# Patient Record
Sex: Female | Born: 1980 | Race: Black or African American | Hispanic: No | Marital: Single | Smoking: Never smoker
Health system: Southern US, Community
[De-identification: ages and names within clinical notes are randomized; demographics above are authoritative.]

## PROBLEM LIST (undated history)

## (undated) DIAGNOSIS — C19 Malignant neoplasm of rectosigmoid junction: Secondary | ICD-10-CM

## (undated) DIAGNOSIS — Z808 Family history of malignant neoplasm of other organs or systems: Secondary | ICD-10-CM

## (undated) DIAGNOSIS — F32A Depression, unspecified: Secondary | ICD-10-CM

## (undated) DIAGNOSIS — Z9889 Other specified postprocedural states: Secondary | ICD-10-CM

## (undated) DIAGNOSIS — M719 Bursopathy, unspecified: Secondary | ICD-10-CM

## (undated) DIAGNOSIS — K219 Gastro-esophageal reflux disease without esophagitis: Secondary | ICD-10-CM

## (undated) DIAGNOSIS — Z800 Family history of malignant neoplasm of digestive organs: Secondary | ICD-10-CM

## (undated) DIAGNOSIS — Z803 Family history of malignant neoplasm of breast: Secondary | ICD-10-CM

## (undated) DIAGNOSIS — F419 Anxiety disorder, unspecified: Secondary | ICD-10-CM

## (undated) HISTORY — PX: LEEP: SHX91

## (undated) HISTORY — DX: Family history of malignant neoplasm of other organs or systems: Z80.8

## (undated) HISTORY — DX: Family history of malignant neoplasm of digestive organs: Z80.0

## (undated) HISTORY — DX: Family history of malignant neoplasm of breast: Z80.3

---
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
## 2004-12-06 ENCOUNTER — Emergency Department (HOSPITAL_COMMUNITY): Admission: EM | Admit: 2004-12-06 | Discharge: 2004-12-06 | Payer: Self-pay | Admitting: Emergency Medicine
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
## 2007-03-15 ENCOUNTER — Emergency Department (HOSPITAL_COMMUNITY): Admission: EM | Admit: 2007-03-15 | Discharge: 2007-03-15 | Payer: Self-pay | Admitting: Emergency Medicine

## 2017-10-18 DIAGNOSIS — L74510 Primary focal hyperhidrosis, axilla: Secondary | ICD-10-CM | POA: Diagnosis not present

## 2017-11-22 DIAGNOSIS — Z30432 Encounter for removal of intrauterine contraceptive device: Secondary | ICD-10-CM | POA: Diagnosis not present

## 2017-11-22 DIAGNOSIS — Z01419 Encounter for gynecological examination (general) (routine) without abnormal findings: Secondary | ICD-10-CM | POA: Diagnosis not present

## 2017-11-22 DIAGNOSIS — N898 Other specified noninflammatory disorders of vagina: Secondary | ICD-10-CM | POA: Diagnosis not present

## 2017-11-22 DIAGNOSIS — Z1151 Encounter for screening for human papillomavirus (HPV): Secondary | ICD-10-CM | POA: Diagnosis not present

## 2017-11-22 DIAGNOSIS — Z6829 Body mass index (BMI) 29.0-29.9, adult: Secondary | ICD-10-CM | POA: Diagnosis not present

## 2018-05-23 ENCOUNTER — Encounter (HOSPITAL_COMMUNITY): Payer: Self-pay

## 2018-05-23 ENCOUNTER — Other Ambulatory Visit: Payer: Self-pay

## 2018-05-23 ENCOUNTER — Ambulatory Visit (HOSPITAL_COMMUNITY)
Admission: EM | Admit: 2018-05-23 | Discharge: 2018-05-23 | Disposition: A | Discharge status: Home / Self Care | Payer: 59

## 2018-05-23 DIAGNOSIS — J302 Other seasonal allergic rhinitis: Secondary | ICD-10-CM

## 2018-05-23 DIAGNOSIS — H6122 Impacted cerumen, left ear: Secondary | ICD-10-CM | POA: Diagnosis not present

## 2018-05-23 NOTE — ED Triage Notes (Signed)
Patient presents to Urgent Care with complaints of ear fullness since about a week ago. Patient states she usually gets bad sinus infections, has not taken meds for symptoms.

## 2018-05-23 NOTE — ED Provider Notes (Signed)
[place]    CSN: [id number] Arrival date & time: 05/23/18  1642     History   Chief Complaint Chief Complaint  Patient presents with  . Appointment  . Ear Fullness    HPI Sharon Bennett is a 38 y.o. female.   HPI Patient presents today with a complaint of bilateral ear fullness and ear pressure greater in right ear compared to left. She has a history of seasonal allergies and in the past has taken Zyrtec and Allegra. Currently, she is not currently taking antihistamines for allergy symptoms. Denies headache, facial pressure, nasal drainage, fever, or feeling poorly.  History reviewed. No pertinent past medical history.  There are no active problems to display for this patient.  History reviewed. No pertinent surgical history.  OB History   No obstetric history on file.      Home Medications    Prior to Admission medications   Medication Sig Start Date End Date Taking? Authorizing Provider  norethindrone-ethinyl estradiol (LOESTRIN FE) 1-20 MG-MCG tablet Take 1 tablet by mouth daily.   Yes [provider]    Family History Family History  Problem Relation Age of Onset  . Hypertension Mother   . Heart failure Father   . Hypertension Father     Social History Social History   Tobacco Use  . Smoking status: Never Smoker  . Smokeless tobacco: Never Used  Substance Use Topics  . Alcohol use: Not on file    Comment: occasionally  . Drug use: Not on file     Allergies   Patient has no known allergies.   Review of Systems Review of Systems Pertinent negatives listed in HPI Physical Exam Triage Vital Signs ED Triage Vitals  Enc Vitals Group     BP 05/23/18 1720 134/86     Pulse Rate 05/23/18 1720 83     Resp 05/23/18 1720 17     Temp 05/23/18 1720 98.5 F (36.9 C)     Temp Source 05/23/18 1720 Oral     SpO2 05/23/18 1720 100 %     Weight --      Height --      Head Circumference --      Peak Flow --      Pain Score  05/23/18 1716 0     Pain Loc --      Pain Edu? --      Excl. in [place]? --    No data found.  Updated Vital Signs BP 134/86 (BP Location: Left Arm)   Pulse 83   Temp 98.5 F (36.9 C) (Oral)   Resp 17   SpO2 100%   Visual Acuity Right Eye Distance:   Left Eye Distance:   Bilateral Distance:    Right Eye Near:   Left Eye Near:    Bilateral Near:     Physical Exam Vitals signs reviewed.  Constitutional:      Appearance: Normal appearance.  HENT:     Right Ear: Hearing, ear canal and external ear normal. A middle ear effusion is present.     Left Ear: Hearing and external ear normal. There is impacted cerumen.  Cardiovascular:     Rate and Rhythm: Normal rate and regular rhythm.  Pulmonary:     Effort: Pulmonary effort is normal.     Breath sounds: Normal breath sounds.  Musculoskeletal: Normal range of motion.  Psychiatric:        Mood and Affect: Mood normal.  Behavior: Behavior is cooperative.    UC Treatments / Results  Labs (all labs ordered are listed, but only abnormal results are displayed) Labs Reviewed - No data to display  EKG None  Radiology No results found.  Procedures Procedures (including critical care time)  Medications Ordered in UC Medications - No data to display  Initial Impression / Assessment and Plan / UC Course  I have reviewed the triage vital signs and the nursing notes.  Pertinent labs & imaging results that were available during my care of the patient were reviewed by me and considered in my medical decision making (see chart for details).    Left ear cerumen impaction. Cerumen irrigation performed and patient tolerated without issue and reports resolution of symptoms. No evidence of infection of the left ear. Patient encouraged to resume oral antihistamines for management of allergy related symptoms. Final Clinical Impressions(s) / UC Diagnoses   Final diagnoses:  Impacted cerumen of left ear  Seasonal allergies      Discharge Instructions     Resume daily antihistamine therapy of your choice to reduce symptoms of ear pressure.      ED Prescriptions    None     Controlled Substance Prescriptions [place] Controlled Substance Registry consulted? Not Applicable   Scot Jun, Harold 05/23/18 [phone]

## 2018-05-23 NOTE — Discharge Instructions (Addendum)
Resume daily antihistamine therapy of your choice to reduce symptoms of ear pressure.

## 2018-11-17 DIAGNOSIS — Z01419 Encounter for gynecological examination (general) (routine) without abnormal findings: Secondary | ICD-10-CM | POA: Diagnosis not present

## 2018-11-17 DIAGNOSIS — Z0000 Encounter for general adult medical examination without abnormal findings: Secondary | ICD-10-CM | POA: Diagnosis not present

## 2018-11-17 DIAGNOSIS — Z1329 Encounter for screening for other suspected endocrine disorder: Secondary | ICD-10-CM | POA: Diagnosis not present

## 2018-11-17 DIAGNOSIS — N72 Inflammatory disease of cervix uteri: Secondary | ICD-10-CM | POA: Diagnosis not present

## 2018-11-17 DIAGNOSIS — Z131 Encounter for screening for diabetes mellitus: Secondary | ICD-10-CM | POA: Diagnosis not present

## 2018-11-17 DIAGNOSIS — B977 Papillomavirus as the cause of diseases classified elsewhere: Secondary | ICD-10-CM | POA: Diagnosis not present

## 2018-11-17 DIAGNOSIS — Z13220 Encounter for screening for lipoid disorders: Secondary | ICD-10-CM | POA: Diagnosis not present

## 2018-11-17 DIAGNOSIS — Z130 Encounter for screening for diseases of the blood and blood-forming organs and certain disorders involving the immune mechanism: Secondary | ICD-10-CM | POA: Diagnosis not present

## 2018-11-17 DIAGNOSIS — Z6825 Body mass index (BMI) 25.0-25.9, adult: Secondary | ICD-10-CM | POA: Diagnosis not present

## 2019-01-03 ENCOUNTER — Other Ambulatory Visit: Payer: Self-pay

## 2019-01-03 ENCOUNTER — Ambulatory Visit (HOSPITAL_COMMUNITY)
Admission: EM | Admit: 2019-01-03 | Discharge: 2019-01-03 | Disposition: A | Discharge status: Home / Self Care | Payer: 59

## 2019-01-03 ENCOUNTER — Encounter (HOSPITAL_COMMUNITY): Payer: Self-pay

## 2019-01-03 DIAGNOSIS — T300 Burn of unspecified body region, unspecified degree: Secondary | ICD-10-CM | POA: Diagnosis not present

## 2019-01-03 MED ORDER — SILVER SULFADIAZINE 1 % EX CREA
TOPICAL_CREAM | CUTANEOUS | Status: AC
  Filled 2019-01-03: qty 85

## 2019-01-03 MED ORDER — SILVER SULFADIAZINE 1 % EX CREA
TOPICAL_CREAM | Freq: Once | CUTANEOUS | Status: AC
  Administered 2019-01-03: 16:00:00 via TOPICAL

## 2019-01-03 MED ORDER — SILVER SULFADIAZINE 1 % EX CREA: 1.0000 "application " | TOPICAL_CREAM | Freq: Two times a day (BID) | CUTANEOUS | 0 refills | Status: DC
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
## 2019-01-03 NOTE — Discharge Instructions (Addendum)
Thin amount of silvadene twice a day.  Wash with gentle soap and water daily, skin will eventually slough off which will be good for this as well.  Likely will take a few weeks for this to heal.  Please return for any concerns of infection or any worsening of symptoms.  Follow up with plastics as needed.
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
## 2019-06-02 MED FILL — LARIN 21 1-20 TABLET: 1-20 | 84 days supply | Qty: 84 | Fill #0

## 2019-08-24 MED FILL — LARIN 21 1-20 TABLET: 1-20 | 42 days supply | Qty: 42 | Fill #1

## 2019-09-02 ENCOUNTER — Other Ambulatory Visit: Payer: Self-pay

## 2019-09-02 ENCOUNTER — Encounter (HOSPITAL_COMMUNITY): Payer: Self-pay

## 2019-09-02 ENCOUNTER — Ambulatory Visit (HOSPITAL_COMMUNITY)
Admission: RE | Admit: 2019-09-02 | Discharge: 2019-09-02 | Disposition: A | Discharge status: Home / Self Care | Payer: No Typology Code available for payment source | Source: Ambulatory Visit | Attending: Family Medicine | Admitting: Family Medicine

## 2019-09-02 VITALS — BP 138/78 | HR 81 | Temp 98.9°F | Resp 16

## 2019-09-02 DIAGNOSIS — L509 Urticaria, unspecified: Secondary | ICD-10-CM | POA: Diagnosis not present

## 2019-09-02 MED ORDER — PREDNISONE 10 MG (21) PO TBPK: ORAL_TABLET | Freq: Every day | ORAL | 0 refills | Status: DC

## 2019-09-02 MED FILL — predniSONE 10 MG TABS: 10 | 6 days supply | Qty: 21 | Fill #0

## 2019-09-02 NOTE — ED Provider Notes (Signed)
  [facility]   [id number] 09/02/19 Arrival Time: [place]:  1. Urticaria     Unclear trigger.  Begin: Meds ordered this encounter  Medications  . predniSONE (STERAPRED UNI-PAK 21 TAB) 10 MG (21) TBPK tablet    Sig: Take by mouth daily. Take as directed.    Dispense:  21 tablet    Refill:  0    Benadryl if needed.  Will follow up with PCP or here if worsening or failing to improve as anticipated. Reviewed expectations re: course of current medical issues. Questions answered. Outlined signs and symptoms indicating need for more acute intervention. Patient verbalized understanding. After Visit Summary given.   SUBJECTIVE:  Sharon Bennett is a 39 y.o. female who reports hives; abrupt onset 2 d ago; diffuse; significant itching; afebrile. H/O similar in past. Unknown trigger.  No resp or swallowing difficulties.   OBJECTIVE: Vitals:   09/02/19 1626  BP: (!) 138/78  Pulse: 81  Resp: 16  Temp: 98.9 F (37.2 C)  TempSrc: Oral  SpO2: 100%    General appearance: alert; no distress HEENT: [place]; AT Neck: supple with FROM Lungs: unlabored Extremities: no edema; moves all extremities normally Skin: warm and dry; smooth, slightly elevated and erythematous plaques of variable size over her face, trunk, and extremities Psychological: alert and cooperative; normal mood and affect  No Known Allergies  History reviewed. No pertinent past medical history. Social History   Socioeconomic History  . Marital status: Single    Spouse name: Not on file  . Number of children: Not on file  . Years of education: Not on file  . Highest education level: Not on file  Occupational History  . Not on file  Tobacco Use  . Smoking status: Never Smoker  . Smokeless tobacco: Never Used  Vaping Use  . Vaping Use: Never used  Substance and Sexual Activity  . Alcohol use: Not on file    Comment: occasionally  . Drug use: Not on file  . Sexual activity: Not on  file  Other Topics Concern  . Not on file  Social History Narrative  . Not on file   Social Determinants of Health   Financial Resource Strain:   . Difficulty of Paying Living Expenses:   Food Insecurity:   . Worried About Charity fundraiser in the Last Year:   . Arboriculturist in the Last Year:   Transportation Needs:   . Film/video editor (Medical):   Marland Kitchen Lack of Transportation (Non-Medical):   Physical Activity:   . Days of Exercise per Week:   . Minutes of Exercise per Session:   Stress:   . Feeling of Stress :   Social Connections:   . Frequency of Communication with Friends and Family:   . Frequency of Social Gatherings with Friends and Family:   . Attends Religious Services:   . Active Member of Clubs or Organizations:   . Attends Archivist Meetings:   Marland Kitchen Marital Status:   Intimate Partner Violence:   . Fear of Current or Ex-Partner:   . Emotionally Abused:   Marland Kitchen Physically Abused:   . Sexually Abused:    Family History  Problem Relation Age of Onset  . Hypertension Mother   . Heart failure Father   . Hypertension Father    History reviewed. No pertinent surgical history.   Vanessa Kick, MD 09/02/19 1659

## 2019-09-02 NOTE — ED Triage Notes (Signed)
Pt presents with hives x 2 days. Reports she is not sure what she is having a reaction to. Reports history of same with relief with prednisone. Hives are on her arms, back, stomach, legs, feet, hands and face.

## 2019-10-13 MED FILL — LARIN 21 1-20 TABLET: 1-20 | 63 days supply | Qty: 63 | Fill #0

## 2019-11-23 ENCOUNTER — Other Ambulatory Visit (HOSPITAL_COMMUNITY): Payer: Self-pay | Admitting: Obstetrics and Gynecology

## 2019-12-02 MED FILL — JUNEL 1-20 TABLET: 1-20 | 84 days supply | Qty: 84 | Fill #0

## 2020-02-07 ENCOUNTER — Ambulatory Visit (HOSPITAL_COMMUNITY): Payer: Self-pay

## 2020-08-21 ENCOUNTER — Emergency Department (HOSPITAL_BASED_OUTPATIENT_CLINIC_OR_DEPARTMENT_OTHER): Payer: 59

## 2020-08-21 ENCOUNTER — Encounter (HOSPITAL_BASED_OUTPATIENT_CLINIC_OR_DEPARTMENT_OTHER): Payer: Self-pay | Admitting: Emergency Medicine

## 2020-08-21 ENCOUNTER — Emergency Department (HOSPITAL_BASED_OUTPATIENT_CLINIC_OR_DEPARTMENT_OTHER)
Admission: EM | Admit: 2020-08-21 | Discharge: 2020-08-21 | Disposition: A | Discharge status: Home / Self Care | Payer: 59 | Attending: Emergency Medicine | Admitting: Emergency Medicine

## 2020-08-21 ENCOUNTER — Other Ambulatory Visit: Payer: Self-pay

## 2020-08-21 DIAGNOSIS — N200 Calculus of kidney: Secondary | ICD-10-CM | POA: Diagnosis not present

## 2020-08-21 DIAGNOSIS — R519 Headache, unspecified: Secondary | ICD-10-CM | POA: Diagnosis not present

## 2020-08-21 DIAGNOSIS — K6289 Other specified diseases of anus and rectum: Secondary | ICD-10-CM | POA: Insufficient documentation

## 2020-08-21 DIAGNOSIS — K625 Hemorrhage of anus and rectum: Secondary | ICD-10-CM | POA: Diagnosis not present

## 2020-08-21 DIAGNOSIS — D259 Leiomyoma of uterus, unspecified: Secondary | ICD-10-CM | POA: Diagnosis not present

## 2020-08-21 LAB — URINALYSIS, ROUTINE W REFLEX MICROSCOPIC
Bilirubin Urine: NEGATIVE
Glucose, UA: NEGATIVE mg/dL
Ketones, ur: 15 mg/dL — AB
Nitrite: NEGATIVE
Protein, ur: 30 mg/dL — AB
Specific Gravity, Urine: 1.025 (ref 1.005–1.030)
pH: 6.5 (ref 5.0–8.0)

## 2020-08-21 LAB — COMPREHENSIVE METABOLIC PANEL
ALT: 11 U/L (ref 0–44)
AST: 14 U/L — ABNORMAL LOW (ref 15–41)
Albumin: 4.5 g/dL (ref 3.5–5.0)
Alkaline Phosphatase: 60 U/L (ref 38–126)
Anion gap: 11 (ref 5–15)
BUN: 9 mg/dL (ref 6–20)
CO2: 25 mmol/L (ref 22–32)
Calcium: 8.9 mg/dL (ref 8.9–10.3)
Chloride: 100 mmol/L (ref 98–111)
Creatinine, Ser: 0.8 mg/dL (ref 0.44–1.00)
GFR, Estimated: >60 mL/min (ref >60)
Glucose, Bld: 81 mg/dL (ref 70–99)
Potassium: 3.6 mmol/L (ref 3.5–5.1)
Sodium: 136 mmol/L (ref 135–145)
Total Bilirubin: 0.4 mg/dL (ref 0.3–1.2)
Total Protein: 7.9 g/dL (ref 6.5–8.1)

## 2020-08-21 LAB — CBC WITH DIFFERENTIAL/PLATELET
Abs Immature Granulocytes: 0.01 10*3/uL (ref 0.00–0.07)
Basophils Absolute: 0 10*3/uL (ref 0.0–0.1)
Basophils Relative: 1 %
Eosinophils Absolute: 0.2 10*3/uL (ref 0.0–0.5)
Eosinophils Relative: 3 %
HCT: 41.9 % (ref 36.0–46.0)
Hemoglobin: 13.6 g/dL (ref 12.0–15.0)
Immature Granulocytes: 0 %
Lymphocytes Relative: 41 %
Lymphs Abs: 2.9 10*3/uL (ref 0.7–4.0)
MCH: 27.8 pg (ref 26.0–34.0)
MCHC: 32.5 g/dL (ref 30.0–36.0)
MCV: 85.5 fL (ref 80.0–100.0)
Monocytes Absolute: 0.7 10*3/uL (ref 0.1–1.0)
Monocytes Relative: 10 %
Neutro Abs: 3.3 10*3/uL (ref 1.7–7.7)
Neutrophils Relative %: 45 %
Platelets: 319 10*3/uL (ref 150–400)
RBC: 4.9 MIL/uL (ref 3.87–5.11)
RDW: 13.4 % (ref 11.5–15.5)
WBC: 7.2 10*3/uL (ref 4.0–10.5)
nRBC: 0 % (ref 0.0–0.2)

## 2020-08-21 LAB — PREGNANCY, URINE: Preg Test, Ur: NEGATIVE

## 2020-08-21 LAB — C DIFFICILE QUICK SCREEN W PCR REFLEX
C Diff antigen: NEGATIVE
C Diff interpretation: NOT DETECTED
C Diff toxin: NEGATIVE

## 2020-08-21 LAB — OCCULT BLOOD X 1 CARD TO LAB, STOOL: Fecal Occult Bld: POSITIVE — AB

## 2020-08-21 IMAGING — CT CT ABD-PELV W/ CM
2 of 5 series · 15 of 46 positions shown, 17 images · IV contrast (APPLIED)
Comparison: No priors.

CLINICAL DATA: 40-year-old female with bright red blood in stools.
Suspected diverticulitis.

EXAM:
CT ABDOMEN AND PELVIS WITH CONTRAST
TECHNIQUE: Multidetector CT imaging of the abdomen and pelvis was performed
using the standard protocol following bolus administration of
intravenous contrast.
CONTRAST:  75mL OMNIPAQUE IOHEXOL 300 MG/ML  SOLN

[Series 2: abd pel w · axial · 0.71mm/px · z∈[+845,+1190]mm · 12 of 79 slices shown, 14 images]
[im 5/79  soft-tissue]
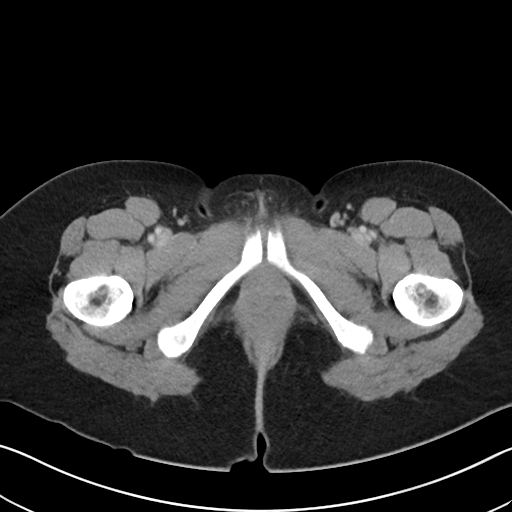
[im 5/79  bone]
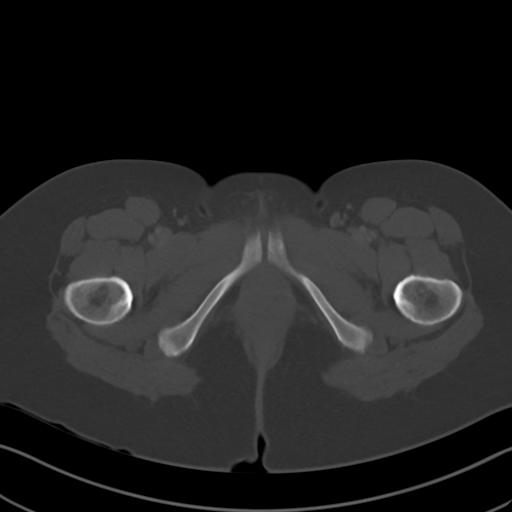
[im 13/79  soft-tissue]
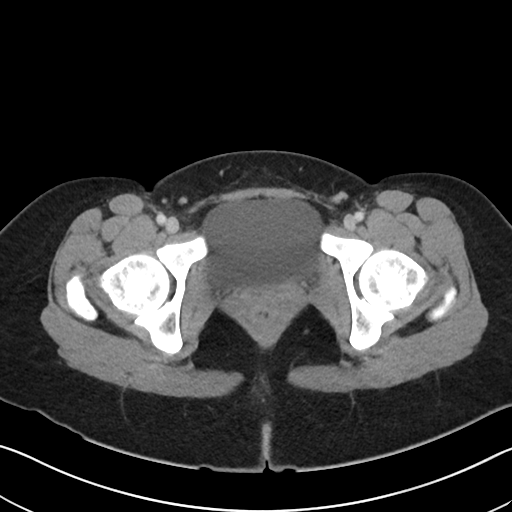
[im 17/79  soft-tissue]
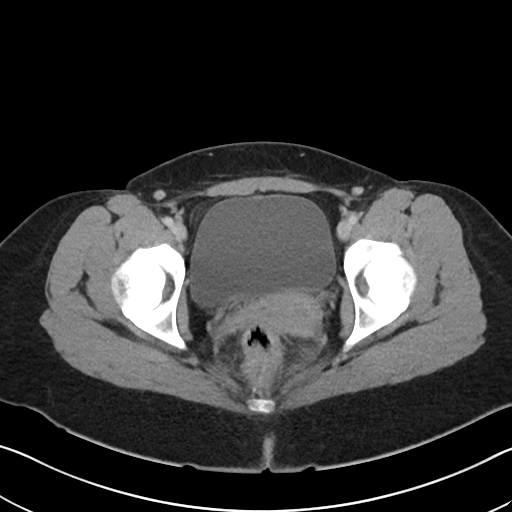
[im 25/79  soft-tissue]
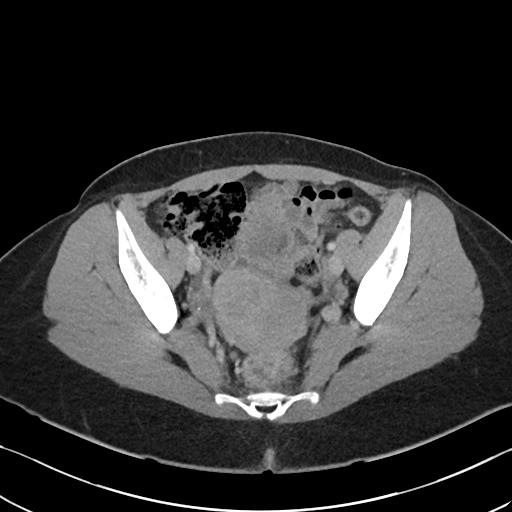
[im 29/79  soft-tissue]
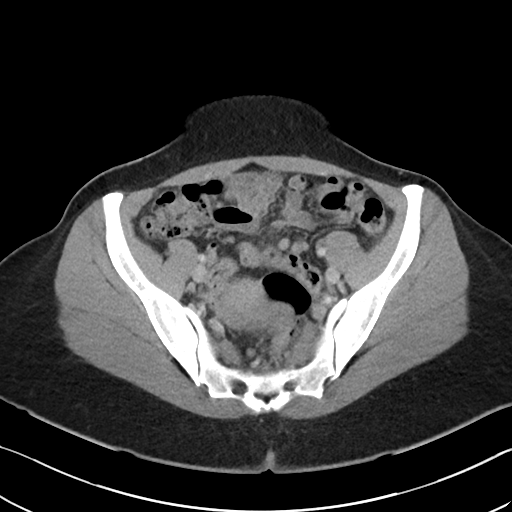
[im 37/79  soft-tissue]
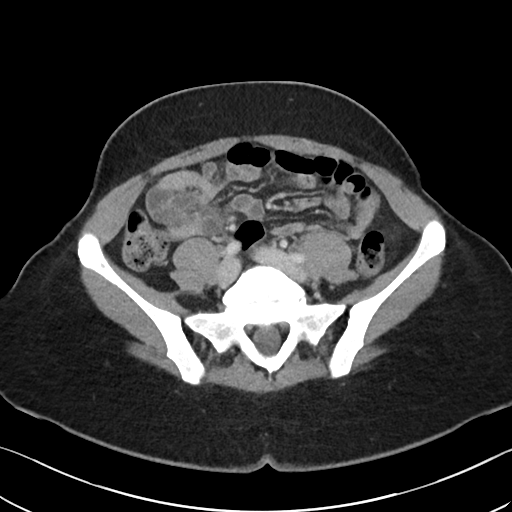
[im 42/79  soft-tissue]
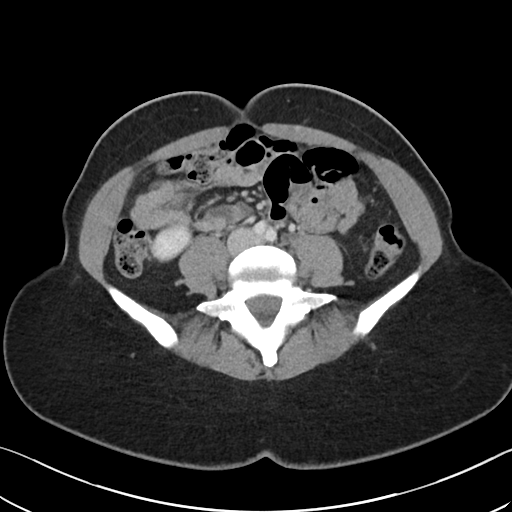
[im 50/79  soft-tissue]
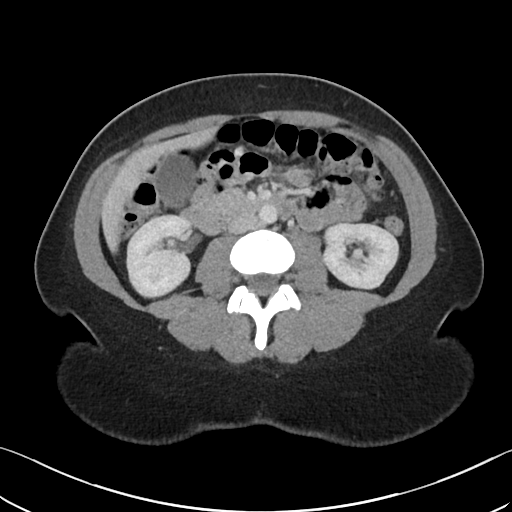
[im 54/79  soft-tissue]
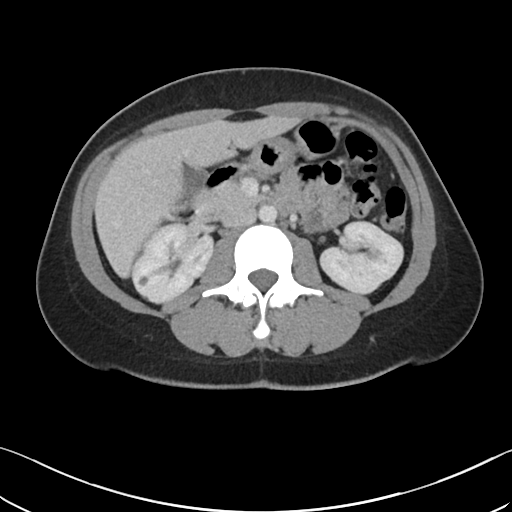
[im 54/79  bone]
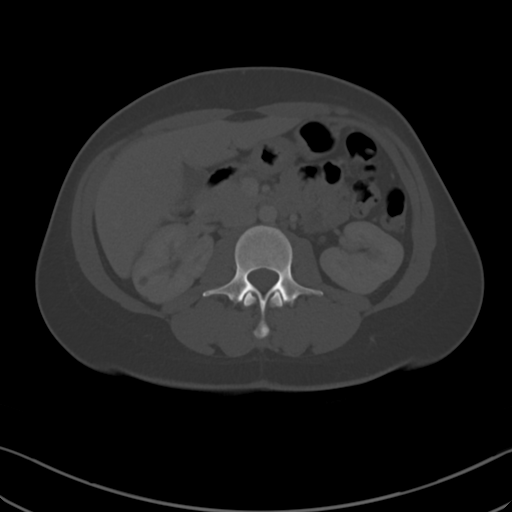
[im 62/79  soft-tissue]
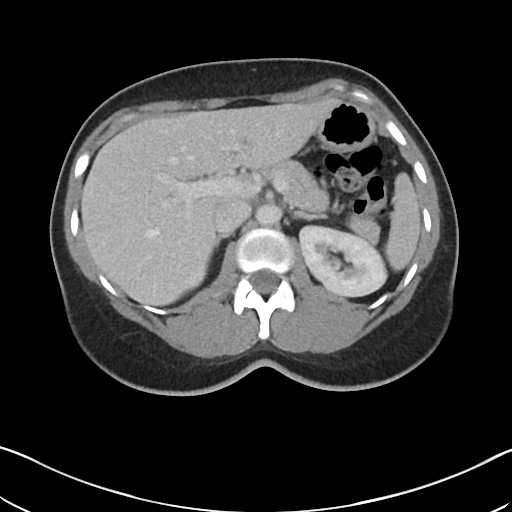
[im 66/79  soft-tissue]
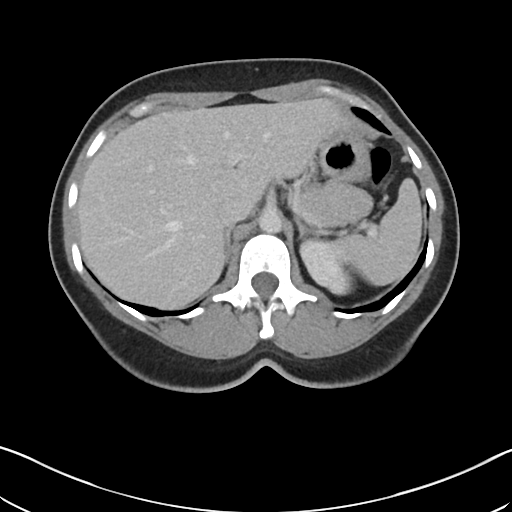
[im 74/79  soft-tissue]
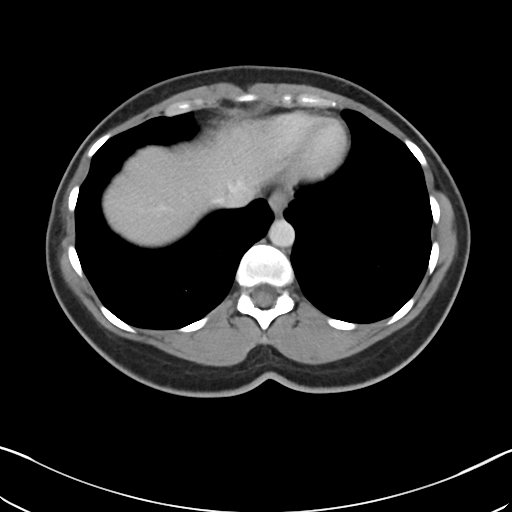

[Series 5: coronal · coronal · 0.77mm/px · 3 of 91 slices shown]
[im 31/91  soft-tissue]
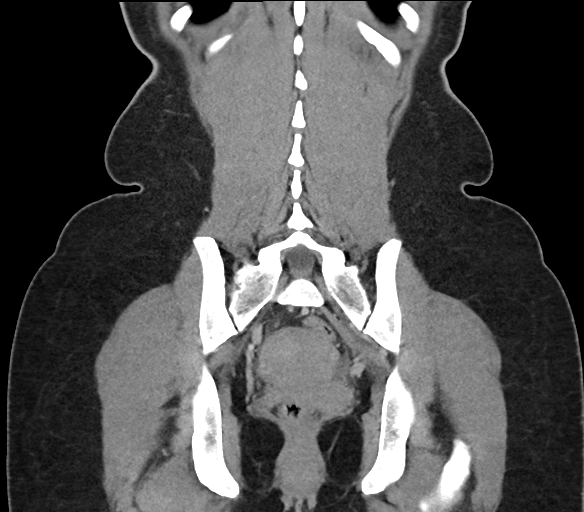
[im 41/91  soft-tissue]
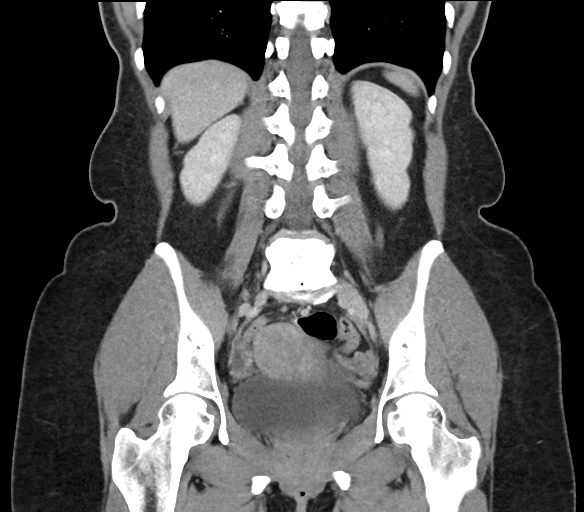
[im 51/91  soft-tissue]
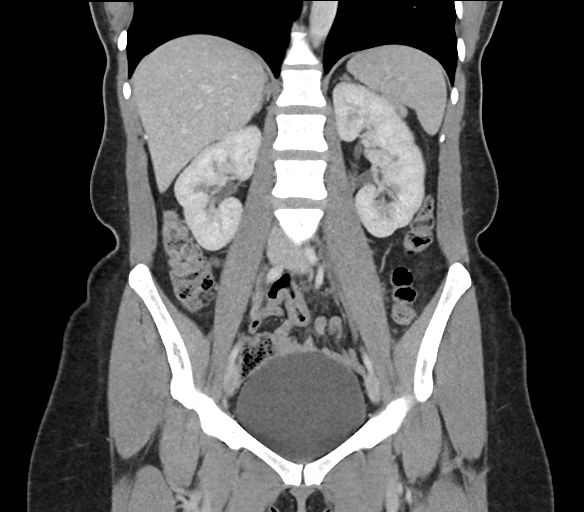

[15 of 46 positions shown; findings below may reference images not displayed]

FINDINGS: Lower chest: Unremarkable.

Hepatobiliary: No suspicious cystic or solid hepatic lesions. No
intra or extrahepatic biliary ductal dilatation. Gallbladder is
normal in appearance.

Pancreas: No pancreatic mass. No pancreatic ductal dilatation. No
pancreatic or peripancreatic fluid collections or inflammatory
changes.

Spleen: Unremarkable.

Adrenals/Urinary Tract: 2 mm nonobstructive calculus in the lower
pole collecting system of the right kidney. Subcentimeter
low-attenuation lesion in the upper pole of the right kidney, too
small to characterize, but statistically likely to represent a tiny
cyst. Left kidney and bilateral adrenal glands are normal in
appearance. No hydroureteronephrosis. Urinary bladder is normal in
appearance.

Stomach/Bowel: The appearance of the stomach is normal. There is no
pathologic dilatation of small bowel or colon. No significant
diverticular disease. Normal appendix. Large rectal mass estimated
to measure approximately 4.4 x 2.6 x 5.5 cm (axial image 60 of
series 2 and coronal image 23 of series 5). Haziness in the
mesorectal fat suggesting local infiltration of disease. Obscuration
of the fat plane between the mass and the adjacent posterior aspect
of the uterus. Numerous prominent mesorectal lymph nodes measuring
up to 5 mm in short axis, suspicious for nodal metastasis.

Vascular/Lymphatic: No significant atherosclerotic disease, aneurysm
or dissection noted in the abdominal or pelvic vasculature. As
mentioned above there are numerous prominent but nonenlarged
mesorectal lymph nodes suspicious for nodal metastasis given the
adjacent large rectal mass. No other lymphadenopathy noted elsewhere
in the abdomen or pelvis.

Reproductive: Uterus is heterogeneous in appearance with multiple
lesions, likely to represent small fibroids. Obscuration of the fat
plane between the posterior aspect of the uterine body and fundus
and the adjacent rectal mass. Ovaries are unremarkable in
appearance.

Other: No significant volume of ascites.  No pneumoperitoneum.

Musculoskeletal: There are no aggressive appearing lytic or blastic
lesions noted in the visualized portions of the skeleton.
IMPRESSION: 1. Large rectal mass estimated to measure approximately 4.4 x 2.6 x
5.5 cm highly suspicious for primary rectal neoplasm. There is
evidence of local invasion into the mesorectal soft tissues, the
lesion is intimately associated with the posterior wall of the
uterus with the intervening fat plane of completely obscured, and
there are several nonenlarged but suspicious mesorectal lymph nodes
concerning for local nodal metastases. Surgical consultation is
recommended.
2. 2 mm nonobstructive calculus in the lower pole collecting system
of the right kidney.
3. Fibroid uterus.

## 2020-08-21 MED ORDER — IOHEXOL 300 MG/ML  SOLN
75.0000 mL | Freq: Once | INTRAMUSCULAR | Status: AC | PRN
  Administered 2020-08-21: 75 mL via INTRAVENOUS

## 2020-08-21 MED ORDER — ACETAMINOPHEN 325 MG PO TABS
650.0000 mg | ORAL_TABLET | Freq: Once | ORAL | Status: AC
  Administered 2020-08-21: 650 mg via ORAL
  Filled 2020-08-21: qty 2

## 2020-08-21 NOTE — ED Provider Notes (Signed)
[facility] Provider Note   CSN: [id number] Arrival date & time: 08/21/20  0845     History Chief Complaint  Patient presents with   GI Problem    Sh[facility] is a 40 y.o. female.  She has no significant past medical history.  She said she struggles with constipation at times and uses MiraLAX intermittently.  She started feeling constipated a few weeks ago and noticed some blood when she passed stool.  Some of the bowl, mostly when she wiped.  She is now passing looser stool.  Feels like she is incompletely emptying.  She thinks maybe the stool was a little dark.  Has felt fatigued.  Little bit of a headache today and a low-grade temperature.  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  No cough shortness of breath urinary symptoms vaginal discharge.  She is on continuous contraception and she has noticed a few episodes of some spotting.  The history is provided by the patient.  GI Problem This is a new problem. The current episode started more than 1 week ago. The problem occurs daily. The problem has not changed since onset.Associated symptoms include abdominal pain (cramps intermittent) and headaches. Pertinent negatives include no chest pain and no shortness of breath. Nothing aggravates the symptoms. Relieved by: laxatives. She has tried rest for the symptoms. The treatment provided no relief.      History reviewed. No pertinent past medical history.  There are no problems to display for this patient.   History reviewed. No pertinent surgical history.   OB History   No obstetric history on file.     Family History  Problem Relation Age of Onset   Hypertension Mother    Heart failure Father    Hypertension Father     Social History   Tobacco Use   Smoking status: Never   Smokeless tobacco: Never  Vaping Use   Vaping Use: Never used    Home  Medications Prior to Admission medications   Medication Sig Start Date End Date Taking? Authorizing Provider  JUNEL 1/20 1-20 MG-MCG tablet Take 1 tablet by mouth daily. 12/19/18  Yes [provider]  norethindrone-ethinyl estradiol (LOESTRIN FE) 1-20 MG-MCG tablet Take 1 tablet by mouth daily.    [provider]  norethindrone-ethinyl estradiol (LOESTRIN) 1-20 MG-MCG tablet TAKE 1 TABLET BY MOUTH DAILY CONTINUOUS ACTIVE PILLS 11/23/19 11/22/20  Charyl Bigger, MD  predniSONE (STERAPRED UNI-PAK 21 TAB) 10 MG (21) TBPK tablet Take by mouth daily. Take as directed. 09/02/19   Vanessa Kick, MD  silver sulfADIAZINE (SILVADENE) 1 % cream Apply 1 application topically 2 (two) times daily. 01/03/19   Zigmund Gottron, NP    Allergies    Patient has no known allergies.  Review of Systems   Review of Systems  Constitutional:  Positive for fatigue and fever.  HENT:  Negative for sore throat.   Eyes:  Negative for visual disturbance.  Respiratory:  Negative for shortness of breath.   Cardiovascular:  Negative for chest pain.  Gastrointestinal:  Positive for abdominal pain (cramps intermittent), blood in stool, constipation, diarrhea and nausea.  Genitourinary:  Negative for dysuria.  Musculoskeletal:  Negative for neck pain.  Skin:  Negative for rash.  Neurological:  Positive for headaches.   Physical Exam Updated Vital Signs BP (!) 159/94 (BP Location: Right Arm)   Pulse (!) 102  Temp 99.2 F (37.3 C) (Oral)   Resp 16   SpO2 100%   Physical Exam Vitals and nursing note reviewed.  Constitutional:      General: She is not in acute distress.    Appearance: Normal appearance. She is well-developed.  HENT:     Head: Normocephalic and atraumatic.  Eyes:     Conjunctiva/sclera: Conjunctivae normal.  Cardiovascular:     Rate and Rhythm: Normal rate and regular rhythm.     Heart sounds: No murmur heard. Pulmonary:     Effort: Pulmonary effort is normal. No  respiratory distress.     Breath sounds: Normal breath sounds.  Abdominal:     Palpations: Abdomen is soft.     Tenderness: There is no abdominal tenderness. There is no guarding or rebound.  Musculoskeletal:        General: No deformity or signs of injury. Normal range of motion.     Cervical back: Neck supple.     Right lower leg: No edema.     Left lower leg: No edema.  Skin:    General: Skin is warm and dry.  Neurological:     General: No focal deficit present.     Mental Status: She is alert.    ED Results / Procedures / Treatments   Labs (all labs ordered are listed, but only abnormal results are displayed) Labs Reviewed  COMPREHENSIVE METABOLIC PANEL - Abnormal; Notable for the following components:      Result Value   AST 14 (*)    All other components within normal limits  OCCULT BLOOD X 1 CARD TO LAB, STOOL - Abnormal; Notable for the following components:   Fecal Occult Bld POSITIVE (*)    All other components within normal limits  URINALYSIS, ROUTINE W REFLEX MICROSCOPIC - Abnormal; Notable for the following components:   Hgb urine dipstick MODERATE (*)    Ketones, ur 15 (*)    Protein, ur 30 (*)    Leukocytes,Ua TRACE (*)    Bacteria, UA RARE (*)    All other components within normal limits  GASTROINTESTINAL PANEL BY PCR, STOOL (REPLACES STOOL CULTURE)  C DIFFICILE QUICK SCREEN W PCR REFLEX    CBC WITH DIFFERENTIAL/PLATELET  PREGNANCY, URINE    EKG None  Radiology CT Abdomen Pelvis W Contrast  Result Date: 08/21/2020 CLINICAL DATA:  40 year old female with bright red blood in stools. Suspected diverticulitis. EXAM: CT ABDOMEN AND PELVIS WITH CONTRAST TECHNIQUE: Multidetector CT imaging of the abdomen and pelvis was performed using the standard protocol following bolus administration of intravenous contrast. CONTRAST:  79mL OMNIPAQUE IOHEXOL 300 MG/ML  SOLN COMPARISON:  No priors. FINDINGS: Lower chest: Unremarkable. Hepatobiliary: No suspicious cystic or  solid hepatic lesions. No intra or extrahepatic biliary ductal dilatation. Gallbladder is normal in appearance. Pancreas: No pancreatic mass. No pancreatic ductal dilatation. No pancreatic or peripancreatic fluid collections or inflammatory changes. Spleen: Unremarkable. Adrenals/Urinary Tract: 2 mm nonobstructive calculus in the lower pole collecting system of the right kidney. Subcentimeter low-attenuation lesion in the upper pole of the right kidney, too small to characterize, but statistically likely to represent a tiny cyst. Left kidney and bilateral adrenal glands are normal in appearance. No hydroureteronephrosis. Urinary bladder is normal in appearance. Stomach/Bowel: The appearance of the stomach is normal. There is no pathologic dilatation of small bowel or colon. No significant diverticular disease. Normal appendix. Large rectal mass estimated to measure approximately 4.4 x 2.6 x 5.5 cm (axial image 60 of series 2 and  coronal image 23 of series 5). Haziness in the mesorectal fat suggesting local infiltration of disease. Obscuration of the fat plane between the mass and the adjacent posterior aspect of the uterus. Numerous prominent mesorectal lymph nodes measuring up to 5 mm in short axis, suspicious for nodal metastasis. Vascular/Lymphatic: No significant atherosclerotic disease, aneurysm or dissection noted in the abdominal or pelvic vasculature. As mentioned above there are numerous prominent but nonenlarged mesorectal lymph nodes suspicious for nodal metastasis given the adjacent large rectal mass. No other lymphadenopathy noted elsewhere in the abdomen or pelvis. Reproductive: Uterus is heterogeneous in appearance with multiple lesions, likely to represent small fibroids. Obscuration of the fat plane between the posterior aspect of the uterine body and fundus and the adjacent rectal mass. Ovaries are unremarkable in appearance. Other: No significant volume of ascites.  No pneumoperitoneum.  Musculoskeletal: There are no aggressive appearing lytic or blastic lesions noted in the visualized portions of the skeleton. IMPRESSION: 1. Large rectal mass estimated to measure approximately 4.4 x 2.6 x 5.5 cm highly suspicious for primary rectal neoplasm. There is evidence of local invasion into the mesorectal soft tissues, the lesion is intimately associated with the posterior wall of the uterus with the intervening fat plane of completely obscured, and there are several nonenlarged but suspicious mesorectal lymph nodes concerning for local nodal metastases. Surgical consultation is recommended. 2. 2 mm nonobstructive calculus in the lower pole collecting system of the right kidney. 3. Fibroid uterus. Electronically Signed   By: Vinnie Langton M.D.   On: 08/21/2020 11:17    Procedures Procedures   Medications Ordered in ED Medications - No data to display  ED Course  I have reviewed the triage vital signs and the nursing notes.  Pertinent labs & imaging results that were available during my care of the patient were reviewed by me and considered in my medical decision making (see chart for details).  Clinical Course as of 08/21/20 1721  Sun Aug 21, 2020  1250 Discussed with Dr. Donne Hazel general surgery.  He said she is going to need an urgent follow-up in the clinic and also need to see GI.  He asked for me to reach out to Dr. Benson Norway.  [MB]  [phone] Discussed with Dr. Benson Norway from GI.  He gave me his office phone number.  Have the patient call at 830 tomorrow he will fit her in tomorrow and anticipates doing a flex sig on Tuesday.  I reviewed this with the patient and she is comfortable with plan. [MB]    Clinical Course User Index [MB] Hayden Rasmussen, MD   MDM Rules/Calculators/A&P                         This patient complains of intermittent constipation and loose stools with some rectal bleeding; this involves an extensive number of treatment Options and is a complaint that carries with  it a high risk of complications and Morbidity. The differential includes hemorrhoids, diverticulitis, diverticulosis, constipation, mass, polyp  I ordered, reviewed and interpreted labs, which included CBC with normal white count normal hemoglobin, chemistries fairly unremarkable, LFTs normal, pregnancy test negative, fecal occult positive, stool sent for C. difficile and GI panel, urinalysis without clear signs of infection and not having any urinary symptoms I ordered medication Tylenol for headache I ordered imaging studies which included CT abdomen and pelvis with contrast and I independently    visualized and interpreted imaging which showed rectal mass  Previous  records obtained and reviewed in epic no recent visits I consulted Dr. Donne Hazel general surgery and Dr. Benson Norway GI and discussed lab and imaging findings  Critical Interventions: None  After the interventions stated above, I reevaluated the patient and found patient to be hemodynamically stable.  She understands the importance of close follow-up with GI tomorrow.  Return instructions discussed   Final Clinical Impression(s) / ED Diagnoses Final diagnoses:  Rectal mass  Rectal bleeding    Rx / DC Orders ED Discharge Orders     None        Hayden Rasmussen, MD 08/21/20 1724

## 2020-08-21 NOTE — Discharge Instructions (Addendum)
You were seen in the emergency department for rectal bleeding and constipation.  Your stool was positive for blood.  You had a CAT scan that is showing a rectal mass that will need urgent work-up.  Please call Dr. Benson Norway tomorrow morning to be seen tomorrow.  Return if any worsening or concerning symptoms

## 2020-08-21 NOTE — ED Triage Notes (Signed)
Bright red in stools occasionally (constipated) and then started with loose stools , and maybe the color was black/tarry.nauseated at times

## 2020-08-21 NOTE — ED Notes (Signed)
Patient transported to CT 

## 2020-08-22 DIAGNOSIS — K5900 Constipation, unspecified: Secondary | ICD-10-CM | POA: Diagnosis not present

## 2020-08-22 DIAGNOSIS — R194 Change in bowel habit: Secondary | ICD-10-CM | POA: Diagnosis not present

## 2020-08-22 DIAGNOSIS — K625 Hemorrhage of anus and rectum: Secondary | ICD-10-CM | POA: Diagnosis not present

## 2020-08-22 DIAGNOSIS — C20 Malignant neoplasm of rectum: Secondary | ICD-10-CM | POA: Diagnosis not present

## 2020-08-22 LAB — GASTROINTESTINAL PANEL BY PCR, STOOL (REPLACES STOOL CULTURE)

## 2020-08-23 DIAGNOSIS — K5900 Constipation, unspecified: Secondary | ICD-10-CM | POA: Diagnosis not present

## 2020-08-23 DIAGNOSIS — K625 Hemorrhage of anus and rectum: Secondary | ICD-10-CM | POA: Diagnosis not present

## 2020-08-23 DIAGNOSIS — C20 Malignant neoplasm of rectum: Secondary | ICD-10-CM | POA: Diagnosis not present

## 2020-08-23 DIAGNOSIS — R933 Abnormal findings on diagnostic imaging of other parts of digestive tract: Secondary | ICD-10-CM | POA: Diagnosis not present

## 2020-08-26 ENCOUNTER — Other Ambulatory Visit (HOSPITAL_COMMUNITY): Payer: Self-pay | Admitting: Gastroenterology

## 2020-08-26 ENCOUNTER — Telehealth: Payer: Self-pay | Admitting: Nurse Practitioner

## 2020-08-26 ENCOUNTER — Other Ambulatory Visit: Payer: Self-pay | Admitting: *Deleted

## 2020-08-26 DIAGNOSIS — C20 Malignant neoplasm of rectum: Secondary | ICD-10-CM

## 2020-08-26 NOTE — Progress Notes (Signed)
New Hematology/Oncology Consult   Requesting MD: Dr Carol Ada  [phone]  Reason for Consult: Rectal cancer  HPI: [facility] is a 40 year old woman who presented to the emergency department on 08/21/2020 with rectal bleeding, change in bowel habits.  Stool positive for occult blood.  CT abdomen/pelvis showed a large rectal mass measuring 4.4 x 2.6 x 5.5 cm, haziness in the mesorectal fat, obscuration of the fat plane between the mass and the adjacent posterior aspect of the uterus, numerous prominent mesorectal lymph nodes measuring up to 5 mm.  She was seen by Dr. Benson Norway on 08/22/2020, underwent a colonoscopy on 08/23/2020.  A fungating infiltrative nonobstructing large mass was found in the rectum.  Mass was noncircumferential, measured 5 cm in length, 3 cm in diameter and was 7 cm from the anal verge.  No bleeding was present.  Pathology showed invasive colonic adenocarcinoma, moderately differentiated.  No evidence of mismatch repair deficiency.  Past medical history:   Current Outpatient Medications:    JUNEL 1/20 1-20 MG-MCG tablet, Take 1 tablet by mouth daily., Disp: , Rfl: :   Allergies: Codeine, nausea/vomiting  FH: Mother is living, hypertension.  Father deceased age 88 with bilateral pneumonia.  Brother and sister both reported to be in good health.  Maternal grandfather with prostate cancer maternal great aunt with breast cancer.  SOCIAL HISTORY: She lives in [place].  She is single.  She has a son age 39 and daughter age 56.  Both in good health.  She works as a Marine scientist at [employer].  No tobacco use.  She rarely drinks alcohol.  Review of Systems: She reports a history of occasional constipation.  3 to 4 weeks ago she noted a small amount of blood with bowel movements.  2 weeks ago she noted a definite change in bowel habits, having 3-4 bowel movements a day.  She subsequently developed diarrhea.  She was concerned she may have C. difficile.  About 1 week before  going to the emergency department she noted stool was in "small pieces".  She has occasional nausea.  Overall good appetite.  No significant weight loss.  She feels "pressure" and some fecal urgency.  She has periodic headaches.  No fevers or sweats.  No urinary symptoms.  Recent increase in vaginal "spotting".  At nighttime she occasionally notes a crampy abdominal pain.  Recent "indigestion".  No dysphagia.  No cough or shortness of breath.  No chest pain.  Physical Exam:  Blood pressure 129/71, pulse 86, temperature 97.9 F (36.6 C), temperature source Temporal, resp. rate 18, weight 163 lb 6.4 oz (74.1 kg), SpO2 96 %.  HEENT: No thrush or ulcers.  Neck without mass. Lungs: Lungs clear bilaterally. Cardiac: Regular rate and rhythm. Abdomen: Abdomen soft and nontender.  No hepatomegaly.  No mass. Vascular: No leg edema. Lymph nodes: No palpable cervical, supraclavicular or inguinal lymph nodes.  Shotty bilateral axillary nodes. Neurologic: Alert and oriented.  Follows commands. Skin: No rash.  LABS: none  RADIOLOGY:  CT Abdomen Pelvis W Contrast  Result Date: 08/21/2020 CLINICAL DATA:  40 year old female with bright red blood in stools. Suspected diverticulitis. EXAM: CT ABDOMEN AND PELVIS WITH CONTRAST TECHNIQUE: Multidetector CT imaging of the abdomen and pelvis was performed using the standard protocol following bolus administration of intravenous contrast. CONTRAST:  49m OMNIPAQUE IOHEXOL 300 MG/ML  SOLN COMPARISON:  No priors. FINDINGS: Lower chest: Unremarkable. Hepatobiliary: No suspicious cystic or solid hepatic lesions. No intra or extrahepatic biliary ductal dilatation. Gallbladder is  normal in appearance. Pancreas: No pancreatic mass. No pancreatic ductal dilatation. No pancreatic or peripancreatic fluid collections or inflammatory changes. Spleen: Unremarkable. Adrenals/Urinary Tract: 2 mm nonobstructive calculus in the lower pole collecting system of the right kidney.  Subcentimeter low-attenuation lesion in the upper pole of the right kidney, too small to characterize, but statistically likely to represent a tiny cyst. Left kidney and bilateral adrenal glands are normal in appearance. No hydroureteronephrosis. Urinary bladder is normal in appearance. Stomach/Bowel: The appearance of the stomach is normal. There is no pathologic dilatation of small bowel or colon. No significant diverticular disease. Normal appendix. Large rectal mass estimated to measure approximately 4.4 x 2.6 x 5.5 cm (axial image 60 of series 2 and coronal image 23 of series 5). Haziness in the mesorectal fat suggesting local infiltration of disease. Obscuration of the fat plane between the mass and the adjacent posterior aspect of the uterus. Numerous prominent mesorectal lymph nodes measuring up to 5 mm in short axis, suspicious for nodal metastasis. Vascular/Lymphatic: No significant atherosclerotic disease, aneurysm or dissection noted in the abdominal or pelvic vasculature. As mentioned above there are numerous prominent but nonenlarged mesorectal lymph nodes suspicious for nodal metastasis given the adjacent large rectal mass. No other lymphadenopathy noted elsewhere in the abdomen or pelvis. Reproductive: Uterus is heterogeneous in appearance with multiple lesions, likely to represent small fibroids. Obscuration of the fat plane between the posterior aspect of the uterine body and fundus and the adjacent rectal mass. Ovaries are unremarkable in appearance. Other: No significant volume of ascites.  No pneumoperitoneum. Musculoskeletal: There are no aggressive appearing lytic or blastic lesions noted in the visualized portions of the skeleton. IMPRESSION: 1. Large rectal mass estimated to measure approximately 4.4 x 2.6 x 5.5 cm highly suspicious for primary rectal neoplasm. There is evidence of local invasion into the mesorectal soft tissues, the lesion is intimately associated with the posterior wall of  the uterus with the intervening fat plane of completely obscured, and there are several nonenlarged but suspicious mesorectal lymph nodes concerning for local nodal metastases. Surgical consultation is recommended. 2. 2 mm nonobstructive calculus in the lower pole collecting system of the right kidney. 3. Fibroid uterus. Electronically Signed   By: Vinnie Langton M.D.   On: 08/21/2020 11:17    Assessment and Plan:   Rectal cancer  CT abdomen/pelvis 08/21/2020-large rectal mass measuring 4.4 x 2.6 x 5.5 cm, haziness in the mesorectal fat, obscuration of the fat plane between the mass and the adjacent posterior aspect of the uterus, numerous prominent mesorectal lymph nodes measuring up to 5 mm Colonoscopy 08/23/2020-fungating infiltrative nonobstructing large mass found in the rectum.  Mass was noncircumferential, measured 5 cm in length, 3 cm in diameter and was 7 cm from the anal verge.  No bleeding was present.   Pathology showed invasive colonic adenocarcinoma, moderately differentiated.  No evidence of mismatch repair deficiency. Rectal bleeding, change in bowel habits secondary to #1  [facility] has been diagnosed with rectal cancer.  She presented with an approximate 1 month history of change in bowel habits and rectal bleeding.  She will complete staging studies this week to include pelvic MRI and chest CT.  We will see her in follow-up in 1 week to review those results and establish a treatment plan.  Patient seen with [name].      [name], NP 08/29/2020, 9:49 AM   This was a shared visit with [name].  Sharon [facility] was interviewed and examined.  We reviewed the staging CT abdomen/pelvis images with Sharon [facility].  She appears to have clinical stage III rectal cancer.  She has been referred for additional staging studies prior to a return office visit.  She will be referred for surgical and radiation oncology consults.  I was present for greater than 50% of today's visit.  I  performed medical decision making.  [name], MD

## 2020-08-26 NOTE — Telephone Encounter (Signed)
Scheduled appt per 7/22 staff msg from [facility]. Pt aware.

## 2020-08-26 NOTE — Progress Notes (Signed)
Per [name]: Ordered CT chest and MRI pelvis to complete her staging for new patient referral on 08/29/20. New patient scheduler notified.

## 2020-08-29 ENCOUNTER — Telehealth: Payer: Self-pay | Admitting: *Deleted

## 2020-08-29 ENCOUNTER — Inpatient Hospital Stay: Payer: 59 | Attending: Nurse Practitioner | Admitting: Nurse Practitioner

## 2020-08-29 ENCOUNTER — Telehealth: Payer: Self-pay | Admitting: Genetic Counselor

## 2020-08-29 ENCOUNTER — Other Ambulatory Visit: Payer: Self-pay

## 2020-08-29 ENCOUNTER — Encounter: Payer: Self-pay | Admitting: Nurse Practitioner

## 2020-08-29 ENCOUNTER — Ambulatory Visit (HOSPITAL_BASED_OUTPATIENT_CLINIC_OR_DEPARTMENT_OTHER): Payer: 59

## 2020-08-29 VITALS — BP 129/71 | HR 86 | Temp 97.9°F | Resp 18 | Wt 163.4 lb

## 2020-08-29 DIAGNOSIS — Z803 Family history of malignant neoplasm of breast: Secondary | ICD-10-CM

## 2020-08-29 DIAGNOSIS — C20 Malignant neoplasm of rectum: Secondary | ICD-10-CM

## 2020-08-29 DIAGNOSIS — R599 Enlarged lymph nodes, unspecified: Secondary | ICD-10-CM

## 2020-08-29 DIAGNOSIS — N926 Irregular menstruation, unspecified: Secondary | ICD-10-CM | POA: Diagnosis not present

## 2020-08-29 DIAGNOSIS — Z8042 Family history of malignant neoplasm of prostate: Secondary | ICD-10-CM | POA: Diagnosis not present

## 2020-08-29 NOTE — Progress Notes (Signed)
Call to Dr Ulyses Amor office for faxing CEA results to [facility] at [phone]

## 2020-08-29 NOTE — Telephone Encounter (Signed)
Scheduled appt per referral. Pt aware.  

## 2020-08-31 ENCOUNTER — Encounter (HOSPITAL_BASED_OUTPATIENT_CLINIC_OR_DEPARTMENT_OTHER): Payer: Self-pay

## 2020-08-31 ENCOUNTER — Other Ambulatory Visit: Payer: Self-pay

## 2020-08-31 ENCOUNTER — Ambulatory Visit (HOSPITAL_BASED_OUTPATIENT_CLINIC_OR_DEPARTMENT_OTHER)
Admission: RE | Admit: 2020-08-31 | Discharge: 2020-08-31 | Disposition: A | Discharge status: Home / Self Care | Payer: 59 | Source: Ambulatory Visit | Attending: Oncology | Admitting: Oncology

## 2020-08-31 DIAGNOSIS — C20 Malignant neoplasm of rectum: Secondary | ICD-10-CM | POA: Diagnosis not present

## 2020-08-31 DIAGNOSIS — C218 Malignant neoplasm of overlapping sites of rectum, anus and anal canal: Secondary | ICD-10-CM | POA: Diagnosis not present

## 2020-08-31 IMAGING — CT CT CHEST W/O CM
2 of 4 series · 15 of 36 positions shown, 18 images · non-contrast
Comparison: CT abdomen pelvis, [DATE]

CLINICAL DATA: Recent diagnosis rectal cancer, chest staging

EXAM:
CT CHEST WITHOUT CONTRAST
TECHNIQUE: Multidetector CT imaging of the chest was performed following the
standard protocol without IV contrast.

[Series 2: routine chest without · axial · non-contrast · 0.64mm/px · z∈[+1425,+1687]mm · 12 of 155 slices shown, 15 images]
[im 12/155  mediastinal]
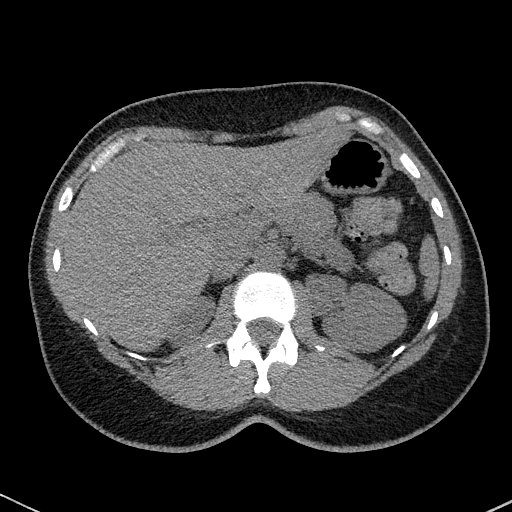
[im 12/155  lung]
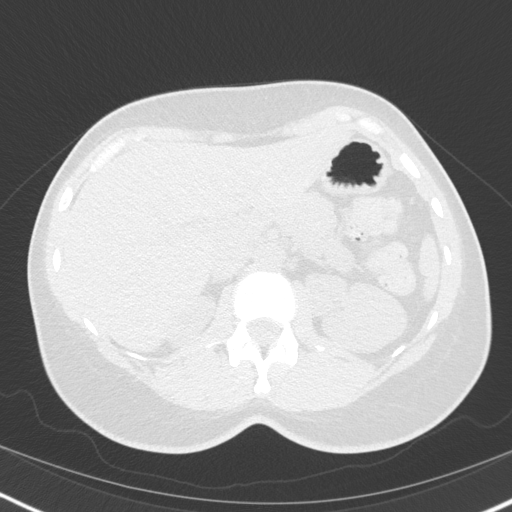
[im 24/155  lung]
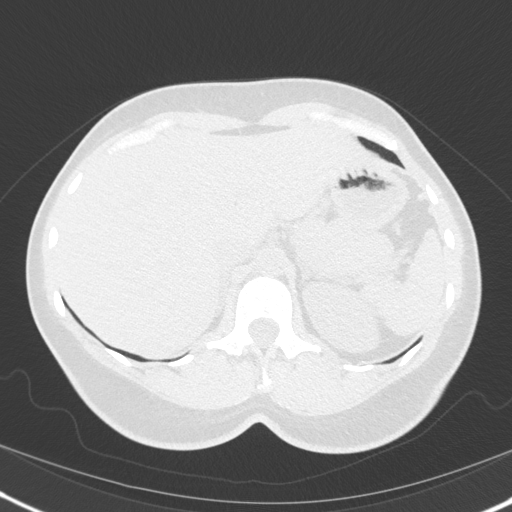
[im 36/155  lung]
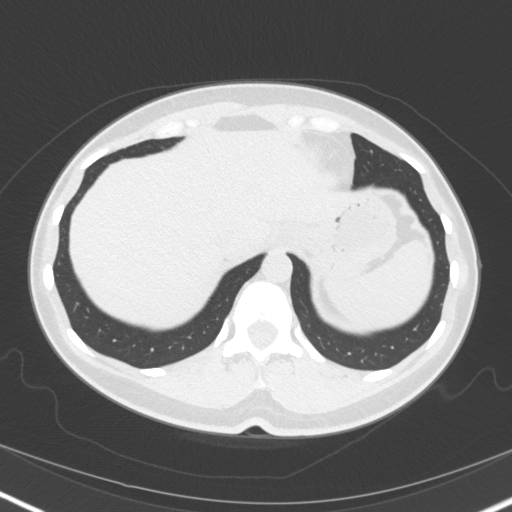
[im 48/155  lung]
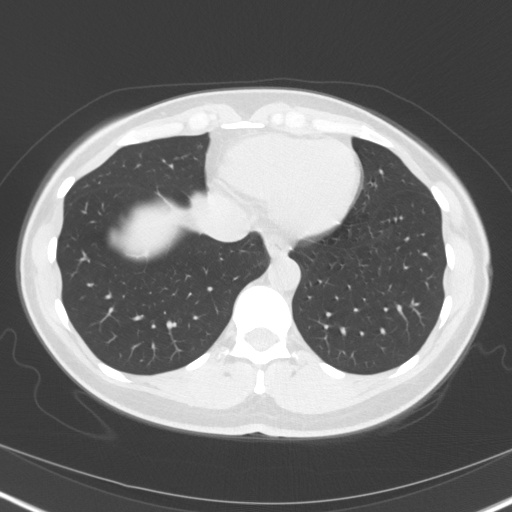
[im 60/155  mediastinal]
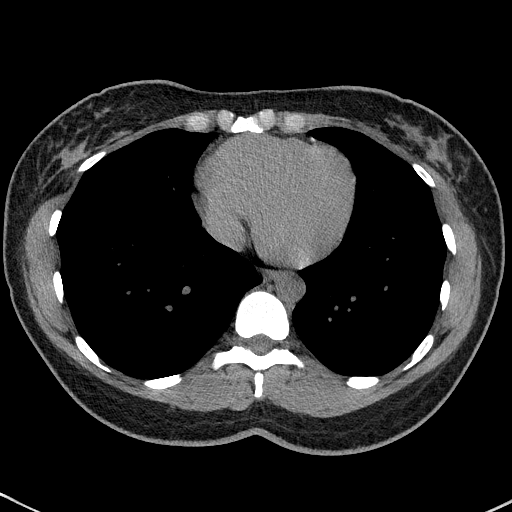
[im 60/155  lung]
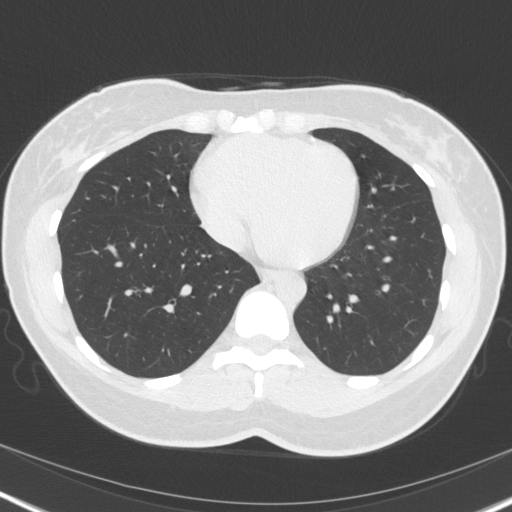
[im 72/155  lung]
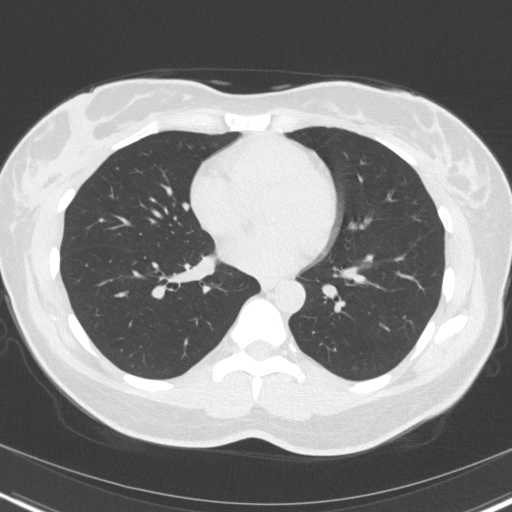
[im 83/155  lung]
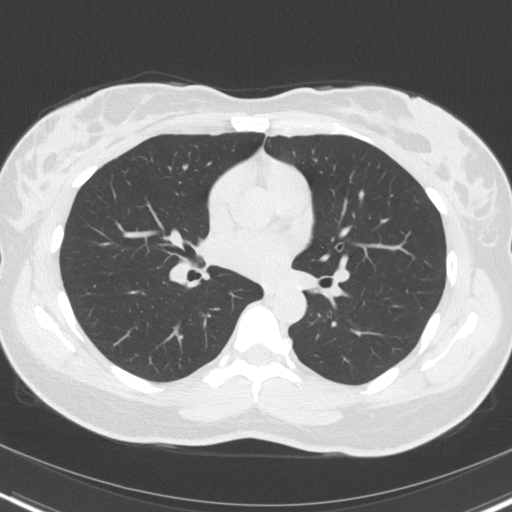
[im 95/155  lung]
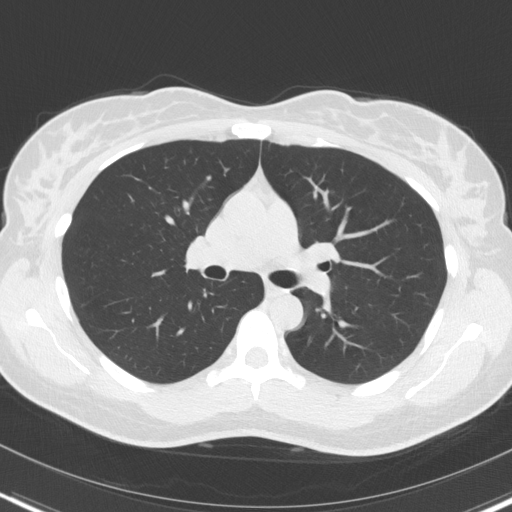
[im 107/155  mediastinal]
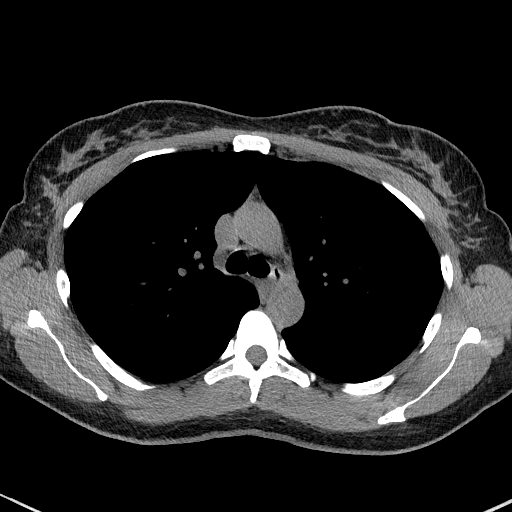
[im 107/155  lung]
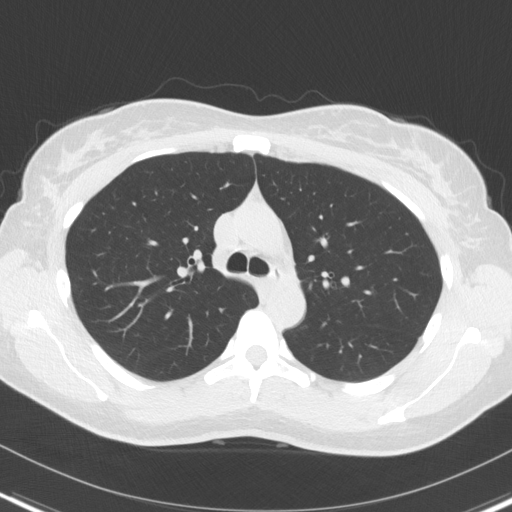
[im 119/155  lung]
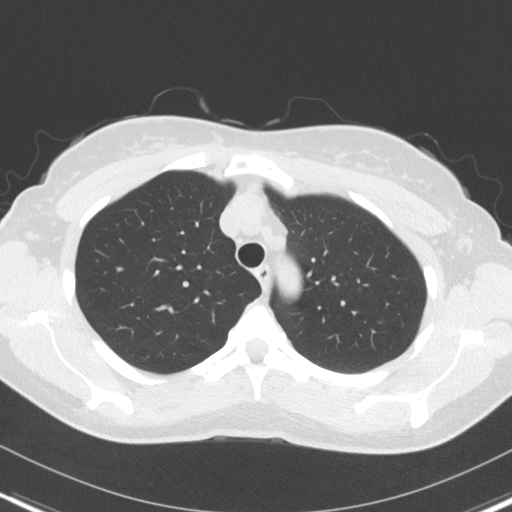
[im 131/155  lung]
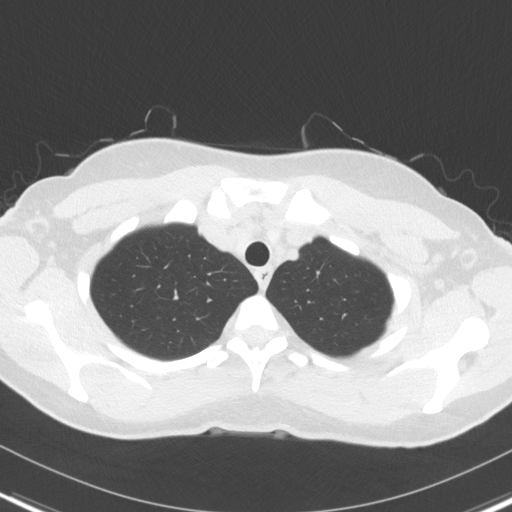
[im 143/155  lung]
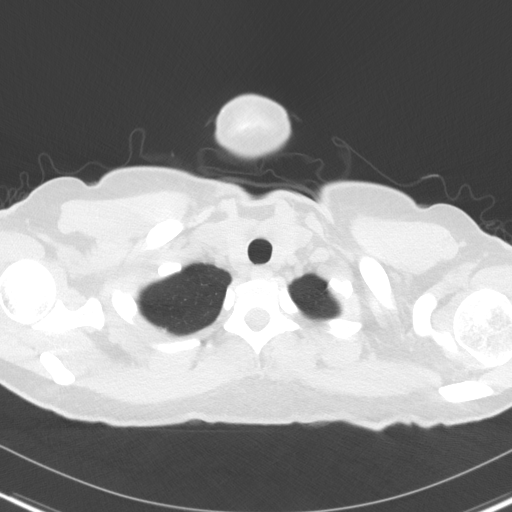

[Series 5: coronal · coronal · 0.60mm/px · 3 of 124 slices shown]
[im 25/124  lung]
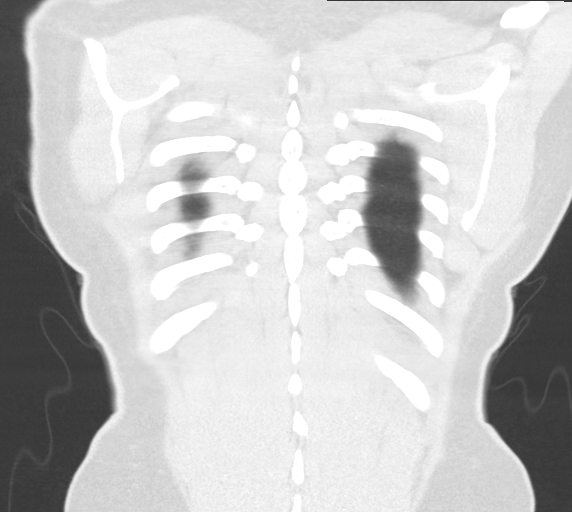
[im 50/124  lung]
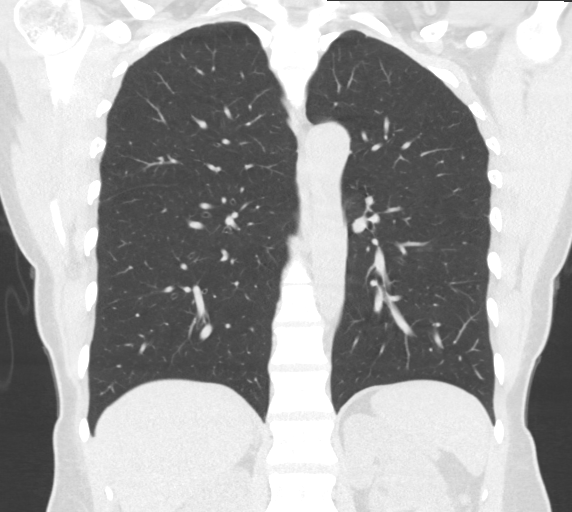
[im 74/124  lung]
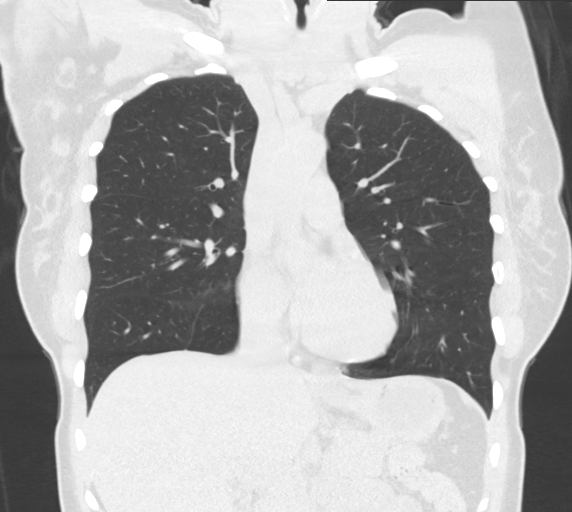

[15 of 36 positions shown; findings below may reference images not displayed]

FINDINGS: Cardiovascular: No significant vascular findings. Normal heart size.
No pericardial effusion.

Mediastinum/Nodes: No enlarged mediastinal, hilar, or axillary lymph
nodes. Thyroid gland, trachea, and esophagus demonstrate no
significant findings.

Lungs/Pleura: Lungs are clear. No pleural effusion or pneumothorax.

Upper Abdomen: No acute abnormality.

Musculoskeletal: No chest wall mass or suspicious bone lesions
identified.
IMPRESSION: No evidence of metastatic disease in the chest.

## 2020-09-02 ENCOUNTER — Other Ambulatory Visit: Payer: Self-pay

## 2020-09-02 ENCOUNTER — Other Ambulatory Visit (HOSPITAL_COMMUNITY): Payer: Self-pay | Admitting: Gastroenterology

## 2020-09-02 ENCOUNTER — Ambulatory Visit (HOSPITAL_COMMUNITY)
Admission: RE | Admit: 2020-09-02 | Discharge: 2020-09-02 | Disposition: A | Discharge status: Home / Self Care | Payer: 59 | Source: Ambulatory Visit | Attending: Gastroenterology | Admitting: Gastroenterology

## 2020-09-02 DIAGNOSIS — C20 Malignant neoplasm of rectum: Secondary | ICD-10-CM | POA: Diagnosis not present

## 2020-09-02 DIAGNOSIS — C218 Malignant neoplasm of overlapping sites of rectum, anus and anal canal: Secondary | ICD-10-CM | POA: Diagnosis not present

## 2020-09-02 IMAGING — MR MR PELVIS W/O CM
7 series · 46 of 48 positions shown · non-contrast
Comparison: None.

CLINICAL DATA: Rectal carcinoma.

EXAM:
MRI PELVIS WITHOUT CONTRAST
TECHNIQUE: Multiplanar multisequence MR imaging of the pelvis was performed. No
intravenous contrast was administered. Ultrasound gel was
administered per rectum to optimize tumor evaluation.

[Series 2: T2 · sagittal · 3.0mm · 0.74mm/px · 6 of 40 slices shown (1 of 5)]
[im 1/40]
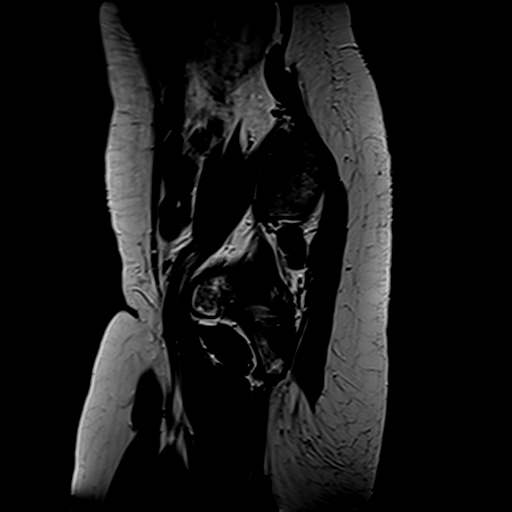
[im 8/40]
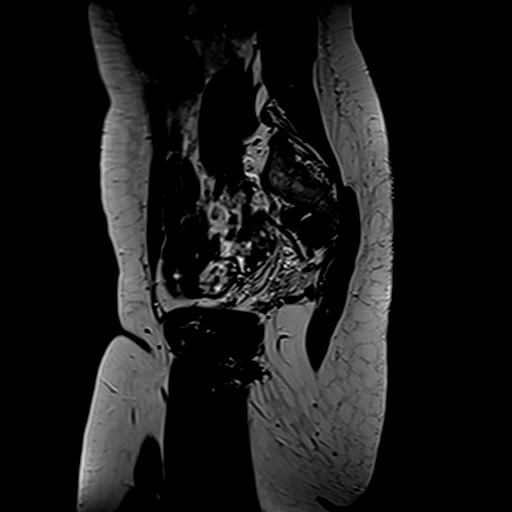
[im 16/40]
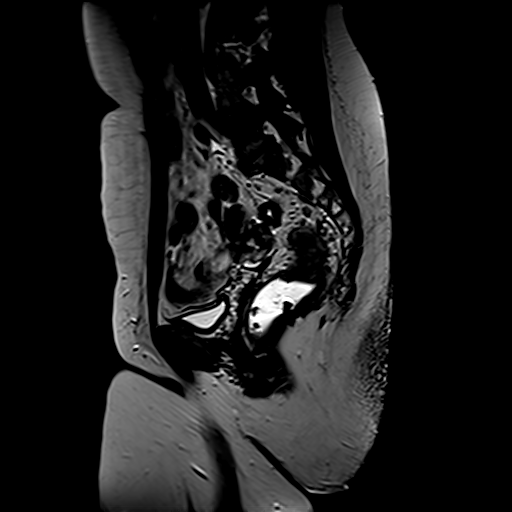
[im 24/40]
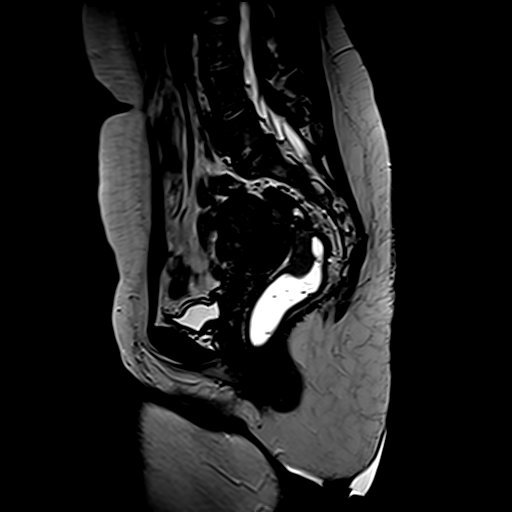
[im 32/40]
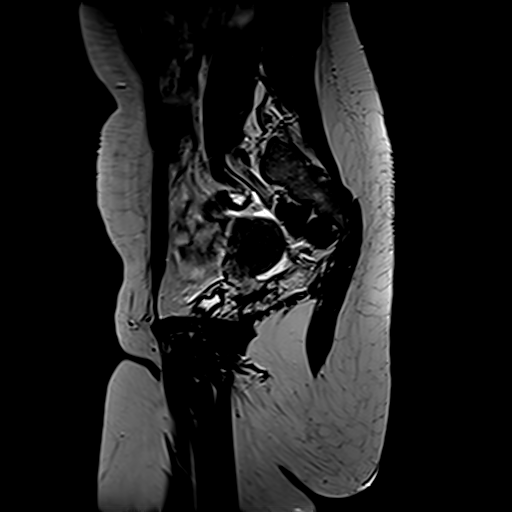
[im 40/40]
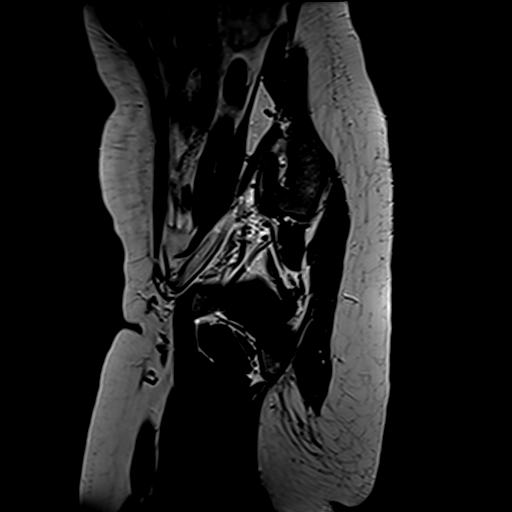

[Series 3: T2 · axial · 5.0mm · 0.99mm/px · z∈[-122,+70]mm · 4 of 29 slices shown (2 of 5)]
[im 1/29]
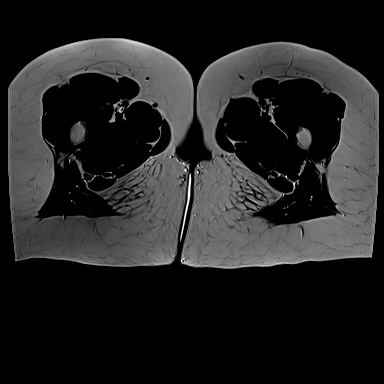
[im 10/29]
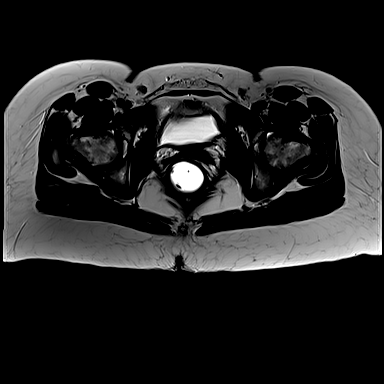
[im 19/29]
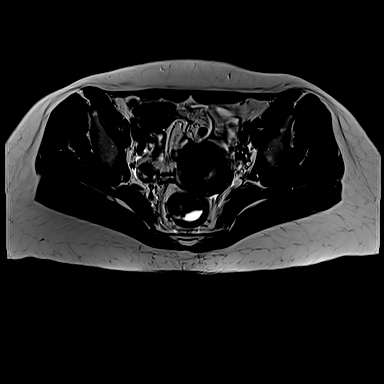
[im 29/29]
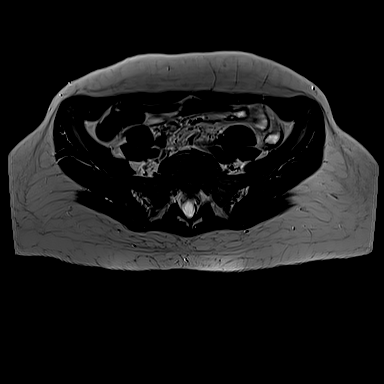

[Series 4: DWI · axial · 5.0mm · 1.48mm/px · z∈[-134,+76]mm · 11 of 72 slices shown (1 of 2)]
[im 1/72]
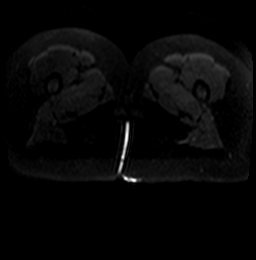
[im 8/72]
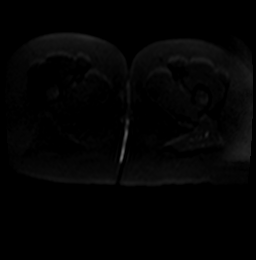
[im 15/72]
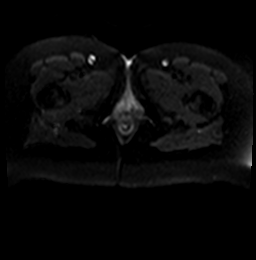
[im 22/72]
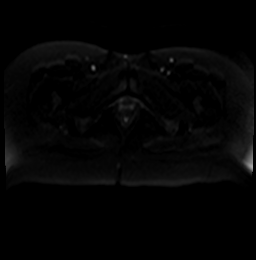
[im 29/72]
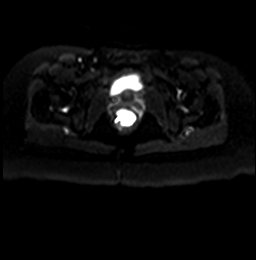
[im 36/72]
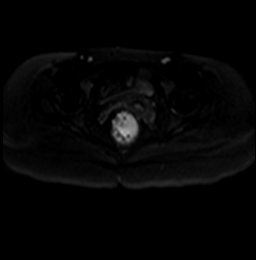
[im 43/72]
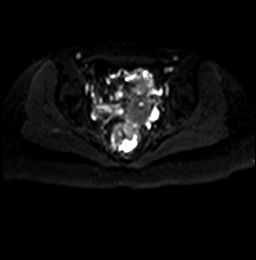
[im 50/72]
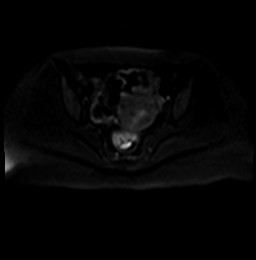
[im 57/72]
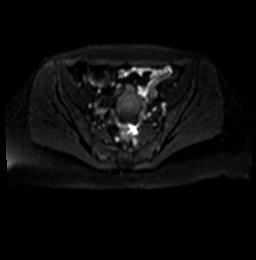
[im 64/72]
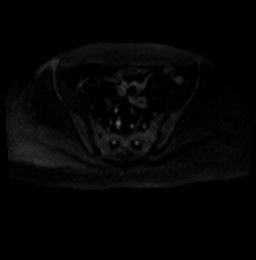
[im 72/72]
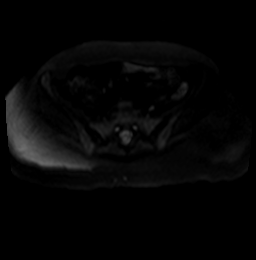

[Series 5: DWI · axial · 5.0mm · 1.48mm/px · z∈[-134,+76]mm · 6 of 36 slices shown (2 of 2)]
[im 1/36]
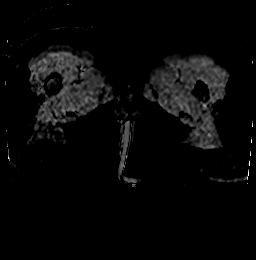
[im 8/36]
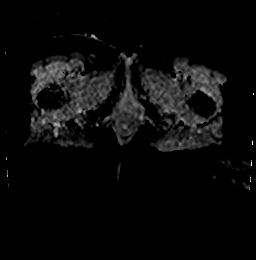
[im 15/36]
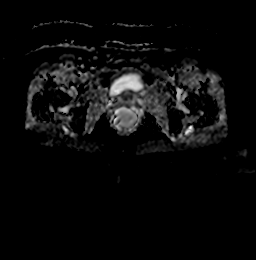
[im 22/36]
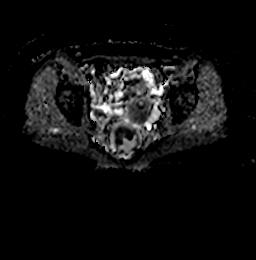
[im 29/36]
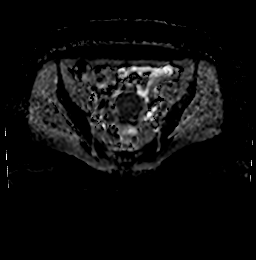
[im 36/36]
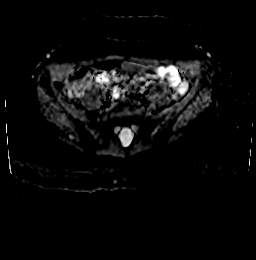

[Series 6: T2 · coronal · 3.0mm · 0.70mm/px · 7 of 47 slices shown (3 of 5)]
[im 1/47]
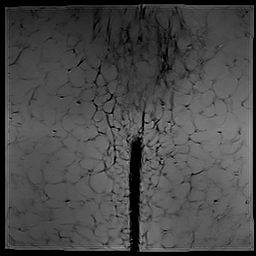
[im 8/47]
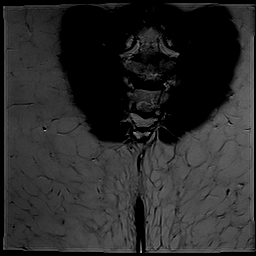
[im 16/47]
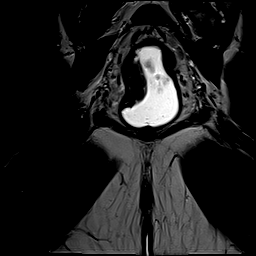
[im 24/47]
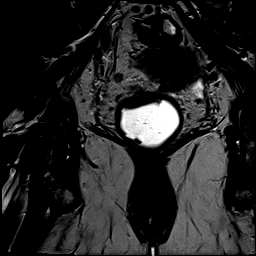
[im 31/47]
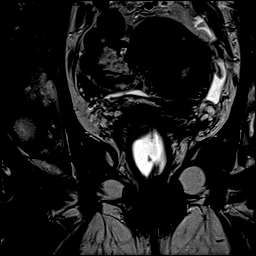
[im 39/47]
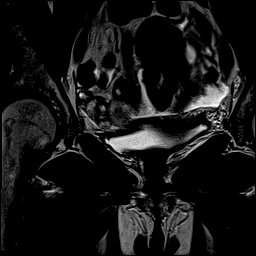
[im 47/47]
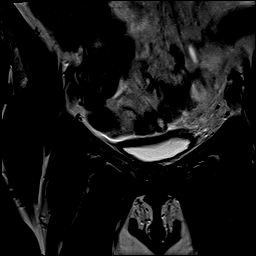

[Series 7: T2 · axial · 3.0mm · 0.70mm/px · z∈[-97,+55]mm · 8 of 52 slices shown (4 of 5)]
[im 1/52]
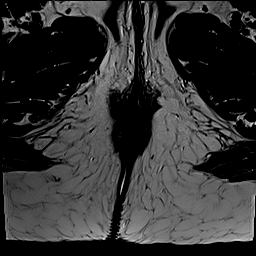
[im 8/52]
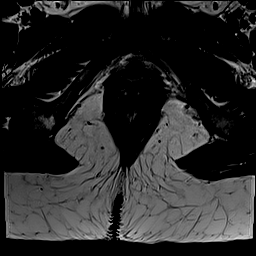
[im 15/52]
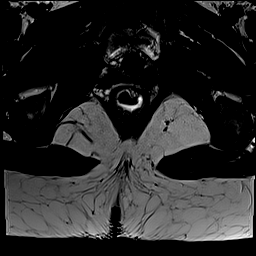
[im 22/52]
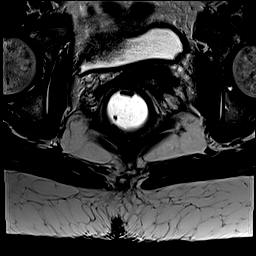
[im 30/52]
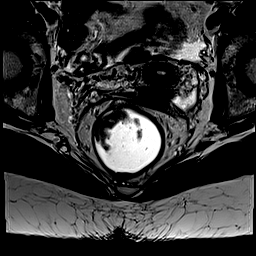
[im 37/52]
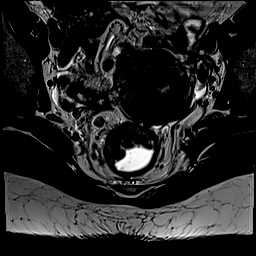
[im 44/52]
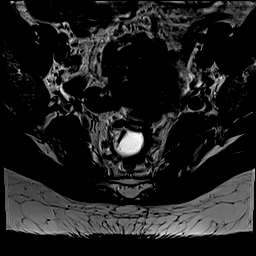
[im 52/52]
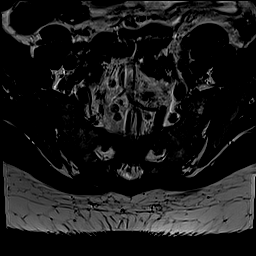

[Series 8: T2 · coronal · 3.0mm · 0.70mm/px · 4 of 38 slices shown (5 of 5)]
[im 1/38]
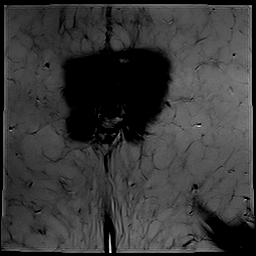
[im 8/38]
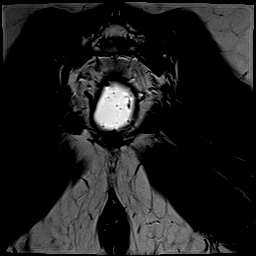
[im 15/38]
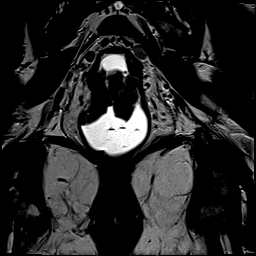
[im 23/38]
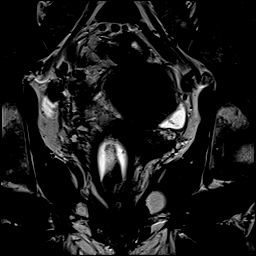

[46 of 48 positions shown; findings below may reference images not displayed]

FINDINGS: TUMOR LOCATION

Tumor distance from Anal Verge/Skin Surface:  10.4 cm

Tumor distance to Internal Anal Sphincter: 5.9 cm

TUMOR DESCRIPTION

Circumferential Extent: 50%, from 8-2 o'clock positions

Tumor Length: 4.1 cm

T - CATEGORY

Extension through Muscularis Propria:  Yes=T3

Shortest Distance from Mass to Mesorectal Fascia: 7 mm

Extramural Vascular Invasion/Tumor Thrombus: No

Invasion of Anterior Peritoneal Reflection: No

Involvement of Adjacent Organs or Pelvic Sidewall: No

Levator Ani Involvement: No

N - CATEGORY

Regional Lymph Nodes >=5mm: Approximately 15 (7 or more =N2b)

Other: Multiple uterine fibroids are seen, largest measuring 4.0 cm.
IMPRESSION: Rectal adenocarcinoma T stage: T3

Rectal adenocarcinoma N stage:  N2b

Distance from tumor to the internal anal sphincter is 5.9 cm.

Multiple small uterine fibroids incidentally noted.

## 2020-09-05 ENCOUNTER — Other Ambulatory Visit: Payer: Self-pay

## 2020-09-05 ENCOUNTER — Inpatient Hospital Stay: Payer: 59 | Attending: Oncology | Admitting: Oncology

## 2020-09-05 VITALS — BP 127/84 | HR 63 | Temp 97.8°F | Resp 18 | Ht 66.0 in | Wt 163.0 lb

## 2020-09-05 DIAGNOSIS — Z5111 Encounter for antineoplastic chemotherapy: Secondary | ICD-10-CM | POA: Insufficient documentation

## 2020-09-05 DIAGNOSIS — C20 Malignant neoplasm of rectum: Secondary | ICD-10-CM | POA: Insufficient documentation

## 2020-09-05 NOTE — Progress Notes (Signed)
[facility] OFFICE PROGRESS NOTE   Diagnosis: Rectal cancer  INTERVAL HISTORY:   Sharon Bennett returns as scheduled.  She continues to have rectal urgency and frequent bowel movements.  She has developed a "boil "in the right axilla and an area where she had an ingrown hair.  She is scheduled to see Dr. Dema Severin later today.  Objective:  Vital signs in last 24 hours:  Blood pressure 127/84, pulse 63, temperature 97.8 F (36.6 C), temperature source Oral, resp. rate 18, height _0  (1.676 m), weight 163 lb (73.9 kg), SpO2 100 %.   Lymphatics: "Shotty "bilateral low posterior cervical and axillary nodes, no inguinal nodes GI: No hepatosplenomegaly, no mass, nontender Vascular: No leg edema  Skin: Area of hidradenitis in the right axilla   Lab Results:  Lab Results  Component Value Date   WBC 7.2 08/21/2020   HGB 13.6 08/21/2020   HCT 41.9 08/21/2020   MCV 85.5 08/21/2020   PLT 319 08/21/2020   NEUTROABS 3.3 08/21/2020    CMP  Lab Results  Component Value Date   NA 136 08/21/2020   K 3.6 08/21/2020   CL 100 08/21/2020   CO2 25 08/21/2020   GLUCOSE 81 08/21/2020   BUN 9 08/21/2020   CREATININE 0.80 08/21/2020   CALCIUM 8.9 08/21/2020   PROT 7.9 08/21/2020   ALBUMIN 4.5 08/21/2020   AST 14 (L) 08/21/2020   ALT 11 08/21/2020   ALKPHOS 60 08/21/2020   BILITOT 0.4 08/21/2020   GFRNONAA >60 08/21/2020    No results found for: CEA1, CEA, CAN199, CA125  No results found for: INR, LABPROT  Imaging:  MR PELVIS WO CONTRAST  Result Date: 09/05/2020 CLINICAL DATA:  Rectal carcinoma. EXAM: MRI PELVIS WITHOUT CONTRAST TECHNIQUE: Multiplanar multisequence MR imaging of the pelvis was performed. No intravenous contrast was administered. Ultrasound gel was administered per rectum to optimize tumor evaluation. COMPARISON:  None. FINDINGS: TUMOR LOCATION Tumor distance from Anal Verge/Skin Surface:  10.4 cm Tumor distance to Internal Anal Sphincter: 5.9 cm TUMOR  DESCRIPTION Circumferential Extent: 50%, from 8-2 o'clock positions Tumor Length: 4.1 cm T - CATEGORY Extension through Muscularis Propria:  Yes=T3 Shortest Distance from Mass to Mesorectal Fascia: 7 mm Extramural Vascular Invasion/Tumor Thrombus: No Invasion of Anterior Peritoneal Reflection: No Involvement of Adjacent Organs or Pelvic Sidewall: No Levator Ani Involvement: No N - CATEGORY Regional Lymph Nodes >=55m: Approximately 15 (7 or more =N2b) Other: Multiple uterine fibroids are seen, largest measuring 4.0 cm. IMPRESSION: Rectal adenocarcinoma T stage: T3 Rectal adenocarcinoma N stage:  N2b Distance from tumor to the internal anal sphincter is 5.9 cm. Multiple small uterine fibroids incidentally noted. Electronically Signed   By: JMarlaine HindM.D.   On: 09/05/2020 11:13    Medications: I have reviewed the patient's current medications.   Assessment/Plan: Rectal cancer  CT abdomen/pelvis 08/21/2020-large rectal mass measuring 4.4 x 2.6 x 5.5 cm, haziness in the mesorectal fat, obscuration of the fat plane between the mass and the adjacent posterior aspect of the uterus, numerous prominent mesorectal lymph nodes measuring up to 5 mm Colonoscopy 08/23/2020-fungating infiltrative nonobstructing large mass found in the rectum.  Mass was noncircumferential, measured 5 cm in length, 3 cm in diameter and was 7 cm from the anal verge.  No bleeding was present.   Pathology showed invasive colonic adenocarcinoma, moderately differentiated.  No evidence of mismatch repair deficiency. Chest CT 09/01/2020-no evidence of metastatic disease MRI pelvis 09/02/2020-tumor at 10.4 cm from anal verge, 5.9 cm  from the internal anal sphincter, T3, approximately 15 greater than 5 mm lymph nodes, N2b, multiple uterine fibroids Rectal bleeding, change in bowel habits secondary to #1 Uterine fibroids noted on MRI pelvis 09/02/2020    Disposition: Sharon Bennett has been diagnosed with clinical stage III rectal cancer.  I  reviewed the MRI findings and images with her.  She has advanced clinical stage III disease.  I recommend total neoadjuvant therapy.  We discussed indications and goals for neoadjuvant therapy.  She is scheduled to see Dr. Dema Severin today.  The plan is to proceed with neoadjuvant FOLFOX if Dr. Dema Severin is in agreement.  We reviewed potential toxicities associated with the FOLFOX regimen including the chance of nausea/vomiting, mucositis, diarrhea, alopecia, and hematologic toxicity.  We discussed the sun sensitivity, rash, hyperpigmentation, and hand/foot syndrome associated with 5-fluorouracil.  We reviewed the allergic reaction and various types of neuropathy seen with oxaliplatin.  She agrees to proceed.  She will attend a chemotherapy teaching class.  We will asked Dr. Dema Severin to place a Port-A-Cath for the administration of chemotherapy.  The plan is to begin FOLFOX on 09/15/2020.  She will return for an office visit that day.  Sharon Bennett has been referred to the genetics counselors.  There was no loss of mismatch repair protein expression on the rectal biopsy.  She plans to see her gynecologist for further evaluation of the uterine fibroids.  She does not plan on having additional children.  She would like to have a hysterectomy at the time of rectal surgery.    A chemotherapy plan was entered today.  [name], MD  09/05/2020  1:27 PM

## 2020-09-05 NOTE — Progress Notes (Signed)
START ON PATHWAY REGIMEN - Colorectal     A cycle is every 14 days:     Oxaliplatin      Leucovorin      Fluorouracil      Fluorouracil   **Always confirm dose/schedule in your pharmacy ordering system**  Patient Characteristics: Preoperative or Nonsurgical Candidate (Clinical Staging), Rectal, cT3 - cT4, cN0 or Any cT, cN+ Tumor Location: Rectal Therapeutic Status: Preoperative or Nonsurgical Candidate (Clinical Staging) AJCC T Category: cT3 AJCC N Category: cN2b AJCC M Category: cM0 AJCC 8 Stage Grouping: IIIC Intent of Therapy: Curative Intent, Discussed with Patient

## 2020-09-06 ENCOUNTER — Other Ambulatory Visit: Payer: Self-pay | Admitting: Oncology

## 2020-09-06 DIAGNOSIS — C20 Malignant neoplasm of rectum: Secondary | ICD-10-CM

## 2020-09-07 ENCOUNTER — Encounter: Payer: Self-pay | Admitting: *Deleted

## 2020-09-07 ENCOUNTER — Other Ambulatory Visit: Payer: Self-pay

## 2020-09-07 ENCOUNTER — Other Ambulatory Visit: Payer: Self-pay | Admitting: *Deleted

## 2020-09-07 DIAGNOSIS — C20 Malignant neoplasm of rectum: Secondary | ICD-10-CM

## 2020-09-07 NOTE — Progress Notes (Signed)
The proposed treatment discussed in conference is for discussion purpose only and is not a binding recommendation.  The patients have not been physically examined, or presented with their treatment options.  Therefore, final treatment plans cannot be decided.  

## 2020-09-07 NOTE — Progress Notes (Signed)
Per pharmacy: Needs urine pregnancy test prior to 1st chemo and not needed to repeat unless she stops taking birth control.

## 2020-09-07 NOTE — Progress Notes (Signed)
Faxed completed FMLA form to [employer] at [phone]. Copy to HIM to scan and will provide patient copy at 8/8 patient education visit.

## 2020-09-08 ENCOUNTER — Other Ambulatory Visit: Payer: Self-pay

## 2020-09-08 ENCOUNTER — Inpatient Hospital Stay (HOSPITAL_BASED_OUTPATIENT_CLINIC_OR_DEPARTMENT_OTHER): Payer: 59 | Admitting: Genetic Counselor

## 2020-09-08 ENCOUNTER — Other Ambulatory Visit: Payer: Self-pay | Admitting: Radiology

## 2020-09-08 ENCOUNTER — Inpatient Hospital Stay: Payer: 59

## 2020-09-08 ENCOUNTER — Other Ambulatory Visit: Payer: Self-pay | Admitting: Genetic Counselor

## 2020-09-08 DIAGNOSIS — C20 Malignant neoplasm of rectum: Secondary | ICD-10-CM | POA: Diagnosis not present

## 2020-09-08 DIAGNOSIS — Z800 Family history of malignant neoplasm of digestive organs: Secondary | ICD-10-CM | POA: Diagnosis not present

## 2020-09-08 DIAGNOSIS — Z808 Family history of malignant neoplasm of other organs or systems: Secondary | ICD-10-CM | POA: Diagnosis not present

## 2020-09-08 DIAGNOSIS — Z803 Family history of malignant neoplasm of breast: Secondary | ICD-10-CM | POA: Diagnosis not present

## 2020-09-08 LAB — GENETIC SCREENING ORDER

## 2020-09-09 ENCOUNTER — Encounter: Payer: Self-pay | Admitting: Oncology

## 2020-09-09 ENCOUNTER — Encounter: Payer: Self-pay | Admitting: Genetic Counselor

## 2020-09-09 ENCOUNTER — Ambulatory Visit (HOSPITAL_COMMUNITY)
Admission: RE | Admit: 2020-09-09 | Discharge: 2020-09-09 | Disposition: A | Discharge status: Home / Self Care | Payer: 59 | Source: Ambulatory Visit | Attending: Oncology | Admitting: Oncology

## 2020-09-09 ENCOUNTER — Encounter (HOSPITAL_COMMUNITY): Payer: Self-pay

## 2020-09-09 ENCOUNTER — Other Ambulatory Visit: Payer: Self-pay | Admitting: Oncology

## 2020-09-09 ENCOUNTER — Ambulatory Visit (HOSPITAL_COMMUNITY)
Admission: RE | Admit: 2020-09-09 | Discharge: 2020-09-09 | Disposition: A | Discharge status: Home / Self Care | Payer: 59 | Source: Ambulatory Visit | Attending: Diagnostic Radiology | Admitting: Diagnostic Radiology

## 2020-09-09 DIAGNOSIS — Z885 Allergy status to narcotic agent status: Secondary | ICD-10-CM | POA: Insufficient documentation

## 2020-09-09 DIAGNOSIS — Z800 Family history of malignant neoplasm of digestive organs: Secondary | ICD-10-CM | POA: Insufficient documentation

## 2020-09-09 DIAGNOSIS — C20 Malignant neoplasm of rectum: Secondary | ICD-10-CM

## 2020-09-09 DIAGNOSIS — D259 Leiomyoma of uterus, unspecified: Secondary | ICD-10-CM | POA: Insufficient documentation

## 2020-09-09 DIAGNOSIS — Z87442 Personal history of urinary calculi: Secondary | ICD-10-CM | POA: Diagnosis not present

## 2020-09-09 DIAGNOSIS — Z452 Encounter for adjustment and management of vascular access device: Secondary | ICD-10-CM | POA: Diagnosis not present

## 2020-09-09 DIAGNOSIS — Z8249 Family history of ischemic heart disease and other diseases of the circulatory system: Secondary | ICD-10-CM | POA: Insufficient documentation

## 2020-09-09 DIAGNOSIS — Z803 Family history of malignant neoplasm of breast: Secondary | ICD-10-CM | POA: Insufficient documentation

## 2020-09-09 DIAGNOSIS — Z808 Family history of malignant neoplasm of other organs or systems: Secondary | ICD-10-CM | POA: Insufficient documentation

## 2020-09-09 HISTORY — PX: IR IMAGING GUIDED PORT INSERTION: IMG5740

## 2020-09-09 MED ORDER — MIDAZOLAM HCL 2 MG/2ML IJ SOLN
INTRAMUSCULAR | Status: AC | PRN
  Administered 2020-09-09 (×4): 1 mg via INTRAVENOUS

## 2020-09-09 MED ORDER — FENTANYL CITRATE (PF) 100 MCG/2ML IJ SOLN
INTRAMUSCULAR | Status: AC
  Filled 2020-09-09: qty 2

## 2020-09-09 MED ORDER — HEPARIN SOD (PORK) LOCK FLUSH 100 UNIT/ML IV SOLN
INTRAVENOUS | Status: AC
  Filled 2020-09-09: qty 5

## 2020-09-09 MED ORDER — FENTANYL CITRATE (PF) 100 MCG/2ML IJ SOLN
INTRAMUSCULAR | Status: AC | PRN
  Administered 2020-09-09 (×2): 50 ug via INTRAVENOUS

## 2020-09-09 MED ORDER — HEPARIN SOD (PORK) LOCK FLUSH 100 UNIT/ML IV SOLN
INTRAVENOUS | Status: AC | PRN
  Administered 2020-09-09: 500 [IU] via INTRAVENOUS

## 2020-09-09 MED ORDER — LIDOCAINE HCL (PF) 1 % IJ SOLN
INTRAMUSCULAR | Status: AC | PRN
  Administered 2020-09-09: 10 mL via INTRADERMAL

## 2020-09-09 MED ORDER — ONDANSETRON HCL 4 MG/2ML IJ SOLN
INTRAMUSCULAR | Status: AC
  Filled 2020-09-09: qty 2

## 2020-09-09 MED ORDER — LIDOCAINE-EPINEPHRINE 1 %-1:100000 IJ SOLN
INTRAMUSCULAR | Status: AC
  Filled 2020-09-09: qty 1

## 2020-09-09 MED ORDER — SODIUM CHLORIDE 0.9 % IV SOLN: INTRAVENOUS | Status: DC

## 2020-09-09 MED ORDER — LIDOCAINE HCL 1 % IJ SOLN
INTRAMUSCULAR | Status: AC
  Filled 2020-09-09: qty 20

## 2020-09-09 MED ORDER — LIDOCAINE-EPINEPHRINE 1 %-1:100000 IJ SOLN
INTRAMUSCULAR | Status: AC | PRN
  Administered 2020-09-09: 10 mL via INTRADERMAL

## 2020-09-09 MED ORDER — ONDANSETRON HCL 4 MG/2ML IJ SOLN
INTRAMUSCULAR | Status: AC | PRN
  Administered 2020-09-09: 4 mg via INTRAVENOUS

## 2020-09-09 MED ORDER — MIDAZOLAM HCL 2 MG/2ML IJ SOLN
INTRAMUSCULAR | Status: AC
  Filled 2020-09-09: qty 4

## 2020-09-09 NOTE — Consult Note (Addendum)
Chief Complaint: Patient was seen in consultation today for Port-A-Cath placement  Referring Physician(s): Sherrill,Gary B  Supervising Physician: Markus Daft  Patient Status: [facility] - Out-pt  History of Present Illness: Sharon Bennett is a 40 y.o. female with history of uterine fibroids, nephrolithiasis, and newly diagnosed rectal cancer who presents today for Port-A-Cath placement for chemotherapy.  History reviewed. No pertinent past medical history.  History reviewed. No pertinent surgical history.  Allergies: Codeine  Medications: Prior to Admission medications   Medication Sig Start Date End Date Taking? Authorizing Provider  JUNEL 1/20 1-20 MG-MCG tablet Take 1 tablet by mouth daily. 12/19/18  Yes [provider]     Family History  Problem Relation Age of Onset   Hypertension Mother    Heart failure Father    Hypertension Father     Social History   Socioeconomic History   Marital status: Single    Spouse name: Not on file   Number of children: Not on file   Years of education: Not on file   Highest education level: Not on file  Occupational History   Not on file  Tobacco Use   Smoking status: Never   Smokeless tobacco: Never  Vaping Use   Vaping Use: Never used  Substance and Sexual Activity   Alcohol use: Not on file    Comment: occasionally   Drug use: Not on file   Sexual activity: Yes  Other Topics Concern   Not on file  Social History Narrative   Not on file   Social Determinants of Health   Financial Resource Strain: Not on file  Food Insecurity: Not on file  Transportation Needs: Not on file  Physical Activity: Not on file  Stress: Not on file  Social Connections: Not on file      Review of Systems denies fever, headache, chest pain, dyspnea, cough, back pain, nausea, vomiting.  She does have some occasional lower abdominal cramping and has had some blood in stool recently  Vital Signs: BP 135/84   Pulse 86   Temp  98.7 F (37.1 C)   Resp 16   Ht '5\' 6"'$  (1.676 m)   Wt 161 lb (73 kg)   LMP 09/09/2020 Comment: no real periods  due to contraceptive  SpO2 100%   BMI 25.99 kg/m   Physical Exam awake, alert.  Chest clear to auscultation bilaterally.  Heart with regular rate and rhythm.  Abdomen soft, positive bowel sounds, currently nontender.  No lower extremity edema.  Imaging: CT Chest Wo Contrast  Result Date: 09/01/2020 CLINICAL DATA:  Recent diagnosis rectal cancer, chest staging EXAM: CT CHEST WITHOUT CONTRAST TECHNIQUE: Multidetector CT imaging of the chest was performed following the standard protocol without IV contrast. COMPARISON:  CT abdomen pelvis, 08/21/2020 FINDINGS: Cardiovascular: No significant vascular findings. Normal heart size. No pericardial effusion. Mediastinum/Nodes: No enlarged mediastinal, hilar, or axillary lymph nodes. Thyroid gland, trachea, and esophagus demonstrate no significant findings. Lungs/Pleura: Lungs are clear. No pleural effusion or pneumothorax. Upper Abdomen: No acute abnormality. Musculoskeletal: No chest wall mass or suspicious bone lesions identified. IMPRESSION: No evidence of metastatic disease in the chest. Electronically Signed   By: Eddie Candle M.D.   On: 09/01/2020 11:12   MR PELVIS WO CONTRAST  Result Date: 09/05/2020 CLINICAL DATA:  Rectal carcinoma. EXAM: MRI PELVIS WITHOUT CONTRAST TECHNIQUE: Multiplanar multisequence MR imaging of the pelvis was performed. No intravenous contrast was administered. Ultrasound gel was administered per rectum to optimize tumor evaluation. COMPARISON:  None. FINDINGS: TUMOR LOCATION Tumor distance from Anal Verge/Skin Surface:  10.4 cm Tumor distance to Internal Anal Sphincter: 5.9 cm TUMOR DESCRIPTION Circumferential Extent: 50%, from 8-2 o'clock positions Tumor Length: 4.1 cm T - CATEGORY Extension through Muscularis Propria:  Yes=T3 Shortest Distance from Mass to Mesorectal Fascia: 7 mm Extramural Vascular Invasion/Tumor  Thrombus: No Invasion of Anterior Peritoneal Reflection: No Involvement of Adjacent Organs or Pelvic Sidewall: No Levator Ani Involvement: No N - CATEGORY Regional Lymph Nodes >=33m: Approximately 15 (7 or more =N2b) Other: Multiple uterine fibroids are seen, largest measuring 4.0 cm. IMPRESSION: Rectal adenocarcinoma T stage: T3 Rectal adenocarcinoma N stage:  N2b Distance from tumor to the internal anal sphincter is 5.9 cm. Multiple small uterine fibroids incidentally noted. Electronically Signed   By: JMarlaine HindM.D.   On: 09/05/2020 11:13   CT Abdomen Pelvis W Contrast  Result Date: 08/21/2020 CLINICAL DATA:  40year old female with bright red blood in stools. Suspected diverticulitis. EXAM: CT ABDOMEN AND PELVIS WITH CONTRAST TECHNIQUE: Multidetector CT imaging of the abdomen and pelvis was performed using the standard protocol following bolus administration of intravenous contrast. CONTRAST:  716mOMNIPAQUE IOHEXOL 300 MG/ML  SOLN COMPARISON:  No priors. FINDINGS: Lower chest: Unremarkable. Hepatobiliary: No suspicious cystic or solid hepatic lesions. No intra or extrahepatic biliary ductal dilatation. Gallbladder is normal in appearance. Pancreas: No pancreatic mass. No pancreatic ductal dilatation. No pancreatic or peripancreatic fluid collections or inflammatory changes. Spleen: Unremarkable. Adrenals/Urinary Tract: 2 mm nonobstructive calculus in the lower pole collecting system of the right kidney. Subcentimeter low-attenuation lesion in the upper pole of the right kidney, too small to characterize, but statistically likely to represent a tiny cyst. Left kidney and bilateral adrenal glands are normal in appearance. No hydroureteronephrosis. Urinary bladder is normal in appearance. Stomach/Bowel: The appearance of the stomach is normal. There is no pathologic dilatation of small bowel or colon. No significant diverticular disease. Normal appendix. Large rectal mass estimated to measure approximately  4.4 x 2.6 x 5.5 cm (axial image 60 of series 2 and coronal image 23 of series 5). Haziness in the mesorectal fat suggesting local infiltration of disease. Obscuration of the fat plane between the mass and the adjacent posterior aspect of the uterus. Numerous prominent mesorectal lymph nodes measuring up to 5 mm in short axis, suspicious for nodal metastasis. Vascular/Lymphatic: No significant atherosclerotic disease, aneurysm or dissection noted in the abdominal or pelvic vasculature. As mentioned above there are numerous prominent but nonenlarged mesorectal lymph nodes suspicious for nodal metastasis given the adjacent large rectal mass. No other lymphadenopathy noted elsewhere in the abdomen or pelvis. Reproductive: Uterus is heterogeneous in appearance with multiple lesions, likely to represent small fibroids. Obscuration of the fat plane between the posterior aspect of the uterine body and fundus and the adjacent rectal mass. Ovaries are unremarkable in appearance. Other: No significant volume of ascites.  No pneumoperitoneum. Musculoskeletal: There are no aggressive appearing lytic or blastic lesions noted in the visualized portions of the skeleton. IMPRESSION: 1. Large rectal mass estimated to measure approximately 4.4 x 2.6 x 5.5 cm highly suspicious for primary rectal neoplasm. There is evidence of local invasion into the mesorectal soft tissues, the lesion is intimately associated with the posterior wall of the uterus with the intervening fat plane of completely obscured, and there are several nonenlarged but suspicious mesorectal lymph nodes concerning for local nodal metastases. Surgical consultation is recommended. 2. 2 mm nonobstructive calculus in the lower pole collecting system of the right kidney.  3. Fibroid uterus. Electronically Signed   By: Vinnie Langton M.D.   On: 08/21/2020 11:17    Labs:  CBC: Recent Labs    08/21/20 0923  WBC 7.2  HGB 13.6  HCT 41.9  PLT 319    COAGS: No  results for input(s): INR, APTT in the last 8760 hours.  BMP: Recent Labs    08/21/20 0923  NA 136  K 3.6  CL 100  CO2 25  GLUCOSE 81  BUN 9  CALCIUM 8.9  CREATININE 0.80  GFRNONAA >60    LIVER FUNCTION TESTS: Recent Labs    08/21/20 0923  BILITOT 0.4  AST 14*  ALT 11  ALKPHOS 60  PROT 7.9  ALBUMIN 4.5    TUMOR MARKERS: No results for input(s): AFPTM, CEA, CA199, CHROMGRNA in the last 8760 hours.  Assessment and Plan: 40 y.o. female with history of uterine fibroids, nephrolithiasis, and newly diagnosed rectal cancer who presents today for Port-A-Cath placement for chemotherapy.Risks and benefits of image guided port-a-catheter placement was discussed with the patient including, but not limited to bleeding, infection, pneumothorax, or fibrin sheath development and need for additional procedures.  All of the patient's questions were answered, patient is agreeable to proceed. Consent signed and in chart.    Thank you for this interesting consult.  I greatly enjoyed meeting Sharon Bennett and look forward to participating in their care.  A copy of this report was sent to the requesting provider on this date.  Electronically Signed: D. Rowe Robert, PA-C 09/09/2020, 8:37 AM   I spent a total of   25 minutes  in face to face in clinical consultation, greater than 50% of which was counseling/coordinating care for Port-A-Cath placement

## 2020-09-09 NOTE — Progress Notes (Signed)
REFERRING PROVIDER: Owens Shark, NP [address] [place],  [place] [address]  PRIMARY PROVIDER:  [place], Blondell Reveal, MD  PRIMARY REASON FOR VISIT:  1. Rectal cancer ([place])   2. Family history of pancreatic cancer   3. Family history of brain cancer   4. Family history of breast cancer      HISTORY OF PRESENT ILLNESS:   Sharon Bennett, a 40 y.o. female, was seen for a [place] cancer genetics consultation at the request of [name], NP, due to a personal and family history of cancer. Sharon Bennett presents to clinic today to discuss the possibility of a hereditary predisposition to cancer, genetic testing, and to further clarify her future cancer risks, as well as potential cancer risks for family members.   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 Mismatch repair protein IHC was intact.    CANCER HISTORY:  Oncology History  Rectal cancer ([place])  09/05/2020 Initial Diagnosis   Rectal cancer ([place])    09/05/2020 Cancer Staging   Staging form: Colon and Rectum, AJCC 8th Edition - Clinical: Stage IIIC (cT3, cN2b, cM0) - Signed by Ladell Pier, MD on 09/05/2020    09/15/2020 -  Chemotherapy    Patient is on Treatment Plan: COLORECTAL FOLFOX Q14D X 4 MONTHS          RISK FACTORS:  Menarche was at age 48.5.  First live birth at age 89.  OCP use for approximately 3 years.  Ovaries intact: yes.  Hysterectomy: no.  Menopausal status: premenopausal.  HRT use: 0 years. Colonoscopy: yes. Mammogram within the last year: yes. Number of breast biopsies: 0. Any excessive radiation exposure in the past: no.   Past Medical History:  Diagnosis Date   Family history of brain cancer    Family history of breast cancer    Family history of pancreatic cancer     No past surgical history on file.  Social History   Socioeconomic History   Marital status: Single    Spouse name: Not on file   Number  of children: Not on file   Years of education: Not on file   Highest education level: Not on file  Occupational History   Not on file  Tobacco Use   Smoking status: Never   Smokeless tobacco: Never  Vaping Use   Vaping Use: Never used  Substance and Sexual Activity   Alcohol use: Not on file    Comment: occasionally   Drug use: Not on file   Sexual activity: Yes  Other Topics Concern   Not on file  Social History Narrative   Not on file   Social Determinants of Health   Financial Resource Strain: Not on file  Food Insecurity: Not on file  Transportation Needs: Not on file  Physical Activity: Not on file  Stress: Not on file  Social Connections: Not on file     FAMILY HISTORY:  We obtained a detailed, 4-generation family history.  Significant diagnoses are listed below: Family History  Problem Relation Age of Onset   Hypertension Mother    Heart failure Father    Hypertension Father    Pancreatic cancer Maternal Grandfather        dx 83s   Brain cancer Paternal Grandmother        dx >50   Breast cancer Other 14       MGF's sister  Sharon Bennett has one daughter (age 57) and one son (age 58). She has one brother (age 82) and one sister (age 70). None of these relatives have had cancer. Neither of her siblings have had a colonoscopy, although her brother has one scheduled for this year.2  Sharon Bennett's mother is alive at age 48 without cancer. There is one maternal aunt and three maternal uncles. There is no known cancer among maternal aunts/uncles or maternal cousins. Sharon Bennett does not have any information about her maternal grandmother. Her maternal grandfather died in his 57s with pancreatic cancer. A maternal great-aunt (MGF's sister) had breast cancer around age 41.   Sharon Bennett's father died at age 41 without cancer. There were six paternal aunts and four paternal uncles. There is no known cancer among paternal aunts/uncles or paternal cousins. Sharon Bennett paternal  grandmother died older than 31 with brain cancer. Her paternal grandfather died older than 53 without cancer.  Sharon Bennett is unaware of previous family history of genetic testing for hereditary cancer risks. Patient's ancestors are of unknown descent. There is no reported Ashkenazi Jewish ancestry. There is no known consanguinity.  GENETIC COUNSELING ASSESSMENT: Sharon Bennett is a 40 y.o. female with a personal and family history of cancer which is somewhat suggestive of a hereditary cancer syndrome and predisposition to cancer. We, therefore, discussed and recommended the following at today's visit.   DISCUSSION: We discussed that, in general, most cancer is not inherited in families, but instead is sporadic or familial. Sporadic cancers occur by chance and typically happen at older ages (>50 years) as this type of cancer is caused by genetic changes acquired during an individual's lifetime. Some families have more cancers than would be expected by chance; however, the ages or types of cancer are not consistent with a known genetic mutation or known genetic mutations have been ruled out. This type of familial cancer is thought to be due to a combination of multiple genetic, environmental, hormonal, and lifestyle factors. While this combination of factors likely increases the risk of cancer, the exact source of this risk is not currently identifiable or testable.    We discussed that approximately 5-10% of cancer is hereditary, meaning that it is due to a mutation in a single gene that is passed down from generation to generation in a family. Most hereditary cases of colorectal cancer are associated with Lynch syndrome, although there are other genes that can be associated an increased risk for colorectal cancer, including APC, MUTYH, CHEK2, etc. We discussed that testing can be beneficial for several reasons, including knowing about other cancer risks, identifying potential screening and risk-reduction options  that may be appropriate, and to understand if other family members could be at risk for cancer and allow them to undergo genetic testing.   We reviewed the characteristics, features and inheritance patterns of hereditary cancer syndromes. We also discussed genetic testing, including the appropriate family members to test, the process of testing, insurance coverage and turn-around-time for results. We discussed the implications of a negative, positive and/or variant of uncertain significant result. We recommended Sharon Bennett pursue genetic testing for the [facility] Multi-Cancer + RNA panel.   The Multi-Cancer + RNA Panel offered by [facility] includes sequencing and/or deletion/duplication analysis of the following 84 genes:  AIP*, ALK, APC*, ATM*, AXIN2*, BAP1*, BARD1*, BLM*, BMPR1A*, BRCA1*, BRCA2*, BRIP1*, CASR, CDC73*, CDH1*, CDK4, CDKN1B*, CDKN1C*, CDKN2A, CEBPA, CHEK2*, CTNNA1*, DICER1*, DIS3L2*, EGFR, EPCAM, FH*, FLCN*, GATA2*, GPC3, GREM1, HOXB13, HRAS, KIT, MAX*, MEN1*, MET,  MITF, MLH1*, MSH2*, MSH3*, MSH6*, MUTYH*, NBN*, NF1*, NF2*, NTHL1*, PALB2*, PDGFRA, PHOX2B, PMS2*, POLD1*, POLE*, POT1*, PRKAR1A*, PTCH1*, PTEN*, RAD50*, RAD51C*, RAD51D*, RB1*, RECQL4, RET, RUNX1*, SDHA*, SDHAF2*, SDHB*, SDHC*, SDHD*, SMAD4*, SMARCA4*, SMARCB1*, SMARCE1*, STK11*, SUFU*, TERC, TERT, TMEM127*, Tp53*, TSC1*, TSC2*, VHL*, WRN*, and WT1.  RNA analysis is performed for * genes.  Based on Sharon Bennett's personal and family history of cancer, she meets medical criteria for genetic testing. Despite that she meets criteria, there may still be an out of pocket cost.     PLAN: After considering the risks, benefits, and limitations, Sharon Bennett provided informed consent to pursue genetic testing and the blood sample was sent to [facility] for analysis of the Multi-Cancer + RNA panel. Results should be available within approximately two-three weeks' time, at which point they will be disclosed by telephone to Sharon Bennett, as will  any additional recommendations warranted by these results. Sharon Bennett will receive a summary of her genetic counseling visit and a copy of her results once available. This information will also be available in Epic.   Sharon Bennett questions were answered to her satisfaction today. Our contact information was provided should additional questions or concerns arise. Thank you for the referral and allowing [place] to share in the care of your patient.   Clint Guy, A[place], [facility] Licensed, Certified Dispensing [email or web address] Phone: [phone]  The patient was seen for a total of 35 minutes in face-to-face genetic counseling. Patient was seen alone. This patient was discussed with Drs. Magrinat, Lindi Adie and/or Burr Medico who agrees with the above.    _______________________________________________________________________ For Office Staff:  Number of people involved in session: 1 Was an Intern/ student involved with case: no

## 2020-09-09 NOTE — Discharge Instructions (Signed)
For questions /concerns may call Interventional Radiology at [phone]  You may remove your dressing and shower tomorrow afternoon  DO NOT use EMLA cream for 2 weeks after port placement as the cream will remove surgical glue on your incision.     Moderate Conscious Sedation, Adult, Care After This sheet gives you information about how to care for yourself after your procedure. Your health care provider may also give you more specific instructions. If you have problems or questions, contact your health careprovider. What can I expect after the procedure? After the procedure, it is common to have: Sleepiness for several hours. Impaired judgment for several hours. Difficulty with balance. Vomiting if you eat too soon. Follow these instructions at home: For the time period you were told by your health care provider: Rest. Do not participate in activities where you could fall or become injured. Do not drive or use machinery. Do not drink alcohol. Do not take sleeping pills or medicines that cause drowsiness. Do not make important decisions or sign legal documents. Do not take care of children on your own. Eating and drinking  Follow the diet recommended by your health care provider. Drink enough fluid to keep your urine pale yellow. If you vomit: Drink water, juice, or soup when you can drink without vomiting. Make sure you have little or no nausea before eating solid foods.  General instructions Take over-the-counter and prescription medicines only as told by your health care provider. Have a responsible adult stay with you for the time you are told. It is important to have someone help care for you until you are awake and alert. Do not smoke. Keep all follow-up visits as told by your health care provider. This is important. Contact a health care provider if: You are still sleepy or having trouble with balance after 24 hours. You feel light-headed. You keep feeling nauseous or  you keep vomiting. You develop a rash. You have a fever. You have redness or swelling around the IV site. Get help right away if: You have trouble breathing. You have new-onset confusion at home. Summary After the procedure, it is common to feel sleepy, have impaired judgment, or feel nauseous if you eat too soon. Rest after you get home. Know the things you should not do after the procedure. Follow the diet recommended by your health care provider and drink enough fluid to keep your urine pale yellow. Get help right away if you have trouble breathing or new-onset confusion at home. This information is not intended to replace advice given to you by your health care provider. Make sure you discuss any questions you have with your healthcare provider.  Implanted Port Insertion, Care After This sheet gives you information about how to care for yourself after your procedure. Your health care provider may also give you more specific instructions. If you have problems or questions, contact your health careprovider. What can I expect after the procedure? After the procedure, it is common to have: Discomfort at the port insertion site. Bruising on the skin over the port. This should improve over 3-4 days. Follow these instructions at home: Port care After your port is placed, you will get a manufacturer's information card. The card has information about your port. Keep this card with you at all times. Take care of the port as told by your health care provider. Ask your health care provider if you or a family member can get training for taking care of the port at home. A   home health care nurse may also take care of the port. Make sure to remember what type of port you have. Incision care Follow instructions from your health care provider about how to take care of your port insertion site. Make sure you: Wash your hands with soap and water before and after you change your bandage (dressing). If soap  and water are not available, use hand sanitizer. Change your dressing as told by your health care provider. Leave skin glue, or adhesive strips in place. These skin closures may need to stay in place for 2 weeks or longer.  Check your port insertion site every day for signs of infection. Check for:      - Redness, swelling, or pain.                     - Fluid or blood.      - Warmth.      - Pus or a bad smell. Activity Return to your normal activities as told by your health care provider. Ask your health care provider what activities are safe for you. Do not lift anything that is heavier than 10 lb (4.5 kg), or the limit that you are told, until your health care provider says that it is safe. General instructions Take over-the-counter and prescription medicines only as told by your health care provider. Do not take baths, swim, or use a hot tub until your health care provider approves. Ask your health care provider if you may take showers. You may only be allowed to take sponge baths. Do not drive for 24 hours if you were given a sedative during your procedure. Wear a medical alert bracelet in case of an emergency. This will tell any health care providers that you have a port. Keep all follow-up visits as told by your health care provider. This is important. Contact a health care provider if: You cannot flush your port with saline as directed, or you cannot draw blood from the port. You have a fever or chills. You have redness, swelling, or pain around your port insertion site. You have fluid or blood coming from your port insertion site. Your port insertion site feels warm to the touch. You have pus or a bad smell coming from the port insertion site. Get help right away if: You have chest pain or shortness of breath. You have bleeding from your port that you cannot control. Summary Take care of the port as told by your health care provider. Keep the manufacturer's information card with  you at all times. Change your dressing as told by your health care provider. Contact a health care provider if you have a fever or chills or if you have redness, swelling, or pain around your port insertion site. Keep all follow-up visits as told by your health care provider. This information is not intended to replace advice given to you by your health care provider. Make sure you discuss any questions you have with your healthcare provider. Document Revised: 08/20/2017 Document Reviewed: 08/20/2017  

## 2020-09-09 NOTE — Procedures (Signed)
Interventional Radiology Procedure:   Indications: Rectal cancer  Procedure: Port placement  Findings: Right jugular port, tip at SVC/RA junction  Complications: None     EBL: Minimal, less than 10 ml  Plan: Discharge in one hour.  Keep port site and incisions dry for at least 24 hours.     Rodd Heft R. Shakeel Disney, MD  Pager: [phone]   

## 2020-09-12 ENCOUNTER — Other Ambulatory Visit: Payer: Self-pay

## 2020-09-12 ENCOUNTER — Inpatient Hospital Stay: Payer: 59

## 2020-09-12 DIAGNOSIS — N926 Irregular menstruation, unspecified: Secondary | ICD-10-CM | POA: Diagnosis not present

## 2020-09-12 MED ORDER — LIDOCAINE-PRILOCAINE 2.5-2.5 % EX CREA: 1.0000 "application " | TOPICAL_CREAM | CUTANEOUS | 2 refills | Status: DC | PRN

## 2020-09-12 MED ORDER — ONDANSETRON HCL 8 MG PO TABS: 8.0000 mg | ORAL_TABLET | Freq: Three times a day (TID) | ORAL | 1 refills | Status: DC | PRN

## 2020-09-12 MED ORDER — PROCHLORPERAZINE MALEATE 10 MG PO TABS: 10.0000 mg | ORAL_TABLET | Freq: Four times a day (QID) | ORAL | 1 refills | Status: DC | PRN

## 2020-09-14 ENCOUNTER — Ambulatory Visit: Payer: 59

## 2020-09-14 ENCOUNTER — Other Ambulatory Visit: Payer: 59

## 2020-09-14 ENCOUNTER — Ambulatory Visit: Payer: 59 | Admitting: Nurse Practitioner

## 2020-09-14 NOTE — Progress Notes (Signed)
Pharmacist Chemotherapy Monitoring - Initial Assessment    Anticipated start date: 09/15/20   The following has been reviewed per standard work regarding the patient's treatment regimen: The patient's diagnosis, treatment plan and drug doses, and organ/hematologic function Lab orders and baseline tests specific to treatment regimen  The treatment plan start date, drug sequencing, and pre-medications Prior authorization status  Patient's documented medication list, including drug-drug interaction screen and prescriptions for anti-emetics and supportive care specific to the treatment regimen The drug concentrations, fluid compatibility, administration routes, and timing of the medications to be used The patient's access for treatment and lifetime cumulative dose history, if applicable  The patient's medication allergies and previous infusion related reactions, if applicable   Changes made to treatment plan:  N/A  Follow up needed:  N/A   Sharon Bennett, [facility], 09/14/2020  3:08 PM

## 2020-09-15 ENCOUNTER — Inpatient Hospital Stay: Payer: 59

## 2020-09-15 ENCOUNTER — Inpatient Hospital Stay: Payer: 59 | Admitting: Nurse Practitioner

## 2020-09-15 ENCOUNTER — Inpatient Hospital Stay: Payer: 59 | Admitting: Oncology

## 2020-09-15 ENCOUNTER — Other Ambulatory Visit: Payer: Self-pay

## 2020-09-15 ENCOUNTER — Encounter: Payer: Self-pay | Admitting: Nurse Practitioner

## 2020-09-15 VITALS — BP 120/77 | HR 91 | Temp 97.9°F | Resp 18 | Ht 66.0 in | Wt 163.0 lb

## 2020-09-15 DIAGNOSIS — C20 Malignant neoplasm of rectum: Secondary | ICD-10-CM

## 2020-09-15 DIAGNOSIS — Z5111 Encounter for antineoplastic chemotherapy: Secondary | ICD-10-CM | POA: Diagnosis not present

## 2020-09-15 LAB — CMP (CANCER CENTER ONLY)
ALT: 10 U/L (ref 0–44)
AST: 10 U/L — ABNORMAL LOW (ref 15–41)
Albumin: 3.7 g/dL (ref 3.5–5.0)
Alkaline Phosphatase: 52 U/L (ref 38–126)
Anion gap: 7 (ref 5–15)
BUN: 7 mg/dL (ref 6–20)
CO2: 26 mmol/L (ref 22–32)
Calcium: 8.3 mg/dL — ABNORMAL LOW (ref 8.9–10.3)
Chloride: 106 mmol/L (ref 98–111)
Creatinine: 0.58 mg/dL (ref 0.44–1.00)
GFR, Estimated: >60 mL/min (ref >60)
Glucose, Bld: 86 mg/dL (ref 70–99)
Potassium: 3.8 mmol/L (ref 3.5–5.1)
Sodium: 139 mmol/L (ref 135–145)
Total Bilirubin: 0.4 mg/dL (ref 0.3–1.2)
Total Protein: 6.8 g/dL (ref 6.5–8.1)

## 2020-09-15 LAB — CBC WITH DIFFERENTIAL (CANCER CENTER ONLY)
Abs Immature Granulocytes: 0.01 10*3/uL (ref 0.00–0.07)
Basophils Absolute: 0 10*3/uL (ref 0.0–0.1)
Basophils Relative: 1 %
Eosinophils Absolute: 0.4 10*3/uL (ref 0.0–0.5)
Eosinophils Relative: 5 %
HCT: 37.6 % (ref 36.0–46.0)
Hemoglobin: 11.9 g/dL — ABNORMAL LOW (ref 12.0–15.0)
Immature Granulocytes: 0 %
Lymphocytes Relative: 33 %
Lymphs Abs: 2.3 10*3/uL (ref 0.7–4.0)
MCH: 27.1 pg (ref 26.0–34.0)
MCHC: 31.6 g/dL (ref 30.0–36.0)
MCV: 85.6 fL (ref 80.0–100.0)
Monocytes Absolute: 0.6 10*3/uL (ref 0.1–1.0)
Monocytes Relative: 9 %
Neutro Abs: 3.6 10*3/uL (ref 1.7–7.7)
Neutrophils Relative %: 52 %
Platelet Count: 256 10*3/uL (ref 150–400)
RBC: 4.39 MIL/uL (ref 3.87–5.11)
RDW: 13.3 % (ref 11.5–15.5)
WBC Count: 6.8 10*3/uL (ref 4.0–10.5)
nRBC: 0 % (ref 0.0–0.2)

## 2020-09-15 LAB — CEA (ACCESS): CEA (CHCC): 1.48 ng/mL (ref 0.00–5.00)

## 2020-09-15 LAB — PREGNANCY, URINE: Preg Test, Ur: NEGATIVE

## 2020-09-15 MED ORDER — SODIUM CHLORIDE 0.9 % IV SOLN
2400.0000 mg/m2 | INTRAVENOUS | Status: DC
  Administered 2020-09-15: 4450 mg via INTRAVENOUS
  Filled 2020-09-15: qty 89

## 2020-09-15 MED ORDER — LEUCOVORIN CALCIUM INJECTION 350 MG
400.0000 mg/m2 | Freq: Once | INTRAVENOUS | Status: AC
  Administered 2020-09-15: 744 mg via INTRAVENOUS
  Filled 2020-09-15: qty 37.2

## 2020-09-15 MED ORDER — FLUOROURACIL CHEMO INJECTION 2.5 GM/50ML
400.0000 mg/m2 | Freq: Once | INTRAVENOUS | Status: AC
  Administered 2020-09-15: 750 mg via INTRAVENOUS
  Filled 2020-09-15: qty 15

## 2020-09-15 MED ORDER — PALONOSETRON HCL INJECTION 0.25 MG/5ML
0.2500 mg | Freq: Once | INTRAVENOUS | Status: AC
  Administered 2020-09-15: 0.25 mg via INTRAVENOUS
  Filled 2020-09-15: qty 5

## 2020-09-15 MED ORDER — OXALIPLATIN CHEMO INJECTION 100 MG/20ML
85.0000 mg/m2 | Freq: Once | INTRAVENOUS | Status: AC
  Administered 2020-09-15: 160 mg via INTRAVENOUS
  Filled 2020-09-15: qty 32

## 2020-09-15 MED ORDER — SODIUM CHLORIDE 0.9 % IV SOLN
10.0000 mg | Freq: Once | INTRAVENOUS | Status: AC
  Administered 2020-09-15: 10 mg via INTRAVENOUS
  Filled 2020-09-15: qty 1

## 2020-09-15 MED ORDER — DEXTROSE 5 % IV SOLN
Freq: Once | INTRAVENOUS | Status: AC
  Filled 2020-09-15: qty 250

## 2020-09-15 MED ORDER — SODIUM CHLORIDE 0.9% FLUSH
10.0000 mL | INTRAVENOUS | Status: DC | PRN
  Filled 2020-09-15: qty 10

## 2020-09-15 NOTE — Patient Instructions (Signed)
Implanted Port Home Guide An implanted port is a device that is placed under the skin. It is usually placed in the chest. The device can be used to give IV medicine, to take blood, or for dialysis. You may have an implanted port if: You need IV medicine that would be irritating to the small veins in your hands or arms. You need IV medicines, such as antibiotics, for a long period of time. You need IV nutrition for a long period of time. You need dialysis. When you have a port, your health care provider can choose to use the port instead of veins in your arms for these procedures. You may have fewer limitations when using a port than you would if you used other types of long-term IVs, and you will likely be able to return to normal activities afteryour incision heals. An implanted port has two main parts: Reservoir. The reservoir is the part where a needle is inserted to give medicines or draw blood. The reservoir is round. After it is placed, it appears as a small, raised area under your skin. Catheter. The catheter is a thin, flexible tube that connects the reservoir to a vein. Medicine that is inserted into the reservoir goes into the catheter and then into the vein. How is my port accessed? To access your port: A numbing cream may be placed on the skin over the port site. Your health care provider will put on a mask and sterile gloves. The skin over your port will be cleaned carefully with a germ-killing soap and allowed to dry. Your health care provider will gently pinch the port and insert a needle into it. Your health care provider will check for a blood return to make sure the port is in the vein and is not clogged. If your port needs to remain accessed to get medicine continuously (constant infusion), your health care provider will place a clear bandage (dressing) over the needle site. The dressing and needle will need to be changed every week, or as told by your health care provider. What  is flushing? Flushing helps keep the port from getting clogged. Follow instructions from your health care provider about how and when to flush the port. Ports are usually flushed with saline solution or a medicine called heparin. The need for flushing will depend on how the port is used: If the port is only used from time to time to give medicines or draw blood, the port may need to be flushed: Before and after medicines have been given. Before and after blood has been drawn. As part of routine maintenance. Flushing may be recommended every 4-6 weeks. If a constant infusion is running, the port may not need to be flushed. Throw away any syringes in a disposal container that is meant for sharp items (sharps container). You can buy a sharps container from a pharmacy, or you can make one by using an empty hard plastic bottle with a cover. How long will my port stay implanted? The port can stay in for as long as your health care provider thinks it is needed. When it is time for the port to come out, a surgery will be done to remove it. The surgery will be similar to the procedure that was done to putthe port in. Follow these instructions at home:  Flush your port as told by your health care provider. If you need an infusion over several days, follow instructions from your health care provider about how to take   care of your port site. Make sure you: Wash your hands with soap and water before you change your dressing. If soap and water are not available, use alcohol-based hand sanitizer. Change your dressing as told by your health care provider. Place any used dressings or infusion bags into a plastic bag. Throw that bag in the trash. Keep the dressing that covers the needle clean and dry. Do not get it wet. Do not use scissors or sharp objects near the tube. Keep the tube clamped, unless it is being used. Check your port site every day for signs of infection. Check for: Redness, swelling, or  pain. Fluid or blood. Pus or a bad smell. Protect the skin around the port site. Avoid wearing bra straps that rub or irritate the site. Protect the skin around your port from seat belts. Place a soft pad over your chest if needed. Bathe or shower as told by your health care provider. The site may get wet as long as you are not actively receiving an infusion. Return to your normal activities as told by your health care provider. Ask your health care provider what activities are safe for you. Carry a medical alert card or wear a medical alert bracelet at all times. This will let health care providers know that you have an implanted port in case of an emergency. Get help right away if: You have redness, swelling, or pain at the port site. You have fluid or blood coming from your port site. You have pus or a bad smell coming from the port site. You have a fever. Summary Implanted ports are usually placed in the chest for long-term IV access. Follow instructions from your health care provider about flushing the port and changing bandages (dressings). Take care of the area around your port by avoiding clothing that puts pressure on the area, and by watching for signs of infection. Protect the skin around your port from seat belts. Place a soft pad over your chest if needed. Get help right away if you have a fever or you have redness, swelling, pain, drainage, or a bad smell at the port site. This information is not intended to replace advice given to you by your health care provider. Make sure you discuss any questions you have with your healthcare provider. Document Revised: 06/08/2019 Document Reviewed: 06/08/2019 Elsevier Patient Education  2022 [employer]  

## 2020-09-15 NOTE — Progress Notes (Signed)
  [facility] OFFICE PROGRESS NOTE   Diagnosis: Rectal cancer  INTERVAL HISTORY:   Sharon Bennett returns as scheduled.  Overall she feels well.  She continues to note rectal frequency/urgency.  She has occasional nausea.  Objective:  Vital signs in last 24 hours:  Blood pressure 120/77, pulse 91, temperature 97.9 F (36.6 C), temperature source Oral, resp. rate 18, height _0  (1.676 m), weight 163 lb (73.9 kg), last menstrual period 09/09/2020, SpO2 100 %.    HEENT: No thrush or ulcers. Resp: Lungs clear bilaterally. Cardio: Regular rate and rhythm. GI: Abdomen soft and nontender.  No hepatomegaly. Vascular: No leg edema. Skin: Palms without erythema. Port-A-Cath without erythema.   Lab Results:  Lab Results  Component Value Date   WBC 6.8 09/15/2020   HGB 11.9 (L) 09/15/2020   HCT 37.6 09/15/2020   MCV 85.6 09/15/2020   PLT 256 09/15/2020   NEUTROABS 3.6 09/15/2020    Imaging:  No results found.  Medications: I have reviewed the patient's current medications.  Assessment/Plan: Rectal cancer  CT abdomen/pelvis 08/21/2020-large rectal mass measuring 4.4 x 2.6 x 5.5 cm, haziness in the mesorectal fat, obscuration of the fat plane between the mass and the adjacent posterior aspect of the uterus, numerous prominent mesorectal lymph nodes measuring up to 5 mm Colonoscopy 08/23/2020-fungating infiltrative nonobstructing large mass found in the rectum.  Mass was noncircumferential, measured 5 cm in length, 3 cm in diameter and was 7 cm from the anal verge.  No bleeding was present.   Pathology showed invasive colonic adenocarcinoma, moderately differentiated.  No evidence of mismatch repair deficiency. Chest CT 09/01/2020-no evidence of metastatic disease MRI pelvis 09/02/2020-tumor at 10.4 cm from anal verge, 5.9 cm from the internal anal sphincter, T3, approximately 15 greater than 5 mm lymph nodes, N2b, multiple uterine fibroids Cycle 1 FOLFOX 09/15/2020 Rectal  bleeding, change in bowel habits secondary to #1 Uterine fibroids noted on MRI pelvis 09/02/2020 Port-A-Cath placement 09/09/2020, Interventional Radiology    Disposition: Sharon Bennett appears stable.  She is scheduled for cycle 1 neoadjuvant FOLFOX today.  We again reviewed potential toxicities.  She agrees to proceed.  We reviewed the CBC and chemistry panel from today.  Labs adequate to proceed with treatment.  Urine pregnancy test negative.  She will return for lab, follow-up, cycle 2 FOLFOX in 2 weeks.  We are available to see her sooner if needed.    [name] ANP/GNP-BC   09/15/2020  10:40 AM

## 2020-09-15 NOTE — Patient Instructions (Addendum)
The chemotherapy medication bag should finish at 46 hours, 96 hours, or 7 days. For example, if your pump is scheduled for 46 hours and it was put on at 4:00 p.m., it should finish at 2:00 p.m. the day it is scheduled to come off regardless of your appointment time.     Estimated time to finish at 12pm on 09/17/20.   If the display on your pump reads "Low Volume" and it is beeping, take the batteries out of the pump and come to the cancer center for it to be taken off.   If the pump alarms go off prior to the pump reading "Low Volume" then call [phone] and someone can assist you.  If the plunger comes out and the chemotherapy medication is leaking out, please use your home chemo spill kit to clean up the spill. Do NOT use paper towels or other household products.  If you have problems or questions regarding your pump, please call either [phone] (24 hours a day) or the cancer center Monday-Friday 8:00 a.m.- 4:30 p.m. at the clinic number and we will assist you. If you are unable to get assistance, then go to the nearest Emergency Department and ask the staff to contact the IV team for assistance.   [facility]  Discharge Instructions: Thank you for choosing [facility] to provide your oncology and hematology care.   If you have a lab appointment with the [facility], please go directly to the [facility] and check in at the registration area.   Wear comfortable clothing and clothing appropriate for easy access to any Portacath or PICC line.   We strive to give you quality time with your provider. You may need to reschedule your appointment if you arrive late (15 or more minutes).  Arriving late affects you and other patients whose appointments are after yours.  Also, if you miss three or more appointments without notifying the office, you may be dismissed from the clinic at the provider's discretion.      For prescription refill requests,  have your pharmacy contact our office and allow 72 hours for refills to be completed.    Today you received the following chemotherapy and/or immunotherapy agents: oxaliplatin, leucovorin, fluorouracil.      To help prevent nausea and vomiting after your treatment, we encourage you to take your nausea medication as directed.  BELOW ARE SYMPTOMS THAT SHOULD BE REPORTED IMMEDIATELY: *FEVER GREATER THAN 100.4 F (38 C) OR HIGHER *CHILLS OR SWEATING *NAUSEA AND VOMITING THAT IS NOT CONTROLLED WITH YOUR NAUSEA MEDICATION *UNUSUAL SHORTNESS OF BREATH *UNUSUAL BRUISING OR BLEEDING *URINARY PROBLEMS (pain or burning when urinating, or frequent urination) *BOWEL PROBLEMS (unusual diarrhea, constipation, pain near the anus) TENDERNESS IN MOUTH AND THROAT WITH OR WITHOUT PRESENCE OF ULCERS (sore throat, sores in mouth, or a toothache) UNUSUAL RASH, SWELLING OR PAIN  UNUSUAL VAGINAL DISCHARGE OR ITCHING   Items with * indicate a potential emergency and should be followed up as soon as possible or go to the Emergency Department if any problems should occur.  Please show the CHEMOTHERAPY ALERT CARD or IMMUNOTHERAPY ALERT CARD at check-in to the Emergency Department and triage nurse.  Should you have questions after your visit or need to cancel or reschedule your appointment, please contact [facility]  Dept: [phone]  and follow the prompts.  Office hours are 8:00 a.m. to 4:30 p.m. Monday - Friday. Please note that voicemails left after  4:00 p.m. may not be returned until the following business day.  We are closed weekends and major holidays. You have access to a nurse at all times for urgent questions. Please call the main number to the clinic Dept: [phone] and follow the prompts.   For any non-urgent questions, you may also contact your provider using MyChart. We now offer e-Visits for anyone 7 and older to request care online for non-urgent symptoms. For details  visit [email or web address].   Also download the MyChart app! Go to the app store, search "MyChart", open the app, select [facility], and log in with your MyChart username and password.  Due to Covid, a mask is required upon entering the hospital/clinic. If you do not have a mask, one will be given to you upon arrival. For doctor visits, patients may have 1 support person aged 89 or older with them. For treatment visits, patients cannot have anyone with them due to current Covid guidelines and our immunocompromised population.   Oxaliplatin Injection What is this medication? OXALIPLATIN (ox AL i PLA tin) is a chemotherapy drug. It targets fast dividing cells, like cancer cells, and causes these cells to die. This medicine is usedto treat cancers of the colon and rectum, and many other cancers. This medicine may be used for other purposes; ask your health care provider orpharmacist if you have questions. COMMON BRAND NAME(S): Eloxatin What should I tell my care team before I take this medication? They need to know if you have any of these conditions: heart disease history of irregular heartbeat liver disease low blood counts, like white cells, platelets, or red blood cells lung or breathing disease, like asthma take medicines that treat or prevent blood clots tingling of the fingers or toes, or other nerve disorder an unusual or allergic reaction to oxaliplatin, other chemotherapy, other medicines, foods, dyes, or preservatives pregnant or trying to get pregnant breast-feeding How should I use this medication? This drug is given as an infusion into a vein. It is administered in a hospitalor clinic by a specially trained health care professional. Talk to your pediatrician regarding the use of this medicine in children.Special care may be needed. Overdosage: If you think you have taken too much of this medicine contact apoison control center or emergency room at once. NOTE: This medicine is  only for you. Do not share this medicine with others. What if I miss a dose? It is important not to miss a dose. Call your doctor or health careprofessional if you are unable to keep an appointment. What may interact with this medication? Do not take this medicine with any of the following medications: cisapride dronedarone pimozide thioridazine This medicine may also interact with the following medications: aspirin and aspirin-like medicines certain medicines that treat or prevent blood clots like warfarin, apixaban, dabigatran, and rivaroxaban cisplatin cyclosporine diuretics medicines for infection like acyclovir, adefovir, amphotericin B, bacitracin, cidofovir, foscarnet, ganciclovir, gentamicin, pentamidine, vancomycin NSAIDs, medicines for pain and inflammation, like ibuprofen or naproxen other medicines that prolong the QT interval (an abnormal heart rhythm) pamidronate zoledronic acid This list may not describe all possible interactions. Give your health care provider a list of all the medicines, herbs, non-prescription drugs, or dietary supplements you use. Also tell them if you smoke, drink alcohol, or use illegaldrugs. Some items may interact with your medicine. What should I watch for while using this medication? Your condition will be monitored carefully while you are receiving thismedicine. You may need blood work done while you are  taking this medicine. This medicine may make you feel generally unwell. This is not uncommon as chemotherapy can affect healthy cells as well as cancer cells. Report any side effects. Continue your course of treatment even though you feel ill unless yourhealthcare professional tells you to stop. This medicine can make you more sensitive to cold. Do not drink cold drinks or use ice. Cover exposed skin before coming in contact with cold temperatures or cold objects. When out in cold weather wear warm clothing and cover your mouth and nose to warm the  air that goes into your lungs. Tell your doctor if you getsensitive to the cold. Do not become pregnant while taking this medicine or for 9 months after stopping it. Women should inform their health care professional if they wish to become pregnant or think they might be pregnant. Men should not father a child while taking this medicine and for 6 months after stopping it. There is potential for serious side effects to an unborn child. Talk to your health careprofessional for more information. Do not breast-feed a child while taking this medicine or for 3 months afterstopping it. This medicine has caused ovarian failure in some women. This medicine may make it more difficult to get pregnant. Talk to your health care professional if youare concerned about your fertility. This medicine has caused decreased sperm counts in some men. This may make it more difficult to father a child. Talk to your health care professional if youare concerned about your fertility. This medicine may increase your risk of getting an infection. Call your health care professional for advice if you get a fever, chills, or sore throat, or other symptoms of a cold or flu. Do not treat yourself. Try to avoid beingaround people who are sick. Avoid taking medicines that contain aspirin, acetaminophen, ibuprofen, naproxen, or ketoprofen unless instructed by your health care professional.These medicines may hide a fever. Be careful brushing or flossing your teeth or using a toothpick because you may get an infection or bleed more easily. If you have any dental work done, tellyour dentist you are receiving this medicine. What side effects may I notice from receiving this medication? Side effects that you should report to your doctor or health care professionalas soon as possible: allergic reactions like skin rash, itching or hives, swelling of the face, lips, or tongue breathing problems cough low blood counts - this medicine may decrease  the number of white blood cells, red blood cells, and platelets. You may be at increased risk for infections and bleeding nausea, vomiting pain, redness, or irritation at site where injected pain, tingling, numbness in the hands or feet signs and symptoms of bleeding such as bloody or black, tarry stools; red or dark brown urine; spitting up blood or brown material that looks like coffee grounds; red spots on the skin; unusual bruising or bleeding from the eyes, gums, or nose signs and symptoms of a dangerous change in heartbeat or heart rhythm like chest pain; dizziness; fast, irregular heartbeat; palpitations; feeling faint or lightheaded; falls signs and symptoms of infection like fever; chills; cough; sore throat; pain or trouble passing urine signs and symptoms of liver injury like dark yellow or brown urine; general ill feeling or flu-like symptoms; light-colored stools; loss of appetite; nausea; right upper belly pain; unusually weak or tired; yellowing of the eyes or skin signs and symptoms of low red blood cells or anemia such as unusually weak or tired; feeling faint or lightheaded; falls signs and symptoms   of muscle injury like dark urine; trouble passing urine or change in the amount of urine; unusually weak or tired; muscle pain; back pain Side effects that usually do not require medical attention (report to yourdoctor or health care professional if they continue or are bothersome): changes in taste diarrhea gas hair loss loss of appetite mouth sores This list may not describe all possible side effects. Call your doctor for medical advice about side effects. You may report side effects to FDA at1-800-FDA-1088. Where should I keep my medication? This drug is given in a hospital or clinic and will not be stored at home. NOTE: This sheet is a summary. It may not cover all possible information. If you have questions about this medicine, talk to your doctor, pharmacist, orhealth care  provider.  2022 Elsevier/Gold Standard (2018-06-11 12:20:35)  Leucovorin injection What is this medication? LEUCOVORIN (loo koe VOR in) is used to prevent or treat the harmful effects of some medicines. This medicine is used to treat anemia caused by a low amount of folic acid in the body. It is also used with 5-fluorouracil (5-FU) to treatcolon cancer. This medicine may be used for other purposes; ask your health care provider orpharmacist if you have questions. What should I tell my care team before I take this medication? They need to know if you have any of these conditions: anemia from low levels of vitamin B-12 in the blood an unusual or allergic reaction to leucovorin, folic acid, other medicines, foods, dyes, or preservatives pregnant or trying to get pregnant breast-feeding How should I use this medication? This medicine is for injection into a muscle or into a vein. It is given by ahealth care professional in a hospital or clinic setting. Talk to your pediatrician regarding the use of this medicine in children.Special care may be needed. Overdosage: If you think you have taken too much of this medicine contact apoison control center or emergency room at once. NOTE: This medicine is only for you. Do not share this medicine with others. What if I miss a dose? This does not apply. What may interact with this medication? capecitabine fluorouracil phenobarbital phenytoin primidone trimethoprim-sulfamethoxazole This list may not describe all possible interactions. Give your health care provider a list of all the medicines, herbs, non-prescription drugs, or dietary supplements you use. Also tell them if you smoke, drink alcohol, or use illegaldrugs. Some items may interact with your medicine. What should I watch for while using this medication? Your condition will be monitored carefully while you are receiving thismedicine. This medicine may increase the side effects of  5-fluorouracil, 5-FU. Tell your doctor or health care professional if you have diarrhea or mouth sores that donot get better or that get worse. What side effects may I notice from receiving this medication? Side effects that you should report to your doctor or health care professionalas soon as possible: allergic reactions like skin rash, itching or hives, swelling of the face, lips, or tongue breathing problems fever, infection mouth sores unusual bleeding or bruising unusually weak or tired Side effects that usually do not require medical attention (report to yourdoctor or health care professional if they continue or are bothersome): constipation or diarrhea loss of appetite nausea, vomiting This list may not describe all possible side effects. Call your doctor for medical advice about side effects. You may report side effects to FDA at1-800-FDA-1088. Where should I keep my medication? This drug is given in a hospital or clinic and will not be stored at   home. NOTE: This sheet is a summary. It may not cover all possible information. If you have questions about this medicine, talk to your doctor, pharmacist, orhealth care provider.  2022 Elsevier/Gold Standard (2007-07-29 16:50:29)  Fluorouracil, 5-FU injection What is this medication? FLUOROURACIL, 5-FU (flure oh YOOR a sil) is a chemotherapy drug. It slows the growth of cancer cells. This medicine is used to treat many types of cancer like breast cancer, colon or rectal cancer, pancreatic cancer, and stomachcancer. This medicine may be used for other purposes; ask your health care provider orpharmacist if you have questions. COMMON BRAND NAME(S): Adrucil What should I tell my care team before I take this medication? They need to know if you have any of these conditions: blood disorders dihydropyrimidine dehydrogenase (DPD) deficiency infection (especially a virus infection such as chickenpox, cold sores, or herpes) kidney  disease liver disease malnourished, poor nutrition recent or ongoing radiation therapy an unusual or allergic reaction to fluorouracil, other chemotherapy, other medicines, foods, dyes, or preservatives pregnant or trying to get pregnant breast-feeding How should I use this medication? This drug is given as an infusion or injection into a vein. It is administeredin a hospital or clinic by a specially trained health care professional. Talk to your pediatrician regarding the use of this medicine in children.Special care may be needed. Overdosage: If you think you have taken too much of this medicine contact apoison control center or emergency room at once. NOTE: This medicine is only for you. Do not share this medicine with others. What if I miss a dose? It is important not to miss your dose. Call your doctor or health careprofessional if you are unable to keep an appointment. What may interact with this medication? Do not take this medicine with any of the following medications: live virus vaccines This medicine may also interact with the following medications: medicines that treat or prevent blood clots like warfarin, enoxaparin, and dalteparin This list may not describe all possible interactions. Give your health care provider a list of all the medicines, herbs, non-prescription drugs, or dietary supplements you use. Also tell them if you smoke, drink alcohol, or use illegaldrugs. Some items may interact with your medicine. What should I watch for while using this medication? Visit your doctor for checks on your progress. This drug may make you feel generally unwell. This is not uncommon, as chemotherapy can affect healthy cells as well as cancer cells. Report any side effects. Continue your course oftreatment even though you feel ill unless your doctor tells you to stop. In some cases, you may be given additional medicines to help with side effects.Follow all directions for their use. Call  your doctor or health care professional for advice if you get a fever, chills or sore throat, or other symptoms of a cold or flu. Do not treat yourself. This drug decreases your body's ability to fight infections. Try toavoid being around people who are sick. This medicine may increase your risk to bruise or bleed. Call your doctor orhealth care professional if you notice any unusual bleeding. Be careful brushing and flossing your teeth or using a toothpick because you may get an infection or bleed more easily. If you have any dental work done,tell your dentist you are receiving this medicine. Avoid taking products that contain aspirin, acetaminophen, ibuprofen, naproxen, or ketoprofen unless instructed by your doctor. These medicines may hide afever. Do not become pregnant while taking this medicine. Women should inform their doctor if they wish to become   pregnant or think they might be pregnant. There is a potential for serious side effects to an unborn child. Talk to your health care professional or pharmacist for more information. Do not breast-feed aninfant while taking this medicine. Men should inform their doctor if they wish to father a child. This medicinemay lower sperm counts. Do not treat diarrhea with over the counter products. Contact your doctor ifyou have diarrhea that lasts more than 2 days or if it is severe and watery. This medicine can make you more sensitive to the sun. Keep out of the sun. If you cannot avoid being in the sun, wear protective clothing and use sunscreen.Do not use sun lamps or tanning beds/booths. What side effects may I notice from receiving this medication? Side effects that you should report to your doctor or health care professionalas soon as possible: allergic reactions like skin rash, itching or hives, swelling of the face, lips, or tongue low blood counts - this medicine may decrease the number of white blood cells, red blood cells and platelets. You may be at  increased risk for infections and bleeding. signs of infection - fever or chills, cough, sore throat, pain or difficulty passing urine signs of decreased platelets or bleeding - bruising, pinpoint red spots on the skin, black, tarry stools, blood in the urine signs of decreased red blood cells - unusually weak or tired, fainting spells, lightheadedness breathing problems changes in vision chest pain mouth sores nausea and vomiting pain, swelling, redness at site where injected pain, tingling, numbness in the hands or feet redness, swelling, or sores on hands or feet stomach pain unusual bleeding Side effects that usually do not require medical attention (report to yourdoctor or health care professional if they continue or are bothersome): changes in finger or toe nails diarrhea dry or itchy skin hair loss headache loss of appetite sensitivity of eyes to the light stomach upset unusually teary eyes This list may not describe all possible side effects. Call your doctor for medical advice about side effects. You may report side effects to FDA at1-800-FDA-1088. Where should I keep my medication? This drug is given in a hospital or clinic and will not be stored at home. NOTE: This sheet is a summary. It may not cover all possible information. If you have questions about this medicine, talk to your doctor, pharmacist, orhealth care provider.  2022 Elsevier/Gold Standard (2018-12-23 15:00:03)  

## 2020-09-16 ENCOUNTER — Telehealth: Payer: Self-pay

## 2020-09-16 ENCOUNTER — Ambulatory Visit: Payer: 59 | Admitting: Nurse Practitioner

## 2020-09-16 NOTE — Telephone Encounter (Signed)
Called patient for first time chemo follow-up.  Patient stated she has not had any issues and feels ok.  Patient advised to call office with any questions or concerns. Patient reminded of pump stop appointment on 09/17/20 at [facility].  Patient verbalized understanding.

## 2020-09-17 ENCOUNTER — Other Ambulatory Visit: Payer: Self-pay

## 2020-09-17 ENCOUNTER — Inpatient Hospital Stay: Payer: 59

## 2020-09-17 NOTE — Patient Instructions (Signed)
Tunneled Central Venous Catheter Flushing Guide  It is important to flush your tunneled central venous catheter each time you use it, both before and after you use it. Flushing your catheter will helpprevent it from clogging. What are the risks? Risks may include: Infection. Air getting into the catheter and bloodstream. Supplies needed: A clean pair of gloves. A disinfecting wipe. Use an alcohol wipe, chlorhexidine wipe, or iodine wipe as told by your health care provider. A 10 mL syringe that has been prefilled with saline solution. An empty 10 mL syringe, if a substance called heparin was injected into your catheter. How to flush your catheter When you flush your catheter, make sure you follow any specific instructions from your health care provider or the manufacturer. These are generalguidelines. Flushing your catheter before use If there is heparin in your catheter: Wash your hands with soap and water. Put on gloves. Scrub the injection cap for a minimum of 15 seconds with a disinfecting wipe. Unclamp the catheter. Attach the empty syringe to the injection cap. Pull the syringe plunger back and withdraw 10 mL of blood. Place the syringe into an appropriate waste container. Scrub the injection cap for 15 seconds with a disinfecting wipe. Attach the prefilled syringe to the injection cap. Flush the catheter by pushing the plunger forward until all the liquid from the syringe is in the catheter. Remove the syringe from the injection cap. Clamp the catheter. If there is no heparin in your catheter: Wash your hands with soap and water. Put on gloves. Scrub the injection cap for 15 seconds with a disinfecting wipe. Unclamp the catheter. Attach the prefilled syringe to the injection cap. Flush the catheter by pushing the plunger forward until 5 mL of the liquid from the syringe is in the catheter. Pull back on the syringe until you see blood in the catheter. If you have been asked  to collect any blood, follow your health care provider's instructions. Otherwise, flush the catheter with the rest of the solution from the syringe. Remove the syringe from the injection cap. Clamp the catheter.  Flushing your catheter after use Wash your hands with soap and water. Put on gloves. Scrub the injection cap for 15 seconds with a disinfecting wipe. Unclamp the catheter. Attach the prefilled syringe to the injection cap. Flush the catheter by pushing the plunger forward until all of the liquid from the syringe is in the catheter. Remove the syringe from the injection cap. Clamp the catheter. Problems and solutions If blood cannot be completely cleared from the injection cap, you may need to have the injection cap replaced. If the catheter is difficult to flush, use the pulsing method. The pulsing method involves pushing only a few milliliters of solution into the catheter at a time and pausing between pushes. If you do not see blood in the catheter when you pull back on the syringe, change your body position, such as by raising your arms above your head. Take a deep breath and cough. Then, pull back on the syringe. If you still do not see blood, flush the catheter with a small amount of solution. Then, change positions again and take a breath or cough. Pull back on the syringe again. If you still do not see blood, finish flushing the catheter and contact your health care provider. Do not use your catheter until your health care provider says it is okay. General tips Have someone help you flush your catheter, if possible. Do not force fluid through  your catheter. Do not use a syringe that is larger or smaller than 10 mL. Using a smaller syringe can make the catheter burst. Do not use your catheter without flushing it first if it has heparin in it. Contact a health care provider if: You cannot see any blood in the catheter when you flush it before using it. Your catheter is difficult  to flush. Get help right away if: You cannot flush the catheter. The catheter leaks when you flush it or when there is fluid in it. There are cracks or breaks in the catheter. Summary It is important to flush your tunneled central venous catheter each time you use it, both before and after you use it. Scrub the injection cap for 15 seconds with a disinfecting wipe before and after you flush it. When you flush your catheter, make sure you follow any specific instructions from your health care provider or the manufacturer. Get help right away if you cannot flush the catheter. This information is not intended to replace advice given to you by your health care provider. Make sure you discuss any questions you have with your healthcare provider. Document Revised: 04/02/2019 Document Reviewed: 04/09/2018 Elsevier Patient Education  2022 [place] Insertion, Care After This sheet gives you information about how to care for yourself after your procedure. Your health care provider may also give you more specific instructions. If you have problems or questions, contact your health careprovider. What can I expect after the procedure? After the procedure, it is common to have: Discomfort at the port insertion site. Bruising on the skin over the port. This should improve over 3-4 days. Follow these instructions at home: [facility] care After your port is placed, you will get a manufacturer's information card. The card has information about your port. Keep this card with you at all times. Take care of the port as told by your health care provider. Ask your health care provider if you or a family member can get training for taking care of the port at home. A home health care nurse may also take care of the port. Make sure to remember what type of port you have. Incision care     Follow instructions from your health care provider about how to take care of your port insertion site. Make sure  you: Wash your hands with soap and water before and after you change your bandage (dressing). If soap and water are not available, use hand sanitizer. Change your dressing as told by your health care provider. Leave stitches (sutures), skin glue, or adhesive strips in place. These skin closures may need to stay in place for 2 weeks or longer. If adhesive strip edges start to loosen and curl up, you may trim the loose edges. Do not remove adhesive strips completely unless your health care provider tells you to do that. Check your port insertion site every day for signs of infection. Check for: Redness, swelling, or pain. Fluid or blood. Warmth. Pus or a bad smell. Activity Return to your normal activities as told by your health care provider. Ask your health care provider what activities are safe for you. Do not lift anything that is heavier than 10 lb (4.5 kg), or the limit that you are told, until your health care provider says that it is safe. General instructions Take over-the-counter and prescription medicines only as told by your health care provider. Do not take baths, swim, or use a hot tub until your health care  provider approves. Ask your health care provider if you may take showers. You may only be allowed to take sponge baths. Do not drive for 24 hours if you were given a sedative during your procedure. Wear a medical alert bracelet in case of an emergency. This will tell any health care providers that you have a port. Keep all follow-up visits as told by your health care provider. This is important. Contact a health care provider if: You cannot flush your port with saline as directed, or you cannot draw blood from the port. You have a fever or chills. You have redness, swelling, or pain around your port insertion site. You have fluid or blood coming from your port insertion site. Your port insertion site feels warm to the touch. You have pus or a bad smell coming from the port  insertion site. Get help right away if: You have chest pain or shortness of breath. You have bleeding from your port that you cannot control. Summary Take care of the port as told by your health care provider. Keep the manufacturer's information card with you at all times. Change your dressing as told by your health care provider. Contact a health care provider if you have a fever or chills or if you have redness, swelling, or pain around your port insertion site. Keep all follow-up visits as told by your health care provider. This information is not intended to replace advice given to you by your health care provider. Make sure you discuss any questions you have with your healthcare provider. Document Revised: 08/20/2017 Document Reviewed: 08/20/2017 Elsevier Patient Education  [place].

## 2020-09-22 ENCOUNTER — Other Ambulatory Visit: Payer: Self-pay

## 2020-09-22 ENCOUNTER — Encounter (HOSPITAL_BASED_OUTPATIENT_CLINIC_OR_DEPARTMENT_OTHER): Payer: Self-pay | Admitting: Family Medicine

## 2020-09-22 ENCOUNTER — Ambulatory Visit (HOSPITAL_BASED_OUTPATIENT_CLINIC_OR_DEPARTMENT_OTHER): Payer: 59 | Admitting: Family Medicine

## 2020-09-22 DIAGNOSIS — D259 Leiomyoma of uterus, unspecified: Secondary | ICD-10-CM | POA: Insufficient documentation

## 2020-09-22 DIAGNOSIS — C20 Malignant neoplasm of rectum: Secondary | ICD-10-CM

## 2020-09-22 NOTE — Assessment & Plan Note (Addendum)
Presently doing well Has completed 1 cycle of FOLFOX, cycle 2 in 1 week Continue follow-up with specialists

## 2020-09-22 NOTE — Patient Instructions (Addendum)
  Medication Instructions:  Your physician recommends that you continue on your current medications as directed. Please refer to the Current Medication list given to you today. --If you need a refill on any your medications before your next appointment, please call your pharmacy first. If no refills are authorized on file call the office.--  Follow-Up: Your next appointment:   Your physician recommends that you schedule a follow-up appointment in: 3-4 MONTHS with Dr. de [place]  Thanks for letting us be apart of your health journey!!  Primary Care and Sports Medicine   Dr. Arlina Robes [place]   We encourage you to activate your patient portal called "MyChart".  Sign up information is provided on this After Visit Summary.  MyChart is used to connect with patients for Virtual Visits (Telemedicine).  Patients are able to view lab/test results, encounter notes, upcoming appointments, etc.  Non-urgent messages can be sent to your provider as well. To learn more about what you can do with MyChart, please visit --  [email or web address].

## 2020-09-22 NOTE — Assessment & Plan Note (Signed)
Has been referred to GYN/ONC for further evaluation and discussion regarding possible con concurrent hysterectomy at time of surgical resection of colon cancer Continue follow-up with specialist to discuss treatment options

## 2020-09-22 NOTE — Progress Notes (Signed)
New Patient Office Visit  Subjective:  Patient ID: Sharon Bennett, female    DOB: 1980/04/22  Age: 40 y.o. MRN: [id number]  CC:  Chief Complaint  Patient presents with   Establish Care    No Previous PCP - Patient was seeing OB for "Primary Care". [facility]. Patient has no specific concerns or complaints today    HPI Sharon Bennett is a 40 year old female presenting to establish in clinic.  Past medical history is significant for recent diagnosis of colon cancer.  Colon cancer: Initially, patient was having symptoms of hematochezia, fatigue.  The symptoms led to her presenting to the emergency department and subsequently had a CT scan performed.  CT scan on 08/21/2020 showed large rectal mass and subsequent colonoscopy on 08/23/2020 showed infiltrative nonobstructing large mass in the rectum.  She has had consultation with oncology -cycle 1 of FOLFOX was started on 09/15/2020.  She is scheduled for follow-up to proceed with cycle 2 FOLFOX.  Uterine fibroids: Patient with known uterine fibroids and is currently in process of having these evaluated and discussing options with specialist regarding potential of concurrent hysterectomy at time of colon cancer resection.  Patient decays that she does have an adequate support group around her including family, friends, pastor and church. Patient works as an Therapist, sports at [employer].  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  Past Medical History:  Diagnosis Date   Family history of brain cancer    Family history of breast cancer    Family history of pancreatic cancer     Past Surgical History:  Procedure Laterality Date   IR IMAGING GUIDED PORT INSERTION  09/09/2020    Family History  Problem Relation Age of Onset   Hypertension Mother    Heart failure Father    Hypertension Father    Pancreatic cancer Maternal Grandfather        dx 81s   Brain cancer Paternal Grandmother        dx >50   Breast cancer  Other 89       MGF's sister    Social History   Socioeconomic History   Marital status: Single    Spouse name: Not on file   Number of children: Not on file   Years of education: Not on file   Highest education level: Not on file  Occupational History   Not on file  Tobacco Use   Smoking status: Never   Smokeless tobacco: Never  Vaping Use   Vaping Use: Never used  Substance and Sexual Activity   Alcohol use: Not on file    Comment: occasionally   Drug use: Not on file   Sexual activity: Yes  Other Topics Concern   Not on file  Social History Narrative   Not on file   Social Determinants of Health   Financial Resource Strain: Not on file  Food Insecurity: Not on file  Transportation Needs: Not on file  Physical Activity: Not on file  Stress: Not on file  Social Connections: Not on file  Intimate Partner Violence: Not on file    Objective:   Today's Vitals: BP 122/90   Pulse 96   Ht '5\' 6"'$  (1.676 m)   Wt 161 lb (73 kg)   LMP 09/09/2020 Comment: no real periods  due to contraceptive  SpO2 98%   BMI 25.99 kg/m   Physical Exam  Pleasant 40 year old female in no acute distress Cardiovascular exam with  regular rate and rhythm, no murmurs appreciated Lungs clear to auscultation bilaterally  Assessment & Plan:   Problem List Items Addressed This Visit       Digestive   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   [START ON 09/23/2020] lidocaine-prilocaine (EMLA) cream  Apply 1 application topically as needed. Apply 1/2 tablespoon to port site and cover with plastic wrap 2 hours prior to stick to numb site   ondansetron (ZOFRAN) 8 MG tablet Take 1 tablet (8 mg total) by mouth every 8 (eight) hours as needed for nausea or vomiting.   prochlorperazine (COMPAZINE) 10 MG tablet Take 1 tablet (10 mg total) by mouth every 6 (six) hours as needed for nausea or vomiting.   [DISCONTINUED] SLYND 4 MG TABS Take 1 tablet by mouth daily.   No facility-administered encounter medications on file as of 09/22/2020.    Follow-up: Return in about 4 months (around 01/22/2021).  Plan for follow-up to monitor progress with above  Mare Ludtke J De [place], MD

## 2020-09-25 ENCOUNTER — Other Ambulatory Visit: Payer: Self-pay | Admitting: Oncology

## 2020-09-26 ENCOUNTER — Telehealth: Payer: Self-pay

## 2020-09-26 NOTE — Telephone Encounter (Signed)
TC from Pt stating she started experiencing abdominal pain since yesterday. Pt stated at first the pain was upper abdominal pain but today it feels like the lower quadrants as well. Informed Pt that one of the side effects of oxaliplatin is abdominal cramping. Pt. Stated she had a small bowel movement no visible blood.Pt stated she took tylenol 1000 mg and that did not relieve the pain.Discussed with [name] his recommendation is for Pt to take miralax and colace to see if that works to move her bowels and He will also call in a stronger pain medication to the pharmacy. Pt informed to return call to office if pain is not relieved with these remedies. Pt verbalize understanding. No further problems or concerns noted.

## 2020-09-28 ENCOUNTER — Other Ambulatory Visit: Payer: 59

## 2020-09-28 ENCOUNTER — Ambulatory Visit: Payer: 59 | Admitting: Oncology

## 2020-09-28 ENCOUNTER — Ambulatory Visit: Payer: 59

## 2020-09-29 ENCOUNTER — Other Ambulatory Visit: Payer: Self-pay

## 2020-09-29 ENCOUNTER — Inpatient Hospital Stay (HOSPITAL_BASED_OUTPATIENT_CLINIC_OR_DEPARTMENT_OTHER): Payer: 59 | Admitting: Oncology

## 2020-09-29 ENCOUNTER — Inpatient Hospital Stay: Payer: 59

## 2020-09-29 ENCOUNTER — Ambulatory Visit: Payer: 59 | Admitting: Nurse Practitioner

## 2020-09-29 ENCOUNTER — Other Ambulatory Visit: Payer: 59

## 2020-09-29 ENCOUNTER — Ambulatory Visit: Payer: 59

## 2020-09-29 VITALS — BP 131/86 | HR 89 | Temp 98.5°F | Resp 20 | Ht 66.0 in | Wt 162.2 lb

## 2020-09-29 DIAGNOSIS — C20 Malignant neoplasm of rectum: Secondary | ICD-10-CM

## 2020-09-29 DIAGNOSIS — Z5111 Encounter for antineoplastic chemotherapy: Secondary | ICD-10-CM | POA: Diagnosis not present

## 2020-09-29 LAB — CBC WITH DIFFERENTIAL (CANCER CENTER ONLY)
Abs Immature Granulocytes: 0.01 10*3/uL (ref 0.00–0.07)
Basophils Absolute: 0 10*3/uL (ref 0.0–0.1)
Basophils Relative: 0 %
Eosinophils Absolute: 0.1 10*3/uL (ref 0.0–0.5)
Eosinophils Relative: 2 %
HCT: 36.1 % (ref 36.0–46.0)
Hemoglobin: 11.6 g/dL — ABNORMAL LOW (ref 12.0–15.0)
Immature Granulocytes: 0 %
Lymphocytes Relative: 52 %
Lymphs Abs: 3.5 10*3/uL (ref 0.7–4.0)
MCH: 27.5 pg (ref 26.0–34.0)
MCHC: 32.1 g/dL (ref 30.0–36.0)
MCV: 85.5 fL (ref 80.0–100.0)
Monocytes Absolute: 0.8 10*3/uL (ref 0.1–1.0)
Monocytes Relative: 12 %
Neutro Abs: 2.3 10*3/uL (ref 1.7–7.7)
Neutrophils Relative %: 34 %
Platelet Count: 267 10*3/uL (ref 150–400)
RBC: 4.22 MIL/uL (ref 3.87–5.11)
RDW: 13.5 % (ref 11.5–15.5)
WBC Count: 6.8 10*3/uL (ref 4.0–10.5)
nRBC: 0 % (ref 0.0–0.2)

## 2020-09-29 LAB — CMP (CANCER CENTER ONLY)
ALT: 13 U/L (ref 0–44)
AST: 13 U/L — ABNORMAL LOW (ref 15–41)
Albumin: 3.5 g/dL (ref 3.5–5.0)
Alkaline Phosphatase: 53 U/L (ref 38–126)
Anion gap: 9 (ref 5–15)
BUN: 10 mg/dL (ref 6–20)
CO2: 25 mmol/L (ref 22–32)
Calcium: 8.3 mg/dL — ABNORMAL LOW (ref 8.9–10.3)
Chloride: 108 mmol/L (ref 98–111)
Creatinine: 0.59 mg/dL (ref 0.44–1.00)
GFR, Estimated: >60 mL/min (ref >60)
Glucose, Bld: 113 mg/dL — ABNORMAL HIGH (ref 70–99)
Potassium: 3.4 mmol/L — ABNORMAL LOW (ref 3.5–5.1)
Sodium: 142 mmol/L (ref 135–145)
Total Bilirubin: 0.3 mg/dL (ref 0.3–1.2)
Total Protein: 6.5 g/dL (ref 6.5–8.1)

## 2020-09-29 MED ORDER — PANTOPRAZOLE SODIUM 40 MG PO TBEC: 40.0000 mg | DELAYED_RELEASE_TABLET | Freq: Every day | ORAL | 0 refills | Status: DC

## 2020-09-29 MED ORDER — SODIUM CHLORIDE 0.9 % IV SOLN
2400.0000 mg/m2 | INTRAVENOUS | Status: DC
  Administered 2020-09-29: 4450 mg via INTRAVENOUS
  Filled 2020-09-29: qty 89

## 2020-09-29 MED ORDER — LEUCOVORIN CALCIUM INJECTION 350 MG
400.0000 mg/m2 | Freq: Once | INTRAVENOUS | Status: AC
  Administered 2020-09-29: 744 mg via INTRAVENOUS
  Filled 2020-09-29: qty 37.2

## 2020-09-29 MED ORDER — OXALIPLATIN CHEMO INJECTION 100 MG/20ML
85.0000 mg/m2 | Freq: Once | INTRAVENOUS | Status: AC
  Administered 2020-09-29: 160 mg via INTRAVENOUS
  Filled 2020-09-29: qty 32

## 2020-09-29 MED ORDER — FLUOROURACIL CHEMO INJECTION 2.5 GM/50ML
400.0000 mg/m2 | Freq: Once | INTRAVENOUS | Status: AC
  Administered 2020-09-29: 750 mg via INTRAVENOUS
  Filled 2020-09-29: qty 15

## 2020-09-29 MED ORDER — SODIUM CHLORIDE 0.9 % IV SOLN
10.0000 mg | Freq: Once | INTRAVENOUS | Status: AC
  Administered 2020-09-29: 10 mg via INTRAVENOUS
  Filled 2020-09-29: qty 1

## 2020-09-29 MED ORDER — PALONOSETRON HCL INJECTION 0.25 MG/5ML
0.2500 mg | Freq: Once | INTRAVENOUS | Status: AC
  Administered 2020-09-29: 0.25 mg via INTRAVENOUS

## 2020-09-29 MED ORDER — TRAMADOL HCL 50 MG PO TABS: 50.0000 mg | ORAL_TABLET | Freq: Four times a day (QID) | ORAL | 0 refills | Status: DC | PRN

## 2020-09-29 MED ORDER — DEXTROSE 5 % IV SOLN: Freq: Once | INTRAVENOUS | Status: AC

## 2020-09-29 NOTE — Patient Instructions (Signed)
[facility]  Discharge Instructions: Thank you for choosing [facility] to provide your oncology and hematology care.   If you have a lab appointment with the [facility], please go directly to the [facility] and check in at the registration area.   Wear comfortable clothing and clothing appropriate for easy access to any Portacath or PICC line.   We strive to give you quality time with your provider. You may need to reschedule your appointment if you arrive late (15 or more minutes).  Arriving late affects you and other patients whose appointments are after yours.  Also, if you miss three or more appointments without notifying the office, you may be dismissed from the clinic at the provider's discretion.      For prescription refill requests, have your pharmacy contact our office and allow 72 hours for refills to be completed.    Today you received the following chemotherapy and/or immunotherapy agents oxaliplatin, leucovorin, 5 fu       To help prevent nausea and vomiting after your treatment, we encourage you to take your nausea medication as directed.  BELOW ARE SYMPTOMS THAT SHOULD BE REPORTED IMMEDIATELY: *FEVER GREATER THAN 100.4 F (38 C) OR HIGHER *CHILLS OR SWEATING *NAUSEA AND VOMITING THAT IS NOT CONTROLLED WITH YOUR NAUSEA MEDICATION *UNUSUAL SHORTNESS OF BREATH *UNUSUAL BRUISING OR BLEEDING *URINARY PROBLEMS (pain or burning when urinating, or frequent urination) *BOWEL PROBLEMS (unusual diarrhea, constipation, pain near the anus) TENDERNESS IN MOUTH AND THROAT WITH OR WITHOUT PRESENCE OF ULCERS (sore throat, sores in mouth, or a toothache) UNUSUAL RASH, SWELLING OR PAIN  UNUSUAL VAGINAL DISCHARGE OR ITCHING   Items with * indicate a potential emergency and should be followed up as soon as possible or go to the Emergency Department if any problems should occur.  Please show the CHEMOTHERAPY ALERT CARD or IMMUNOTHERAPY ALERT  CARD at check-in to the Emergency Department and triage nurse.  Should you have questions after your visit or need to cancel or reschedule your appointment, please contact [facility]  Dept: [phone]  and follow the prompts.  Office hours are 8:00 a.m. to 4:30 p.m. Monday - Friday. Please note that voicemails left after 4:00 p.m. may not be returned until the following business day.  We are closed weekends and major holidays. You have access to a nurse at all times for urgent questions. Please call the main number to the clinic Dept: [phone] and follow the prompts.   For any non-urgent questions, you may also contact your provider using MyChart. We now offer e-Visits for anyone 67 and older to request care online for non-urgent symptoms. For details visit [email or web address].   Also download the MyChart app! Go to the app store, search "MyChart", open the app, select [facility], and log in with your MyChart username and password.  Due to Covid, a mask is required upon entering the hospital/clinic. If you do not have a mask, one will be given to you upon arrival. For doctor visits, patients may have 1 support person aged 25 or older with them. For treatment visits, patients cannot have anyone with them due to current Covid guidelines and our immunocompromised population.   Oxaliplatin Injection What is this medication? OXALIPLATIN (ox AL i PLA tin) is a chemotherapy drug. It targets fast dividing cells, like cancer cells, and causes these cells to die. This medicine is usedto treat cancers of the colon and rectum, and many other cancers.  This medicine may be used for other purposes; ask your health care provider orpharmacist if you have questions. COMMON BRAND NAME(S): Eloxatin What should I tell my care team before I take this medication? They need to know if you have any of these conditions: heart disease history of irregular heartbeat liver disease low  blood counts, like white cells, platelets, or red blood cells lung or breathing disease, like asthma take medicines that treat or prevent blood clots tingling of the fingers or toes, or other nerve disorder an unusual or allergic reaction to oxaliplatin, other chemotherapy, other medicines, foods, dyes, or preservatives pregnant or trying to get pregnant breast-feeding How should I use this medication? This drug is given as an infusion into a vein. It is administered in a hospitalor clinic by a specially trained health care professional. Talk to your pediatrician regarding the use of this medicine in children.Special care may be needed. Overdosage: If you think you have taken too much of this medicine contact apoison control center or emergency room at once. NOTE: This medicine is only for you. Do not share this medicine with others. What if I miss a dose? It is important not to miss a dose. Call your doctor or health careprofessional if you are unable to keep an appointment. What may interact with this medication? Do not take this medicine with any of the following medications: cisapride dronedarone pimozide thioridazine This medicine may also interact with the following medications: aspirin and aspirin-like medicines certain medicines that treat or prevent blood clots like warfarin, apixaban, dabigatran, and rivaroxaban cisplatin cyclosporine diuretics medicines for infection like acyclovir, adefovir, amphotericin B, bacitracin, cidofovir, foscarnet, ganciclovir, gentamicin, pentamidine, vancomycin NSAIDs, medicines for pain and inflammation, like ibuprofen or naproxen other medicines that prolong the QT interval (an abnormal heart rhythm) pamidronate zoledronic acid This list may not describe all possible interactions. Give your health care provider a list of all the medicines, herbs, non-prescription drugs, or dietary supplements you use. Also tell them if you smoke, drink alcohol,  or use illegaldrugs. Some items may interact with your medicine. What should I watch for while using this medication? Your condition will be monitored carefully while you are receiving thismedicine. You may need blood work done while you are taking this medicine. This medicine may make you feel generally unwell. This is not uncommon as chemotherapy can affect healthy cells as well as cancer cells. Report any side effects. Continue your course of treatment even though you feel ill unless yourhealthcare professional tells you to stop. This medicine can make you more sensitive to cold. Do not drink cold drinks or use ice. Cover exposed skin before coming in contact with cold temperatures or cold objects. When out in cold weather wear warm clothing and cover your mouth and nose to warm the air that goes into your lungs. Tell your doctor if you getsensitive to the cold. Do not become pregnant while taking this medicine or for 9 months after stopping it. Women should inform their health care professional if they wish to become pregnant or think they might be pregnant. Men should not father a child while taking this medicine and for 6 months after stopping it. There is potential for serious side effects to an unborn child. Talk to your health careprofessional for more information. Do not breast-feed a child while taking this medicine or for 3 months afterstopping it. This medicine has caused ovarian failure in some women. This medicine may make it more difficult to get pregnant. Talk to  your health care professional if youare concerned about your fertility. This medicine has caused decreased sperm counts in some men. This may make it more difficult to father a child. Talk to your health care professional if [place] concerned about your fertility. This medicine may increase your risk of getting an infection. Call your health care professional for advice if you get a fever, chills, or sore throat, or other symptoms  of a cold or flu. Do not treat yourself. Try to avoid beingaround people who are sick. Avoid taking medicines that contain aspirin, acetaminophen, ibuprofen, naproxen, or ketoprofen unless instructed by your health care professional.These medicines may hide a fever. Be careful brushing or flossing your teeth or using a toothpick because you may get an infection or bleed more easily. If you have any dental work done, Primary school teacher you are receiving this medicine. What side effects may I notice from receiving this medication? Side effects that you should report to your doctor or health care professionalas soon as possible: allergic reactions like skin rash, itching or hives, swelling of the face, lips, or tongue breathing problems cough low blood counts - this medicine may decrease the number of white blood cells, red blood cells, and platelets. You may be at increased risk for infections and bleeding nausea, vomiting pain, redness, or irritation at site where injected pain, tingling, numbness in the hands or feet signs and symptoms of bleeding such as bloody or black, tarry stools; red or dark brown urine; spitting up blood or brown material that looks like coffee grounds; red spots on the skin; unusual bruising or bleeding from the eyes, gums, or nose signs and symptoms of a dangerous change in heartbeat or heart rhythm like chest pain; dizziness; fast, irregular heartbeat; palpitations; feeling faint or lightheaded; falls signs and symptoms of infection like fever; chills; cough; sore throat; pain or trouble passing urine signs and symptoms of liver injury like dark yellow or brown urine; general ill feeling or flu-like symptoms; light-colored stools; loss of appetite; nausea; right upper belly pain; unusually weak or tired; yellowing of the eyes or skin signs and symptoms of low red blood cells or anemia such as unusually weak or tired; feeling faint or lightheaded; falls signs and symptoms of  muscle injury like dark urine; trouble passing urine or change in the amount of urine; unusually weak or tired; muscle pain; back pain Side effects that usually do not require medical attention (report to yourdoctor or health care professional if they continue or are bothersome): changes in taste diarrhea gas hair loss loss of appetite mouth sores This list may not describe all possible side effects. Call your doctor for medical advice about side effects. You may report side effects to FDA at1-800-FDA-1088. Where should I keep my medication? This drug is given in a hospital or clinic and will not be stored at home. NOTE: This sheet is a summary. It may not cover all possible information. If you have questions about this medicine, talk to your doctor, pharmacist, orhealth care provider.  2022 Elsevier/Gold Standard (2018-06-11 12:20:35)  Leucovorin injection What is this medication? LEUCOVORIN (loo koe VOR in) is used to prevent or treat the harmful effects of some medicines. This medicine is used to treat anemia caused by a low amount of folic acid in the body. It is also used with 5-fluorouracil (5-FU) to treatcolon cancer. This medicine may be used for other purposes; ask your health care provider orpharmacist if you have questions. What should I tell my  care team before I take this medication? They need to know if you have any of these conditions: anemia from low levels of vitamin B-12 in the blood an unusual or allergic reaction to leucovorin, folic acid, other medicines, foods, dyes, or preservatives pregnant or trying to get pregnant breast-feeding How should I use this medication? This medicine is for injection into a muscle or into a vein. It is given by ahealth care professional in a hospital or clinic setting. Talk to your pediatrician regarding the use of this medicine in children.Special care may be needed. Overdosage: If you think you have taken too much of this medicine  contact apoison control center or emergency room at once. NOTE: This medicine is only for you. Do not share this medicine with others. What if I miss a dose? This does not apply. What may interact with this medication? capecitabine fluorouracil phenobarbital phenytoin primidone trimethoprim-sulfamethoxazole This list may not describe all possible interactions. Give your health care provider a list of all the medicines, herbs, non-prescription drugs, or dietary supplements you use. Also tell them if you smoke, drink alcohol, or use illegaldrugs. Some items may interact with your medicine. What should I watch for while using this medication? Your condition will be monitored carefully while you are receiving thismedicine. This medicine may increase the side effects of 5-fluorouracil, 5-FU. Tell your doctor or health care professional if you have diarrhea or mouth sores that donot get better or that get worse. What side effects may I notice from receiving this medication? Side effects that you should report to your doctor or health care professionalas soon as possible: allergic reactions like skin rash, itching or hives, swelling of the face, lips, or tongue breathing problems fever, infection mouth sores unusual bleeding or bruising unusually weak or tired Side effects that usually do not require medical attention (report to yourdoctor or health care professional if they continue or are bothersome): constipation or diarrhea loss of appetite nausea, vomiting This list may not describe all possible side effects. Call your doctor for medical advice about side effects. You may report side effects to FDA at1-800-FDA-1088. Where should I keep my medication? This drug is given in a hospital or clinic and will not be stored at home. NOTE: This sheet is a summary. It may not cover all possible information. If you have questions about this medicine, talk to your doctor, pharmacist, orhealth care  provider.  2022 Elsevier/Gold Standard (2007-07-29 16:50:29)   Fluorouracil, 5-FU injection What is this medication? FLUOROURACIL, 5-FU (flure oh YOOR a sil) is a chemotherapy drug. It slows the growth of cancer cells. This medicine is used to treat many types of cancer like breast cancer, colon or rectal cancer, pancreatic cancer, and stomachcancer. This medicine may be used for other purposes; ask your health care provider orpharmacist if you have questions. COMMON BRAND NAME(S): Adrucil What should I tell my care team before I take this medication? They need to know if you have any of these conditions: blood disorders dihydropyrimidine dehydrogenase (DPD) deficiency infection (especially a virus infection such as chickenpox, cold sores, or herpes) kidney disease liver disease malnourished, poor nutrition recent or ongoing radiation therapy an unusual or allergic reaction to fluorouracil, other chemotherapy, other medicines, foods, dyes, or preservatives pregnant or trying to get pregnant breast-feeding How should I use this medication? This drug is given as an infusion or injection into a vein. It is administeredin a hospital or clinic by a specially trained health care professional. Talk to your  pediatrician regarding the use of this medicine in children.Special care may be needed. Overdosage: If you think you have taken too much of this medicine contact apoison control center or emergency room at once. NOTE: This medicine is only for you. Do not share this medicine with others. What if I miss a dose? It is important not to miss your dose. Call your doctor or health careprofessional if you are unable to keep an appointment. What may interact with this medication? Do not take this medicine with any of the following medications: live virus vaccines This medicine may also interact with the following medications: medicines that treat or prevent blood clots like warfarin, enoxaparin,  and dalteparin This list may not describe all possible interactions. Give your health care provider a list of all the medicines, herbs, non-prescription drugs, or dietary supplements you use. Also tell them if you smoke, drink alcohol, or use illegaldrugs. Some items may interact with your medicine. What should I watch for while using this medication? Visit your doctor for checks on your progress. This drug may make you feel generally unwell. This is not uncommon, as chemotherapy can affect healthy cells as well as cancer cells. Report any side effects. Continue your course oftreatment even though you feel ill unless your doctor tells you to stop. In some cases, you may be given additional medicines to help with side effects.Follow all directions for their use. Call your doctor or health care professional for advice if you get a fever, chills or sore throat, or other symptoms of a cold or flu. Do not treat yourself. This drug decreases your body's ability to fight infections. Try toavoid being around people who are sick. This medicine may increase your risk to bruise or bleed. Call your doctor orhealth care professional if you notice any unusual bleeding. Be careful brushing and flossing your teeth or using a toothpick because you may get an infection or bleed more easily. If you have any dental work done,tell your dentist you are receiving this medicine. Avoid taking products that contain aspirin, acetaminophen, ibuprofen, naproxen, or ketoprofen unless instructed by your doctor. These medicines may hide afever. Do not become pregnant while taking this medicine. Women should inform their doctor if they wish to become pregnant or think they might be pregnant. There is a potential for serious side effects to an unborn child. Talk to your health care professional or pharmacist for more information. Do not breast-feed aninfant while taking this medicine. Men should inform their doctor if they wish to father a  child. This medicinemay lower sperm counts. Do not treat diarrhea with over the counter products. Contact your doctor ifyou have diarrhea that lasts more than 2 days or if it is severe and watery. This medicine can make you more sensitive to the sun. Keep out of the sun. If you cannot avoid being in the sun, wear protective clothing and use sunscreen.Do not use sun lamps or tanning beds/booths. What side effects may I notice from receiving this medication? Side effects that you should report to your doctor or health care professionalas soon as possible: allergic reactions like skin rash, itching or hives, swelling of the face, lips, or tongue low blood counts - this medicine may decrease the number of white blood cells, red blood cells and platelets. You may be at increased risk for infections and bleeding. signs of infection - fever or chills, cough, sore throat, pain or difficulty passing urine signs of decreased platelets or bleeding - bruising, pinpoint red  spots on the skin, black, tarry stools, blood in the urine signs of decreased red blood cells - unusually weak or tired, fainting spells, lightheadedness breathing problems changes in vision chest pain mouth sores nausea and vomiting pain, swelling, redness at site where injected pain, tingling, numbness in the hands or feet redness, swelling, or sores on hands or feet stomach pain unusual bleeding Side effects that usually do not require medical attention (report to yourdoctor or health care professional if they continue or are bothersome): changes in finger or toe nails diarrhea dry or itchy skin hair loss headache loss of appetite sensitivity of eyes to the light stomach upset unusually teary eyes This list may not describe all possible side effects. Call your doctor for medical advice about side effects. You may report side effects to FDA at1-800-FDA-1088. Where should I keep my medication? This drug is given in a hospital  or clinic and will not be stored at home. NOTE: This sheet is a summary. It may not cover all possible information. If you have questions about this medicine, talk to your doctor, pharmacist, orhealth care provider.  2022 Elsevier/Gold Standard (2018-12-23 15:00:03)

## 2020-09-29 NOTE — Progress Notes (Deleted)
Rectal cancer  CT abdomen/pelvis 08/21/2020-large rectal mass measuring 4.4 x 2.6 x 5.5 cm, haziness in the mesorectal fat, obscuration of the fat plane between the mass and the adjacent posterior aspect of the uterus, numerous prominent mesorectal lymph nodes measuring up to 5 mm Colonoscopy 08/23/2020-fungating infiltrative nonobstructing large mass found in the rectum.  Mass was noncircumferential, measured 5 cm in length, 3 cm in diameter and was 7 cm from the anal verge.  No bleeding was present.   Pathology showed invasive colonic adenocarcinoma, moderately differentiated.  No evidence of mismatch repair deficiency. Chest CT 09/01/2020-no evidence of metastatic disease MRI pelvis 09/02/2020-tumor at 10.4 cm from anal verge, 5.9 cm from the internal anal sphincter, T3, approximately 15 greater than 5 mm lymph nodes, N2b, multiple uterine fibroids Cycle 1 FOLFOX 09/15/2020 Rectal bleeding, change in bowel habits secondary to #1 Uterine fibroids noted on MRI pelvis 09/02/2020 Port-A-Cath placement 09/09/2020, Interventional Radiology    

## 2020-09-29 NOTE — Addendum Note (Signed)
Addended by: Lenox Ponds E on: 09/29/2020 10:35 AM   Modules accepted: Orders

## 2020-09-29 NOTE — Progress Notes (Signed)
  [facility] OFFICE PROGRESS NOTE   Diagnosis: Rectal cancer  INTERVAL HISTORY:   Sharon Bennett returns as scheduled.  He completed cycle 1 FOLFOX on 1122.  No nausea, mouth sores, diarrhea, cold sensitivity, or neuropathy symptoms.  She is having bowel movements.  She reports frequent belching.  She had an episode of abdominal pain on 09/25/2020.  She describes diffuse cramping pain.  No associated symptoms.  The pain resolved the next day.  She is working.  Objective:  Vital signs in last 24 hours:  Blood pressure 131/86, pulse 89, temperature 98.5 F (36.9 C), temperature source Oral, resp. rate 20, height _0  (1.676 m), weight 162 lb 3.2 oz (73.6 kg), last menstrual period 09/09/2020, SpO2 100 %.    HEENT: No thrush or ulcers Resp: Lungs clear bilaterally Cardio: Regular rate and rhythm GI: No hepatosplenomegaly, nontender, no mass Vascular: No leg edema  Skin: Palms without erythema  Portacath/PICC-without erythema  Lab Results:  Lab Results  Component Value Date   WBC 6.8 09/29/2020   HGB 11.6 (L) 09/29/2020   HCT 36.1 09/29/2020   MCV 85.5 09/29/2020   PLT 267 09/29/2020   NEUTROABS 2.3 09/29/2020    CMP  Lab Results  Component Value Date   NA 139 09/15/2020   K 3.8 09/15/2020   CL 106 09/15/2020   CO2 26 09/15/2020   GLUCOSE 86 09/15/2020   BUN 7 09/15/2020   CREATININE 0.58 09/15/2020   CALCIUM 8.3 (L) 09/15/2020   PROT 6.8 09/15/2020   ALBUMIN 3.7 09/15/2020   AST 10 (L) 09/15/2020   ALT 10 09/15/2020   ALKPHOS 52 09/15/2020   BILITOT 0.4 09/15/2020   GFRNONAA >60 09/15/2020    Lab Results  Component Value Date   CEA 1.48 09/15/2020    Medications: I have reviewed the patient's current medications.   Assessment/Plan: Rectal cancer  CT abdomen/pelvis 08/21/2020-large rectal mass measuring 4.4 x 2.6 x 5.5 cm, haziness in the mesorectal fat, obscuration of the fat plane between the mass and the adjacent posterior aspect of the  uterus, numerous prominent mesorectal lymph nodes measuring up to 5 mm Colonoscopy 08/23/2020-fungating infiltrative nonobstructing large mass found in the rectum.  Mass was noncircumferential, measured 5 cm in length, 3 cm in diameter and was 7 cm from the anal verge.  No bleeding was present.   Pathology showed invasive colonic adenocarcinoma, moderately differentiated.  No evidence of mismatch repair deficiency. Chest CT 09/01/2020-no evidence of metastatic disease MRI pelvis 09/02/2020-tumor at 10.4 cm from anal verge, 5.9 cm from the internal anal sphincter, T3, approximately 15 greater than 5 mm lymph nodes, N2b, multiple uterine fibroids Cycle 1 FOLFOX 09/15/2020 Cycle 2 FOLFOX 09/29/2020 Rectal bleeding, change in bowel habits secondary to #1 Uterine fibroids noted on MRI pelvis 09/02/2020 Port-A-Cath placement 09/09/2020, Interventional Radiology      Disposition: Sharon Bennett tolerated the first cycle of chemotherapy well.  She will complete cycle 2 FOLFOX today.  The etiology of the acute abdominal pain last week is unclear.  This may have been related to toxicity from chemotherapy.  She will begin a trial of Protonix for reflux symptoms.  We will prescribe tramadol to use as needed for pain.  She will return for an office visit and cycle 3 FOLFOX in 2 weeks.  [name], MD  09/29/2020  10:18 AM

## 2020-09-30 ENCOUNTER — Inpatient Hospital Stay: Payer: 59

## 2020-10-04 ENCOUNTER — Telehealth: Payer: Self-pay | Admitting: Genetic Counselor

## 2020-10-04 ENCOUNTER — Encounter: Payer: Self-pay | Admitting: *Deleted

## 2020-10-04 NOTE — Progress Notes (Signed)
Faxed completed FMLA form to [employer] att: Samella Parr at [phone]. Copy to HIM to scan and will provide patient copy at next appointment.

## 2020-10-04 NOTE — Telephone Encounter (Signed)
LVM that her genetic test results are available and requested that she call back to discuss them.  

## 2020-10-06 ENCOUNTER — Telehealth: Payer: Self-pay | Admitting: Licensed Clinical Social Worker

## 2020-10-06 ENCOUNTER — Ambulatory Visit: Payer: Self-pay | Admitting: Licensed Clinical Social Worker

## 2020-10-06 ENCOUNTER — Encounter: Payer: Self-pay | Admitting: Licensed Clinical Social Worker

## 2020-10-06 DIAGNOSIS — Z803 Family history of malignant neoplasm of breast: Secondary | ICD-10-CM

## 2020-10-06 DIAGNOSIS — Z808 Family history of malignant neoplasm of other organs or systems: Secondary | ICD-10-CM

## 2020-10-06 DIAGNOSIS — Z1379 Encounter for other screening for genetic and chromosomal anomalies: Secondary | ICD-10-CM

## 2020-10-06 DIAGNOSIS — Z800 Family history of malignant neoplasm of digestive organs: Secondary | ICD-10-CM

## 2020-10-06 DIAGNOSIS — C20 Malignant neoplasm of rectum: Secondary | ICD-10-CM

## 2020-10-06 NOTE — Progress Notes (Signed)
HPI:  Ms. Schiffer was previously seen in the [facility] due to a personal and family history of cancer and concerns regarding a hereditary predisposition to cancer. Please refer to our prior cancer genetics clinic note for more information regarding our discussion, assessment and recommendations, at the time. Ms. Gomer recent genetic test results were disclosed to her, as were recommendations warranted by these results. These results and recommendations are discussed in more detail below.  CANCER HISTORY:  Oncology History  Rectal cancer ([place])  09/05/2020 Initial Diagnosis   Rectal cancer ([place])   09/05/2020 Cancer Staging   Staging form: Colon and Rectum, AJCC 8th Edition - Clinical: Stage IIIC (cT3, cN2b, cM0) - Signed by Ladell Pier, MD on 09/05/2020   09/15/2020 -  Chemotherapy    Patient is on Treatment Plan: COLORECTAL FOLFOX Q14D X 4 MONTHS        Genetic Testing   Negative genetic testing. No pathogenic variants identified on the [facility] Multi-Cancer Panel. VUS in MUTYH called c.1532C>T (p.Ser511Phe) identified. The report date is 09/24/2020.   The Multi-Cancer Panel offered by [facility] includes sequencing and/or deletion duplication testing of the following 84 genes: AIP, ALK, APC, ATM, AXIN2,BAP1,  BARD1, BLM, BMPR1A, BRCA1, BRCA2, BRIP1, CASR, CDC73, CDH1, CDK4, CDKN1B, CDKN1C, CDKN2A (p14ARF), CDKN2A (p16INK4a), CEBPA, CHEK2, CTNNA1, DICER1, DIS3L2, EGFR (c.2369C>T, p.Thr790Met variant only), EPCAM (Deletion/duplication testing only), FH, FLCN, GATA2, GPC3, GREM1 (Promoter region deletion/duplication testing only), HOXB13 (c.251G>A, p.Gly84Glu), HRAS, KIT, MAX, MEN1, MET, MITF (c.952G>A, p.Glu318Lys variant only), MLH1, MSH2, MSH3, MSH6, MUTYH, NBN, NF1, NF2, NTHL1, PALB2, PDGFRA, PHOX2B, PMS2, POLD1, POLE, POT1, PRKAR1A, PTCH1, PTEN, RAD50, RAD51C, RAD51D, RB1, RECQL4, RET, RUNX1, SDHAF2, SDHA (sequence changes only), SDHB, SDHC, SDHD, SMAD4, SMARCA4, SMARCB1,  SMARCE1, STK11, SUFU, TERC, TERT, TMEM127, TP53, TSC1, TSC2, VHL, WRN and WT1.     FAMILY HISTORY:  We obtained a detailed, 4-generation family history.  Significant diagnoses are listed below: Family History  Problem Relation Age of Onset   Hypertension Mother    Heart failure Father    Hypertension Father    Pancreatic cancer Maternal Grandfather        dx 52s   Brain cancer Paternal Grandmother        dx >50   Breast cancer Other 16       MGF's sister   Ms. Georgi has one daughter (age 87) and one son (age 36). She has one brother (age 4) and one sister (age 41). None of these relatives have had cancer. Neither of her siblings have had a colonoscopy, although her brother has one scheduled for this year.2   Ms. Lenn's mother is alive at age 37 without cancer. There is one maternal aunt and three maternal uncles. There is no known cancer among maternal aunts/uncles or maternal cousins. Ms. Dhingra does not have any information about her maternal grandmother. Her maternal grandfather died in his 84s with pancreatic cancer. A maternal great-aunt (MGF's sister) had breast cancer around age 75.    Ms. Ousley's father died at age 83 without cancer. There were six paternal aunts and four paternal uncles. There is no known cancer among paternal aunts/uncles or paternal cousins. Ms. Ueda paternal grandmother died older than 66 with brain cancer. Her paternal grandfather died older than 65 without cancer.   Ms. Vannatter is unaware of previous family history of genetic testing for hereditary cancer risks. Patient's ancestors are of unknown descent. There is no reported Ashkenazi Jewish ancestry. There is no known  consanguinity.  GENETIC TEST RESULTS: Genetic testing reported out on 09/24/2020 through the [facility] panel found no pathogenic mutations.   The Multi-Cancer Panel offered by [facility] includes sequencing and/or deletion duplication testing of the following 84 genes: AIP, ALK, APC,  ATM, AXIN2,BAP1,  BARD1, BLM, BMPR1A, BRCA1, BRCA2, BRIP1, CASR, CDC73, CDH1, CDK4, CDKN1B, CDKN1C, CDKN2A (p14ARF), CDKN2A (p16INK4a), CEBPA, CHEK2, CTNNA1, DICER1, DIS3L2, EGFR (c.2369C>T, p.Thr790Met variant only), EPCAM (Deletion/duplication testing only), FH, FLCN, GATA2, GPC3, GREM1 (Promoter region deletion/duplication testing only), HOXB13 (c.251G>A, p.Gly84Glu), HRAS, KIT, MAX, MEN1, MET, MITF (c.952G>A, p.Glu318Lys variant only), MLH1, MSH2, MSH3, MSH6, MUTYH, NBN, NF1, NF2, NTHL1, PALB2, PDGFRA, PHOX2B, PMS2, POLD1, POLE, POT1, PRKAR1A, PTCH1, PTEN, RAD50, RAD51C, RAD51D, RB1, RECQL4, RET, RUNX1, SDHAF2, SDHA (sequence changes only), SDHB, SDHC, SDHD, SMAD4, SMARCA4, SMARCB1, SMARCE1, STK11, SUFU, TERC, TERT, TMEM127, TP53, TSC1, TSC2, VHL, WRN and WT1.   The test report has been scanned into EPIC and is located under the Molecular Pathology section of the Results Review tab.  A portion of the result report is included below for reference.     We discussed that because current genetic testing is not perfect, it is possible there may be a gene mutation in one of these genes that current testing cannot detect, but that chance is small.  There could be another gene that has not yet been discovered, or that we have not yet tested, that is responsible for the cancer diagnoses in the family. It is also possible there is a hereditary cause for the cancer in the family that Ms. Elford did not inherit and therefore was not identified in her testing.  Therefore, it is important to remain in touch with cancer genetics in the future so that we can continue to offer Ms. Choo the most up to date genetic testing.   Genetic testing did identify a variant of uncertain significance (VUS) in the MUTYH gene. At this time, it is unknown if this variant is associated with increased cancer risk or if this is a normal finding, but most variants such as this get reclassified to being inconsequential. It should not be used  to make medical management decisions. With time, we suspect the lab will determine the significance of this variant, if any. If we do learn more about it we will try to contact Ms. Wernli to discuss it further. However, it is important to stay in touch with [place] periodically and keep the address and phone number up to date.  ADDITIONAL GENETIC TESTING: We discussed with Ms. Tischer that her genetic testing was fairly extensive.  If there are genes identified to increase cancer risk that can be analyzed in the future, we would be happy to discuss and coordinate this testing at that time.    CANCER SCREENING RECOMMENDATIONS: Ms. Tarr test result is considered negative (normal).  This means that we have not identified a hereditary cause for her personal and family history of cancer at this time. Most cancers happen by chance and this negative test suggests that her cancer may fall into this category.    While reassuring, this does not definitively rule out a hereditary predisposition to cancer. It is still possible that there could be genetic mutations that are undetectable by current technology. There could be genetic mutations in genes that have not been tested or identified to increase cancer risk.  Therefore, it is recommended she continue to follow the cancer management and screening guidelines provided by her oncology and primary healthcare provider.  An individual's cancer risk and medical management are not determined by genetic test results alone. Overall cancer risk assessment incorporates additional factors, including personal medical history, family history, and any available genetic information that may result in a personalized plan for cancer prevention and surveillance.  RECOMMENDATIONS FOR FAMILY MEMBERS:  Relatives in this family might be at some increased risk of developing cancer, over the general population risk, simply due to the family history of cancer.  We recommended female relatives  in this family have a yearly mammogram beginning at age 62, or 44 years younger than the earliest onset of cancer, an annual clinical breast exam, and perform monthly breast self-exams. Female relatives in this family should also have a gynecological exam as recommended by their primary provider.  All family members should be referred for colonoscopy starting at age 46.   It is also possible there is a hereditary cause for the cancer in Ms. Talbot's family that she did not inherit and therefore was not identified in her.  Based on Ms. Cassedy's family history, we recommended her maternal relatives have genetic counseling and testing. Ms. Keehan will let us know if we can be of any assistance in coordinating genetic counseling and/or testing for these family members.  FOLLOW-UP: Lastly, we discussed with Ms. Sidle that cancer genetics is a rapidly advancing field and it is possible that new genetic tests will be appropriate for her and/or her family members in the future. We encouraged her to remain in contact with cancer genetics on an annual basis so we can update her personal and family histories and let her know of advances in cancer genetics that may benefit this family.   Our contact number was provided. Ms. Yankey questions were answered to her satisfaction, and she knows she is welcome to call us at anytime with additional questions or concerns.   Faith Rogue, MS, [facility] Genetic Counselor [email or web address] Phone: [phone]

## 2020-10-06 NOTE — Telephone Encounter (Signed)
Revealed negative genetic testing.  Revealed that a VUS in MUTYH was identified. This normal result is reassuring and indicates that it is unlikely Sharon Bennett's cancer is due to a hereditary cause.  It is unlikely that there is an increased risk of another cancer due to a mutation in one of these genes.  However, genetic testing is not perfect, and cannot definitively rule out a hereditary cause.  It will be important for her to keep in contact with genetics to learn if any additional testing may be needed in the future.

## 2020-10-10 ENCOUNTER — Other Ambulatory Visit: Payer: Self-pay | Admitting: Oncology

## 2020-10-13 ENCOUNTER — Inpatient Hospital Stay: Payer: 59

## 2020-10-13 ENCOUNTER — Inpatient Hospital Stay: Payer: 59 | Attending: Oncology

## 2020-10-13 ENCOUNTER — Other Ambulatory Visit: Payer: Self-pay

## 2020-10-13 ENCOUNTER — Inpatient Hospital Stay: Payer: 59 | Admitting: Oncology

## 2020-10-13 VITALS — BP 125/98 | HR 84 | Temp 98.2°F | Resp 18 | Ht 66.0 in | Wt 160.8 lb

## 2020-10-13 DIAGNOSIS — C20 Malignant neoplasm of rectum: Secondary | ICD-10-CM | POA: Diagnosis not present

## 2020-10-13 DIAGNOSIS — Z5111 Encounter for antineoplastic chemotherapy: Secondary | ICD-10-CM | POA: Insufficient documentation

## 2020-10-13 LAB — CBC WITH DIFFERENTIAL (CANCER CENTER ONLY)
Abs Immature Granulocytes: 0.01 10*3/uL (ref 0.00–0.07)
Basophils Absolute: 0 10*3/uL (ref 0.0–0.1)
Basophils Relative: 0 %
Eosinophils Absolute: 0.1 10*3/uL (ref 0.0–0.5)
Eosinophils Relative: 1 %
HCT: 36.9 % (ref 36.0–46.0)
Hemoglobin: 11.9 g/dL — ABNORMAL LOW (ref 12.0–15.0)
Immature Granulocytes: 0 %
Lymphocytes Relative: 50 %
Lymphs Abs: 3.5 10*3/uL (ref 0.7–4.0)
MCH: 27.1 pg (ref 26.0–34.0)
MCHC: 32.2 g/dL (ref 30.0–36.0)
MCV: 84.1 fL (ref 80.0–100.0)
Monocytes Absolute: 1.2 10*3/uL — ABNORMAL HIGH (ref 0.1–1.0)
Monocytes Relative: 17 %
Neutro Abs: 2.3 10*3/uL (ref 1.7–7.7)
Neutrophils Relative %: 32 %
Platelet Count: 221 10*3/uL (ref 150–400)
RBC: 4.39 MIL/uL (ref 3.87–5.11)
RDW: 13.5 % (ref 11.5–15.5)
WBC Count: 7 10*3/uL (ref 4.0–10.5)
nRBC: 0 % (ref 0.0–0.2)

## 2020-10-13 LAB — CMP (CANCER CENTER ONLY)
ALT: 21 U/L (ref 0–44)
AST: 14 U/L — ABNORMAL LOW (ref 15–41)
Albumin: 4.2 g/dL (ref 3.5–5.0)
Alkaline Phosphatase: 61 U/L (ref 38–126)
Anion gap: 8 (ref 5–15)
BUN: 8 mg/dL (ref 6–20)
CO2: 26 mmol/L (ref 22–32)
Calcium: 8.9 mg/dL (ref 8.9–10.3)
Chloride: 105 mmol/L (ref 98–111)
Creatinine: 0.6 mg/dL (ref 0.44–1.00)
GFR, Estimated: >60 mL/min (ref >60)
Glucose, Bld: 79 mg/dL (ref 70–99)
Potassium: 3.7 mmol/L (ref 3.5–5.1)
Sodium: 139 mmol/L (ref 135–145)
Total Bilirubin: 0.3 mg/dL (ref 0.3–1.2)
Total Protein: 7.2 g/dL (ref 6.5–8.1)

## 2020-10-13 LAB — PREGNANCY, URINE: Preg Test, Ur: NEGATIVE

## 2020-10-13 MED ORDER — SODIUM CHLORIDE 0.9 % IV SOLN
10.0000 mg | Freq: Once | INTRAVENOUS | Status: AC
  Administered 2020-10-13: 10 mg via INTRAVENOUS
  Filled 2020-10-13: qty 1

## 2020-10-13 MED ORDER — SODIUM CHLORIDE 0.9% FLUSH: 10.0000 mL | INTRAVENOUS | Status: DC | PRN

## 2020-10-13 MED ORDER — FLUOROURACIL CHEMO INJECTION 2.5 GM/50ML
400.0000 mg/m2 | Freq: Once | INTRAVENOUS | Status: AC
  Administered 2020-10-13: 750 mg via INTRAVENOUS
  Filled 2020-10-13: qty 15

## 2020-10-13 MED ORDER — LEUCOVORIN CALCIUM INJECTION 350 MG
400.0000 mg/m2 | Freq: Once | INTRAVENOUS | Status: AC
  Administered 2020-10-13: 744 mg via INTRAVENOUS
  Filled 2020-10-13: qty 37.2

## 2020-10-13 MED ORDER — PALONOSETRON HCL INJECTION 0.25 MG/5ML
0.2500 mg | Freq: Once | INTRAVENOUS | Status: AC
  Administered 2020-10-13: 0.25 mg via INTRAVENOUS
  Filled 2020-10-13: qty 5

## 2020-10-13 MED ORDER — DEXTROSE 5 % IV SOLN: Freq: Once | INTRAVENOUS | Status: AC

## 2020-10-13 MED ORDER — OXALIPLATIN CHEMO INJECTION 100 MG/20ML
85.0000 mg/m2 | Freq: Once | INTRAVENOUS | Status: AC
  Administered 2020-10-13: 160 mg via INTRAVENOUS
  Filled 2020-10-13: qty 32

## 2020-10-13 MED ORDER — SODIUM CHLORIDE 0.9 % IV SOLN
2400.0000 mg/m2 | INTRAVENOUS | Status: DC
  Administered 2020-10-13: 4450 mg via INTRAVENOUS
  Filled 2020-10-13: qty 89

## 2020-10-13 NOTE — Progress Notes (Signed)
Patient presents for treatment. RN assessment completed along with the following:  Labs reviewed - WNL Vitals reviewed including weight - WNL Oncology Treatment Attestation completed - yes Consent completed and reflects current therapy/intent - yes MD/APP progress note reviewed - Note still in progress at this time. Treatment plan/orders reviewed - yes Last treatment date reviewed and appropriate      amount of time has elapsed between treatments.   Patient to proceed with treatment.

## 2020-10-13 NOTE — Progress Notes (Signed)
  [facility] OFFICE PROGRESS NOTE   Diagnosis: Rectal cancer  INTERVAL HISTORY:   Sharon Bennett completed cycle 2 FOLFOX on 09/29/2020.  She had increased cold sensitivity following this cycle of chemotherapy.  No nausea/vomiting, mouth sores, or diarrhea.  No neuropathy symptoms at present.  She has noted lymph nodes in the groin and neck.  Decreased rectal bleeding.  Objective:  Vital signs in last 24 hours:  Blood pressure (!) 125/98, pulse 84, temperature 98.2 F (36.8 C), temperature source Oral, resp. rate 18, height _0  (1.676 m), weight 160 lb 12.8 oz (72.9 kg), SpO2 100 %.    HEENT: No thrush or ulcers Lymphatics: Soft mobile bilateral cervical/scalene nodes.  "Shotty "bilateral inguinal nodes Resp: Lungs clear bilaterally Cardio: Regular rate and rhythm GI: No hepatosplenomegaly Vascular: No leg edema Skin: Dryness of the palms, no erythema  Portacath/PICC-without erythema  Lab Results:  Lab Results  Component Value Date   WBC 7.0 10/13/2020   HGB 11.9 (L) 10/13/2020   HCT 36.9 10/13/2020   MCV 84.1 10/13/2020   PLT 221 10/13/2020   NEUTROABS 2.3 10/13/2020    CMP  Lab Results  Component Value Date   NA 139 10/13/2020   K 3.7 10/13/2020   CL 105 10/13/2020   CO2 26 10/13/2020   GLUCOSE 79 10/13/2020   BUN 8 10/13/2020   CREATININE 0.60 10/13/2020   CALCIUM 8.9 10/13/2020   PROT 7.2 10/13/2020   ALBUMIN 4.2 10/13/2020   AST 14 (L) 10/13/2020   ALT 21 10/13/2020   ALKPHOS 61 10/13/2020   BILITOT 0.3 10/13/2020   GFRNONAA >60 10/13/2020    Lab Results  Component Value Date   CEA 1.48 09/15/2020    Medications: I have reviewed the patient's current medications.   Assessment/Plan: Rectal cancer  CT abdomen/pelvis 08/21/2020-large rectal mass measuring 4.4 x 2.6 x 5.5 cm, haziness in the mesorectal fat, obscuration of the fat plane between the mass and the adjacent posterior aspect of the uterus, numerous prominent mesorectal lymph  nodes measuring up to 5 mm Colonoscopy 08/23/2020-fungating infiltrative nonobstructing large mass found in the rectum.  Mass was noncircumferential, measured 5 cm in length, 3 cm in diameter and was 7 cm from the anal verge.  No bleeding was present.   Pathology showed invasive colonic adenocarcinoma, moderately differentiated.  No evidence of mismatch repair deficiency. Chest CT 09/01/2020-no evidence of metastatic disease MRI pelvis 09/02/2020-tumor at 10.4 cm from anal verge, 5.9 cm from the internal anal sphincter, T3, approximately 15 greater than 5 mm lymph nodes, N2b, multiple uterine fibroids Cycle 1 FOLFOX 09/15/2020 Cycle 2 FOLFOX 09/29/2020 Cycle 3 FOLFOX 10/13/2020 Rectal bleeding, change in bowel habits secondary to #1 Uterine fibroids noted on MRI pelvis 09/02/2020 Port-A-Cath placement 09/09/2020, Interventional Radiology       Disposition: Sharon Bennett appears stable.  She has completed 2 cycles of FOLFOX.  She has tolerated the chemotherapy well.  She will complete cycle 3 today.  The small neck and groin nodes are very likely benign.  She will return for an office visit and chemotherapy in 2 weeks.  [name], MD  10/13/2020  10:58 AM

## 2020-10-13 NOTE — Patient Instructions (Signed)
Sharon Bennett  Discharge Instructions: Thank you for choosing [facility] to provide your oncology and hematology care.   If you have a lab appointment with the [facility], please go directly to the [facility] and check in at the registration area.   Wear comfortable clothing and clothing appropriate for easy access to any Portacath or PICC line.   We strive to give you quality time with your provider. You may need to reschedule your appointment if you arrive late (15 or more minutes).  Arriving late affects you and other patients whose appointments are after yours.  Also, if you miss three or more appointments without notifying the office, you may be dismissed from the clinic at the provider's discretion.      For prescription refill requests, have your pharmacy contact our office and allow 72 hours for refills to be completed.    Today you received the following chemotherapy and/or immunotherapy agents oxaliplatin, leucovorin, 5 fu       To help prevent nausea and vomiting after your treatment, we encourage you to take your nausea medication as directed.  BELOW ARE SYMPTOMS THAT SHOULD BE REPORTED IMMEDIATELY: *FEVER GREATER THAN 100.4 F (38 C) OR HIGHER *CHILLS OR SWEATING *NAUSEA AND VOMITING THAT IS NOT CONTROLLED WITH YOUR NAUSEA MEDICATION *UNUSUAL SHORTNESS OF BREATH *UNUSUAL BRUISING OR BLEEDING *URINARY PROBLEMS (pain or burning when urinating, or frequent urination) *BOWEL PROBLEMS (unusual diarrhea, constipation, pain near the anus) TENDERNESS IN MOUTH AND THROAT WITH OR WITHOUT PRESENCE OF ULCERS (sore throat, sores in mouth, or a toothache) UNUSUAL RASH, SWELLING OR PAIN  UNUSUAL VAGINAL DISCHARGE OR ITCHING   Items with * indicate a potential emergency and should be followed up as soon as possible or go to the Emergency Department if any problems should occur.  Please show the CHEMOTHERAPY ALERT CARD or IMMUNOTHERAPY ALERT  CARD at check-in to the Emergency Department and triage nurse.  Should you have questions after your visit or need to cancel or reschedule your appointment, please contact [facility]  Dept: [phone]  and follow the prompts.  Office hours are 8:00 a.m. to 4:30 p.m. Monday - Friday. Please note that voicemails left after 4:00 p.m. may not be returned until the following business day.  We are closed weekends and major holidays. You have access to a nurse at all times for urgent questions. Please call the main number to the clinic Dept: [phone] and follow the prompts.   For any non-urgent questions, you may also contact your provider using MyChart. We now offer e-Visits for anyone 5 and older to request care online for non-urgent symptoms. For details visit [email or web address].   Also download the MyChart app! Go to the app store, search "MyChart", open the app, select [facility], and log in with your MyChart username and password.  Due to Covid, a mask is required upon entering the hospital/clinic. If you do not have a mask, one will be given to you upon arrival. For doctor visits, patients may have 1 support person aged 13 or older with them. For treatment visits, patients cannot have anyone with them due to current Covid guidelines and our immunocompromised population.   Oxaliplatin Injection What is this medication? OXALIPLATIN (ox AL i PLA tin) is a chemotherapy drug. It targets fast dividing cells, like cancer cells, and causes these cells to die. This medicine is usedto treat cancers of the colon and rectum, and many other cancers.  This medicine may be used for other purposes; ask your health care provider orpharmacist if you have questions. COMMON BRAND NAME(S): Eloxatin What should I tell my care team before I take this medication? They need to know if you have any of these conditions: heart disease history of irregular heartbeat liver disease low  blood counts, like white cells, platelets, or red blood cells lung or breathing disease, like asthma take medicines that treat or prevent blood clots tingling of the fingers or toes, or other nerve disorder an unusual or allergic reaction to oxaliplatin, other chemotherapy, other medicines, foods, dyes, or preservatives pregnant or trying to get pregnant breast-feeding How should I use this medication? This drug is given as an infusion into a vein. It is administered in a hospitalor clinic by a specially trained health care professional. Talk to your pediatrician regarding the use of this medicine in children.Special care may be needed. Overdosage: If you think you have taken too much of this medicine contact apoison control center or emergency room at once. NOTE: This medicine is only for you. Do not share this medicine with others. What if I miss a dose? It is important not to miss a dose. Call your doctor or health careprofessional if you are unable to keep an appointment. What may interact with this medication? Do not take this medicine with any of the following medications: cisapride dronedarone pimozide thioridazine This medicine may also interact with the following medications: aspirin and aspirin-like medicines certain medicines that treat or prevent blood clots like warfarin, apixaban, dabigatran, and rivaroxaban cisplatin cyclosporine diuretics medicines for infection like acyclovir, adefovir, amphotericin B, bacitracin, cidofovir, foscarnet, ganciclovir, gentamicin, pentamidine, vancomycin NSAIDs, medicines for pain and inflammation, like ibuprofen or naproxen other medicines that prolong the QT interval (an abnormal heart rhythm) pamidronate zoledronic acid This list may not describe all possible interactions. Give your health care provider a list of all the medicines, herbs, non-prescription drugs, or dietary supplements you use. Also tell them if you smoke, drink alcohol,  or use illegaldrugs. Some items may interact with your medicine. What should I watch for while using this medication? Your condition will be monitored carefully while you are receiving thismedicine. You may need blood work done while you are taking this medicine. This medicine may make you feel generally unwell. This is not uncommon as chemotherapy can affect healthy cells as well as cancer cells. Report any side effects. Continue your course of treatment even though you feel ill unless yourhealthcare professional tells you to stop. This medicine can make you more sensitive to cold. Do not drink cold drinks or use ice. Cover exposed skin before coming in contact with cold temperatures or cold objects. When out in cold weather wear warm clothing and cover your mouth and nose to warm the air that goes into your lungs. Tell your doctor if you getsensitive to the cold. Do not become pregnant while taking this medicine or for 9 months after stopping it. Women should inform their health care professional if they wish to become pregnant or think they might be pregnant. Men should not father a child while taking this medicine and for 6 months after stopping it. There is potential for serious side effects to an unborn child. Talk to your health careprofessional for more information. Do not breast-feed a child while taking this medicine or for 3 months afterstopping it. This medicine has caused ovarian failure in some women. This medicine may make it more difficult to get pregnant. Talk to  your health care professional if youare concerned about your fertility. This medicine has caused decreased sperm counts in some men. This may make it more difficult to father a child. Talk to your health care professional if [place] S[place] concerned about your fertility. This medicine may increase your risk of getting an infection. Call your health care professional for advice if you get a fever, chills, or sore throat, or other symptoms  of a cold or flu. Do not treat yourself. Try to avoid beingaround people who are sick. Avoid taking medicines that contain aspirin, acetaminophen, ibuprofen, naproxen, or ketoprofen unless instructed by your health care professional.These medicines may hide a fever. Be careful brushing or flossing your teeth or using a toothpick because you may get an infection or bleed more easily. If you have any dental work done, Primary school teacher you are receiving this medicine. What side effects may I notice from receiving this medication? Side effects that you should report to your doctor or health care professionalas soon as possible: allergic reactions like skin rash, itching or hives, swelling of the face, lips, or tongue breathing problems cough low blood counts - this medicine may decrease the number of white blood cells, red blood cells, and platelets. You may be at increased risk for infections and bleeding nausea, vomiting pain, redness, or irritation at site where injected pain, tingling, numbness in the hands or feet signs and symptoms of bleeding such as bloody or black, tarry stools; red or dark brown urine; spitting up blood or brown material that looks like coffee grounds; red spots on the skin; unusual bruising or bleeding from the eyes, gums, or nose signs and symptoms of a dangerous change in heartbeat or heart rhythm like chest pain; dizziness; fast, irregular heartbeat; palpitations; feeling faint or lightheaded; falls signs and symptoms of infection like fever; chills; cough; sore throat; pain or trouble passing urine signs and symptoms of liver injury like dark yellow or brown urine; general ill feeling or flu-like symptoms; light-colored stools; loss of appetite; nausea; right upper belly pain; unusually weak or tired; yellowing of the eyes or skin signs and symptoms of low red blood cells or anemia such as unusually weak or tired; feeling faint or lightheaded; falls signs and symptoms of  muscle injury like dark urine; trouble passing urine or change in the amount of urine; unusually weak or tired; muscle pain; back pain Side effects that usually do not require medical attention (report to yourdoctor or health care professional if they continue or are bothersome): changes in taste diarrhea gas hair loss loss of appetite mouth sores This list may not describe all possible side effects. Call your doctor for medical advice about side effects. You may report side effects to FDA at1-800-FDA-1088. Where should I keep my medication? This drug is given in a hospital or clinic and will not be stored at home. NOTE: This sheet is a summary. It may not cover all possible information. If you have questions about this medicine, talk to your doctor, pharmacist, orhealth care provider.  2022 Elsevier/Gold Standard (2018-06-11 12:20:35)  Leucovorin injection What is this medication? LEUCOVORIN (loo koe VOR in) is used to prevent or treat the harmful effects of some medicines. This medicine is used to treat anemia caused by a low amount of folic acid in the body. It is also used with 5-fluorouracil (5-FU) to treatcolon cancer. This medicine may be used for other purposes; ask your health care provider orpharmacist if you have questions. What should I tell my  care team before I take this medication? They need to know if you have any of these conditions: anemia from low levels of vitamin B-12 in the blood an unusual or allergic reaction to leucovorin, folic acid, other medicines, foods, dyes, or preservatives pregnant or trying to get pregnant breast-feeding How should I use this medication? This medicine is for injection into a muscle or into a vein. It is given by ahealth care professional in a hospital or clinic setting. Talk to your pediatrician regarding the use of this medicine in children.Special care may be needed. Overdosage: If you think you have taken too much of this medicine  contact apoison control center or emergency room at once. NOTE: This medicine is only for you. Do not share this medicine with others. What if I miss a dose? This does not apply. What may interact with this medication? capecitabine fluorouracil phenobarbital phenytoin primidone trimethoprim-sulfamethoxazole This list may not describe all possible interactions. Give your health care provider a list of all the medicines, herbs, non-prescription drugs, or dietary supplements you use. Also tell them if you smoke, drink alcohol, or use illegaldrugs. Some items may interact with your medicine. What should I watch for while using this medication? Your condition will be monitored carefully while you are receiving thismedicine. This medicine may increase the side effects of 5-fluorouracil, 5-FU. Tell your doctor or health care professional if you have diarrhea or mouth sores that donot get better or that get worse. What side effects may I notice from receiving this medication? Side effects that you should report to your doctor or health care professionalas soon as possible: allergic reactions like skin rash, itching or hives, swelling of the face, lips, or tongue breathing problems fever, infection mouth sores unusual bleeding or bruising unusually weak or tired Side effects that usually do not require medical attention (report to yourdoctor or health care professional if they continue or are bothersome): constipation or diarrhea loss of appetite nausea, vomiting This list may not describe all possible side effects. Call your doctor for medical advice about side effects. You may report side effects to FDA at1-800-FDA-1088. Where should I keep my medication? This drug is given in a hospital or clinic and will not be stored at home. NOTE: This sheet is a summary. It may not cover all possible information. If you have questions about this medicine, talk to your doctor, pharmacist, orhealth care  provider.  2022 Elsevier/Gold Standard (2007-07-29 16:50:29)   Fluorouracil, 5-FU injection What is this medication? FLUOROURACIL, 5-FU (flure oh YOOR a sil) is a chemotherapy drug. It slows the growth of cancer cells. This medicine is used to treat many types of cancer like breast cancer, colon or rectal cancer, pancreatic cancer, and stomachcancer. This medicine may be used for other purposes; ask your health care provider orpharmacist if you have questions. COMMON BRAND NAME(S): Adrucil What should I tell my care team before I take this medication? They need to know if you have any of these conditions: blood disorders dihydropyrimidine dehydrogenase (DPD) deficiency infection (especially a virus infection such as chickenpox, cold sores, or herpes) kidney disease liver disease malnourished, poor nutrition recent or ongoing radiation therapy an unusual or allergic reaction to fluorouracil, other chemotherapy, other medicines, foods, dyes, or preservatives pregnant or trying to get pregnant breast-feeding How should I use this medication? This drug is given as an infusion or injection into a vein. It is administeredin a hospital or clinic by a specially trained health care professional. Talk to your  pediatrician regarding the use of this medicine in children.Special care may be needed. Overdosage: If you think you have taken too much of this medicine contact apoison control center or emergency room at once. NOTE: This medicine is only for you. Do not share this medicine with others. What if I miss a dose? It is important not to miss your dose. Call your doctor or health careprofessional if you are unable to keep an appointment. What may interact with this medication? Do not take this medicine with any of the following medications: live virus vaccines This medicine may also interact with the following medications: medicines that treat or prevent blood clots like warfarin, enoxaparin,  and dalteparin This list may not describe all possible interactions. Give your health care provider a list of all the medicines, herbs, non-prescription drugs, or dietary supplements you use. Also tell them if you smoke, drink alcohol, or use illegaldrugs. Some items may interact with your medicine. What should I watch for while using this medication? Visit your doctor for checks on your progress. This drug may make you feel generally unwell. This is not uncommon, as chemotherapy can affect healthy cells as well as cancer cells. Report any side effects. Continue your course oftreatment even though you feel ill unless your doctor tells you to stop. In some cases, you may be given additional medicines to help with side effects.Follow all directions for their use. Call your doctor or health care professional for advice if you get a fever, chills or sore throat, or other symptoms of a cold or flu. Do not treat yourself. This drug decreases your body's ability to fight infections. Try toavoid being around people who are sick. This medicine may increase your risk to bruise or bleed. Call your doctor orhealth care professional if you notice any unusual bleeding. Be careful brushing and flossing your teeth or using a toothpick because you may get an infection or bleed more easily. If you have any dental work done,tell your dentist you are receiving this medicine. Avoid taking products that contain aspirin, acetaminophen, ibuprofen, naproxen, or ketoprofen unless instructed by your doctor. These medicines may hide afever. Do not become pregnant while taking this medicine. Women should inform their doctor if they wish to become pregnant or think they might be pregnant. There is a potential for serious side effects to an unborn child. Talk to your health care professional or pharmacist for more information. Do not breast-feed aninfant while taking this medicine. Men should inform their doctor if they wish to father a  child. This medicinemay lower sperm counts. Do not treat diarrhea with over the counter products. Contact your doctor ifyou have diarrhea that lasts more than 2 days or if it is severe and watery. This medicine can make you more sensitive to the sun. Keep out of the sun. If you cannot avoid being in the sun, wear protective clothing and use sunscreen.Do not use sun lamps or tanning beds/booths. What side effects may I notice from receiving this medication? Side effects that you should report to your doctor or health care professionalas soon as possible: allergic reactions like skin rash, itching or hives, swelling of the face, lips, or tongue low blood counts - this medicine may decrease the number of white blood cells, red blood cells and platelets. You may be at increased risk for infections and bleeding. signs of infection - fever or chills, cough, sore throat, pain or difficulty passing urine signs of decreased platelets or bleeding - bruising, pinpoint red  spots on the skin, black, tarry stools, blood in the urine signs of decreased red blood cells - unusually weak or tired, fainting spells, lightheadedness breathing problems changes in vision chest pain mouth sores nausea and vomiting pain, swelling, redness at site where injected pain, tingling, numbness in the hands or feet redness, swelling, or sores on hands or feet stomach pain unusual bleeding Side effects that usually do not require medical attention (report to yourdoctor or health care professional if they continue or are bothersome): changes in finger or toe nails diarrhea dry or itchy skin hair loss headache loss of appetite sensitivity of eyes to the light stomach upset unusually teary eyes This list may not describe all possible side effects. Call your doctor for medical advice about side effects. You may report side effects to FDA at1-800-FDA-1088. Where should I keep my medication? This drug is given in a hospital  or clinic and will not be stored at home. NOTE: This sheet is a summary. It may not cover all possible information. If you have questions about this medicine, talk to your doctor, pharmacist, orhealth care provider.  2022 Elsevier/Gold Standard (2018-12-23 15:00:03)

## 2020-10-13 NOTE — Patient Instructions (Signed)
Implanted Port Home Guide An implanted port is a device that is placed under the skin. It is usually placed in the chest. The device can be used to give IV medicine, to take blood, or for dialysis. You may have an implanted port if: You need IV medicine that would be irritating to the small veins in your hands or arms. You need IV medicines, such as antibiotics, for a long period of time. You need IV nutrition for a long period of time. You need dialysis. When you have a port, your health care provider can choose to use the port instead of veins in your arms for these procedures. You may have fewer limitations when using a port than you would if you used other types of long-term IVs, and you will likely be able to return to normal activities after your incision heals. An implanted port has two main parts: Reservoir. The reservoir is the part where a needle is inserted to give medicines or draw blood. The reservoir is round. After it is placed, it appears as a small, raised area under your skin. Catheter. The catheter is a thin, flexible tube that connects the reservoir to a vein. Medicine that is inserted into the reservoir goes into the catheter and then into the vein. How is my port accessed? To access your port: A numbing cream may be placed on the skin over the port site. Your health care provider will put on a mask and sterile gloves. The skin over your port will be cleaned carefully with a germ-killing soap and allowed to dry. Your health care provider will gently pinch the port and insert a needle into it. Your health care provider will check for a blood return to make sure the port is in the vein and is not clogged. If your port needs to remain accessed to get medicine continuously (constant infusion), your health care provider will place a clear bandage (dressing) over the needle site. The dressing and needle will need to be changed every week, or as told by your health care provider. What  is flushing? Flushing helps keep the port from getting clogged. Follow instructions from your health care provider about how and when to flush the port. Ports are usually flushed with saline solution or a medicine called heparin. The need for flushing will depend on how the port is used: If the port is only used from time to time to give medicines or draw blood, the port may need to be flushed: Before and after medicines have been given. Before and after blood has been drawn. As part of routine maintenance. Flushing may be recommended every 4-6 weeks. If a constant infusion is running, the port may not need to be flushed. Throw away any syringes in a disposal container that is meant for sharp items (sharps container). You can buy a sharps container from a pharmacy, or you can make one by using an empty hard plastic bottle with a cover. How long will my port stay implanted? The port can stay in for as long as your health care provider thinks it is needed. When it is time for the port to come out, a surgery will be done to remove it. The surgery will be similar to the procedure that was done to put the port in. Follow these instructions at home:  Flush your port as told by your health care provider. If you need an infusion over several days, follow instructions from your health care provider about how   to take care of your port site. Make sure you: Wash your hands with soap and water before you change your dressing. If soap and water are not available, use alcohol-based hand sanitizer. Change your dressing as told by your health care provider. Place any used dressings or infusion bags into a plastic bag. Throw that bag in the trash. Keep the dressing that covers the needle clean and dry. Do not get it wet. Do not use scissors or sharp objects near the tube. Keep the tube clamped, unless it is being used. Check your port site every day for signs of infection. Check for: Redness, swelling, or  pain. Fluid or blood. Pus or a bad smell. Protect the skin around the port site. Avoid wearing bra straps that rub or irritate the site. Protect the skin around your port from seat belts. Place a soft pad over your chest if needed. Bathe or shower as told by your health care provider. The site may get wet as long as you are not actively receiving an infusion. Return to your normal activities as told by your health care provider. Ask your health care provider what activities are safe for you. Carry a medical alert card or wear a medical alert bracelet at all times. This will let health care providers know that you have an implanted port in case of an emergency. Get help right away if: You have redness, swelling, or pain at the port site. You have fluid or blood coming from your port site. You have pus or a bad smell coming from the port site. You have a fever. Summary Implanted ports are usually placed in the chest for long-term IV access. Follow instructions from your health care provider about flushing the port and changing bandages (dressings). Take care of the area around your port by avoiding clothing that puts pressure on the area, and by watching for signs of infection. Protect the skin around your port from seat belts. Place a soft pad over your chest if needed. Get help right away if you have a fever or you have redness, swelling, pain, drainage, or a bad smell at the port site. This information is not intended to replace advice given to you by your health care provider. Make sure you discuss any questions you have with your health care provider. Document Revised: 04/13/2020 Document Reviewed: 06/08/2019 Elsevier Patient Education  2022 [employer]  

## 2020-10-16 DIAGNOSIS — C20 Malignant neoplasm of rectum: Secondary | ICD-10-CM | POA: Diagnosis not present

## 2020-10-17 ENCOUNTER — Telehealth: Payer: Self-pay | Admitting: *Deleted

## 2020-10-17 NOTE — Telephone Encounter (Signed)
Patient called to report she has had difficult time with her chemo and side effects and has missed more time that current FMLA has noted. She spoke with [employer] and was told office can send an addendum to her current FMLA with notation that each absence would be an entire shift of 12 hours and she works 3 days/week. Sent corrected [employer] form noting each episode is 12 hours and can occur 1-3 times/week.

## 2020-10-20 ENCOUNTER — Encounter: Payer: Self-pay | Admitting: Oncology

## 2020-10-23 ENCOUNTER — Other Ambulatory Visit: Payer: Self-pay | Admitting: Oncology

## 2020-10-27 ENCOUNTER — Other Ambulatory Visit: Payer: Self-pay | Admitting: Oncology

## 2020-10-27 ENCOUNTER — Inpatient Hospital Stay: Payer: 59

## 2020-10-27 ENCOUNTER — Encounter: Payer: Self-pay | Admitting: Nurse Practitioner

## 2020-10-27 ENCOUNTER — Other Ambulatory Visit: Payer: Self-pay

## 2020-10-27 ENCOUNTER — Inpatient Hospital Stay: Payer: 59 | Admitting: Nurse Practitioner

## 2020-10-27 VITALS — BP 127/94 | HR 85 | Temp 97.9°F | Resp 18 | Ht 66.0 in | Wt 161.0 lb

## 2020-10-27 VITALS — BP 117/79 | HR 86 | Temp 98.2°F | Resp 18

## 2020-10-27 DIAGNOSIS — Z5111 Encounter for antineoplastic chemotherapy: Secondary | ICD-10-CM | POA: Diagnosis not present

## 2020-10-27 DIAGNOSIS — C20 Malignant neoplasm of rectum: Secondary | ICD-10-CM

## 2020-10-27 LAB — CBC WITH DIFFERENTIAL (CANCER CENTER ONLY)
Abs Immature Granulocytes: 0.01 10*3/uL (ref 0.00–0.07)
Basophils Absolute: 0 10*3/uL (ref 0.0–0.1)
Basophils Relative: 0 %
Eosinophils Absolute: 0.1 10*3/uL (ref 0.0–0.5)
Eosinophils Relative: 1 %
HCT: 37.1 % (ref 36.0–46.0)
Hemoglobin: 11.9 g/dL — ABNORMAL LOW (ref 12.0–15.0)
Immature Granulocytes: 0 %
Lymphocytes Relative: 43 %
Lymphs Abs: 2.7 10*3/uL (ref 0.7–4.0)
MCH: 26.8 pg (ref 26.0–34.0)
MCHC: 32.1 g/dL (ref 30.0–36.0)
MCV: 83.6 fL (ref 80.0–100.0)
Monocytes Absolute: 1.3 10*3/uL — ABNORMAL HIGH (ref 0.1–1.0)
Monocytes Relative: 19 %
Neutro Abs: 2.4 10*3/uL (ref 1.7–7.7)
Neutrophils Relative %: 37 %
Platelet Count: 197 10*3/uL (ref 150–400)
RBC: 4.44 MIL/uL (ref 3.87–5.11)
RDW: 14.1 % (ref 11.5–15.5)
WBC Count: 6.5 10*3/uL (ref 4.0–10.5)
nRBC: 0 % (ref 0.0–0.2)

## 2020-10-27 LAB — CMP (CANCER CENTER ONLY)
ALT: 22 U/L (ref 0–44)
AST: 19 U/L (ref 15–41)
Albumin: 4.4 g/dL (ref 3.5–5.0)
Alkaline Phosphatase: 64 U/L (ref 38–126)
Anion gap: 9 (ref 5–15)
BUN: 8 mg/dL (ref 6–20)
CO2: 26 mmol/L (ref 22–32)
Calcium: 9.3 mg/dL (ref 8.9–10.3)
Chloride: 102 mmol/L (ref 98–111)
Creatinine: 0.71 mg/dL (ref 0.44–1.00)
GFR, Estimated: >60 mL/min (ref >60)
Glucose, Bld: 85 mg/dL (ref 70–99)
Potassium: 3.8 mmol/L (ref 3.5–5.1)
Sodium: 137 mmol/L (ref 135–145)
Total Bilirubin: 0.3 mg/dL (ref 0.3–1.2)
Total Protein: 7.4 g/dL (ref 6.5–8.1)

## 2020-10-27 LAB — PREGNANCY, URINE: Preg Test, Ur: NEGATIVE

## 2020-10-27 MED ORDER — DEXTROSE 5 % IV SOLN: Freq: Once | INTRAVENOUS | Status: AC

## 2020-10-27 MED ORDER — LEUCOVORIN CALCIUM INJECTION 350 MG
400.0000 mg/m2 | Freq: Once | INTRAMUSCULAR | Status: AC
  Administered 2020-10-27: 744 mg via INTRAVENOUS
  Filled 2020-10-27: qty 37.2

## 2020-10-27 MED ORDER — SODIUM CHLORIDE 0.9% FLUSH: 10.0000 mL | INTRAVENOUS | Status: DC | PRN

## 2020-10-27 MED ORDER — HEPARIN SOD (PORK) LOCK FLUSH 100 UNIT/ML IV SOLN: 500.0000 [IU] | Freq: Once | INTRAVENOUS | Status: DC | PRN

## 2020-10-27 MED ORDER — OXALIPLATIN CHEMO INJECTION 100 MG/20ML
85.0000 mg/m2 | Freq: Once | INTRAVENOUS | Status: AC
  Administered 2020-10-27: 160 mg via INTRAVENOUS
  Filled 2020-10-27: qty 32

## 2020-10-27 MED ORDER — DEXTROSE 5 % IV SOLN
Freq: Once | INTRAVENOUS | Status: AC
Start: 2020-10-27 — End: 2020-10-27

## 2020-10-27 MED ORDER — SODIUM CHLORIDE 0.9 % IV SOLN
10.0000 mg | Freq: Once | INTRAVENOUS | Status: AC
  Administered 2020-10-27: 10 mg via INTRAVENOUS
  Filled 2020-10-27: qty 1

## 2020-10-27 MED ORDER — SODIUM CHLORIDE 0.9 % IV SOLN
2400.0000 mg/m2 | INTRAVENOUS | Status: DC
  Administered 2020-10-27: 4450 mg via INTRAVENOUS
  Filled 2020-10-27: qty 89

## 2020-10-27 MED ORDER — PALONOSETRON HCL INJECTION 0.25 MG/5ML
0.2500 mg | Freq: Once | INTRAVENOUS | Status: AC
  Administered 2020-10-27: 0.25 mg via INTRAVENOUS
  Filled 2020-10-27: qty 5

## 2020-10-27 MED ORDER — FLUOROURACIL CHEMO INJECTION 2.5 GM/50ML
400.0000 mg/m2 | Freq: Once | INTRAVENOUS | Status: AC
  Administered 2020-10-27: 750 mg via INTRAVENOUS
  Filled 2020-10-27: qty 15

## 2020-10-27 NOTE — Progress Notes (Signed)
  [facility] OFFICE PROGRESS NOTE   Diagnosis: Rectal cancer  INTERVAL HISTORY:   Sharon Bennett returns as scheduled.  She completed cycle 3 FOLFOX 10/13/2020.  She had some nausea beginning day 2.  Compazine effective.  She had an episode of feeling hot, flushed and nauseated.  She feels she was likely dehydrated.  She is trying to push fluids.  No mouth sores.  No diarrhea.  Mild persistent cold sensitivity.  No numbness or tingling in the absence of cold exposure.  Less pressure with bowel movements.  No rectal bleeding.  Objective:  Vital signs in last 24 hours:  Blood pressure (!) 127/94, pulse 85, temperature 97.9 F (36.6 C), temperature source Oral, resp. rate 18, height 5' 6" (1.676 m), weight 161 lb (73 kg), SpO2 100 %.    HEENT: No thrush or ulcers. Resp: Lungs clear bilaterally. Cardio: Regular rate and rhythm. GI: Abdomen soft and nontender.  No hepatomegaly. Vascular: No leg edema. Neuro: Vibratory sense intact over the fingertips per tuning fork exam. Skin: Palms without erythema. Port-A-Cath without erythema.   Lab Results:  Lab Results  Component Value Date   WBC 6.5 10/27/2020   HGB 11.9 (L) 10/27/2020   HCT 37.1 10/27/2020   MCV 83.6 10/27/2020   PLT 197 10/27/2020   NEUTROABS 2.4 10/27/2020    Imaging:  No results found.  Medications: I have reviewed the patient's current medications.  Assessment/Plan: Rectal cancer  CT abdomen/pelvis 08/21/2020-large rectal mass measuring 4.4 x 2.6 x 5.5 cm, haziness in the mesorectal fat, obscuration of the fat plane between the mass and the adjacent posterior aspect of the uterus, numerous prominent mesorectal lymph nodes measuring up to 5 mm Colonoscopy 08/23/2020-fungating infiltrative nonobstructing large mass found in the rectum.  Mass was noncircumferential, measured 5 cm in length, 3 cm in diameter and was 7 cm from the anal verge.  No bleeding was present.   Pathology showed invasive colonic  adenocarcinoma, moderately differentiated.  No evidence of mismatch repair deficiency. Chest CT 09/01/2020-no evidence of metastatic disease MRI pelvis 09/02/2020-tumor at 10.4 cm from anal verge, 5.9 cm from the internal anal sphincter, T3, approximately 15 greater than 5 mm lymph nodes, N2b, multiple uterine fibroids Cycle 1 FOLFOX 09/15/2020 Cycle 2 FOLFOX 09/29/2020 Cycle 3 FOLFOX 10/13/2020 Cycle 4 FOLFOX 10/27/2020 Rectal bleeding, change in bowel habits secondary to #1 Uterine fibroids noted on MRI pelvis 09/02/2020 Port-A-Cath placement 09/09/2020, Interventional Radiology  Disposition: Sharon Bennett appears stable.  She has completed 3 cycles of FOLFOX.  Aside from mild nausea she seems to be tolerating chemotherapy well.  The nausea has been controlled with Compazine.  Plan to proceed with cycle 4 today as scheduled.  We reviewed the CBC from today.  Counts adequate to proceed with treatment as above.  Urine pregnancy test is negative.  She will return for lab, follow-up, cycle 5 FOLFOX in 2 weeks.  She will contact the office in the interim with any problems.  We discussed the importance of adequate fluid intake.    [name] ANP/GNP-BC   10/27/2020  9:21 AM

## 2020-10-27 NOTE — Progress Notes (Signed)
Patient seen by [name] NP today  Vitals are within treatment parameters.  Labs reviewed by [name] NP and are within treatment parameters.  Per physician team, patient is ready for treatment and there are NO modifications to the treatment plan.    Provider aware cmp in process

## 2020-10-27 NOTE — Progress Notes (Signed)
Patient presents for treatment. RN assessment completed along with the following:  Labs/vitals reviewed - Yes, and within treatment parameters.   Weight within 10% of previous measurement - Yes Oncology Treatment Attestation completed for current therapy- yes date:09/05/20 Informed consent completed and reflects current therapy/intent - Yes, on date 09/15/20             Provider progress note reviewed - Yes, today's provider note was reviewed. Treatment/Antibody/Supportive plan reviewed - "Yes, and there are no adjustments needed for today's treatment. S&H and other orders reviewed - Yes, and there are no additional orders identified. Previous treatment date reviewed - Yes, and the appropriate amount of time has elapsed between treatments. Last treatment 10/13/20. Basilia Jumbo LPN placed treatment note, reviewed.   Patient to proceed with treatment.  Pt here for D1C4 of FOLFOX.  Toelrated treatment well today without incidence.  Stable during and after treatment.  AVS reviewed. Vital signs stable prior to discharge.  Discharged in stable condition ambulatory with ambulatory 5FU pump.

## 2020-10-27 NOTE — Patient Instructions (Addendum)
Sharon Bennett  Discharge Instructions: Thank you for choosing [facility] to provide your oncology and hematology care.   If you have a lab appointment with the [facility], please go directly to the [facility] and check in at the registration area.   Wear comfortable clothing and clothing appropriate for easy access to any Portacath or PICC line.   We strive to give you quality time with your provider. You may need to reschedule your appointment if you arrive late (15 or more minutes).  Arriving late affects you and other patients whose appointments are after yours.  Also, if you miss three or more appointments without notifying the office, you may be dismissed from the clinic at the provider's discretion.      For prescription refill requests, have your pharmacy contact our office and allow 72 hours for refills to be completed.    Today you received the following chemotherapy and/or immunotherapy agents FOLFOX The chemotherapy medication bag should finish at 46 hours, 96 hours, or 7 days. For example, if your pump is scheduled for 46 hours and it was put on at 4:00 p.m., it should finish at 2:00 p.m. the day it is scheduled to come off regardless of your appointment time.     Estimated time to finish at 1130am on 10/29/20.   If the display on your pump reads "Low Volume" and it is beeping, take the batteries out of the pump and come to the cancer center for it to be taken off.   If the pump alarms go off prior to the pump reading "Low Volume" then call [phone] and someone can assist you.  If the plunger comes out and the chemotherapy medication is leaking out, please use your home chemo spill kit to clean up the spill. Do NOT use paper towels or other household products.  If you have problems or questions regarding your pump, please call either [phone] (24 hours a day) or the cancer center Monday-Friday 8:00 a.m.- 4:30 p.m. at the  clinic number and we will assist you. If you are unable to get assistance, then go to the nearest Emergency Department and ask the staff to contact the IV team for assistance.        To help prevent nausea and vomiting after your treatment, we encourage you to take your nausea medication as directed.  BELOW ARE SYMPTOMS THAT SHOULD BE REPORTED IMMEDIATELY: *FEVER GREATER THAN 100.4 F (38 C) OR HIGHER *CHILLS OR SWEATING *NAUSEA AND VOMITING THAT IS NOT CONTROLLED WITH YOUR NAUSEA MEDICATION *UNUSUAL SHORTNESS OF BREATH *UNUSUAL BRUISING OR BLEEDING *URINARY PROBLEMS (pain or burning when urinating, or frequent urination) *BOWEL PROBLEMS (unusual diarrhea, constipation, pain near the anus) TENDERNESS IN MOUTH AND THROAT WITH OR WITHOUT PRESENCE OF ULCERS (sore throat, sores in mouth, or a toothache) UNUSUAL RASH, SWELLING OR PAIN  UNUSUAL VAGINAL DISCHARGE OR ITCHING   Items with * indicate a potential emergency and should be followed up as soon as possible or go to the Emergency Department if any problems should occur.  Please show the CHEMOTHERAPY ALERT CARD or IMMUNOTHERAPY ALERT CARD at check-in to the Emergency Department and triage nurse.  Should you have questions after your visit or need to cancel or reschedule your appointment, please contact [facility]  Dept: [phone]  and follow the prompts.  Office hours are 8:00 a.m. to 4:30 p.m. Monday - Friday. Please note that voicemails left after 4:00 p.m.  may not be returned until the following business day.  We are closed weekends and major holidays. You have access to a nurse at all times for urgent questions. Please call the main number to the clinic Dept: [phone] and follow the prompts.   For any non-urgent questions, you may also contact your provider using MyChart. We now offer e-Visits for anyone 37 and older to request care online for non-urgent symptoms. For details visit  [email or web address].   Also download the MyChart app! Go to the app store, search "MyChart", open the app, select [facility], and log in with your MyChart username and password.  Due to Covid, a mask is required upon entering the hospital/clinic. If you do not have a mask, one will be given to you upon arrival. For doctor visits, patients may have 1 support person aged 44 or older with them. For treatment visits, patients cannot have anyone with them due to current Covid guidelines and our immunocompromised population.   Fluorouracil, 5-FU injection What is this medication? FLUOROURACIL, 5-FU (flure oh YOOR a sil) is a chemotherapy drug. It slows the growth of cancer cells. This medicine is used to treat many types of cancer like breast cancer, colon or rectal cancer, pancreatic cancer, and stomach cancer. This medicine may be used for other purposes; ask your health care provider or pharmacist if you have questions. COMMON BRAND NAME(S): Adrucil What should I tell my care team before I take this medication? They need to know if you have any of these conditions: blood disorders dihydropyrimidine dehydrogenase (DPD) deficiency infection (especially a virus infection such as chickenpox, cold sores, or herpes) kidney disease liver disease malnourished, poor nutrition recent or ongoing radiation therapy an unusual or allergic reaction to fluorouracil, other chemotherapy, other medicines, foods, dyes, or preservatives pregnant or trying to get pregnant breast-feeding How should I use this medication? This drug is given as an infusion or injection into a vein. It is administered in a hospital or clinic by a specially trained health care professional. Talk to your pediatrician regarding the use of this medicine in children. Special care may be needed. Overdosage: If you think you have taken too much of this medicine contact a poison control center or emergency room at once. NOTE: This  medicine is only for you. Do not share this medicine with others. What if I miss a dose? It is important not to miss your dose. Call your doctor or health care professional if you are unable to keep an appointment. What may interact with this medication? Do not take this medicine with any of the following medications: live virus vaccines This medicine may also interact with the following medications: medicines that treat or prevent blood clots like warfarin, enoxaparin, and dalteparin This list may not describe all possible interactions. Give your health care provider a list of all the medicines, herbs, non-prescription drugs, or dietary supplements you use. Also tell them if you smoke, drink alcohol, or use illegal drugs. Some items may interact with your medicine. What should I watch for while using this medication? Visit your doctor for checks on your progress. This drug may make you feel generally unwell. This is not uncommon, as chemotherapy can affect healthy cells as well as cancer cells. Report any side effects. Continue your course of treatment even though you feel ill unless your doctor tells you to stop. In some cases, you may be given additional medicines to help with side effects. Follow all directions for their use. Call your  doctor or health care professional for advice if you get a fever, chills or sore throat, or other symptoms of a cold or flu. Do not treat yourself. This drug decreases your body's ability to fight infections. Try to avoid being around people who are sick. This medicine may increase your risk to bruise or bleed. Call your doctor or health care professional if you notice any unusual bleeding. Be careful brushing and flossing your teeth or using a toothpick because you may get an infection or bleed more easily. If you have any dental work done, tell your dentist you are receiving this medicine. Avoid taking products that contain aspirin, acetaminophen, ibuprofen,  naproxen, or ketoprofen unless instructed by your doctor. These medicines may hide a fever. Do not become pregnant while taking this medicine. Women should inform their doctor if they wish to become pregnant or think they might be pregnant. There is a potential for serious side effects to an unborn child. Talk to your health care professional or pharmacist for more information. Do not breast-feed an infant while taking this medicine. Men should inform their doctor if they wish to father a child. This medicine may lower sperm counts. Do not treat diarrhea with over the counter products. Contact your doctor if you have diarrhea that lasts more than 2 days or if it is severe and watery. This medicine can make you more sensitive to the sun. Keep out of the sun. If you cannot avoid being in the sun, wear protective clothing and use sunscreen. Do not use sun lamps or tanning beds/booths. What side effects may I notice from receiving this medication? Side effects that you should report to your doctor or health care professional as soon as possible: allergic reactions like skin rash, itching or hives, swelling of the face, lips, or tongue low blood counts - this medicine may decrease the number of white blood cells, red blood cells and platelets. You may be at increased risk for infections and bleeding. signs of infection - fever or chills, cough, sore throat, pain or difficulty passing urine signs of decreased platelets or bleeding - bruising, pinpoint red spots on the skin, black, tarry stools, blood in the urine signs of decreased red blood cells - unusually weak or tired, fainting spells, lightheadedness breathing problems changes in vision chest pain mouth sores nausea and vomiting pain, swelling, redness at site where injected pain, tingling, numbness in the hands or feet redness, swelling, or sores on hands or feet stomach pain unusual bleeding Side effects that usually do not require medical  attention (report to your doctor or health care professional if they continue or are bothersome): changes in finger or toe nails diarrhea dry or itchy skin hair loss headache loss of appetite sensitivity of eyes to the light stomach upset unusually teary eyes This list may not describe all possible side effects. Call your doctor for medical advice about side effects. You may report side effects to FDA at 1-800-FDA-1088. Where should I keep my medication? This drug is given in a hospital or clinic and will not be stored at home. NOTE: This sheet is a summary. It may not cover all possible information. If you have questions about this medicine, talk to your doctor, pharmacist, or health care provider.  2022 Elsevier/Gold Standard (2018-12-23 15:00:03)   Oxaliplatin Injection What is this medication? OXALIPLATIN (ox AL i PLA tin) is a chemotherapy drug. It targets fast dividing cells, like cancer cells, and causes these cells to die. This medicine is  used to treat cancers of the colon and rectum, and many other cancers. This medicine may be used for other purposes; ask your health care provider or pharmacist if you have questions. COMMON BRAND NAME(S): Eloxatin What should I tell my care team before I take this medication? They need to know if you have any of these conditions: heart disease history of irregular heartbeat liver disease low blood counts, like white cells, platelets, or red blood cells lung or breathing disease, like asthma take medicines that treat or prevent blood clots tingling of the fingers or toes, or other nerve disorder an unusual or allergic reaction to oxaliplatin, other chemotherapy, other medicines, foods, dyes, or preservatives pregnant or trying to get pregnant breast-feeding How should I use this medication? This drug is given as an infusion into a vein. It is administered in a hospital or clinic by a specially trained health care professional. Talk to  your pediatrician regarding the use of this medicine in children. Special care may be needed. Overdosage: If you think you have taken too much of this medicine contact a poison control center or emergency room at once. NOTE: This medicine is only for you. Do not share this medicine with others. What if I miss a dose? It is important not to miss a dose. Call your doctor or health care professional if you are unable to keep an appointment. What may interact with this medication? Do not take this medicine with any of the following medications: cisapride dronedarone pimozide thioridazine This medicine may also interact with the following medications: aspirin and aspirin-like medicines certain medicines that treat or prevent blood clots like warfarin, apixaban, dabigatran, and rivaroxaban cisplatin cyclosporine diuretics medicines for infection like acyclovir, adefovir, amphotericin B, bacitracin, cidofovir, foscarnet, ganciclovir, gentamicin, pentamidine, vancomycin NSAIDs, medicines for pain and inflammation, like ibuprofen or naproxen other medicines that prolong the QT interval (an abnormal heart rhythm) pamidronate zoledronic acid This list may not describe all possible interactions. Give your health care provider a list of all the medicines, herbs, non-prescription drugs, or dietary supplements you use. Also tell them if you smoke, drink alcohol, or use illegal drugs. Some items may interact with your medicine. What should I watch for while using this medication? Your condition will be monitored carefully while you are receiving this medicine. You may need blood work done while you are taking this medicine. This medicine may make you feel generally unwell. This is not uncommon as chemotherapy can affect healthy cells as well as cancer cells. Report any side effects. Continue your course of treatment even though you feel ill unless your healthcare professional tells you to stop. This  medicine can make you more sensitive to cold. Do not drink cold drinks or use ice. Cover exposed skin before coming in contact with cold temperatures or cold objects. When out in cold weather wear warm clothing and cover your mouth and nose to warm the air that goes into your lungs. Tell your doctor if you get sensitive to the cold. Do not become pregnant while taking this medicine or for 9 months after stopping it. Women should inform their health care professional if they wish to become pregnant or think they might be pregnant. Men should not father a child while taking this medicine and for 6 months after stopping it. There is potential for serious side effects to an unborn child. Talk to your health care professional for more information. Do not breast-feed a child while taking this medicine or for 3 months  after stopping it. This medicine has caused ovarian failure in some women. This medicine may make it more difficult to get pregnant. Talk to your health care professional if you are concerned about your fertility. This medicine has caused decreased sperm counts in some men. This may make it more difficult to father a child. Talk to your health care professional if you are concerned about your fertility. This medicine may increase your risk of getting an infection. Call your health care professional for advice if you get a fever, chills, or sore throat, or other symptoms of a cold or flu. Do not treat yourself. Try to avoid being around people who are sick. Avoid taking medicines that contain aspirin, acetaminophen, ibuprofen, naproxen, or ketoprofen unless instructed by your health care professional. These medicines may hide a fever. Be careful brushing or flossing your teeth or using a toothpick because you may get an infection or bleed more easily. If you have any dental work done, tell your dentist you are receiving this medicine. What side effects may I notice from receiving this medication? Side  effects that you should report to your doctor or health care professional as soon as possible: allergic reactions like skin rash, itching or hives, swelling of the face, lips, or tongue breathing problems cough low blood counts - this medicine may decrease the number of white blood cells, red blood cells, and platelets. You may be at increased risk for infections and bleeding nausea, vomiting pain, redness, or irritation at site where injected pain, tingling, numbness in the hands or feet signs and symptoms of bleeding such as bloody or black, tarry stools; red or dark brown urine; spitting up blood or brown material that looks like coffee grounds; red spots on the skin; unusual bruising or bleeding from the eyes, gums, or nose signs and symptoms of a dangerous change in heartbeat or heart rhythm like chest pain; dizziness; fast, irregular heartbeat; palpitations; feeling faint or lightheaded; falls signs and symptoms of infection like fever; chills; cough; sore throat; pain or trouble passing urine signs and symptoms of liver injury like dark yellow or brown urine; general ill feeling or flu-like symptoms; light-colored stools; loss of appetite; nausea; right upper belly pain; unusually weak or tired; yellowing of the eyes or skin signs and symptoms of low red blood cells or anemia such as unusually weak or tired; feeling faint or lightheaded; falls signs and symptoms of muscle injury like dark urine; trouble passing urine or change in the amount of urine; unusually weak or tired; muscle pain; back pain Side effects that usually do not require medical attention (report to your doctor or health care professional if they continue or are bothersome): changes in taste diarrhea gas hair loss loss of appetite mouth sores This list may not describe all possible side effects. Call your doctor for medical advice about side effects. You may report side effects to FDA at 1-800-FDA-1088. Where should I  keep my medication? This drug is given in a hospital or clinic and will not be stored at home. NOTE: This sheet is a summary. It may not cover all possible information. If you have questions about this medicine, talk to your doctor, pharmacist, or health care provider.  2022 Elsevier/Gold Standard (2018-06-11 12:20:35)  Leucovorin injection What is this medication? LEUCOVORIN (loo koe VOR in) is used to prevent or treat the harmful effects of some medicines. This medicine is used to treat anemia caused by a low amount of folic acid in the body.  It is also used with 5-fluorouracil (5-FU) to treat colon cancer. This medicine may be used for other purposes; ask your health care provider or pharmacist if you have questions. What should I tell my care team before I take this medication? They need to know if you have any of these conditions: anemia from low levels of vitamin B-12 in the blood an unusual or allergic reaction to leucovorin, folic acid, other medicines, foods, dyes, or preservatives pregnant or trying to get pregnant breast-feeding How should I use this medication? This medicine is for injection into a muscle or into a vein. It is given by a health care professional in a hospital or clinic setting. Talk to your pediatrician regarding the use of this medicine in children. Special care may be needed. Overdosage: If you think you have taken too much of this medicine contact a poison control center or emergency room at once. NOTE: This medicine is only for you. Do not share this medicine with others. What if I miss a dose? This does not apply. What may interact with this medication? capecitabine fluorouracil phenobarbital phenytoin primidone trimethoprim-sulfamethoxazole This list may not describe all possible interactions. Give your health care provider a list of all the medicines, herbs, non-prescription drugs, or dietary supplements you use. Also tell them if you smoke, drink  alcohol, or use illegal drugs. Some items may interact with your medicine. What should I watch for while using this medication? Your condition will be monitored carefully while you are receiving this medicine. This medicine may increase the side effects of 5-fluorouracil, 5-FU. Tell your doctor or health care professional if you have diarrhea or mouth sores that do not get better or that get worse. What side effects may I notice from receiving this medication? Side effects that you should report to your doctor or health care professional as soon as possible: allergic reactions like skin rash, itching or hives, swelling of the face, lips, or tongue breathing problems fever, infection mouth sores unusual bleeding or bruising unusually weak or tired Side effects that usually do not require medical attention (report to your doctor or health care professional if they continue or are bothersome): constipation or diarrhea loss of appetite nausea, vomiting This list may not describe all possible side effects. Call your doctor for medical advice about side effects. You may report side effects to FDA at 1-800-FDA-1088. Where should I keep my medication? This drug is given in a hospital or clinic and will not be stored at home. NOTE: This sheet is a summary. It may not cover all possible information. If you have questions about this medicine, talk to your doctor, pharmacist, or health care provider.  2022 Elsevier/Gold Standard (2007-07-29 16:50:29)

## 2020-11-06 ENCOUNTER — Other Ambulatory Visit: Payer: Self-pay | Admitting: Oncology

## 2020-11-09 ENCOUNTER — Encounter: Payer: Self-pay | Admitting: Oncology

## 2020-11-10 ENCOUNTER — Inpatient Hospital Stay: Payer: 59

## 2020-11-10 ENCOUNTER — Other Ambulatory Visit: Payer: Self-pay

## 2020-11-10 ENCOUNTER — Inpatient Hospital Stay (HOSPITAL_BASED_OUTPATIENT_CLINIC_OR_DEPARTMENT_OTHER): Payer: 59 | Admitting: Nurse Practitioner

## 2020-11-10 ENCOUNTER — Encounter: Payer: Self-pay | Admitting: Nurse Practitioner

## 2020-11-10 ENCOUNTER — Encounter: Payer: Self-pay | Admitting: Oncology

## 2020-11-10 ENCOUNTER — Inpatient Hospital Stay: Payer: 59 | Attending: Oncology

## 2020-11-10 VITALS — BP 129/88 | HR 100 | Temp 98.0°F | Resp 19 | Ht 66.0 in | Wt 164.0 lb

## 2020-11-10 DIAGNOSIS — C20 Malignant neoplasm of rectum: Secondary | ICD-10-CM | POA: Insufficient documentation

## 2020-11-10 DIAGNOSIS — Z5111 Encounter for antineoplastic chemotherapy: Secondary | ICD-10-CM | POA: Insufficient documentation

## 2020-11-10 LAB — CBC WITH DIFFERENTIAL (CANCER CENTER ONLY)
Abs Immature Granulocytes: 0.02 10*3/uL (ref 0.00–0.07)
Basophils Absolute: 0 10*3/uL (ref 0.0–0.1)
Basophils Relative: 0 %
Eosinophils Absolute: 0.1 10*3/uL (ref 0.0–0.5)
Eosinophils Relative: 1 %
HCT: 35.3 % — ABNORMAL LOW (ref 36.0–46.0)
Hemoglobin: 11.5 g/dL — ABNORMAL LOW (ref 12.0–15.0)
Immature Granulocytes: 0 %
Lymphocytes Relative: 47 %
Lymphs Abs: 2.4 10*3/uL (ref 0.7–4.0)
MCH: 27.1 pg (ref 26.0–34.0)
MCHC: 32.6 g/dL (ref 30.0–36.0)
MCV: 83.3 fL (ref 80.0–100.0)
Monocytes Absolute: 0.9 10*3/uL (ref 0.1–1.0)
Monocytes Relative: 18 %
Neutro Abs: 1.7 10*3/uL (ref 1.7–7.7)
Neutrophils Relative %: 34 %
Platelet Count: 164 10*3/uL (ref 150–400)
RBC: 4.24 MIL/uL (ref 3.87–5.11)
RDW: 15.2 % (ref 11.5–15.5)
WBC Count: 5.1 10*3/uL (ref 4.0–10.5)
nRBC: 0 % (ref 0.0–0.2)

## 2020-11-10 LAB — CMP (CANCER CENTER ONLY)
ALT: 21 U/L (ref 0–44)
AST: 17 U/L (ref 15–41)
Albumin: 4.1 g/dL (ref 3.5–5.0)
Alkaline Phosphatase: 65 U/L (ref 38–126)
Anion gap: 9 (ref 5–15)
BUN: 12 mg/dL (ref 6–20)
CO2: 25 mmol/L (ref 22–32)
Calcium: 9.2 mg/dL (ref 8.9–10.3)
Chloride: 106 mmol/L (ref 98–111)
Creatinine: 0.7 mg/dL (ref 0.44–1.00)
GFR, Estimated: >60 mL/min (ref >60)
Glucose, Bld: 100 mg/dL — ABNORMAL HIGH (ref 70–99)
Potassium: 3.9 mmol/L (ref 3.5–5.1)
Sodium: 140 mmol/L (ref 135–145)
Total Bilirubin: 0.4 mg/dL (ref 0.3–1.2)
Total Protein: 6.9 g/dL (ref 6.5–8.1)

## 2020-11-10 LAB — PREGNANCY, URINE: Preg Test, Ur: NEGATIVE

## 2020-11-10 MED ORDER — LEUCOVORIN CALCIUM INJECTION 350 MG
400.0000 mg/m2 | Freq: Once | INTRAVENOUS | Status: AC
  Administered 2020-11-10: 744 mg via INTRAVENOUS
  Filled 2020-11-10: qty 37.2

## 2020-11-10 MED ORDER — FLUOROURACIL CHEMO INJECTION 2.5 GM/50ML
400.0000 mg/m2 | Freq: Once | INTRAVENOUS | Status: AC
  Administered 2020-11-10: 750 mg via INTRAVENOUS
  Filled 2020-11-10: qty 15

## 2020-11-10 MED ORDER — FAMOTIDINE 20 MG IN NS 100 ML IVPB
20.0000 mg | Freq: Once | INTRAVENOUS | Status: AC
  Administered 2020-11-10: 20 mg via INTRAVENOUS
  Filled 2020-11-10: qty 100

## 2020-11-10 MED ORDER — SODIUM CHLORIDE 0.9 % IV SOLN
2400.0000 mg/m2 | INTRAVENOUS | Status: DC
  Administered 2020-11-10: 4450 mg via INTRAVENOUS
  Filled 2020-11-10: qty 89

## 2020-11-10 MED ORDER — DIPHENHYDRAMINE HCL 50 MG/ML IJ SOLN
25.0000 mg | Freq: Once | INTRAMUSCULAR | Status: AC
  Administered 2020-11-10: 25 mg via INTRAVENOUS
  Filled 2020-11-10: qty 1

## 2020-11-10 MED ORDER — OXALIPLATIN CHEMO INJECTION 100 MG/20ML
85.0000 mg/m2 | Freq: Once | INTRAVENOUS | Status: AC
  Administered 2020-11-10: 160 mg via INTRAVENOUS
  Filled 2020-11-10: qty 32

## 2020-11-10 MED ORDER — SODIUM CHLORIDE 0.9 % IV SOLN
10.0000 mg | Freq: Once | INTRAVENOUS | Status: AC
  Administered 2020-11-10: 10 mg via INTRAVENOUS
  Filled 2020-11-10: qty 1

## 2020-11-10 MED ORDER — SODIUM CHLORIDE 0.9% FLUSH: 10.0000 mL | INTRAVENOUS | Status: DC | PRN

## 2020-11-10 MED ORDER — PALONOSETRON HCL INJECTION 0.25 MG/5ML
0.2500 mg | Freq: Once | INTRAVENOUS | Status: AC
  Administered 2020-11-10: 0.25 mg via INTRAVENOUS
  Filled 2020-11-10: qty 5

## 2020-11-10 MED ORDER — DEXTROSE 5 % IV SOLN: Freq: Once | INTRAVENOUS | Status: AC

## 2020-11-10 NOTE — Patient Instructions (Signed)
[facility]  Discharge Instructions: Thank you for choosing [facility] to provide your oncology and hematology care.   If you have a lab appointment with the Cancer Center, please go directly to the Cancer Center and check in at the registration area.   Wear comfortable clothing and clothing appropriate for easy access to any Portacath or PICC line.   We strive to give you quality time with your provider. You may need to reschedule your appointment if you arrive late (15 or more minutes).  Arriving late affects you and other patients whose appointments are after yours.  Also, if you miss three or more appointments without notifying the office, you may be dismissed from the clinic at the provider's discretion.      For prescription refill requests, have your pharmacy contact our office and allow 72 hours for refills to be completed.    Today you received the following chemotherapy and/or immunotherapy agents FOLFOX The chemotherapy medication bag should finish at 46 hours, 96 hours, or 7 days. For example, if your pump is scheduled for 46 hours and it was put on at 4:00 p.m., it should finish at 2:00 p.m. the day it is scheduled to come off regardless of your appointment time.     Estimated time to finish at 1130am on 10/29/20.   If the display on your pump reads "Low Volume" and it is beeping, take the batteries out of the pump and come to the cancer center for it to be taken off.   If the pump alarms go off prior to the pump reading "Low Volume" then call [phone] and someone can assist you.  If the plunger comes out and the chemotherapy medication is leaking out, please use your home chemo spill kit to clean up the spill. Do NOT use paper towels or other household products.  If you have problems or questions regarding your pump, please call either [phone] (24 hours a day) or the cancer center Monday-Friday 8:00 a.m.- 4:30 p.m. at the  clinic number and we will assist you. If you are unable to get assistance, then go to the nearest Emergency Department and ask the staff to contact the IV team for assistance.        To help prevent nausea and vomiting after your treatment, we encourage you to take your nausea medication as directed.  BELOW ARE SYMPTOMS THAT SHOULD BE REPORTED IMMEDIATELY: *FEVER GREATER THAN 100.4 F (38 C) OR HIGHER *CHILLS OR SWEATING *NAUSEA AND VOMITING THAT IS NOT CONTROLLED WITH YOUR NAUSEA MEDICATION *UNUSUAL SHORTNESS OF BREATH *UNUSUAL BRUISING OR BLEEDING *URINARY PROBLEMS (pain or burning when urinating, or frequent urination) *BOWEL PROBLEMS (unusual diarrhea, constipation, pain near the anus) TENDERNESS IN MOUTH AND THROAT WITH OR WITHOUT PRESENCE OF ULCERS (sore throat, sores in mouth, or a toothache) UNUSUAL RASH, SWELLING OR PAIN  UNUSUAL VAGINAL DISCHARGE OR ITCHING   Items with * indicate a potential emergency and should be followed up as soon as possible or go to the Emergency Department if any problems should occur.  Please show the CHEMOTHERAPY ALERT CARD or IMMUNOTHERAPY ALERT CARD at check-in to the Emergency Department and triage nurse.  Should you have questions after your visit or need to cancel or reschedule your appointment, please contact [facility]  Dept: [phone]  and follow the prompts.  Office hours are 8:00 a.m. to 4:30 p.m. Monday - Friday. Please note that voicemails left after 4:00 p.m.   may not be returned until the following business day.  We are closed weekends and major holidays. You have access to a nurse at all times for urgent questions. Please call the main number to the clinic Dept: [phone] and follow the prompts.   For any non-urgent questions, you may also contact your provider using MyChart. We now offer e-Visits for anyone 18 and older to request care online for non-urgent symptoms. For details visit  [email or web address].   Also download the MyChart app! Go to the app store, search "MyChart", open the app, select [facility], and log in with your MyChart username and password.  Due to Covid, a mask is required upon entering the hospital/clinic. If you do not have a mask, one will be given to you upon arrival. For doctor visits, patients may have 1 support person aged 18 or older with them. For treatment visits, patients cannot have anyone with them due to current Covid guidelines and our immunocompromised population.   Fluorouracil, 5-FU injection What is this medication? FLUOROURACIL, 5-FU (flure oh YOOR a sil) is a chemotherapy drug. It slows the growth of cancer cells. This medicine is used to treat many types of cancer like breast cancer, colon or rectal cancer, pancreatic cancer, and stomach cancer. This medicine may be used for other purposes; ask your health care provider or pharmacist if you have questions. COMMON BRAND NAME(S): Adrucil What should I tell my care team before I take this medication? They need to know if you have any of these conditions: blood disorders dihydropyrimidine dehydrogenase (DPD) deficiency infection (especially a virus infection such as chickenpox, cold sores, or herpes) kidney disease liver disease malnourished, poor nutrition recent or ongoing radiation therapy an unusual or allergic reaction to fluorouracil, other chemotherapy, other medicines, foods, dyes, or preservatives pregnant or trying to get pregnant breast-feeding How should I use this medication? This drug is given as an infusion or injection into a vein. It is administered in a hospital or clinic by a specially trained health care professional. Talk to your pediatrician regarding the use of this medicine in children. Special care may be needed. Overdosage: If you think you have taken too much of this medicine contact a poison control center or emergency room at once. NOTE: This  medicine is only for you. Do not share this medicine with others. What if I miss a dose? It is important not to miss your dose. Call your doctor or health care professional if you are unable to keep an appointment. What may interact with this medication? Do not take this medicine with any of the following medications: live virus vaccines This medicine may also interact with the following medications: medicines that treat or prevent blood clots like warfarin, enoxaparin, and dalteparin This list may not describe all possible interactions. Give your health care provider a list of all the medicines, herbs, non-prescription drugs, or dietary supplements you use. Also tell them if you smoke, drink alcohol, or use illegal drugs. Some items may interact with your medicine. What should I watch for while using this medication? Visit your doctor for checks on your progress. This drug may make you feel generally unwell. This is not uncommon, as chemotherapy can affect healthy cells as well as cancer cells. Report any side effects. Continue your course of treatment even though you feel ill unless your doctor tells you to stop. In some cases, you may be given additional medicines to help with side effects. Follow all directions for their use. Call your   doctor or health care professional for advice if you get a fever, chills or sore throat, or other symptoms of a cold or flu. Do not treat yourself. This drug decreases your body's ability to fight infections. Try to avoid being around people who are sick. This medicine may increase your risk to bruise or bleed. Call your doctor or health care professional if you notice any unusual bleeding. Be careful brushing and flossing your teeth or using a toothpick because you may get an infection or bleed more easily. If you have any dental work done, tell your dentist you are receiving this medicine. Avoid taking products that contain aspirin, acetaminophen, ibuprofen,  naproxen, or ketoprofen unless instructed by your doctor. These medicines may hide a fever. Do not become pregnant while taking this medicine. Women should inform their doctor if they wish to become pregnant or think they might be pregnant. There is a potential for serious side effects to an unborn child. Talk to your health care professional or pharmacist for more information. Do not breast-feed an infant while taking this medicine. Men should inform their doctor if they wish to father a child. This medicine may lower sperm counts. Do not treat diarrhea with over the counter products. Contact your doctor if you have diarrhea that lasts more than 2 days or if it is severe and watery. This medicine can make you more sensitive to the sun. Keep out of the sun. If you cannot avoid being in the sun, wear protective clothing and use sunscreen. Do not use sun lamps or tanning beds/booths. What side effects may I notice from receiving this medication? Side effects that you should report to your doctor or health care professional as soon as possible: allergic reactions like skin rash, itching or hives, swelling of the face, lips, or tongue low blood counts - this medicine may decrease the number of white blood cells, red blood cells and platelets. You may be at increased risk for infections and bleeding. signs of infection - fever or chills, cough, sore throat, pain or difficulty passing urine signs of decreased platelets or bleeding - bruising, pinpoint red spots on the skin, black, tarry stools, blood in the urine signs of decreased red blood cells - unusually weak or tired, fainting spells, lightheadedness breathing problems changes in vision chest pain mouth sores nausea and vomiting pain, swelling, redness at site where injected pain, tingling, numbness in the hands or feet redness, swelling, or sores on hands or feet stomach pain unusual bleeding Side effects that usually do not require medical  attention (report to your doctor or health care professional if they continue or are bothersome): changes in finger or toe nails diarrhea dry or itchy skin hair loss headache loss of appetite sensitivity of eyes to the light stomach upset unusually teary eyes This list may not describe all possible side effects. Call your doctor for medical advice about side effects. You may report side effects to FDA at 1-800-FDA-1088. Where should I keep my medication? This drug is given in a hospital or clinic and will not be stored at home. NOTE: This sheet is a summary. It may not cover all possible information. If you have questions about this medicine, talk to your doctor, pharmacist, or health care provider.  2022 Elsevier/Gold Standard (2018-12-23 15:00:03)   Oxaliplatin Injection What is this medication? OXALIPLATIN (ox AL i PLA tin) is a chemotherapy drug. It targets fast dividing cells, like cancer cells, and causes these cells to die. This medicine is   used to treat cancers of the colon and rectum, and many other cancers. This medicine may be used for other purposes; ask your health care provider or pharmacist if you have questions. COMMON BRAND NAME(S): Eloxatin What should I tell my care team before I take this medication? They need to know if you have any of these conditions: heart disease history of irregular heartbeat liver disease low blood counts, like white cells, platelets, or red blood cells lung or breathing disease, like asthma take medicines that treat or prevent blood clots tingling of the fingers or toes, or other nerve disorder an unusual or allergic reaction to oxaliplatin, other chemotherapy, other medicines, foods, dyes, or preservatives pregnant or trying to get pregnant breast-feeding How should I use this medication? This drug is given as an infusion into a vein. It is administered in a hospital or clinic by a specially trained health care professional. Talk to  your pediatrician regarding the use of this medicine in children. Special care may be needed. Overdosage: If you think you have taken too much of this medicine contact a poison control center or emergency room at once. NOTE: This medicine is only for you. Do not share this medicine with others. What if I miss a dose? It is important not to miss a dose. Call your doctor or health care professional if you are unable to keep an appointment. What may interact with this medication? Do not take this medicine with any of the following medications: cisapride dronedarone pimozide thioridazine This medicine may also interact with the following medications: aspirin and aspirin-like medicines certain medicines that treat or prevent blood clots like warfarin, apixaban, dabigatran, and rivaroxaban cisplatin cyclosporine diuretics medicines for infection like acyclovir, adefovir, amphotericin B, bacitracin, cidofovir, foscarnet, ganciclovir, gentamicin, pentamidine, vancomycin NSAIDs, medicines for pain and inflammation, like ibuprofen or naproxen other medicines that prolong the QT interval (an abnormal heart rhythm) pamidronate zoledronic acid This list may not describe all possible interactions. Give your health care provider a list of all the medicines, herbs, non-prescription drugs, or dietary supplements you use. Also tell them if you smoke, drink alcohol, or use illegal drugs. Some items may interact with your medicine. What should I watch for while using this medication? Your condition will be monitored carefully while you are receiving this medicine. You may need blood work done while you are taking this medicine. This medicine may make you feel generally unwell. This is not uncommon as chemotherapy can affect healthy cells as well as cancer cells. Report any side effects. Continue your course of treatment even though you feel ill unless your healthcare professional tells you to stop. This  medicine can make you more sensitive to cold. Do not drink cold drinks or use ice. Cover exposed skin before coming in contact with cold temperatures or cold objects. When out in cold weather wear warm clothing and cover your mouth and nose to warm the air that goes into your lungs. Tell your doctor if you get sensitive to the cold. Do not become pregnant while taking this medicine or for 9 months after stopping it. Women should inform their health care professional if they wish to become pregnant or think they might be pregnant. Men should not father a child while taking this medicine and for 6 months after stopping it. There is potential for serious side effects to an unborn child. Talk to your health care professional for more information. Do not breast-feed a child while taking this medicine or for 3 months   after stopping it. This medicine has caused ovarian failure in some women. This medicine may make it more difficult to get pregnant. Talk to your health care professional if you are concerned about your fertility. This medicine has caused decreased sperm counts in some men. This may make it more difficult to father a child. Talk to your health care professional if you are concerned about your fertility. This medicine may increase your risk of getting an infection. Call your health care professional for advice if you get a fever, chills, or sore throat, or other symptoms of a cold or flu. Do not treat yourself. Try to avoid being around people who are sick. Avoid taking medicines that contain aspirin, acetaminophen, ibuprofen, naproxen, or ketoprofen unless instructed by your health care professional. These medicines may hide a fever. Be careful brushing or flossing your teeth or using a toothpick because you may get an infection or bleed more easily. If you have any dental work done, tell your dentist you are receiving this medicine. What side effects may I notice from receiving this medication? Side  effects that you should report to your doctor or health care professional as soon as possible: allergic reactions like skin rash, itching or hives, swelling of the face, lips, or tongue breathing problems cough low blood counts - this medicine may decrease the number of white blood cells, red blood cells, and platelets. You may be at increased risk for infections and bleeding nausea, vomiting pain, redness, or irritation at site where injected pain, tingling, numbness in the hands or feet signs and symptoms of bleeding such as bloody or black, tarry stools; red or dark brown urine; spitting up blood or brown material that looks like coffee grounds; red spots on the skin; unusual bruising or bleeding from the eyes, gums, or nose signs and symptoms of a dangerous change in heartbeat or heart rhythm like chest pain; dizziness; fast, irregular heartbeat; palpitations; feeling faint or lightheaded; falls signs and symptoms of infection like fever; chills; cough; sore throat; pain or trouble passing urine signs and symptoms of liver injury like dark yellow or brown urine; general ill feeling or flu-like symptoms; light-colored stools; loss of appetite; nausea; right upper belly pain; unusually weak or tired; yellowing of the eyes or skin signs and symptoms of low red blood cells or anemia such as unusually weak or tired; feeling faint or lightheaded; falls signs and symptoms of muscle injury like dark urine; trouble passing urine or change in the amount of urine; unusually weak or tired; muscle pain; back pain Side effects that usually do not require medical attention (report to your doctor or health care professional if they continue or are bothersome): changes in taste diarrhea gas hair loss loss of appetite mouth sores This list may not describe all possible side effects. Call your doctor for medical advice about side effects. You may report side effects to FDA at 1-800-FDA-1088. Where should I  keep my medication? This drug is given in a hospital or clinic and will not be stored at home. NOTE: This sheet is a summary. It may not cover all possible information. If you have questions about this medicine, talk to your doctor, pharmacist, or health care provider.  2022 Elsevier/Gold Standard (2018-06-11 12:20:35)  Leucovorin injection What is this medication? LEUCOVORIN (loo koe VOR in) is used to prevent or treat the harmful effects of some medicines. This medicine is used to treat anemia caused by a low amount of folic acid in the body.   It is also used with 5-fluorouracil (5-FU) to treat colon cancer. This medicine may be used for other purposes; ask your health care provider or pharmacist if you have questions. What should I tell my care team before I take this medication? They need to know if you have any of these conditions: anemia from low levels of vitamin B-12 in the blood an unusual or allergic reaction to leucovorin, folic acid, other medicines, foods, dyes, or preservatives pregnant or trying to get pregnant breast-feeding How should I use this medication? This medicine is for injection into a muscle or into a vein. It is given by a health care professional in a hospital or clinic setting. Talk to your pediatrician regarding the use of this medicine in children. Special care may be needed. Overdosage: If you think you have taken too much of this medicine contact a poison control center or emergency room at once. NOTE: This medicine is only for you. Do not share this medicine with others. What if I miss a dose? This does not apply. What may interact with this medication? capecitabine fluorouracil phenobarbital phenytoin primidone trimethoprim-sulfamethoxazole This list may not describe all possible interactions. Give your health care provider a list of all the medicines, herbs, non-prescription drugs, or dietary supplements you use. Also tell them if you smoke, drink  alcohol, or use illegal drugs. Some items may interact with your medicine. What should I watch for while using this medication? Your condition will be monitored carefully while you are receiving this medicine. This medicine may increase the side effects of 5-fluorouracil, 5-FU. Tell your doctor or health care professional if you have diarrhea or mouth sores that do not get better or that get worse. What side effects may I notice from receiving this medication? Side effects that you should report to your doctor or health care professional as soon as possible: allergic reactions like skin rash, itching or hives, swelling of the face, lips, or tongue breathing problems fever, infection mouth sores unusual bleeding or bruising unusually weak or tired Side effects that usually do not require medical attention (report to your doctor or health care professional if they continue or are bothersome): constipation or diarrhea loss of appetite nausea, vomiting This list may not describe all possible side effects. Call your doctor for medical advice about side effects. You may report side effects to FDA at 1-800-FDA-1088. Where should I keep my medication? This drug is given in a hospital or clinic and will not be stored at home. NOTE: This sheet is a summary. It may not cover all possible information. If you have questions about this medicine, talk to your doctor, pharmacist, or health care provider.  2022 Elsevier/Gold Standard (2007-07-29 16:50:29)  

## 2020-11-10 NOTE — Progress Notes (Signed)
  [facility] OFFICE PROGRESS NOTE   Diagnosis: Colon cancer  INTERVAL HISTORY:   Ms. Mcclay returns as scheduled.  She completed cycle 4 FOLFOX 10/27/2020.  No significant nausea/vomiting.  No diarrhea.  Cold sensitivity lasted about 6 days.  After returning home day 1 she developed pruritus/redness over the palms, pruritus at the lips and palate.  The palate became sore.  No ulcers.  Symptoms continued for several days.  She took Benadryl intermittently with improvement.  She did not experience throat tightness, shortness of breath, chest pain.  No rash except over the palms which she thinks may have been due to scratching.  Objective:  Vital signs in last 24 hours:  Blood pressure 129/88, pulse 100, temperature 98 F (36.7 C), temperature source Oral, resp. rate 19, height $RemoveBe'5\' 6"'YIJjeQarF$  (1.676 m), weight 164 lb (74.4 kg), SpO2 100 %.    HEENT: No thrush or ulcers. Resp: Lungs clear bilaterally. Cardio: Regular rate and rhythm. GI: Abdomen soft and nontender.  No hepatomegaly. Vascular: No leg edema. Neuro: Vibratory sense intact over the fingertips per tuning fork exam. Skin: Palms without erythema. Port-A-Cath without erythema.   Lab Results:  Lab Results  Component Value Date   WBC 5.1 11/10/2020   HGB 11.5 (L) 11/10/2020   HCT 35.3 (L) 11/10/2020   MCV 83.3 11/10/2020   PLT 164 11/10/2020   NEUTROABS 1.7 11/10/2020    Imaging:  No results found.  Medications: I have reviewed the patient's current medications.  Assessment/Plan: Rectal cancer  CT abdomen/pelvis 08/21/2020-large rectal mass measuring 4.4 x 2.6 x 5.5 cm, haziness in the mesorectal fat, obscuration of the fat plane between the mass and the adjacent posterior aspect of the uterus, numerous prominent mesorectal lymph nodes measuring up to 5 mm Colonoscopy 08/23/2020-fungating infiltrative nonobstructing large mass found in the rectum.  Mass was noncircumferential, measured 5 cm in length, 3 cm in  diameter and was 7 cm from the anal verge.  No bleeding was present.   Pathology showed invasive colonic adenocarcinoma, moderately differentiated.  No evidence of mismatch repair deficiency. Chest CT 09/01/2020-no evidence of metastatic disease MRI pelvis 09/02/2020-tumor at 10.4 cm from anal verge, 5.9 cm from the internal anal sphincter, T3, approximately 15 greater than 5 mm lymph nodes, N2b, multiple uterine fibroids Cycle 1 FOLFOX 09/15/2020 Cycle 2 FOLFOX 09/29/2020 Cycle 3 FOLFOX 10/13/2020 Cycle 4 FOLFOX 10/27/2020 Rectal bleeding, change in bowel habits secondary to #1 Uterine fibroids noted on MRI pelvis 09/02/2020 Port-A-Cath placement 09/09/2020, Interventional Radiology  Disposition: Ms. Rebuck appears stable.  She has completed 4 cycles of FOLFOX.  She developed pruritus/erythema over the palms, pruritis lips and palate day 1 after leaving the office and continuing for several days.  Symptoms improved with Benadryl.  We discussed the potential for a severe allergic reaction with treatment today.  Recommend adjusting premedications to include Benadryl and Pepcid and lengthening Oxaliplatin infusion to 3 hours.  She agrees to proceed with cycle 5 FOLFOX today as scheduled.  Labs from today reviewed.  Adequate to proceed with treatment.  She will return for lab, follow-up, cycle 6 FOLFOX in 2 weeks.  She will contact the office in the interim with any problems.  Plan reviewed with [name].    [name] ANP/GNP-BC   11/10/2020  10:43 AM

## 2020-11-10 NOTE — Patient Instructions (Signed)
Implanted Port Home Guide An implanted port is a device that is placed under the skin. It is usually placed in the chest. The device can be used to give IV medicine, to take blood, or for dialysis. You may have an implanted port if: You need IV medicine that would be irritating to the small veins in your hands or arms. You need IV medicines, such as antibiotics, for a long period of time. You need IV nutrition for a long period of time. You need dialysis. When you have a port, your health care provider can choose to use the port instead of veins in your arms for these procedures. You may have fewer limitations when using a port than you would if you used other types of long-term IVs, and you will likely be able to return to normal activities after your incision heals. An implanted port has two main parts: Reservoir. The reservoir is the part where a needle is inserted to give medicines or draw blood. The reservoir is round. After it is placed, it appears as a small, raised area under your skin. Catheter. The catheter is a thin, flexible tube that connects the reservoir to a vein. Medicine that is inserted into the reservoir goes into the catheter and then into the vein. How is my port accessed? To access your port: A numbing cream may be placed on the skin over the port site. Your health care provider will put on a mask and sterile gloves. The skin over your port will be cleaned carefully with a germ-killing soap and allowed to dry. Your health care provider will gently pinch the port and insert a needle into it. Your health care provider will check for a blood return to make sure the port is in the vein and is not clogged. If your port needs to remain accessed to get medicine continuously (constant infusion), your health care provider will place a clear bandage (dressing) over the needle site. The dressing and needle will need to be changed every week, or as told by your health care provider. What  is flushing? Flushing helps keep the port from getting clogged. Follow instructions from your health care provider about how and when to flush the port. Ports are usually flushed with saline solution or a medicine called heparin. The need for flushing will depend on how the port is used: If the port is only used from time to time to give medicines or draw blood, the port may need to be flushed: Before and after medicines have been given. Before and after blood has been drawn. As part of routine maintenance. Flushing may be recommended every 4-6 weeks. If a constant infusion is running, the port may not need to be flushed. Throw away any syringes in a disposal container that is meant for sharp items (sharps container). You can buy a sharps container from a pharmacy, or you can make one by using an empty hard plastic bottle with a cover. How long will my port stay implanted? The port can stay in for as long as your health care provider thinks it is needed. When it is time for the port to come out, a surgery will be done to remove it. The surgery will be similar to the procedure that was done to put the port in. Follow these instructions at home:  Flush your port as told by your health care provider. If you need an infusion over several days, follow instructions from your health care provider about how   to take care of your port site. Make sure you: Wash your hands with soap and water before you change your dressing. If soap and water are not available, use alcohol-based hand sanitizer. Change your dressing as told by your health care provider. Place any used dressings or infusion bags into a plastic bag. Throw that bag in the trash. Keep the dressing that covers the needle clean and dry. Do not get it wet. Do not use scissors or sharp objects near the tube. Keep the tube clamped, unless it is being used. Check your port site every day for signs of infection. Check for: Redness, swelling, or  pain. Fluid or blood. Pus or a bad smell. Protect the skin around the port site. Avoid wearing bra straps that rub or irritate the site. Protect the skin around your port from seat belts. Place a soft pad over your chest if needed. Bathe or shower as told by your health care provider. The site may get wet as long as you are not actively receiving an infusion. Return to your normal activities as told by your health care provider. Ask your health care provider what activities are safe for you. Carry a medical alert card or wear a medical alert bracelet at all times. This will let health care providers know that you have an implanted port in case of an emergency. Get help right away if: You have redness, swelling, or pain at the port site. You have fluid or blood coming from your port site. You have pus or a bad smell coming from the port site. You have a fever. Summary Implanted ports are usually placed in the chest for long-term IV access. Follow instructions from your health care provider about flushing the port and changing bandages (dressings). Take care of the area around your port by avoiding clothing that puts pressure on the area, and by watching for signs of infection. Protect the skin around your port from seat belts. Place a soft pad over your chest if needed. Get help right away if you have a fever or you have redness, swelling, pain, drainage, or a bad smell at the port site. This information is not intended to replace advice given to you by your health care provider. Make sure you discuss any questions you have with your health care provider. Document Revised: 04/13/2020 Document Reviewed: 06/08/2019 Elsevier Patient Education  2022 [employer]  

## 2020-11-10 NOTE — Progress Notes (Signed)
Patient presents for treatment. RN assessment completed along with the following:  Labs/vitals reviewed - Yes, and within treatment parameters.   Weight within 10% of previous measurement - Yes Oncology Treatment Attestation completed for current therapy- Yes, on date 09/05/20 Informed consent completed and reflects current therapy/intent - Yes, on date 09/15/20             Provider progress note reviewed - Yes, today's provider note was reviewed. Treatment/Antibody/Supportive plan reviewed - Yes, and there are no adjustments needed for today's treatment. Adjustments to premeds and oxaliplatin infusion length have been made by Roselind Messier, pharmacist S&H and other orders reviewed - Yes, and there are no additional orders identified. Previous treatment date reviewed - Yes, and the appropriate amount of time has elapsed between treatments. Clinic Hand Off Received from - [name], NP  Patient to proceed with treatment.    Patient tolerated treatment without any issues.  Instructed patient to call office if she experience redness/itchiness in palms.  Patient verbalized understanding.   Patient given supplies to d/c pump at home.

## 2020-11-15 DIAGNOSIS — C20 Malignant neoplasm of rectum: Secondary | ICD-10-CM | POA: Diagnosis not present

## 2020-11-16 ENCOUNTER — Encounter: Payer: Self-pay | Admitting: Oncology

## 2020-11-20 ENCOUNTER — Other Ambulatory Visit: Payer: Self-pay | Admitting: Oncology

## 2020-11-21 ENCOUNTER — Encounter: Payer: Self-pay | Admitting: Oncology

## 2020-11-24 ENCOUNTER — Other Ambulatory Visit: Payer: Self-pay

## 2020-11-24 ENCOUNTER — Inpatient Hospital Stay: Payer: 59

## 2020-11-24 ENCOUNTER — Inpatient Hospital Stay: Payer: 59 | Admitting: Nurse Practitioner

## 2020-11-24 ENCOUNTER — Encounter: Payer: Self-pay | Admitting: Nurse Practitioner

## 2020-11-24 VITALS — BP 131/93 | Temp 98.0°F | Resp 18 | Ht 66.0 in | Wt 161.2 lb

## 2020-11-24 DIAGNOSIS — C20 Malignant neoplasm of rectum: Secondary | ICD-10-CM

## 2020-11-24 DIAGNOSIS — Z5111 Encounter for antineoplastic chemotherapy: Secondary | ICD-10-CM | POA: Diagnosis not present

## 2020-11-24 LAB — CMP (CANCER CENTER ONLY)
ALT: 23 U/L (ref 0–44)
AST: 17 U/L (ref 15–41)
Albumin: 4.1 g/dL (ref 3.5–5.0)
Alkaline Phosphatase: 71 U/L (ref 38–126)
Anion gap: 9 (ref 5–15)
BUN: 12 mg/dL (ref 6–20)
CO2: 25 mmol/L (ref 22–32)
Calcium: 8.7 mg/dL — ABNORMAL LOW (ref 8.9–10.3)
Chloride: 105 mmol/L (ref 98–111)
Creatinine: 0.73 mg/dL (ref 0.44–1.00)
GFR, Estimated: >60 mL/min (ref >60)
Glucose, Bld: 102 mg/dL — ABNORMAL HIGH (ref 70–99)
Potassium: 3.7 mmol/L (ref 3.5–5.1)
Sodium: 139 mmol/L (ref 135–145)
Total Bilirubin: 0.6 mg/dL (ref 0.3–1.2)
Total Protein: 7 g/dL (ref 6.5–8.1)

## 2020-11-24 LAB — CBC WITH DIFFERENTIAL (CANCER CENTER ONLY)
Abs Immature Granulocytes: 0.01 10*3/uL (ref 0.00–0.07)
Basophils Absolute: 0 10*3/uL (ref 0.0–0.1)
Basophils Relative: 1 %
Eosinophils Absolute: 0 10*3/uL (ref 0.0–0.5)
Eosinophils Relative: 1 %
HCT: 36.5 % (ref 36.0–46.0)
Hemoglobin: 11.9 g/dL — ABNORMAL LOW (ref 12.0–15.0)
Immature Granulocytes: 0 %
Lymphocytes Relative: 42 %
Lymphs Abs: 1.8 10*3/uL (ref 0.7–4.0)
MCH: 27 pg (ref 26.0–34.0)
MCHC: 32.6 g/dL (ref 30.0–36.0)
MCV: 82.8 fL (ref 80.0–100.0)
Monocytes Absolute: 0.7 10*3/uL (ref 0.1–1.0)
Monocytes Relative: 17 %
Neutro Abs: 1.6 10*3/uL — ABNORMAL LOW (ref 1.7–7.7)
Neutrophils Relative %: 39 %
Platelet Count: 180 10*3/uL (ref 150–400)
RBC: 4.41 MIL/uL (ref 3.87–5.11)
RDW: 16 % — ABNORMAL HIGH (ref 11.5–15.5)
WBC Count: 4.2 10*3/uL (ref 4.0–10.5)
nRBC: 0 % (ref 0.0–0.2)

## 2020-11-24 LAB — PREGNANCY, URINE: Preg Test, Ur: NEGATIVE

## 2020-11-24 MED ORDER — DEXTROSE 5 % IV SOLN: Freq: Once | INTRAVENOUS | Status: AC

## 2020-11-24 MED ORDER — SODIUM CHLORIDE 0.9 % IV SOLN
10.0000 mg | Freq: Once | INTRAVENOUS | Status: AC
  Administered 2020-11-24: 10 mg via INTRAVENOUS
  Filled 2020-11-24: qty 1

## 2020-11-24 MED ORDER — FAMOTIDINE 20 MG IN NS 100 ML IVPB
20.0000 mg | Freq: Once | INTRAVENOUS | Status: AC
  Administered 2020-11-24: 20 mg via INTRAVENOUS
  Filled 2020-11-24: qty 100

## 2020-11-24 MED ORDER — PALONOSETRON HCL INJECTION 0.25 MG/5ML
0.2500 mg | Freq: Once | INTRAVENOUS | Status: AC
  Administered 2020-11-24: 0.25 mg via INTRAVENOUS
  Filled 2020-11-24: qty 5

## 2020-11-24 MED ORDER — SODIUM CHLORIDE 0.9 % IV SOLN
2400.0000 mg/m2 | INTRAVENOUS | Status: DC
  Administered 2020-11-24: 4450 mg via INTRAVENOUS
  Filled 2020-11-24: qty 89

## 2020-11-24 MED ORDER — DIPHENHYDRAMINE HCL 50 MG/ML IJ SOLN
25.0000 mg | Freq: Once | INTRAMUSCULAR | Status: AC
Start: 2020-11-24 — End: 2020-11-24
  Administered 2020-11-24: 25 mg via INTRAVENOUS
  Filled 2020-11-24: qty 1

## 2020-11-24 MED ORDER — LEUCOVORIN CALCIUM INJECTION 350 MG
400.0000 mg/m2 | Freq: Once | INTRAVENOUS | Status: AC
  Administered 2020-11-24: 744 mg via INTRAVENOUS
  Filled 2020-11-24: qty 37.2

## 2020-11-24 MED ORDER — OXALIPLATIN CHEMO INJECTION 100 MG/20ML
85.0000 mg/m2 | Freq: Once | INTRAVENOUS | Status: AC
  Administered 2020-11-24: 160 mg via INTRAVENOUS
  Filled 2020-11-24: qty 32

## 2020-11-24 MED ORDER — FLUOROURACIL CHEMO INJECTION 2.5 GM/50ML
400.0000 mg/m2 | Freq: Once | INTRAVENOUS | Status: AC
  Administered 2020-11-24: 750 mg via INTRAVENOUS
  Filled 2020-11-24: qty 15

## 2020-11-24 NOTE — Progress Notes (Signed)
Patient presents for treatment. RN assessment completed along with the following:  Labs/vitals reviewed - Yes, and within treatment parameters.   Weight within 10% of previous measurement - Yes Oncology Treatment Attestation completed for current therapy- Yes, on date 09/05/20 Informed consent completed and reflects current therapy/intent - Yes, on date 09/15/20             Provider progress note reviewed - Yes, today's provider note was reviewed. Treatment/Antibody/Supportive plan reviewed - Yes, and there are no adjustments needed for today's treatment. S&H and other orders reviewed - Yes, and there are no additional orders identified. Previous treatment date reviewed - Yes, and the appropriate amount of time has elapsed between treatments. Clinic Hand Off Received from - Lattie Haw T,NP  Patient to proceed with treatment.

## 2020-11-24 NOTE — Patient Instructions (Signed)
Sharon Bennett  Discharge Instructions: Thank you for choosing [facility] to provide your oncology and hematology care.   If you have a lab appointment with the [facility], please go directly to the [facility] and check in at the registration area.   Wear comfortable clothing and clothing appropriate for easy access to any Portacath or PICC line.   We strive to give you quality time with your provider. You may need to reschedule your appointment if you arrive late (15 or more minutes).  Arriving late affects you and other patients whose appointments are after yours.  Also, if you miss three or more appointments without notifying the office, you may be dismissed from the clinic at the provider's discretion.      For prescription refill requests, have your pharmacy contact our office and allow 72 hours for refills to be completed.    Today you received the following chemotherapy and/or immunotherapy agents oxaliplatin, leucovorin, fluorouracil      To help prevent nausea and vomiting after your treatment, we encourage you to take your nausea medication as directed.  BELOW ARE SYMPTOMS THAT SHOULD BE REPORTED IMMEDIATELY: *FEVER GREATER THAN 100.4 F (38 C) OR HIGHER *CHILLS OR SWEATING *NAUSEA AND VOMITING THAT IS NOT CONTROLLED WITH YOUR NAUSEA MEDICATION *UNUSUAL SHORTNESS OF BREATH *UNUSUAL BRUISING OR BLEEDING *URINARY PROBLEMS (pain or burning when urinating, or frequent urination) *BOWEL PROBLEMS (unusual diarrhea, constipation, pain near the anus) TENDERNESS IN MOUTH AND THROAT WITH OR WITHOUT PRESENCE OF ULCERS (sore throat, sores in mouth, or a toothache) UNUSUAL RASH, SWELLING OR PAIN  UNUSUAL VAGINAL DISCHARGE OR ITCHING   Items with * indicate a potential emergency and should be followed up as soon as possible or go to the Emergency Department if any problems should occur.  Please show the CHEMOTHERAPY ALERT CARD or IMMUNOTHERAPY  ALERT CARD at check-in to the Emergency Department and triage nurse.  Should you have questions after your visit or need to cancel or reschedule your appointment, please contact [facility]  Dept: [phone]  and follow the prompts.  Office hours are 8:00 a.m. to 4:30 p.m. Monday - Friday. Please note that voicemails left after 4:00 p.m. may not be returned until the following business day.  We are closed weekends and major holidays. You have access to a nurse at all times for urgent questions. Please call the main number to the clinic Dept: [phone] and follow the prompts.   For any non-urgent questions, you may also contact your provider using MyChart. We now offer e-Visits for anyone 37 and older to request care online for non-urgent symptoms. For details visit [email or web address].   Also download the MyChart app! Go to the app store, search "MyChart", open the app, select [facility], and log in with your MyChart username and password.  Due to Covid, a mask is required upon entering the hospital/clinic. If you do not have a mask, one will be given to you upon arrival. For doctor visits, patients may have 1 support person aged 7 or older with them. For treatment visits, patients cannot have anyone with them due to current Covid guidelines and our immunocompromised population.   Oxaliplatin Injection What is this medication? OXALIPLATIN (ox AL i PLA tin) is a chemotherapy drug. It targets fast dividing cells, like cancer cells, and causes these cells to die. This medicine is used to treat cancers of the colon and rectum, and many other cancers. This  medicine may be used for other purposes; ask your health care provider or pharmacist if you have questions. COMMON BRAND NAME(S): Eloxatin What should I tell my care team before I take this medication? They need to know if you have any of these conditions: heart disease history of irregular heartbeat liver  disease low blood counts, like white cells, platelets, or red blood cells lung or breathing disease, like asthma take medicines that treat or prevent blood clots tingling of the fingers or toes, or other nerve disorder an unusual or allergic reaction to oxaliplatin, other chemotherapy, other medicines, foods, dyes, or preservatives pregnant or trying to get pregnant breast-feeding How should I use this medication? This drug is given as an infusion into a vein. It is administered in a hospital or clinic by a specially trained health care professional. Talk to your pediatrician regarding the use of this medicine in children. Special care may be needed. Overdosage: If you think you have taken too much of this medicine contact a poison control center or emergency room at once. NOTE: This medicine is only for you. Do not share this medicine with others. What if I miss a dose? It is important not to miss a dose. Call your doctor or health care professional if you are unable to keep an appointment. What may interact with this medication? Do not take this medicine with any of the following medications: cisapride dronedarone pimozide thioridazine This medicine may also interact with the following medications: aspirin and aspirin-like medicines certain medicines that treat or prevent blood clots like warfarin, apixaban, dabigatran, and rivaroxaban cisplatin cyclosporine diuretics medicines for infection like acyclovir, adefovir, amphotericin B, bacitracin, cidofovir, foscarnet, ganciclovir, gentamicin, pentamidine, vancomycin NSAIDs, medicines for pain and inflammation, like ibuprofen or naproxen other medicines that prolong the QT interval (an abnormal heart rhythm) pamidronate zoledronic acid This list may not describe all possible interactions. Give your health care provider a list of all the medicines, herbs, non-prescription drugs, or dietary supplements you use. Also tell them if you  smoke, drink alcohol, or use illegal drugs. Some items may interact with your medicine. What should I watch for while using this medication? Your condition will be monitored carefully while you are receiving this medicine. You may need blood work done while you are taking this medicine. This medicine may make you feel generally unwell. This is not uncommon as chemotherapy can affect healthy cells as well as cancer cells. Report any side effects. Continue your course of treatment even though you feel ill unless your healthcare professional tells you to stop. This medicine can make you more sensitive to cold. Do not drink cold drinks or use ice. Cover exposed skin before coming in contact with cold temperatures or cold objects. When out in cold weather wear warm clothing and cover your mouth and nose to warm the air that goes into your lungs. Tell your doctor if you get sensitive to the cold. Do not become pregnant while taking this medicine or for 9 months after stopping it. Women should inform their health care professional if they wish to become pregnant or think they might be pregnant. Men should not father a child while taking this medicine and for 6 months after stopping it. There is potential for serious side effects to an unborn child. Talk to your health care professional for more information. Do not breast-feed a child while taking this medicine or for 3 months after stopping it. This medicine has caused ovarian failure in some women. This medicine  may make it more difficult to get pregnant. Talk to your health care professional if you are concerned about your fertility. This medicine has caused decreased sperm counts in some men. This may make it more difficult to father a child. Talk to your health care professional if you are concerned about your fertility. This medicine may increase your risk of getting an infection. Call your health care professional for advice if you get a fever, chills, or  sore throat, or other symptoms of a cold or flu. Do not treat yourself. Try to avoid being around people who are sick. Avoid taking medicines that contain aspirin, acetaminophen, ibuprofen, naproxen, or ketoprofen unless instructed by your health care professional. These medicines may hide a fever. Be careful brushing or flossing your teeth or using a toothpick because you may get an infection or bleed more easily. If you have any dental work done, tell your dentist you are receiving this medicine. What side effects may I notice from receiving this medication? Side effects that you should report to your doctor or health care professional as soon as possible: allergic reactions like skin rash, itching or hives, swelling of the face, lips, or tongue breathing problems cough low blood counts - this medicine may decrease the number of white blood cells, red blood cells, and platelets. You may be at increased risk for infections and bleeding nausea, vomiting pain, redness, or irritation at site where injected pain, tingling, numbness in the hands or feet signs and symptoms of bleeding such as bloody or black, tarry stools; red or dark brown urine; spitting up blood or brown material that looks like coffee grounds; red spots on the skin; unusual bruising or bleeding from the eyes, gums, or nose signs and symptoms of a dangerous change in heartbeat or heart rhythm like chest pain; dizziness; fast, irregular heartbeat; palpitations; feeling faint or lightheaded; falls signs and symptoms of infection like fever; chills; cough; sore throat; pain or trouble passing urine signs and symptoms of liver injury like dark yellow or brown urine; general ill feeling or flu-like symptoms; light-colored stools; loss of appetite; nausea; right upper belly pain; unusually weak or tired; yellowing of the eyes or skin signs and symptoms of low red blood cells or anemia such as unusually weak or tired; feeling faint or  lightheaded; falls signs and symptoms of muscle injury like dark urine; trouble passing urine or change in the amount of urine; unusually weak or tired; muscle pain; back pain Side effects that usually do not require medical attention (report to your doctor or health care professional if they continue or are bothersome): changes in taste diarrhea gas hair loss loss of appetite mouth sores This list may not describe all possible side effects. Call your doctor for medical advice about side effects. You may report side effects to FDA at 1-800-FDA-1088. Where should I keep my medication? This drug is given in a hospital or clinic and will not be stored at home. NOTE: This sheet is a summary. It may not cover all possible information. If you have questions about this medicine, talk to your doctor, pharmacist, or health care provider.  2022 Elsevier/Gold Standard (2018-06-11 12:20:35)  Leucovorin injection What is this medication? LEUCOVORIN (loo koe VOR in) is used to prevent or treat the harmful effects of some medicines. This medicine is used to treat anemia caused by a low amount of folic acid in the body. It is also used with 5-fluorouracil (5-FU) to treat colon cancer. This medicine may  be used for other purposes; ask your health care provider or pharmacist if you have questions. What should I tell my care team before I take this medication? They need to know if you have any of these conditions: anemia from low levels of vitamin B-12 in the blood an unusual or allergic reaction to leucovorin, folic acid, other medicines, foods, dyes, or preservatives pregnant or trying to get pregnant breast-feeding How should I use this medication? This medicine is for injection into a muscle or into a vein. It is given by a health care professional in a hospital or clinic setting. Talk to your pediatrician regarding the use of this medicine in children. Special care may be needed. Overdosage: If you  think you have taken too much of this medicine contact a poison control center or emergency room at once. NOTE: This medicine is only for you. Do not share this medicine with others. What if I miss a dose? This does not apply. What may interact with this medication? capecitabine fluorouracil phenobarbital phenytoin primidone trimethoprim-sulfamethoxazole This list may not describe all possible interactions. Give your health care provider a list of all the medicines, herbs, non-prescription drugs, or dietary supplements you use. Also tell them if you smoke, drink alcohol, or use illegal drugs. Some items may interact with your medicine. What should I watch for while using this medication? Your condition will be monitored carefully while you are receiving this medicine. This medicine may increase the side effects of 5-fluorouracil, 5-FU. Tell your doctor or health care professional if you have diarrhea or mouth sores that do not get better or that get worse. What side effects may I notice from receiving this medication? Side effects that you should report to your doctor or health care professional as soon as possible: allergic reactions like skin rash, itching or hives, swelling of the face, lips, or tongue breathing problems fever, infection mouth sores unusual bleeding or bruising unusually weak or tired Side effects that usually do not require medical attention (report to your doctor or health care professional if they continue or are bothersome): constipation or diarrhea loss of appetite nausea, vomiting This list may not describe all possible side effects. Call your doctor for medical advice about side effects. You may report side effects to FDA at 1-800-FDA-1088. Where should I keep my medication? This drug is given in a hospital or clinic and will not be stored at home. NOTE: This sheet is a summary. It may not cover all possible information. If you have questions about this  medicine, talk to your doctor, pharmacist, or health care provider.  2022 Elsevier/Gold Standard (2007-07-29 16:50:29)  Fluorouracil, 5-FU injection What is this medication? FLUOROURACIL, 5-FU (flure oh YOOR a sil) is a chemotherapy drug. It slows the growth of cancer cells. This medicine is used to treat many types of cancer like breast cancer, colon or rectal cancer, pancreatic cancer, and stomach cancer. This medicine may be used for other purposes; ask your health care provider or pharmacist if you have questions. COMMON BRAND NAME(S): Adrucil What should I tell my care team before I take this medication? They need to know if you have any of these conditions: blood disorders dihydropyrimidine dehydrogenase (DPD) deficiency infection (especially a virus infection such as chickenpox, cold sores, or herpes) kidney disease liver disease malnourished, poor nutrition recent or ongoing radiation therapy an unusual or allergic reaction to fluorouracil, other chemotherapy, other medicines, foods, dyes, or preservatives pregnant or trying to get pregnant breast-feeding How should I  use this medication? This drug is given as an infusion or injection into a vein. It is administered in a hospital or clinic by a specially trained health care professional. Talk to your pediatrician regarding the use of this medicine in children. Special care may be needed. Overdosage: If you think you have taken too much of this medicine contact a poison control center or emergency room at once. NOTE: This medicine is only for you. Do not share this medicine with others. What if I miss a dose? It is important not to miss your dose. Call your doctor or health care professional if you are unable to keep an appointment. What may interact with this medication? Do not take this medicine with any of the following medications: live virus vaccines This medicine may also interact with the following medications: medicines  that treat or prevent blood clots like warfarin, enoxaparin, and dalteparin This list may not describe all possible interactions. Give your health care provider a list of all the medicines, herbs, non-prescription drugs, or dietary supplements you use. Also tell them if you smoke, drink alcohol, or use illegal drugs. Some items may interact with your medicine. What should I watch for while using this medication? Visit your doctor for checks on your progress. This drug may make you feel generally unwell. This is not uncommon, as chemotherapy can affect healthy cells as well as cancer cells. Report any side effects. Continue your course of treatment even though you feel ill unless your doctor tells you to stop. In some cases, you may be given additional medicines to help with side effects. Follow all directions for their use. Call your doctor or health care professional for advice if you get a fever, chills or sore throat, or other symptoms of a cold or flu. Do not treat yourself. This drug decreases your body's ability to fight infections. Try to avoid being around people who are sick. This medicine may increase your risk to bruise or bleed. Call your doctor or health care professional if you notice any unusual bleeding. Be careful brushing and flossing your teeth or using a toothpick because you may get an infection or bleed more easily. If you have any dental work done, tell your dentist you are receiving this medicine. Avoid taking products that contain aspirin, acetaminophen, ibuprofen, naproxen, or ketoprofen unless instructed by your doctor. These medicines may hide a fever. Do not become pregnant while taking this medicine. Women should inform their doctor if they wish to become pregnant or think they might be pregnant. There is a potential for serious side effects to an unborn child. Talk to your health care professional or pharmacist for more information. Do not breast-feed an infant while taking  this medicine. Men should inform their doctor if they wish to father a child. This medicine may lower sperm counts. Do not treat diarrhea with over the counter products. Contact your doctor if you have diarrhea that lasts more than 2 days or if it is severe and watery. This medicine can make you more sensitive to the sun. Keep out of the sun. If you cannot avoid being in the sun, wear protective clothing and use sunscreen. Do not use sun lamps or tanning beds/booths. What side effects may I notice from receiving this medication? Side effects that you should report to your doctor or health care professional as soon as possible: allergic reactions like skin rash, itching or hives, swelling of the face, lips, or tongue low blood counts - this medicine  may decrease the number of white blood cells, red blood cells and platelets. You may be at increased risk for infections and bleeding. signs of infection - fever or chills, cough, sore throat, pain or difficulty passing urine signs of decreased platelets or bleeding - bruising, pinpoint red spots on the skin, black, tarry stools, blood in the urine signs of decreased red blood cells - unusually weak or tired, fainting spells, lightheadedness breathing problems changes in vision chest pain mouth sores nausea and vomiting pain, swelling, redness at site where injected pain, tingling, numbness in the hands or feet redness, swelling, or sores on hands or feet stomach pain unusual bleeding Side effects that usually do not require medical attention (report to your doctor or health care professional if they continue or are bothersome): changes in finger or toe nails diarrhea dry or itchy skin hair loss headache loss of appetite sensitivity of eyes to the light stomach upset unusually teary eyes This list may not describe all possible side effects. Call your doctor for medical advice about side effects. You may report side effects to FDA at  1-800-FDA-1088. Where should I keep my medication? This drug is given in a hospital or clinic and will not be stored at home. NOTE: This sheet is a summary. It may not cover all possible information. If you have questions about this medicine, talk to your doctor, pharmacist, or health care provider.  2022 [employer]/Gold Standard (2018-12-23 15:00:03)  The chemotherapy medication bag should finish at 46 hours, 96 hours, or 7 days. For example, if your pump is scheduled for 46 hours and it was put on at 4:00 p.m., it should finish at 2:00 p.m. the day it is scheduled to come off regardless of your appointment time.     Estimated time to finish at 245 Saturday November 26, 2020.   If the display on your pump reads "Low Volume" and it is beeping, take the batteries out of the pump and come to the cancer center for it to be taken off.   If the pump alarms go off prior to the pump reading "Low Volume" then call [phone] and someone can assist you.  If the plunger comes out and the chemotherapy medication is leaking out, please use your home chemo spill kit to clean up the spill. Do NOT use paper towels or other household products.  If you have problems or questions regarding your pump, please call either [phone] (24 hours a day) or the cancer center Monday-Friday 8:00 a.m.- 4:30 p.m. at the clinic number and we will assist you. If you are unable to get assistance, then go to the nearest Emergency Department and ask the staff to contact the IV team for assistance.

## 2020-11-24 NOTE — Progress Notes (Signed)
[place] OFFICE PROGRESS NOTE   Diagnosis: Colon cancer  INTERVAL HISTORY:   Sharon Bennett returns as scheduled.  She completed cycle 5 FOLFOX 11/10/2020.  She did not experience the pruritus that she noted after cycle 4.  She had a single episode of nausea/vomiting.  No mouth sores.  No diarrhea.  Cold sensitivity lasted about 12 days.  No persistent neuropathy symptoms.  She notes less rectal pressure.  No rectal bleeding.  Objective:  Vital signs in last 24 hours:  Blood pressure (!) 131/93, temperature 98 F (36.7 C), resp. rate 18, height _0  (1.676 m), weight 161 lb 3.2 oz (73.1 kg), SpO2 99 %.    HEENT: No thrush or ulcers. Resp: Lungs clear bilaterally. Cardio: Regular rate and rhythm. GI: Abdomen soft and nontender.  No hepatomegaly. Vascular: No leg edema. Neuro: Vibratory sense intact over the fingertips per tuning fork exam. Skin: Palms without erythema. Port-A-Cath without erythema.   Lab Results:  Lab Results  Component Value Date   WBC 5.1 11/10/2020   HGB 11.5 (L) 11/10/2020   HCT 35.3 (L) 11/10/2020   MCV 83.3 11/10/2020   PLT 164 11/10/2020   NEUTROABS 1.7 11/10/2020    Imaging:  No results found.  Medications: I have reviewed the patient's current medications.  Assessment/Plan: Rectal cancer  CT abdomen/pelvis 08/21/2020-large rectal mass measuring 4.4 x 2.6 x 5.5 cm, haziness in the mesorectal fat, obscuration of the fat plane between the mass and the adjacent posterior aspect of the uterus, numerous prominent mesorectal lymph nodes measuring up to 5 mm Colonoscopy 08/23/2020-fungating infiltrative nonobstructing large mass found in the rectum.  Mass was noncircumferential, measured 5 cm in length, 3 cm in diameter and was 7 cm from the anal verge.  No bleeding was present.   Pathology showed invasive colonic adenocarcinoma, moderately differentiated.  No evidence of mismatch repair deficiency. Chest CT 09/01/2020-no evidence of  metastatic disease MRI pelvis 09/02/2020-tumor at 10.4 cm from anal verge, 5.9 cm from the internal anal sphincter, T3, approximately 15 greater than 5 mm lymph nodes, N2b, multiple uterine fibroids Cycle 1 FOLFOX 09/15/2020 Cycle 2 FOLFOX 09/29/2020 Cycle 3 FOLFOX 10/13/2020 Cycle 4 FOLFOX 10/27/2020 Cycle 5 FOLFOX 11/10/2020 Cycle 6 FOLFOX 11/24/2020 Rectal bleeding, change in bowel habits secondary to #1 Uterine fibroids noted on MRI pelvis 09/02/2020 Port-A-Cath placement 09/09/2020, Interventional Radiology  Disposition: Sharon Bennett appears stable.  She has completed 5 cycles of FOLFOX.  Plan to proceed with cycle 6 today as scheduled.    We reviewed the CBC from today.  Counts are adequate to proceed with treatment.  We discussed the mildly low ANC, 1.6.  The ANC was 1.7 with treatment 2 weeks ago.  We discussed adding white cell growth factor support when the pump is discontinued on 11/26/2020.  We reviewed potential toxicities including bone pain, rash, splenic rupture.  We reviewed neutropenic precautions.  She declines white cell growth factor support with this cycle.  She understands the [place] may be lower when she returns in 2 weeks and the increased risk of infection.  She will contact the office with fever, chills, other signs of infection.  She will consider white cell growth factor support with cycle 7 if needed.  She will return for lab, follow-up, FOLFOX in 2 weeks.  She will contact the office in the interim as outlined above or with any other problems.    [name] ANP/GNP-BC   11/24/2020  10:41 AM

## 2020-11-28 ENCOUNTER — Encounter: Payer: Self-pay | Admitting: *Deleted

## 2020-11-28 NOTE — Progress Notes (Unsigned)
Faxed completed [employer] cancer policy form with office notes and path report to [phone]. Copy to HIM and patient will be provided copy at next appointment

## 2020-11-29 NOTE — Progress Notes (Signed)
The following biosimilar Ziextenzo ([id number]) has been selected for use in this patient per insurance.  Henreitta Leber, PharmD 11/29/20

## 2020-12-04 ENCOUNTER — Other Ambulatory Visit: Payer: Self-pay | Admitting: Oncology

## 2020-12-07 ENCOUNTER — Encounter: Payer: Self-pay | Admitting: *Deleted

## 2020-12-07 NOTE — Progress Notes (Signed)
Faxed last office note, scan, chemo flow sheets with notation of treatment plan. Per [name]: Complete last FOLFOX on 11/17--2 week break--RT/Xeloda for 6 weeks--8 week break---surgery. Surgeon may/may not want repeat CT. Sent to [facility] [phone] att: Clinton Quant

## 2020-12-08 ENCOUNTER — Ambulatory Visit: Payer: 59 | Admitting: Oncology

## 2020-12-08 ENCOUNTER — Ambulatory Visit: Payer: 59

## 2020-12-08 ENCOUNTER — Inpatient Hospital Stay: Payer: 59 | Attending: Oncology

## 2020-12-08 ENCOUNTER — Inpatient Hospital Stay: Payer: 59 | Admitting: Oncology

## 2020-12-08 ENCOUNTER — Inpatient Hospital Stay: Payer: 59

## 2020-12-08 ENCOUNTER — Telehealth: Payer: Self-pay

## 2020-12-08 ENCOUNTER — Other Ambulatory Visit: Payer: 59

## 2020-12-08 ENCOUNTER — Other Ambulatory Visit: Payer: Self-pay

## 2020-12-08 ENCOUNTER — Ambulatory Visit: Payer: 59 | Admitting: Nurse Practitioner

## 2020-12-08 VITALS — HR 97 | Temp 98.4°F | Resp 16 | Wt 162.8 lb

## 2020-12-08 DIAGNOSIS — C20 Malignant neoplasm of rectum: Secondary | ICD-10-CM | POA: Insufficient documentation

## 2020-12-08 DIAGNOSIS — Z5111 Encounter for antineoplastic chemotherapy: Secondary | ICD-10-CM | POA: Diagnosis not present

## 2020-12-08 LAB — CBC WITH DIFFERENTIAL (CANCER CENTER ONLY)
Abs Immature Granulocytes: 0.02 10*3/uL (ref 0.00–0.07)
Basophils Absolute: 0 10*3/uL (ref 0.0–0.1)
Basophils Relative: 0 %
Eosinophils Absolute: 0.1 10*3/uL (ref 0.0–0.5)
Eosinophils Relative: 1 %
HCT: 35.2 % — ABNORMAL LOW (ref 36.0–46.0)
Hemoglobin: 11.2 g/dL — ABNORMAL LOW (ref 12.0–15.0)
Immature Granulocytes: 0 %
Lymphocytes Relative: 51 %
Lymphs Abs: 2.6 10*3/uL (ref 0.7–4.0)
MCH: 27 pg (ref 26.0–34.0)
MCHC: 31.8 g/dL (ref 30.0–36.0)
MCV: 84.8 fL (ref 80.0–100.0)
Monocytes Absolute: 0.9 10*3/uL (ref 0.1–1.0)
Monocytes Relative: 17 %
Neutro Abs: 1.6 10*3/uL — ABNORMAL LOW (ref 1.7–7.7)
Neutrophils Relative %: 31 %
Platelet Count: 168 10*3/uL (ref 150–400)
RBC: 4.15 MIL/uL (ref 3.87–5.11)
RDW: 17.2 % — ABNORMAL HIGH (ref 11.5–15.5)
WBC Count: 5.2 10*3/uL (ref 4.0–10.5)
nRBC: 0 % (ref 0.0–0.2)

## 2020-12-08 LAB — CMP (CANCER CENTER ONLY)
ALT: 36 U/L (ref 0–44)
AST: 26 U/L (ref 15–41)
Albumin: 3.9 g/dL (ref 3.5–5.0)
Alkaline Phosphatase: 62 U/L (ref 38–126)
Anion gap: 7 (ref 5–15)
BUN: 9 mg/dL (ref 6–20)
CO2: 26 mmol/L (ref 22–32)
Calcium: 8.6 mg/dL — ABNORMAL LOW (ref 8.9–10.3)
Chloride: 105 mmol/L (ref 98–111)
Creatinine: 0.61 mg/dL (ref 0.44–1.00)
GFR, Estimated: >60 mL/min (ref >60)
Glucose, Bld: 98 mg/dL (ref 70–99)
Potassium: 3.6 mmol/L (ref 3.5–5.1)
Sodium: 138 mmol/L (ref 135–145)
Total Bilirubin: 0.5 mg/dL (ref 0.3–1.2)
Total Protein: 7 g/dL (ref 6.5–8.1)

## 2020-12-08 MED ORDER — FLUCONAZOLE 100 MG PO TABS: 100.0000 mg | ORAL_TABLET | Freq: Every day | ORAL | 0 refills | Status: DC

## 2020-12-08 MED ORDER — SODIUM CHLORIDE 0.9 % IV SOLN
2400.0000 mg/m2 | INTRAVENOUS | Status: DC
  Administered 2020-12-08: 4450 mg via INTRAVENOUS
  Filled 2020-12-08: qty 89

## 2020-12-08 MED ORDER — DEXTROSE 5 % IV SOLN: Freq: Once | INTRAVENOUS | Status: AC

## 2020-12-08 MED ORDER — FLUOROURACIL CHEMO INJECTION 2.5 GM/50ML
400.0000 mg/m2 | Freq: Once | INTRAVENOUS | Status: AC
  Administered 2020-12-08: 750 mg via INTRAVENOUS
  Filled 2020-12-08: qty 15

## 2020-12-08 MED ORDER — OXALIPLATIN CHEMO INJECTION 100 MG/20ML
85.0000 mg/m2 | Freq: Once | INTRAVENOUS | Status: AC
  Administered 2020-12-08: 160 mg via INTRAVENOUS
  Filled 2020-12-08: qty 32

## 2020-12-08 MED ORDER — DIPHENHYDRAMINE HCL 50 MG/ML IJ SOLN
25.0000 mg | Freq: Once | INTRAMUSCULAR | Status: AC
  Administered 2020-12-08: 25 mg via INTRAVENOUS
  Filled 2020-12-08: qty 1

## 2020-12-08 MED ORDER — PALONOSETRON HCL INJECTION 0.25 MG/5ML
0.2500 mg | Freq: Once | INTRAVENOUS | Status: AC
  Administered 2020-12-08: 0.25 mg via INTRAVENOUS
  Filled 2020-12-08: qty 5

## 2020-12-08 MED ORDER — FAMOTIDINE 20 MG IN NS 100 ML IVPB
20.0000 mg | Freq: Once | INTRAVENOUS | Status: AC
  Administered 2020-12-08: 20 mg via INTRAVENOUS
  Filled 2020-12-08: qty 100

## 2020-12-08 MED ORDER — LEUCOVORIN CALCIUM INJECTION 350 MG
400.0000 mg/m2 | Freq: Once | INTRAVENOUS | Status: AC
  Administered 2020-12-08: 744 mg via INTRAVENOUS
  Filled 2020-12-08: qty 37.2

## 2020-12-08 MED ORDER — SODIUM CHLORIDE 0.9 % IV SOLN
10.0000 mg | Freq: Once | INTRAVENOUS | Status: AC
  Administered 2020-12-08: 10 mg via INTRAVENOUS
  Filled 2020-12-08: qty 1

## 2020-12-08 NOTE — Progress Notes (Signed)
Patient presents for treatment. RN assessment completed along with the following:  Labs/vitals reviewed - Yes, and within treatment parameters.   Weight within 10% of previous measurement - Yes Oncology Treatment Attestation completed for current therapy- Yes, on date 09/05/20 Informed consent completed and reflects current therapy/intent - Yes, on date 09/15/20             Provider progress note reviewed - Yes, today's provider note was reviewed. Treatment/Antibody/Supportive plan reviewed - Yes, and there are no adjustments needed for today's treatment. S&H and other orders reviewed - Yes, and there are no additional orders identified. Previous treatment date reviewed - Yes, and the appropriate amount of time has elapsed between treatments.  Patient to proceed with treatment.

## 2020-12-08 NOTE — Progress Notes (Signed)
  [place] OFFICE PROGRESS NOTE   Diagnosis: Rectal cancer  INTERVAL HISTORY:   Ms. Dower returns as scheduled.  He completed another cycle of FOLFOX on 11/24/2020.  No nausea/vomiting, mouth sores, or diarrhea.  She reports cold sensitivity.  She has malaise.  No peripheral numbness.  She continues working.  Objective:  Vital signs in last 24 hours:  Pulse 97, temperature 98.4 F (36.9 C), temperature source Oral, resp. rate 16, weight 162 lb 12.8 oz (73.8 kg), SpO2 100 %.    HEENT: Thick white coat over the tongue, no buccal thrush or ulcers Resp: Lungs clear bilaterally Cardio: Regular rate and rhythm GI: No hepatosplenomegaly, nontender Vascular: No leg edema Neuro: The vibratory sense is intact at the fingertips bilaterally   Portacath/PICC-without erythema  Lab Results:  Lab Results  Component Value Date   WBC 5.2 12/08/2020   HGB 11.2 (L) 12/08/2020   HCT 35.2 (L) 12/08/2020   MCV 84.8 12/08/2020   PLT 168 12/08/2020   NEUTROABS 1.6 (L) 12/08/2020    CMP  Lab Results  Component Value Date   NA 139 11/24/2020   K 3.7 11/24/2020   CL 105 11/24/2020   CO2 25 11/24/2020   GLUCOSE 102 (H) 11/24/2020   BUN 12 11/24/2020   CREATININE 0.73 11/24/2020   CALCIUM 8.7 (L) 11/24/2020   PROT 7.0 11/24/2020   ALBUMIN 4.1 11/24/2020   AST 17 11/24/2020   ALT 23 11/24/2020   ALKPHOS 71 11/24/2020   BILITOT 0.6 11/24/2020   GFRNONAA >60 11/24/2020    Lab Results  Component Value Date   CEA 1.48 09/15/2020     Medications: I have reviewed the patient's current medications.   Assessment/Plan: Rectal cancer  CT abdomen/pelvis 08/21/2020-large rectal mass measuring 4.4 x 2.6 x 5.5 cm, haziness in the mesorectal fat, obscuration of the fat plane between the mass and the adjacent posterior aspect of the uterus, numerous prominent mesorectal lymph nodes measuring up to 5 mm Colonoscopy 08/23/2020-fungating infiltrative nonobstructing large mass  found in the rectum.  Mass was noncircumferential, measured 5 cm in length, 3 cm in diameter and was 7 cm from the anal verge.  No bleeding was present.   Pathology showed invasive colonic adenocarcinoma, moderately differentiated.  No evidence of mismatch repair deficiency. Chest CT 09/01/2020-no evidence of metastatic disease MRI pelvis 09/02/2020-tumor at 10.4 cm from anal verge, 5.9 cm from the internal anal sphincter, T3, approximately 15 greater than 5 mm lymph nodes, N2b, multiple uterine fibroids Cycle 1 FOLFOX 09/15/2020 Cycle 2 FOLFOX 09/29/2020 Cycle 3 FOLFOX 10/13/2020 Cycle 4 FOLFOX 10/27/2020 Cycle 5 FOLFOX 11/10/2020 Cycle 6 FOLFOX 11/24/2020 Cycle 7 FOLFOX 12/08/2020 Rectal bleeding, change in bowel habits secondary to #1 Uterine fibroids noted on MRI pelvis 09/02/2020 Port-A-Cath placement 09/09/2020, Interventional Radiology    Disposition: Ms. Hamidi appears stable she continues to tolerate the chemotherapy well.  She will complete cycle 7 FOLFOX today.  She has stable mild neutropenia.  She will return for an office visit and chemotherapy in 2 weeks.  We will schedule radiation oncology follow-up when she is here in 2 weeks. [name], MD  12/08/2020  9:29 AM

## 2020-12-08 NOTE — Patient Instructions (Addendum)
[facility]  Discharge Instructions: Thank you for choosing [facility] to provide your oncology and hematology care.   If you have a lab appointment with the [facility], please go directly to the [facility] and check in at the registration area.   Wear comfortable clothing and clothing appropriate for easy access to any Portacath or PICC line.   We strive to give you quality time with your provider. You may need to reschedule your appointment if you arrive late (15 or more minutes).  Arriving late affects you and other patients whose appointments are after yours.  Also, if you miss three or more appointments without notifying the office, you may be dismissed from the clinic at the provider's discretion.      For prescription refill requests, have your pharmacy contact our office and allow 72 hours for refills to be completed.    Today you received the following chemotherapy and/or immunotherapy agents oxaliplatin, leucovorin, fluorouracil      To help prevent nausea and vomiting after your treatment, we encourage you to take your nausea medication as directed.  BELOW ARE SYMPTOMS THAT SHOULD BE REPORTED IMMEDIATELY: *FEVER GREATER THAN 100.4 F (38 C) OR HIGHER *CHILLS OR SWEATING *NAUSEA AND VOMITING THAT IS NOT CONTROLLED WITH YOUR NAUSEA MEDICATION *UNUSUAL SHORTNESS OF BREATH *UNUSUAL BRUISING OR BLEEDING *URINARY PROBLEMS (pain or burning when urinating, or frequent urination) *BOWEL PROBLEMS (unusual diarrhea, constipation, pain near the anus) TENDERNESS IN MOUTH AND THROAT WITH OR WITHOUT PRESENCE OF ULCERS (sore throat, sores in mouth, or a toothache) UNUSUAL RASH, SWELLING OR PAIN  UNUSUAL VAGINAL DISCHARGE OR ITCHING   Items with * indicate a potential emergency and should be followed up as soon as possible or go to the Emergency Department if any problems should occur.  Please show the CHEMOTHERAPY ALERT CARD or IMMUNOTHERAPY  ALERT CARD at check-in to the Emergency Department and triage nurse.  Should you have questions after your visit or need to cancel or reschedule your appointment, please contact [facility]  Dept: [phone]  and follow the prompts.  Office hours are 8:00 a.m. to 4:30 p.m. Monday - Friday. Please note that voicemails left after 4:00 p.m. may not be returned until the following business day.  We are closed weekends and major holidays. You have access to a nurse at all times for urgent questions. Please call the main number to the clinic Dept: [phone] and follow the prompts.   For any non-urgent questions, you may also contact your provider using MyChart. We now offer e-Visits for anyone 41 and older to request care online for non-urgent symptoms. For details visit [email or web address].   Also download the MyChart app! Go to the app store, search "MyChart", open the app, select [facility], and log in with your MyChart username and password.  Due to Covid, a mask is required upon entering the hospital/clinic. If you do not have a mask, one will be given to you upon arrival. For doctor visits, patients may have 1 support person aged 41 or older with them. For treatment visits, patients cannot have anyone with them due to current Covid guidelines and our immunocompromised population.   Oxaliplatin Injection What is this medication? OXALIPLATIN (ox AL i PLA tin) is a chemotherapy drug. It targets fast dividing cells, like cancer cells, and causes these cells to die. This medicine is used to treat cancers of the colon and rectum, and many other cancers. This  medicine may be used for other purposes; ask your health care provider or pharmacist if you have questions. COMMON BRAND NAME(S): Eloxatin What should I tell my care team before I take this medication? They need to know if you have any of these conditions: heart disease history of irregular heartbeat liver  disease low blood counts, like white cells, platelets, or red blood cells lung or breathing disease, like asthma take medicines that treat or prevent blood clots tingling of the fingers or toes, or other nerve disorder an unusual or allergic reaction to oxaliplatin, other chemotherapy, other medicines, foods, dyes, or preservatives pregnant or trying to get pregnant breast-feeding How should I use this medication? This drug is given as an infusion into a vein. It is administered in a hospital or clinic by a specially trained health care professional. Talk to your pediatrician regarding the use of this medicine in children. Special care may be needed. Overdosage: If you think you have taken too much of this medicine contact a poison control center or emergency room at once. NOTE: This medicine is only for you. Do not share this medicine with others. What if I miss a dose? It is important not to miss a dose. Call your doctor or health care professional if you are unable to keep an appointment. What may interact with this medication? Do not take this medicine with any of the following medications: cisapride dronedarone pimozide thioridazine This medicine may also interact with the following medications: aspirin and aspirin-like medicines certain medicines that treat or prevent blood clots like warfarin, apixaban, dabigatran, and rivaroxaban cisplatin cyclosporine diuretics medicines for infection like acyclovir, adefovir, amphotericin B, bacitracin, cidofovir, foscarnet, ganciclovir, gentamicin, pentamidine, vancomycin NSAIDs, medicines for pain and inflammation, like ibuprofen or naproxen other medicines that prolong the QT interval (an abnormal heart rhythm) pamidronate zoledronic acid This list may not describe all possible interactions. Give your health care provider a list of all the medicines, herbs, non-prescription drugs, or dietary supplements you use. Also tell them if you  smoke, drink alcohol, or use illegal drugs. Some items may interact with your medicine. What should I watch for while using this medication? Your condition will be monitored carefully while you are receiving this medicine. You may need blood work done while you are taking this medicine. This medicine may make you feel generally unwell. This is not uncommon as chemotherapy can affect healthy cells as well as cancer cells. Report any side effects. Continue your course of treatment even though you feel ill unless your healthcare professional tells you to stop. This medicine can make you more sensitive to cold. Do not drink cold drinks or use ice. Cover exposed skin before coming in contact with cold temperatures or cold objects. When out in cold weather wear warm clothing and cover your mouth and nose to warm the air that goes into your lungs. Tell your doctor if you get sensitive to the cold. Do not become pregnant while taking this medicine or for 9 months after stopping it. Women should inform their health care professional if they wish to become pregnant or think they might be pregnant. Men should not father a child while taking this medicine and for 6 months after stopping it. There is potential for serious side effects to an unborn child. Talk to your health care professional for more information. Do not breast-feed a child while taking this medicine or for 3 months after stopping it. This medicine has caused ovarian failure in some women. This medicine  may make it more difficult to get pregnant. Talk to your health care professional if you are concerned about your fertility. This medicine has caused decreased sperm counts in some men. This may make it more difficult to father a child. Talk to your health care professional if you are concerned about your fertility. This medicine may increase your risk of getting an infection. Call your health care professional for advice if you get a fever, chills, or  sore throat, or other symptoms of a cold or flu. Do not treat yourself. Try to avoid being around people who are sick. Avoid taking medicines that contain aspirin, acetaminophen, ibuprofen, naproxen, or ketoprofen unless instructed by your health care professional. These medicines may hide a fever. Be careful brushing or flossing your teeth or using a toothpick because you may get an infection or bleed more easily. If you have any dental work done, tell your dentist you are receiving this medicine. What side effects may I notice from receiving this medication? Side effects that you should report to your doctor or health care professional as soon as possible: allergic reactions like skin rash, itching or hives, swelling of the face, lips, or tongue breathing problems cough low blood counts - this medicine may decrease the number of white blood cells, red blood cells, and platelets. You may be at increased risk for infections and bleeding nausea, vomiting pain, redness, or irritation at site where injected pain, tingling, numbness in the hands or feet signs and symptoms of bleeding such as bloody or black, tarry stools; red or dark brown urine; spitting up blood or brown material that looks like coffee grounds; red spots on the skin; unusual bruising or bleeding from the eyes, gums, or nose signs and symptoms of a dangerous change in heartbeat or heart rhythm like chest pain; dizziness; fast, irregular heartbeat; palpitations; feeling faint or lightheaded; falls signs and symptoms of infection like fever; chills; cough; sore throat; pain or trouble passing urine signs and symptoms of liver injury like dark yellow or brown urine; general ill feeling or flu-like symptoms; light-colored stools; loss of appetite; nausea; right upper belly pain; unusually weak or tired; yellowing of the eyes or skin signs and symptoms of low red blood cells or anemia such as unusually weak or tired; feeling faint or  lightheaded; falls signs and symptoms of muscle injury like dark urine; trouble passing urine or change in the amount of urine; unusually weak or tired; muscle pain; back pain Side effects that usually do not require medical attention (report to your doctor or health care professional if they continue or are bothersome): changes in taste diarrhea gas hair loss loss of appetite mouth sores This list may not describe all possible side effects. Call your doctor for medical advice about side effects. You may report side effects to FDA at 1-800-FDA-1088. Where should I keep my medication? This drug is given in a hospital or clinic and will not be stored at home. NOTE: This sheet is a summary. It may not cover all possible information. If you have questions about this medicine, talk to your doctor, pharmacist, or health care provider.  2022 Elsevier/Gold Standard (2018-06-11 12:20:35)  Leucovorin injection What is this medication? LEUCOVORIN (loo koe VOR in) is used to prevent or treat the harmful effects of some medicines. This medicine is used to treat anemia caused by a low amount of folic acid in the body. It is also used with 5-fluorouracil (5-FU) to treat colon cancer. This medicine may  be used for other purposes; ask your health care provider or pharmacist if you have questions. What should I tell my care team before I take this medication? They need to know if you have any of these conditions: anemia from low levels of vitamin B-12 in the blood an unusual or allergic reaction to leucovorin, folic acid, other medicines, foods, dyes, or preservatives pregnant or trying to get pregnant breast-feeding How should I use this medication? This medicine is for injection into a muscle or into a vein. It is given by a health care professional in a hospital or clinic setting. Talk to your pediatrician regarding the use of this medicine in children. Special care may be needed. Overdosage: If you  think you have taken too much of this medicine contact a poison control center or emergency room at once. NOTE: This medicine is only for you. Do not share this medicine with others. What if I miss a dose? This does not apply. What may interact with this medication? capecitabine fluorouracil phenobarbital phenytoin primidone trimethoprim-sulfamethoxazole This list may not describe all possible interactions. Give your health care provider a list of all the medicines, herbs, non-prescription drugs, or dietary supplements you use. Also tell them if you smoke, drink alcohol, or use illegal drugs. Some items may interact with your medicine. What should I watch for while using this medication? Your condition will be monitored carefully while you are receiving this medicine. This medicine may increase the side effects of 5-fluorouracil, 5-FU. Tell your doctor or health care professional if you have diarrhea or mouth sores that do not get better or that get worse. What side effects may I notice from receiving this medication? Side effects that you should report to your doctor or health care professional as soon as possible: allergic reactions like skin rash, itching or hives, swelling of the face, lips, or tongue breathing problems fever, infection mouth sores unusual bleeding or bruising unusually weak or tired Side effects that usually do not require medical attention (report to your doctor or health care professional if they continue or are bothersome): constipation or diarrhea loss of appetite nausea, vomiting This list may not describe all possible side effects. Call your doctor for medical advice about side effects. You may report side effects to FDA at 1-800-FDA-1088. Where should I keep my medication? This drug is given in a hospital or clinic and will not be stored at home. NOTE: This sheet is a summary. It may not cover all possible information. If you have questions about this  medicine, talk to your doctor, pharmacist, or health care provider.  2022 Elsevier/Gold Standard (2007-07-29 16:50:29)  Fluorouracil, 5-FU injection What is this medication? FLUOROURACIL, 5-FU (flure oh YOOR a sil) is a chemotherapy drug. It slows the growth of cancer cells. This medicine is used to treat many types of cancer like breast cancer, colon or rectal cancer, pancreatic cancer, and stomach cancer. This medicine may be used for other purposes; ask your health care provider or pharmacist if you have questions. COMMON BRAND NAME(S): Adrucil What should I tell my care team before I take this medication? They need to know if you have any of these conditions: blood disorders dihydropyrimidine dehydrogenase (DPD) deficiency infection (especially a virus infection such as chickenpox, cold sores, or herpes) kidney disease liver disease malnourished, poor nutrition recent or ongoing radiation therapy an unusual or allergic reaction to fluorouracil, other chemotherapy, other medicines, foods, dyes, or preservatives pregnant or trying to get pregnant breast-feeding How should I  use this medication? This drug is given as an infusion or injection into a vein. It is administered in a hospital or clinic by a specially trained health care professional. Talk to your pediatrician regarding the use of this medicine in children. Special care may be needed. Overdosage: If you think you have taken too much of this medicine contact a poison control center or emergency room at once. NOTE: This medicine is only for you. Do not share this medicine with others. What if I miss a dose? It is important not to miss your dose. Call your doctor or health care professional if you are unable to keep an appointment. What may interact with this medication? Do not take this medicine with any of the following medications: live virus vaccines This medicine may also interact with the following medications: medicines  that treat or prevent blood clots like warfarin, enoxaparin, and dalteparin This list may not describe all possible interactions. Give your health care provider a list of all the medicines, herbs, non-prescription drugs, or dietary supplements you use. Also tell them if you smoke, drink alcohol, or use illegal drugs. Some items may interact with your medicine. What should I watch for while using this medication? Visit your doctor for checks on your progress. This drug may make you feel generally unwell. This is not uncommon, as chemotherapy can affect healthy cells as well as cancer cells. Report any side effects. Continue your course of treatment even though you feel ill unless your doctor tells you to stop. In some cases, you may be given additional medicines to help with side effects. Follow all directions for their use. Call your doctor or health care professional for advice if you get a fever, chills or sore throat, or other symptoms of a cold or flu. Do not treat yourself. This drug decreases your body's ability to fight infections. Try to avoid being around people who are sick. This medicine may increase your risk to bruise or bleed. Call your doctor or health care professional if you notice any unusual bleeding. Be careful brushing and flossing your teeth or using a toothpick because you may get an infection or bleed more easily. If you have any dental work done, tell your dentist you are receiving this medicine. Avoid taking products that contain aspirin, acetaminophen, ibuprofen, naproxen, or ketoprofen unless instructed by your doctor. These medicines may hide a fever. Do not become pregnant while taking this medicine. Women should inform their doctor if they wish to become pregnant or think they might be pregnant. There is a potential for serious side effects to an unborn child. Talk to your health care professional or pharmacist for more information. Do not breast-feed an infant while taking  this medicine. Men should inform their doctor if they wish to father a child. This medicine may lower sperm counts. Do not treat diarrhea with over the counter products. Contact your doctor if you have diarrhea that lasts more than 2 days or if it is severe and watery. This medicine can make you more sensitive to the sun. Keep out of the sun. If you cannot avoid being in the sun, wear protective clothing and use sunscreen. Do not use sun lamps or tanning beds/booths. What side effects may I notice from receiving this medication? Side effects that you should report to your doctor or health care professional as soon as possible: allergic reactions like skin rash, itching or hives, swelling of the face, lips, or tongue low blood counts - this medicine  may decrease the number of white blood cells, red blood cells and platelets. You may be at increased risk for infections and bleeding. signs of infection - fever or chills, cough, sore throat, pain or difficulty passing urine signs of decreased platelets or bleeding - bruising, pinpoint red spots on the skin, black, tarry stools, blood in the urine signs of decreased red blood cells - unusually weak or tired, fainting spells, lightheadedness breathing problems changes in vision chest pain mouth sores nausea and vomiting pain, swelling, redness at site where injected pain, tingling, numbness in the hands or feet redness, swelling, or sores on hands or feet stomach pain unusual bleeding Side effects that usually do not require medical attention (report to your doctor or health care professional if they continue or are bothersome): changes in finger or toe nails diarrhea dry or itchy skin hair loss headache loss of appetite sensitivity of eyes to the light stomach upset unusually teary eyes This list may not describe all possible side effects. Call your doctor for medical advice about side effects. You may report side effects to FDA at  1-800-FDA-1088. Where should I keep my medication? This drug is given in a hospital or clinic and will not be stored at home. NOTE: This sheet is a summary. It may not cover all possible information. If you have questions about this medicine, talk to your doctor, pharmacist, or health care provider.  2022 Elsevier/Gold Standard (2018-12-23 15:00:03)  The chemotherapy medication bag should finish at 46 hours, 96 hours, or 7 days. For example, if your pump is scheduled for 46 hours and it was put on at 4:00 p.m., it should finish at 2:00 p.m. the day it is scheduled to come off regardless of your appointment time.     Estimated time to finish at 12:30pm Saturday December 10, 2020.   If the display on your pump reads "Low Volume" and it is beeping, take the batteries out of the pump and come to the cancer center for it to be taken off.   If the pump alarms go off prior to the pump reading "Low Volume" then call [phone] and someone can assist you.  If the plunger comes out and the chemotherapy medication is leaking out, please use your home chemo spill kit to clean up the spill. Do NOT use paper towels or other household products.  If you have problems or questions regarding your pump, please call either [phone] (24 hours a day) or the cancer center Monday-Friday 8:00 a.m.- 4:30 p.m. at the clinic number and we will assist you. If you are unable to get assistance, then go to the nearest Emergency Department and ask the staff to contact the IV team for assistance.

## 2020-12-08 NOTE — Telephone Encounter (Signed)
Patient seen by Dr. Sherrill today ? ?Vitals are within treatment parameters. ? ?Labs reviewed by Dr. Sherrill and are within treatment parameters. ? ?Per physician team, patient is ready for treatment and there are NO modifications to the treatment plan.  ?

## 2020-12-13 ENCOUNTER — Telehealth: Payer: Self-pay | Admitting: *Deleted

## 2020-12-13 ENCOUNTER — Emergency Department (HOSPITAL_BASED_OUTPATIENT_CLINIC_OR_DEPARTMENT_OTHER): Payer: 59

## 2020-12-13 ENCOUNTER — Encounter (HOSPITAL_BASED_OUTPATIENT_CLINIC_OR_DEPARTMENT_OTHER): Payer: Self-pay | Admitting: Emergency Medicine

## 2020-12-13 ENCOUNTER — Emergency Department (HOSPITAL_BASED_OUTPATIENT_CLINIC_OR_DEPARTMENT_OTHER)
Admission: EM | Admit: 2020-12-13 | Discharge: 2020-12-13 | Disposition: A | Discharge status: Home / Self Care | Payer: 59 | Source: Home / Self Care | Attending: Emergency Medicine | Admitting: Emergency Medicine

## 2020-12-13 ENCOUNTER — Other Ambulatory Visit: Payer: Self-pay

## 2020-12-13 DIAGNOSIS — Z800 Family history of malignant neoplasm of digestive organs: Secondary | ICD-10-CM | POA: Diagnosis not present

## 2020-12-13 DIAGNOSIS — R000 Tachycardia, unspecified: Secondary | ICD-10-CM | POA: Insufficient documentation

## 2020-12-13 DIAGNOSIS — K625 Hemorrhage of anus and rectum: Secondary | ICD-10-CM | POA: Diagnosis not present

## 2020-12-13 DIAGNOSIS — B9620 Unspecified Escherichia coli [E. coli] as the cause of diseases classified elsewhere: Secondary | ICD-10-CM | POA: Diagnosis not present

## 2020-12-13 DIAGNOSIS — Z85038 Personal history of other malignant neoplasm of large intestine: Secondary | ICD-10-CM | POA: Insufficient documentation

## 2020-12-13 DIAGNOSIS — Z885 Allergy status to narcotic agent status: Secondary | ICD-10-CM | POA: Diagnosis not present

## 2020-12-13 DIAGNOSIS — T451X5A Adverse effect of antineoplastic and immunosuppressive drugs, initial encounter: Secondary | ICD-10-CM | POA: Diagnosis present

## 2020-12-13 DIAGNOSIS — R7989 Other specified abnormal findings of blood chemistry: Secondary | ICD-10-CM | POA: Diagnosis not present

## 2020-12-13 DIAGNOSIS — R5081 Fever presenting with conditions classified elsewhere: Secondary | ICD-10-CM | POA: Diagnosis present

## 2020-12-13 DIAGNOSIS — K219 Gastro-esophageal reflux disease without esophagitis: Secondary | ICD-10-CM | POA: Diagnosis not present

## 2020-12-13 DIAGNOSIS — Z85048 Personal history of other malignant neoplasm of rectum, rectosigmoid junction, and anus: Secondary | ICD-10-CM | POA: Insufficient documentation

## 2020-12-13 DIAGNOSIS — R7881 Bacteremia: Secondary | ICD-10-CM | POA: Diagnosis not present

## 2020-12-13 DIAGNOSIS — R509 Fever, unspecified: Secondary | ICD-10-CM | POA: Insufficient documentation

## 2020-12-13 DIAGNOSIS — Z95828 Presence of other vascular implants and grafts: Secondary | ICD-10-CM | POA: Diagnosis not present

## 2020-12-13 DIAGNOSIS — Z888 Allergy status to other drugs, medicaments and biological substances status: Secondary | ICD-10-CM | POA: Diagnosis not present

## 2020-12-13 DIAGNOSIS — Z9221 Personal history of antineoplastic chemotherapy: Secondary | ICD-10-CM | POA: Diagnosis not present

## 2020-12-13 DIAGNOSIS — A4151 Sepsis due to Escherichia coli [E. coli]: Secondary | ICD-10-CM | POA: Diagnosis not present

## 2020-12-13 DIAGNOSIS — Z803 Family history of malignant neoplasm of breast: Secondary | ICD-10-CM | POA: Diagnosis not present

## 2020-12-13 DIAGNOSIS — Z20822 Contact with and (suspected) exposure to covid-19: Secondary | ICD-10-CM | POA: Insufficient documentation

## 2020-12-13 DIAGNOSIS — Z8249 Family history of ischemic heart disease and other diseases of the circulatory system: Secondary | ICD-10-CM | POA: Diagnosis not present

## 2020-12-13 DIAGNOSIS — C20 Malignant neoplasm of rectum: Secondary | ICD-10-CM | POA: Diagnosis not present

## 2020-12-13 DIAGNOSIS — D709 Neutropenia, unspecified: Secondary | ICD-10-CM | POA: Diagnosis not present

## 2020-12-13 DIAGNOSIS — R5383 Other fatigue: Secondary | ICD-10-CM | POA: Insufficient documentation

## 2020-12-13 DIAGNOSIS — D259 Leiomyoma of uterus, unspecified: Secondary | ICD-10-CM | POA: Diagnosis not present

## 2020-12-13 DIAGNOSIS — A4150 Gram-negative sepsis, unspecified: Secondary | ICD-10-CM | POA: Diagnosis present

## 2020-12-13 DIAGNOSIS — R7401 Elevation of levels of liver transaminase levels: Secondary | ICD-10-CM | POA: Diagnosis present

## 2020-12-13 DIAGNOSIS — Z808 Family history of malignant neoplasm of other organs or systems: Secondary | ICD-10-CM | POA: Diagnosis not present

## 2020-12-13 HISTORY — DX: Malignant neoplasm of rectosigmoid junction: C19

## 2020-12-13 LAB — COMPREHENSIVE METABOLIC PANEL
ALT: 63 U/L — ABNORMAL HIGH (ref 0–44)
AST: 47 U/L — ABNORMAL HIGH (ref 15–41)
Albumin: 4.4 g/dL (ref 3.5–5.0)
Alkaline Phosphatase: 84 U/L (ref 38–126)
Anion gap: 10 (ref 5–15)
BUN: 11 mg/dL (ref 6–20)
CO2: 23 mmol/L (ref 22–32)
Calcium: 8.7 mg/dL — ABNORMAL LOW (ref 8.9–10.3)
Chloride: 100 mmol/L (ref 98–111)
Creatinine, Ser: 0.72 mg/dL (ref 0.44–1.00)
GFR, Estimated: >60 mL/min (ref >60)
Glucose, Bld: 100 mg/dL — ABNORMAL HIGH (ref 70–99)
Potassium: 3.8 mmol/L (ref 3.5–5.1)
Sodium: 133 mmol/L — ABNORMAL LOW (ref 135–145)
Total Bilirubin: 0.7 mg/dL (ref 0.3–1.2)
Total Protein: 7.8 g/dL (ref 6.5–8.1)

## 2020-12-13 LAB — CBC WITH DIFFERENTIAL/PLATELET
Abs Immature Granulocytes: 0.02 10*3/uL (ref 0.00–0.07)
Basophils Absolute: 0 10*3/uL (ref 0.0–0.1)
Basophils Relative: 1 %
Eosinophils Absolute: 0 10*3/uL (ref 0.0–0.5)
Eosinophils Relative: 0 %
HCT: 39.1 % (ref 36.0–46.0)
Hemoglobin: 12.6 g/dL (ref 12.0–15.0)
Immature Granulocytes: 1 %
Lymphocytes Relative: 53 %
Lymphs Abs: 0.9 10*3/uL (ref 0.7–4.0)
MCH: 27 pg (ref 26.0–34.0)
MCHC: 32.2 g/dL (ref 30.0–36.0)
MCV: 83.9 fL (ref 80.0–100.0)
Monocytes Absolute: 0.1 10*3/uL (ref 0.1–1.0)
Monocytes Relative: 4 %
Neutro Abs: 0.7 10*3/uL — ABNORMAL LOW (ref 1.7–7.7)
Neutrophils Relative %: 41 %
Platelets: 125 10*3/uL — ABNORMAL LOW (ref 150–400)
RBC: 4.66 MIL/uL (ref 3.87–5.11)
RDW: 16.4 % — ABNORMAL HIGH (ref 11.5–15.5)
WBC: 1.7 10*3/uL — ABNORMAL LOW (ref 4.0–10.5)
nRBC: 0 % (ref 0.0–0.2)

## 2020-12-13 LAB — URINALYSIS, ROUTINE W REFLEX MICROSCOPIC
Bilirubin Urine: NEGATIVE
Glucose, UA: NEGATIVE mg/dL
Hgb urine dipstick: NEGATIVE
Ketones, ur: NEGATIVE mg/dL
Leukocytes,Ua: NEGATIVE
Nitrite: NEGATIVE
Protein, ur: 100 mg/dL — AB
Specific Gravity, Urine: 1.029 (ref 1.005–1.030)
pH: 7 (ref 5.0–8.0)

## 2020-12-13 LAB — LACTIC ACID, PLASMA
Lactic Acid, Venous: 1.2 mmol/L (ref 0.5–1.9)
Lactic Acid, Venous: 1.5 mmol/L (ref 0.5–1.9)

## 2020-12-13 LAB — RESP PANEL BY RT-PCR (FLU A&B, COVID) ARPGX2
Influenza A by PCR: NEGATIVE
Influenza B by PCR: NEGATIVE
SARS Coronavirus 2 by RT PCR: NEGATIVE

## 2020-12-13 IMAGING — DX DG CHEST 1V PORT
1 series · 1 of 1 positions shown · non-contrast
Comparison: None.

CLINICAL DATA: Fever, on chemotherapy

EXAM:
PORTABLE CHEST 1 VIEW

[chest]
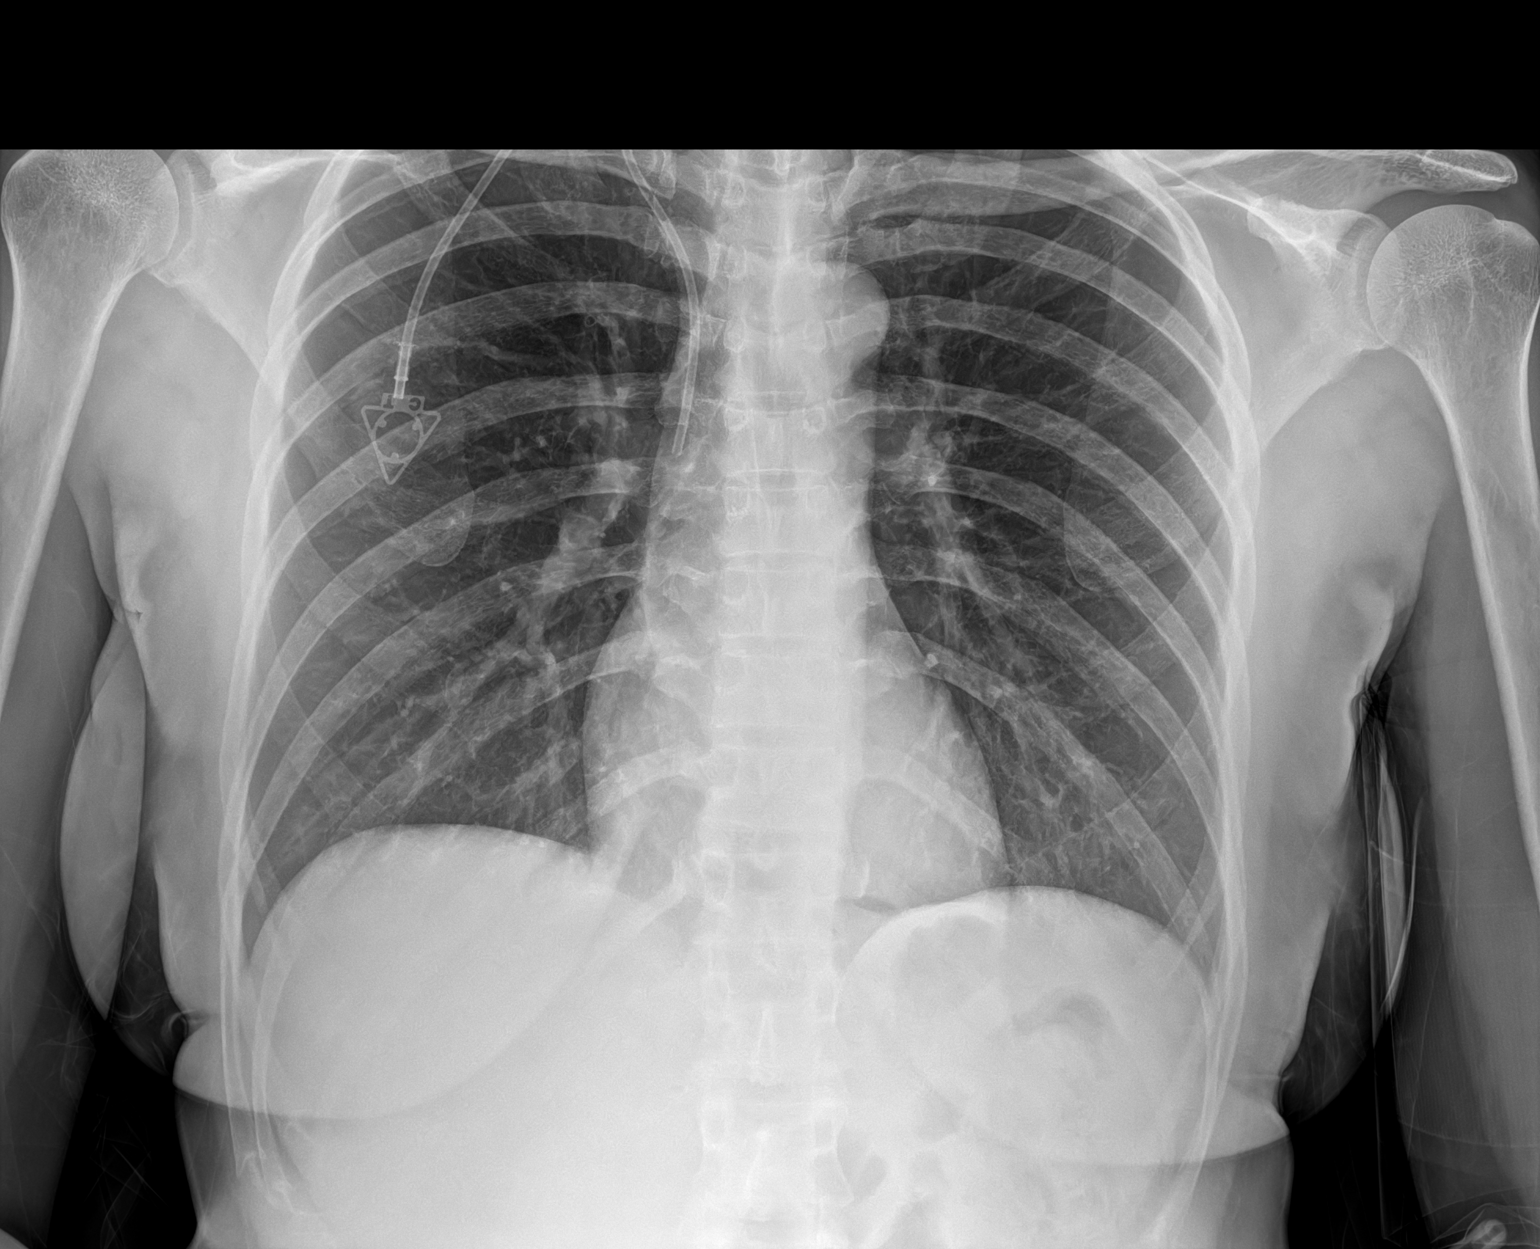

[1 of 1 positions shown; findings below may reference images not displayed]

FINDINGS: The heart size and mediastinal contours are within normal limits.
Both lungs are clear. No pleural effusion. Right chest wall port
catheter tip overlies SVC the visualized skeletal structures are
unremarkable.
IMPRESSION: No acute process in the chest.

## 2020-12-13 MED ORDER — SODIUM CHLORIDE 0.9 % IV BOLUS
1000.0000 mL | Freq: Once | INTRAVENOUS | Status: AC
  Administered 2020-12-13: 1000 mL via INTRAVENOUS

## 2020-12-13 MED ORDER — LEVOFLOXACIN 500 MG PO TABS: 500.0000 mg | ORAL_TABLET | Freq: Every day | ORAL | 0 refills | Status: DC

## 2020-12-13 MED ORDER — LEVOFLOXACIN 500 MG PO TABS
500.0000 mg | ORAL_TABLET | Freq: Once | ORAL | Status: AC
  Administered 2020-12-13: 500 mg via ORAL
  Filled 2020-12-13: qty 1

## 2020-12-13 MED ORDER — ACETAMINOPHEN 325 MG PO TABS
650.0000 mg | ORAL_TABLET | Freq: Once | ORAL | Status: AC
  Administered 2020-12-13: 650 mg via ORAL
  Filled 2020-12-13: qty 2

## 2020-12-13 MED ORDER — HEPARIN SOD (PORK) LOCK FLUSH 100 UNIT/ML IV SOLN
500.0000 [IU] | Freq: Once | INTRAVENOUS | Status: AC
  Administered 2020-12-13: 500 [IU]
  Filled 2020-12-13: qty 5

## 2020-12-13 NOTE — Telephone Encounter (Signed)
Retrieved VM at 0907 that was left this am at 0806 by patient as she was pulling up to ER at direction of [facility]. Reports fever last night of 101.4 that initially responded to Tylenol, but returned during night. This am temp is 100.4. She is going to ER and asking if there is anything else she should do. Upon message receipt, she was already in the emergency room being worked up. Called patient and informed her she made the right decision. ER can get her worked up sooner and administer IV antibiotic as well, that our office does not have in stock.

## 2020-12-13 NOTE — Discharge Instructions (Signed)
We discharged you on antibiotics at the recommendation of your oncologist.  Your next dose of Levaquin is due tomorrow.  You will take this once daily for the next 6 days.  Please follow-up with your oncologist.    We do not see any obvious cause or source of bacterial infection on your work-up today.  However your blood cultures will take 2 to 3 days for results to come back.  If you do test positive for bacteria in your blood, you will get a phone call telling you to come back immediately to the ER for IV antibiotics

## 2020-12-13 NOTE — ED Triage Notes (Signed)
Pt arrives to ED with c/o of fever and fatigue. Pt reports she has colorectal cancer and had her chemo treatment yesterday (7/8). She reports last night she developed extreme fatigue and fever. Her highest temp at home was 101.59F.

## 2020-12-13 NOTE — ED Provider Notes (Signed)
Hx of cancer, on active chemo Reporting fever last night 104F at home  Pending infection eval here Neutropenic Lactate wnl Xray without focal findings Covid/flu negative UA without sign of infection Blood cx sent  Dr Glynda Jaeger spoke to oncologist who advised if no focal findings of infection, pt could be discharged on levaquin 500 mg for 1 week and outpatient f/u  Onc Dr [name]  Pending fluids and reassessment of VS    Physical Exam  BP 100/74   Pulse 85   Temp 99.4 F (37.4 C)   Resp 17   SpO2 99%     MDM  Patient is feeling much better after fluids.  Repeat assessment heart rate showed improvement of her tachycardia and stable blood pressure.  She no longer is feeling lightheaded.  She was given her first dose of antibiotic today and is advised to follow-up with her oncologist.  Discharge instructions reviewed her work-up and return precautions, including if blood cultures return positive.      Wyvonnia Dusky, MD 12/13/20 [phone]

## 2020-12-13 NOTE — ED Provider Notes (Signed)
[facility] EMERGENCY DEPT Provider Note   CSN: [id number] Arrival date & time: 12/13/20  0809     History Chief Complaint  Patient presents with   Fever    Sharon Bennett is a 40 y.o. female.  The history is provided by the patient and medical records. No language interpreter was used.  Fever Max temp prior to arrival:  101.4 Temp source:  Oral Severity:  Severe Onset quality:  Gradual Duration:  2 days Progression:  Waxing and waning Chronicity:  New Relieved by:  Acetaminophen Worsened by:  Nothing Ineffective treatments:  None tried Associated symptoms: chills   Associated symptoms: no chest pain, no confusion, no congestion, no cough, no diarrhea, no dysuria, no ear pain, no headaches, no myalgias, no nausea, no rash, no rhinorrhea, no somnolence, no sore throat and no vomiting   Risk factors: hx of cancer (on chemo)       Past Medical History:  Diagnosis Date   Colorectal cancer ([place])    Family history of brain cancer    Family history of breast cancer    Family history of pancreatic cancer     Patient Active Problem List   Diagnosis Date Noted   Genetic testing 10/06/2020   Uterine fibroid 09/22/2020   Family history of pancreatic cancer 09/09/2020   Family history of brain cancer 09/09/2020   Family history of breast cancer 09/09/2020   Rectal cancer ([place]) 09/05/2020    Past Surgical History:  Procedure Laterality Date   IR IMAGING GUIDED PORT INSERTION  09/09/2020     OB History   No obstetric history on file.     Family History  Problem Relation Age of Onset   Hypertension Mother    Heart failure Father    Hypertension Father    Pancreatic cancer Maternal Grandfather        dx 49s   Brain cancer Paternal Grandmother        dx >50   Breast cancer Other 2       MGF's sister    Social History   Tobacco Use   Smoking status: Never   Smokeless tobacco: Never  Vaping Use   Vaping Use: Never used    Home  Medications Prior to Admission medications   Medication Sig Start Date End Date Taking? Authorizing Provider  fluconazole (DIFLUCAN) 100 MG tablet Take 1 tablet (100 mg total) by mouth daily. 12/08/20   Ladell Pier, MD  lidocaine-prilocaine (EMLA) cream Apply 1 application topically as needed. Apply 1/2 tablespoon to port site and cover with plastic wrap 2 hours prior to stick to numb site Patient not taking: No sig reported 09/23/20   Ladell Pier, MD  melatonin 3 MG TABS tablet Take 3 mg by mouth at bedtime. Patient not taking: Reported on 12/08/2020    [provider]  ondansetron (ZOFRAN) 8 MG tablet Take 1 tablet (8 mg total) by mouth every 8 (eight) hours as needed for nausea or vomiting. Patient not taking: No sig reported 09/12/20   Ladell Pier, MD  pantoprazole (PROTONIX) 40 MG tablet TAKE 1 TABLET BY MOUTH EVERY DAY 10/27/20   Ladell Pier, MD  prochlorperazine (COMPAZINE) 10 MG tablet Take 1 tablet (10 mg total) by mouth every 6 (six) hours as needed for nausea or vomiting. 09/12/20   Ladell Pier, MD  traMADol (ULTRAM) 50 MG tablet Take 1 tablet (50 mg total) by mouth every 6 (six) hours as needed for moderate pain.  Patient not taking: No sig reported 09/29/20   Ladell Pier, MD    Allergies    Codeine and Tegaderm ag mesh [silver]  Review of Systems   Review of Systems  Constitutional:  Positive for chills, fatigue and fever. Negative for diaphoresis.  HENT:  Negative for congestion, ear pain, rhinorrhea and sore throat.   Respiratory:  Negative for cough, chest tightness, shortness of breath and wheezing.   Cardiovascular:  Negative for chest pain, palpitations and leg swelling.  Gastrointestinal:  Negative for abdominal pain (no more pain than normal pain for her), constipation, diarrhea, nausea and vomiting.  Genitourinary:  Negative for dysuria, flank pain and frequency.  Musculoskeletal:  Negative for back pain, myalgias, neck pain and neck  stiffness.  Skin:  Negative for rash and wound.  Neurological:  Negative for dizziness, weakness, light-headedness, numbness and headaches.  Psychiatric/Behavioral:  Negative for agitation and confusion.   All other systems reviewed and are negative.  Physical Exam Updated Vital Signs BP (!) 126/103 (BP Location: Right Arm)   Pulse (!) 136   Temp 99.2 F (37.3 C)   Resp 18   SpO2 100%   Physical Exam Vitals and nursing note reviewed.  Constitutional:      General: She is not in acute distress.    Appearance: She is well-developed. She is not ill-appearing, toxic-appearing or diaphoretic.  HENT:     Head: Normocephalic and atraumatic.     Nose: Nose normal. No congestion.     Mouth/Throat:     Mouth: Mucous membranes are moist.     Pharynx: No oropharyngeal exudate or posterior oropharyngeal erythema.  Eyes:     Extraocular Movements: Extraocular movements intact.     Conjunctiva/sclera: Conjunctivae normal.     Pupils: Pupils are equal, round, and reactive to light.  Cardiovascular:     Rate and Rhythm: Regular rhythm. Tachycardia present.     Heart sounds: No murmur heard. Pulmonary:     Effort: Pulmonary effort is normal. No respiratory distress.     Breath sounds: Normal breath sounds. No wheezing, rhonchi or rales.  Chest:     Chest wall: No tenderness.  Abdominal:     General: Abdomen is flat. There is no distension.     Palpations: Abdomen is soft.     Tenderness: There is no abdominal tenderness. There is no right CVA tenderness, left CVA tenderness, guarding or rebound.  Musculoskeletal:        General: No tenderness.     Cervical back: Neck supple. No tenderness.     Right lower leg: No edema.  Skin:    General: Skin is warm and dry.     Capillary Refill: Capillary refill takes less than 2 seconds.     Findings: No erythema or rash.  Neurological:     General: No focal deficit present.     Mental Status: She is alert and oriented to person, place, and  time.     Sensory: No sensory deficit.     Motor: No weakness.  Psychiatric:        Mood and Affect: Mood normal.    ED Results / Procedures / Treatments   Labs (all labs ordered are listed, but only abnormal results are displayed) Labs Reviewed  CBC WITH DIFFERENTIAL/PLATELET - Abnormal; Notable for the following components:      Result Value   WBC 1.7 (*)    RDW 16.4 (*)    Platelets 125 (*)    Neutro Abs  0.7 (*)    All other components within normal limits  COMPREHENSIVE METABOLIC PANEL - Abnormal; Notable for the following components:   Sodium 133 (*)    Glucose, Bld 100 (*)    Calcium 8.7 (*)    AST 47 (*)    ALT 63 (*)    All other components within normal limits  URINALYSIS, ROUTINE W REFLEX MICROSCOPIC - Abnormal; Notable for the following components:   Protein, ur 100 (*)    Bacteria, UA RARE (*)    Crystals PRESENT (*)    All other components within normal limits  RESP PANEL BY RT-PCR (FLU A&B, COVID) ARPGX2  CULTURE, BLOOD (ROUTINE X 2)  CULTURE, BLOOD (ROUTINE X 2)  URINE CULTURE  LACTIC ACID, PLASMA  LACTIC ACID, PLASMA    EKG None  Radiology DG Chest Portable 1 View  Result Date: 12/13/2020 CLINICAL DATA:  Fever, on chemotherapy EXAM: PORTABLE CHEST 1 VIEW COMPARISON:  None. FINDINGS: The heart size and mediastinal contours are within normal limits. Both lungs are clear. No pleural effusion. Right chest wall port catheter tip overlies SVC the visualized skeletal structures are unremarkable. IMPRESSION: No acute process in the chest. Electronically Signed   By: Macy Mis M.D.   On: 12/13/2020 09:06    Procedures Procedures   Medications Ordered in ED Medications  acetaminophen (TYLENOL) tablet 650 mg (650 mg Oral Given 12/13/20 1309)  sodium chloride 0.9 % bolus 1,000 mL (1,000 mLs Intravenous New Bag/Given 12/13/20 1404)    ED Course  I have reviewed the triage vital signs and the nursing notes.  Pertinent labs & imaging results that were  available during my care of the patient were reviewed by me and considered in my medical decision making (see chart for details).    MDM Rules/Calculators/A&P                           KALA AMBRIZ is a 40 y.o. female with a past medical history significant for colorectal cancer who presents with fatigue and fever.  According to patient, her last chemotherapy treatment was on Saturday, 4 days ago, and yesterday she started having fevers, chills, and fatigue.  She reports overnight her temperature was up to 101.4 and she is been taking Tylenol.  Upon arrival is 99.2 orally.  She denies rhinorrhea, congestion, cough, nausea, vomiting, constipation, diarrhea, or urinary changes.  Denies any rashes.  Denies any tick exposures.  She reports he just feels tired and has the fevers and when she called her coordinator today they told her to come in for evaluation and possible work-up for neutropenic fever.  Patient otherwise denies any headache, neck pain, neck stiffness, or other complaints.  She is resting comfortably but is tachycardic in the 130s on arrival.  She is warm to the touch and oral temperature was 99.2.  We will continue to monitor this.  She is not hypotensive or tachypneic and her oxygen saturations are normal on arrival.  On exam, lungs are clear and chest is nontender.  Abdomen is nontender.  No focal neurologic deficits initially.  Back and flanks nontender.  Neck is nontender.  Normal range of motion.  No rashes seen on exam.  Patient otherwise resting comfortably but warm.  We will get a work-up started including chest x-ray to look for pneumonia, urine to look for UTI, and check for COVID/flu.  We will get screening other labs as well including cultures.  Anticipate  touching base with the oncology team after work-up is completed to discuss management.  1:05 PM Patient's work-up again to return.  COVID and flu are negative.  Patient does have low white count with WBC of 1.7 and  neutrophils of 0.7.  Urinalysis does not show convincing evidence of infection and x-ray did not show pneumonia.  Metabolic panel did not show evidence of kidney abnormality and LFT is only extremely slightly elevated at 47 and 63.  Lactic acid not elevated x2.  Patient did have more fatigue and malaise and some mild headache but did not report any neck pain to me.  I called the oncology team in consultation to discuss if she needed IV antibiotics and admission and given the patient's otherwise reassuring work-up, they felt that it was appropriate to instead treat her with Levaquin and discharge her and have her follow-up with oncology.  Care transferred to oncoming team to await reassessment.  If she remains tachycardic or is not feeling improved after fluids, anticipate discussion with patient about possible admission.  If she is feeling better, dissipate discharge with prescription for 500 mg Levaquin daily for the next week and PCP follow-up.  Care transferred to oncoming team while awaiting reassessment.   Final Clinical Impression(s) / ED Diagnoses Final diagnoses:  Other fatigue  Tachycardia    Clinical Impression: 1. Other fatigue   2. Tachycardia     Disposition: Care transferred to oncoming team while awaiting reassessment.  This note was prepared with assistance of Systems analyst. Occasional wrong-word or sound-a-like substitutions may have occurred due to the inherent limitations of voice recognition software.     Dniya Neuhaus, Gwenyth Allegra, MD 12/13/20 [phone]

## 2020-12-14 ENCOUNTER — Telehealth (HOSPITAL_BASED_OUTPATIENT_CLINIC_OR_DEPARTMENT_OTHER): Payer: Self-pay

## 2020-12-14 ENCOUNTER — Telehealth: Payer: Self-pay | Admitting: *Deleted

## 2020-12-14 ENCOUNTER — Telehealth: Payer: Self-pay

## 2020-12-14 ENCOUNTER — Other Ambulatory Visit: Payer: Self-pay

## 2020-12-14 ENCOUNTER — Encounter: Payer: Self-pay | Admitting: Oncology

## 2020-12-14 ENCOUNTER — Inpatient Hospital Stay (HOSPITAL_BASED_OUTPATIENT_CLINIC_OR_DEPARTMENT_OTHER)
Admission: EM | Admit: 2020-12-14 | Discharge: 2020-12-16 | DRG: 872 | Disposition: A | Discharge status: Home / Self Care | Payer: 59 | Attending: Internal Medicine | Admitting: Internal Medicine

## 2020-12-14 DIAGNOSIS — A4151 Sepsis due to Escherichia coli [E. coli]: Secondary | ICD-10-CM

## 2020-12-14 DIAGNOSIS — R7881 Bacteremia: Principal | ICD-10-CM

## 2020-12-14 DIAGNOSIS — C20 Malignant neoplasm of rectum: Secondary | ICD-10-CM | POA: Diagnosis present

## 2020-12-14 DIAGNOSIS — D709 Neutropenia, unspecified: Secondary | ICD-10-CM | POA: Diagnosis present

## 2020-12-14 DIAGNOSIS — Z800 Family history of malignant neoplasm of digestive organs: Secondary | ICD-10-CM

## 2020-12-14 DIAGNOSIS — D259 Leiomyoma of uterus, unspecified: Secondary | ICD-10-CM | POA: Diagnosis present

## 2020-12-14 DIAGNOSIS — B9620 Unspecified Escherichia coli [E. coli] as the cause of diseases classified elsewhere: Secondary | ICD-10-CM | POA: Diagnosis present

## 2020-12-14 DIAGNOSIS — Z95828 Presence of other vascular implants and grafts: Secondary | ICD-10-CM

## 2020-12-14 DIAGNOSIS — R5081 Fever presenting with conditions classified elsewhere: Secondary | ICD-10-CM | POA: Diagnosis present

## 2020-12-14 DIAGNOSIS — K625 Hemorrhage of anus and rectum: Secondary | ICD-10-CM | POA: Diagnosis present

## 2020-12-14 DIAGNOSIS — R7401 Elevation of levels of liver transaminase levels: Secondary | ICD-10-CM

## 2020-12-14 DIAGNOSIS — Z803 Family history of malignant neoplasm of breast: Secondary | ICD-10-CM

## 2020-12-14 DIAGNOSIS — Z888 Allergy status to other drugs, medicaments and biological substances status: Secondary | ICD-10-CM

## 2020-12-14 DIAGNOSIS — Z20822 Contact with and (suspected) exposure to covid-19: Secondary | ICD-10-CM | POA: Diagnosis present

## 2020-12-14 DIAGNOSIS — R5383 Other fatigue: Secondary | ICD-10-CM | POA: Diagnosis present

## 2020-12-14 DIAGNOSIS — T451X5A Adverse effect of antineoplastic and immunosuppressive drugs, initial encounter: Secondary | ICD-10-CM | POA: Diagnosis present

## 2020-12-14 DIAGNOSIS — R000 Tachycardia, unspecified: Secondary | ICD-10-CM | POA: Diagnosis present

## 2020-12-14 DIAGNOSIS — Z8249 Family history of ischemic heart disease and other diseases of the circulatory system: Secondary | ICD-10-CM

## 2020-12-14 DIAGNOSIS — Z885 Allergy status to narcotic agent status: Secondary | ICD-10-CM

## 2020-12-14 DIAGNOSIS — R7989 Other specified abnormal findings of blood chemistry: Secondary | ICD-10-CM

## 2020-12-14 DIAGNOSIS — K219 Gastro-esophageal reflux disease without esophagitis: Secondary | ICD-10-CM | POA: Diagnosis present

## 2020-12-14 DIAGNOSIS — Z808 Family history of malignant neoplasm of other organs or systems: Secondary | ICD-10-CM

## 2020-12-14 DIAGNOSIS — Z9221 Personal history of antineoplastic chemotherapy: Secondary | ICD-10-CM

## 2020-12-14 LAB — BLOOD CULTURE ID PANEL (REFLEXED) - BCID2

## 2020-12-14 LAB — URINALYSIS, ROUTINE W REFLEX MICROSCOPIC
Bilirubin Urine: NEGATIVE
Glucose, UA: NEGATIVE mg/dL
Ketones, ur: NEGATIVE mg/dL
Leukocytes,Ua: NEGATIVE
Nitrite: NEGATIVE
Protein, ur: 100 mg/dL — AB
Specific Gravity, Urine: 1.027 (ref 1.005–1.030)
pH: 6 (ref 5.0–8.0)

## 2020-12-14 LAB — COMPREHENSIVE METABOLIC PANEL
ALT: 165 U/L — ABNORMAL HIGH (ref 0–44)
AST: 133 U/L — ABNORMAL HIGH (ref 15–41)
Albumin: 4.6 g/dL (ref 3.5–5.0)
Alkaline Phosphatase: 101 U/L (ref 38–126)
Anion gap: 10 (ref 5–15)
BUN: 14 mg/dL (ref 6–20)
CO2: 21 mmol/L — ABNORMAL LOW (ref 22–32)
Calcium: 8.7 mg/dL — ABNORMAL LOW (ref 8.9–10.3)
Chloride: 103 mmol/L (ref 98–111)
Creatinine, Ser: 0.64 mg/dL (ref 0.44–1.00)
GFR, Estimated: >60 mL/min (ref >60)
Glucose, Bld: 108 mg/dL — ABNORMAL HIGH (ref 70–99)
Potassium: 3.9 mmol/L (ref 3.5–5.1)
Sodium: 134 mmol/L — ABNORMAL LOW (ref 135–145)
Total Bilirubin: 0.4 mg/dL (ref 0.3–1.2)
Total Protein: 8.5 g/dL — ABNORMAL HIGH (ref 6.5–8.1)

## 2020-12-14 LAB — LACTIC ACID, PLASMA
Lactic Acid, Venous: 1.1 mmol/L (ref 0.5–1.9)
Lactic Acid, Venous: 1.2 mmol/L (ref 0.5–1.9)

## 2020-12-14 LAB — URINE CULTURE: Culture: NO GROWTH

## 2020-12-14 LAB — CBC WITH DIFFERENTIAL/PLATELET
Abs Immature Granulocytes: 0 K/uL (ref 0.00–0.07)
Basophils Absolute: 0.1 K/uL (ref 0.0–0.1)
Basophils Relative: 2 %
Eosinophils Absolute: 0 K/uL (ref 0.0–0.5)
Eosinophils Relative: 0 %
HCT: 42.5 % (ref 36.0–46.0)
Hemoglobin: 13.7 g/dL (ref 12.0–15.0)
Lymphocytes Relative: 66 %
Lymphs Abs: 1.8 K/uL (ref 0.7–4.0)
MCH: 26.9 pg (ref 26.0–34.0)
MCHC: 32.2 g/dL (ref 30.0–36.0)
MCV: 83.3 fL (ref 80.0–100.0)
Monocytes Absolute: 0.2 K/uL (ref 0.1–1.0)
Monocytes Relative: 6 %
Neutro Abs: 0.7 K/uL — ABNORMAL LOW (ref 1.7–7.7)
Neutrophils Relative %: 26 %
Platelet Morphology: NORMAL
Platelets: 151 K/uL (ref 150–400)
RBC: 5.1 MIL/uL (ref 3.87–5.11)
RDW: 16.5 % — ABNORMAL HIGH (ref 11.5–15.5)
WBC: 2.8 K/uL — ABNORMAL LOW (ref 4.0–10.5)
nRBC: 0 % (ref 0.0–0.2)

## 2020-12-14 LAB — PREGNANCY, URINE: Preg Test, Ur: NEGATIVE

## 2020-12-14 MED ORDER — SODIUM CHLORIDE 0.9 % IV SOLN
2.0000 g | Freq: Three times a day (TID) | INTRAVENOUS | Status: DC
  Administered 2020-12-14 – 2020-12-16 (×5): 2 g via INTRAVENOUS
  Filled 2020-12-14 (×5): qty 2

## 2020-12-14 MED ORDER — SODIUM CHLORIDE 0.9 % IV BOLUS
1000.0000 mL | Freq: Once | INTRAVENOUS | Status: AC
  Administered 2020-12-14: 1000 mL via INTRAVENOUS

## 2020-12-14 NOTE — ED Notes (Signed)
Called [facility] and was advised bed is ready waiting on charge nurse to change to bed ready.  Called [facility] to transport patient to [place] room [phone]

## 2020-12-14 NOTE — Telephone Encounter (Signed)
Left VM for patient to return call [id number]

## 2020-12-14 NOTE — ED Provider Notes (Signed)
[facility] EMERGENCY DEPT Provider Note   CSN: [id number] Arrival date & time: 12/14/20  1617     History Chief Complaint  Patient presents with   Weakness    Sharon Bennett is a 40 y.o. female.  With past medical history of rectal cancer who presents to the emergency department with fatigue and fever.  She states that she initially started feeling poorly on Monday night.  States that she had fever at that time.  She states she intermittently felt "really sick" but thought it was due to her chemo treatments.  She states that she had chemo on Thursday through Saturday, and states that by Tuesday she is normally feeling better and able to go back to work.  She states that this has not been the case this week.  She states that she came to the emergency department yesterday and received fluids and an oral antibiotic.  She states that they called her today that her blood cultures were positive and that she needed to return to the emergency department.  Today she states that she had fever last night of 100.1.  She states that she is "just feeling a lot of fatigue."  She states that she is intermittently short of breath and yesterday felt like she had increased heart rate.  She also endorses nausea without vomiting.  She states that she is having "occasional abdominal cramping but this is usually from my chemo."  She denies chest pain, diarrhea, new rashes, lightheadedness or dizziness, syncope.  On chart review she was seen yesterday for fever of 104 on Monday night.  She had an infection evaluation.  Her lactic was normal, she had a chest x-ray without any focal findings, COVID and flu were negative, her UA without signs of infection.  She was discharged with Levaquin after the attending spoke with oncologist.  They sent blood cultures which ended up being positive for Enterobacterales and E. Coli.    Weakness Associated symptoms: fever, nausea and shortness of breath    Weakness Associated symptoms: fever, nausea and shortness of breath   Weakness Associated symptoms: fever, nausea and shortness of breath   Weakness Associated symptoms: fever, nausea and shortness of breath   Weakness Associated symptoms: fever, nausea and shortness of breath   Weakness Associated symptoms: fever and nausea   Weakness Associated symptoms: fever       Past Medical History:  Diagnosis Date   Colorectal cancer ([place])    Family history of brain cancer    Family history of breast cancer    Family history of pancreatic cancer     Patient Active Problem List   Diagnosis Date Noted   Genetic testing 10/06/2020   Uterine fibroid 09/22/2020   Family history of pancreatic cancer 09/09/2020   Family history of brain cancer 09/09/2020   Family history of breast cancer 09/09/2020   Rectal cancer ([place]) 09/05/2020    Past Surgical History:  Procedure Laterality Date   IR IMAGING GUIDED PORT INSERTION  09/09/2020     OB History   No obstetric history on file.     Family History  Problem Relation Age of Onset   Hypertension Mother    Heart failure Father    Hypertension Father    Pancreatic cancer Maternal Grandfather        dx 36s   Brain cancer Paternal Grandmother        dx >50   Breast cancer Other 60       MGF's  sister    Social History   Tobacco Use   Smoking status: Never   Smokeless tobacco: Never  Vaping Use   Vaping Use: Never used    Home Medications Prior to Admission medications   Medication Sig Start Date End Date Taking? Authorizing Provider  fluconazole (DIFLUCAN) 100 MG tablet Take 1 tablet (100 mg total) by mouth daily. 12/08/20   Ladell Pier, MD  levofloxacin (LEVAQUIN) 500 MG tablet Take 1 tablet (500 mg total) by mouth daily for 6 days. 12/14/20 12/20/20  Wyvonnia Dusky, MD  lidocaine-prilocaine (EMLA) cream Apply 1 application topically as needed. Apply 1/2 tablespoon to port site and cover with plastic wrap 2 hours  prior to stick to numb site Patient not taking: No sig reported 09/23/20   Ladell Pier, MD  melatonin 3 MG TABS tablet Take 3 mg by mouth at bedtime. Patient not taking: No sig reported    [provider]  ondansetron (ZOFRAN) 8 MG tablet Take 1 tablet (8 mg total) by mouth every 8 (eight) hours as needed for nausea or vomiting. Patient not taking: No sig reported 09/12/20   Ladell Pier, MD  pantoprazole (PROTONIX) 40 MG tablet TAKE 1 TABLET BY MOUTH EVERY DAY 10/27/20   Ladell Pier, MD  prochlorperazine (COMPAZINE) 10 MG tablet Take 1 tablet (10 mg total) by mouth every 6 (six) hours as needed for nausea or vomiting. 09/12/20   Ladell Pier, MD  traMADol (ULTRAM) 50 MG tablet Take 1 tablet (50 mg total) by mouth every 6 (six) hours as needed for moderate pain. Patient not taking: No sig reported 09/29/20   Ladell Pier, MD    Allergies    Codeine and Tegaderm ag mesh [silver]  Review of Systems   Review of Systems  Constitutional:  Positive for fatigue and fever.  Respiratory:  Positive for shortness of breath.   Gastrointestinal:  Positive for nausea.  Neurological:  Positive for weakness.  All other systems reviewed and are negative.  Physical Exam Updated Vital Signs BP (!) 174/111 (BP Location: Right Arm)   Pulse 74   Temp 98.2 F (36.8 C)   Resp 14   SpO2 100%   Physical Exam Vitals and nursing note reviewed.  Constitutional:      Appearance: Normal appearance. She is ill-appearing.  HENT:     Head: Normocephalic and atraumatic.     Nose: Nose normal.     Mouth/Throat:     Mouth: Mucous membranes are moist.     Pharynx: Oropharynx is clear.  Eyes:     General: No scleral icterus.    Extraocular Movements: Extraocular movements intact.     Pupils: Pupils are equal, round, and reactive to light.  Cardiovascular:     Rate and Rhythm: Regular rhythm. Tachycardia present.     Pulses: Normal pulses.     Heart sounds: No murmur  heard. Pulmonary:     Effort: Pulmonary effort is normal. No respiratory distress.     Breath sounds: Normal breath sounds.  Abdominal:     General: Bowel sounds are normal. There is no distension.     Palpations: Abdomen is soft.     Tenderness: There is no abdominal tenderness.  Musculoskeletal:        General: Normal range of motion.     Cervical back: Normal range of motion and neck supple.  Skin:    General: Skin is warm and dry.     Capillary Refill:  Capillary refill takes less than 2 seconds.  Neurological:     General: No focal deficit present.     Mental Status: She is alert and oriented to person, place, and time. Mental status is at baseline.  Psychiatric:        Mood and Affect: Mood normal.        Behavior: Behavior normal.        Thought Content: Thought content normal.        Judgment: Judgment normal.    ED Results / Procedures / Treatments   Labs (all labs ordered are listed, but only abnormal results are displayed) Labs Reviewed  CBC WITH DIFFERENTIAL/PLATELET - Abnormal; Notable for the following components:      Result Value   WBC 2.8 (*)    RDW 16.5 (*)    Neutro Abs 0.7 (*)    All other components within normal limits  COMPREHENSIVE METABOLIC PANEL - Abnormal; Notable for the following components:   Sodium 134 (*)    CO2 21 (*)    Glucose, Bld 108 (*)    Calcium 8.7 (*)    Total Protein 8.5 (*)    AST 133 (*)    ALT 165 (*)    All other components within normal limits  URINALYSIS, ROUTINE W REFLEX MICROSCOPIC - Abnormal; Notable for the following components:   Hgb urine dipstick SMALL (*)    Protein, ur 100 (*)    All other components within normal limits  CULTURE, BLOOD (ROUTINE X 2)  CULTURE, BLOOD (ROUTINE X 2)  URINE CULTURE  RESP PANEL BY RT-PCR (FLU A&B, COVID) ARPGX2  LACTIC ACID, PLASMA  LACTIC ACID, PLASMA  PREGNANCY, URINE  PATHOLOGIST SMEAR REVIEW    EKG None  Radiology DG Chest Portable 1 View  Result Date:  12/13/2020 CLINICAL DATA:  Fever, on chemotherapy EXAM: PORTABLE CHEST 1 VIEW COMPARISON:  None. FINDINGS: The heart size and mediastinal contours are within normal limits. Both lungs are clear. No pleural effusion. Right chest wall port catheter tip overlies SVC the visualized skeletal structures are unremarkable. IMPRESSION: No acute process in the chest. Electronically Signed   By: Macy Mis M.D.   On: 12/13/2020 09:06    Procedures Procedures   Medications Ordered in ED Medications  ceFEPIme (MAXIPIME) 2 g in sodium chloride 0.9 % 100 mL IVPB (0 g Intravenous Stopped 12/14/20 1810)  sodium chloride 0.9 % bolus 1,000 mL (1,000 mLs Intravenous New Bag/Given 12/14/20 2010)    ED Course  I have reviewed the triage vital signs and the nursing notes.  Pertinent labs & imaging results that were available during my care of the patient were reviewed by me and considered in my medical decision making (see chart for details).    MDM Rules/Calculators/A&P 40 year old female with a history of rectal cancer who presents emergency department with bacteremia.  She had full infectious work-up yesterday which was unremarkable other than blood cultures being positive for Enterobacter and E. coli.  Today she has had tachycardia and generalized fatigue Leukopenia 2.8 -improved from yesterday Neutropenia 700 Transaminitis which has increased from yesterday UA without signs of UTI Lactic 1.2 Repeat blood culture sent Started on cefepime here in the emergency department.  Given a liter of fluids for tachycardia.  I have spoken with Dr. Bridgett Larsson who agrees to admit the patient at this time. Final Clinical Impression(s) / ED Diagnoses Final diagnoses:  Sepsis due to Escherichia coli, unspecified whether acute organ dysfunction present [facility])    Rx /  DC Orders ED Discharge Orders     None        Bing Matter 12/14/20 2113    Gareth Morgan, MD 12/16/20 1356

## 2020-12-14 NOTE — ED Notes (Signed)
Pt declined covid swab since she had it yesterday at [facility].

## 2020-12-14 NOTE — Progress Notes (Signed)
Pharmacy Antibiotic Note  Sharon Bennett is a 40 y.o. female admitted on (Not on file) with E Coli bacteremia from 11/8 while in ED and was called to return for treatment. Was discharged on levaquin yesterday and took doses appropriately. Pharmacy has been consulted for Cefepime dosing.  Plan: Cefepime 2g IV q8h -Monitor renal function, clinical status, and antibiotic plan  No data recorded.  Recent Labs  Lab 12/08/20 0846 12/13/20 0947 12/13/20 1120  WBC 5.2 1.7*  --   CREATININE 0.61 0.72  --   LATICACIDVEN  --  1.2 1.5    Estimated Creatinine Clearance: 95.1 mL/min (by C-G formula based on SCr of 0.72 mg/dL).    Allergies  Allergen Reactions   Codeine Nausea And Vomiting   Tegaderm Ag Mesh [Silver] Rash    Allergic reaction to Tegaderm dressing    Antimicrobials this admission: Cefepime 11/9 >>   Microbiology results: 11/8 BCx: E Coli  Thank you for allowing pharmacy to be a part of this patient's care.  Joetta Manners, PharmD, [facility] Emergency Medicine Clinical Pharmacist ED RPh Phone: [place]: [phone]

## 2020-12-14 NOTE — Telephone Encounter (Signed)
This RN was able to reach patient on phone and advise her to follow up at ED related to her positive blood culture. Pt agrees.

## 2020-12-14 NOTE — ED Triage Notes (Signed)
Pt POV reports receiving call telling her to return to ED due to positive blood cultures. Pt reports feeling about the same as yesterday, generalized weakness. States she did take prescribed antibiotics today, received first dose yesterday in ED. Reports only being able to keep small amounts of food down d/t nausea.

## 2020-12-14 NOTE — Telephone Encounter (Signed)
This RN attempted to call patient about positive BC X3 with no answer, left VM. Dykstra ED MD at time of [facility] call back advises to continue to attempt to call patient to have her follow up at ED. Will continue to try to reach patient.

## 2020-12-14 NOTE — Telephone Encounter (Signed)
Pt left v/m message stating she received a call from the ER stating she had to return back to the ER because of positive blood cultures. Pt was inquiring if she could come to the [facility] to get blood cultures drawn. Returned called to Pt to inform her that Dr [name] wants her to return to the ER. Pt verbalized understanding.

## 2020-12-14 NOTE — ED Notes (Signed)
Pt prefers not to have respiratory swab performed at this time. She reports she was tested yesterday while in ED, results negative and accessible in her chart. She reports once she left ED she went directly home, where she remained until coming to the ED today. EDP made aware.

## 2020-12-15 ENCOUNTER — Inpatient Hospital Stay (HOSPITAL_COMMUNITY): Payer: 59

## 2020-12-15 ENCOUNTER — Encounter (HOSPITAL_COMMUNITY): Payer: Self-pay | Admitting: Internal Medicine

## 2020-12-15 DIAGNOSIS — Z9221 Personal history of antineoplastic chemotherapy: Secondary | ICD-10-CM | POA: Diagnosis not present

## 2020-12-15 DIAGNOSIS — B9620 Unspecified Escherichia coli [E. coli] as the cause of diseases classified elsewhere: Secondary | ICD-10-CM | POA: Diagnosis present

## 2020-12-15 DIAGNOSIS — Z888 Allergy status to other drugs, medicaments and biological substances status: Secondary | ICD-10-CM | POA: Diagnosis not present

## 2020-12-15 DIAGNOSIS — Z8249 Family history of ischemic heart disease and other diseases of the circulatory system: Secondary | ICD-10-CM | POA: Diagnosis not present

## 2020-12-15 DIAGNOSIS — Z800 Family history of malignant neoplasm of digestive organs: Secondary | ICD-10-CM | POA: Diagnosis not present

## 2020-12-15 DIAGNOSIS — Z20822 Contact with and (suspected) exposure to covid-19: Secondary | ICD-10-CM | POA: Diagnosis present

## 2020-12-15 DIAGNOSIS — R7881 Bacteremia: Secondary | ICD-10-CM | POA: Diagnosis not present

## 2020-12-15 DIAGNOSIS — R7989 Other specified abnormal findings of blood chemistry: Secondary | ICD-10-CM | POA: Diagnosis not present

## 2020-12-15 DIAGNOSIS — R5081 Fever presenting with conditions classified elsewhere: Secondary | ICD-10-CM | POA: Diagnosis present

## 2020-12-15 DIAGNOSIS — R7401 Elevation of levels of liver transaminase levels: Secondary | ICD-10-CM | POA: Diagnosis present

## 2020-12-15 DIAGNOSIS — R000 Tachycardia, unspecified: Secondary | ICD-10-CM | POA: Diagnosis present

## 2020-12-15 DIAGNOSIS — D259 Leiomyoma of uterus, unspecified: Secondary | ICD-10-CM | POA: Diagnosis present

## 2020-12-15 DIAGNOSIS — A4150 Gram-negative sepsis, unspecified: Secondary | ICD-10-CM | POA: Diagnosis present

## 2020-12-15 DIAGNOSIS — D709 Neutropenia, unspecified: Secondary | ICD-10-CM | POA: Diagnosis present

## 2020-12-15 DIAGNOSIS — K625 Hemorrhage of anus and rectum: Secondary | ICD-10-CM | POA: Diagnosis present

## 2020-12-15 DIAGNOSIS — Z808 Family history of malignant neoplasm of other organs or systems: Secondary | ICD-10-CM | POA: Diagnosis not present

## 2020-12-15 DIAGNOSIS — R5383 Other fatigue: Secondary | ICD-10-CM | POA: Diagnosis present

## 2020-12-15 DIAGNOSIS — Z885 Allergy status to narcotic agent status: Secondary | ICD-10-CM | POA: Diagnosis not present

## 2020-12-15 DIAGNOSIS — C20 Malignant neoplasm of rectum: Secondary | ICD-10-CM | POA: Diagnosis present

## 2020-12-15 DIAGNOSIS — T451X5A Adverse effect of antineoplastic and immunosuppressive drugs, initial encounter: Secondary | ICD-10-CM | POA: Diagnosis present

## 2020-12-15 DIAGNOSIS — K219 Gastro-esophageal reflux disease without esophagitis: Secondary | ICD-10-CM | POA: Diagnosis present

## 2020-12-15 DIAGNOSIS — Z803 Family history of malignant neoplasm of breast: Secondary | ICD-10-CM | POA: Diagnosis not present

## 2020-12-15 DIAGNOSIS — Z95828 Presence of other vascular implants and grafts: Secondary | ICD-10-CM | POA: Diagnosis not present

## 2020-12-15 LAB — DIFFERENTIAL
Abs Immature Granulocytes: 0.02 10*3/uL (ref 0.00–0.07)
Basophils Absolute: 0 10*3/uL (ref 0.0–0.1)
Basophils Relative: 1 %
Eosinophils Absolute: 0 10*3/uL (ref 0.0–0.5)
Eosinophils Relative: 1 %
Immature Granulocytes: 1 %
Lymphocytes Relative: 64 %
Lymphs Abs: 2.7 10*3/uL (ref 0.7–4.0)
Monocytes Absolute: 0.5 10*3/uL (ref 0.1–1.0)
Monocytes Relative: 12 %
Neutro Abs: 0.9 10*3/uL — ABNORMAL LOW (ref 1.7–7.7)
Neutrophils Relative %: 21 %

## 2020-12-15 LAB — CBC
HCT: 37.6 % (ref 36.0–46.0)
Hemoglobin: 12.1 g/dL (ref 12.0–15.0)
MCH: 27.5 pg (ref 26.0–34.0)
MCHC: 32.2 g/dL (ref 30.0–36.0)
MCV: 85.5 fL (ref 80.0–100.0)
Platelets: 140 10*3/uL — ABNORMAL LOW (ref 150–400)
RBC: 4.4 MIL/uL (ref 3.87–5.11)
RDW: 16.7 % — ABNORMAL HIGH (ref 11.5–15.5)
WBC: 4.2 10*3/uL (ref 4.0–10.5)
nRBC: 0 % (ref 0.0–0.2)

## 2020-12-15 LAB — URINE CULTURE: Culture: NO GROWTH

## 2020-12-15 LAB — PATHOLOGIST SMEAR REVIEW

## 2020-12-15 LAB — HIV ANTIBODY (ROUTINE TESTING W REFLEX): HIV Screen 4th Generation wRfx: NONREACTIVE

## 2020-12-15 IMAGING — US US ABDOMEN LIMITED
1 series · 15 of 25 positions shown · non-contrast
Comparison: CT scan [DATE]

CLINICAL DATA: Elevated liver function studies.

EXAM:
ULTRASOUND ABDOMEN LIMITED RIGHT UPPER QUADRANT

[Series 1: us abdomen limited ruq mc & wl · 15 of 36 slices shown]
[im 1/36]
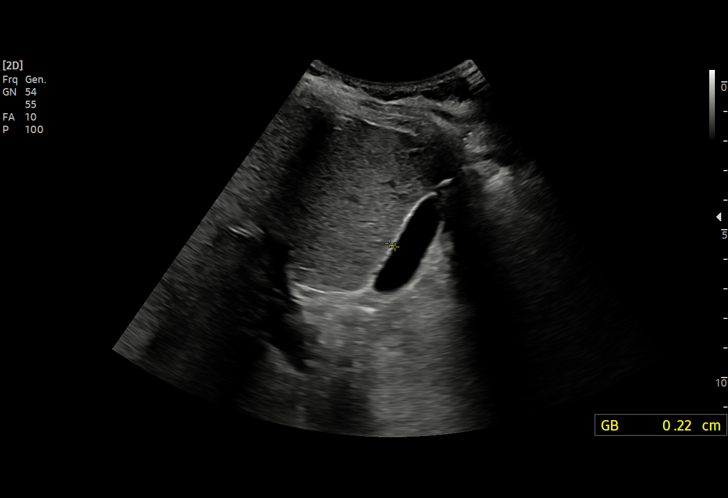
[im 3/36]
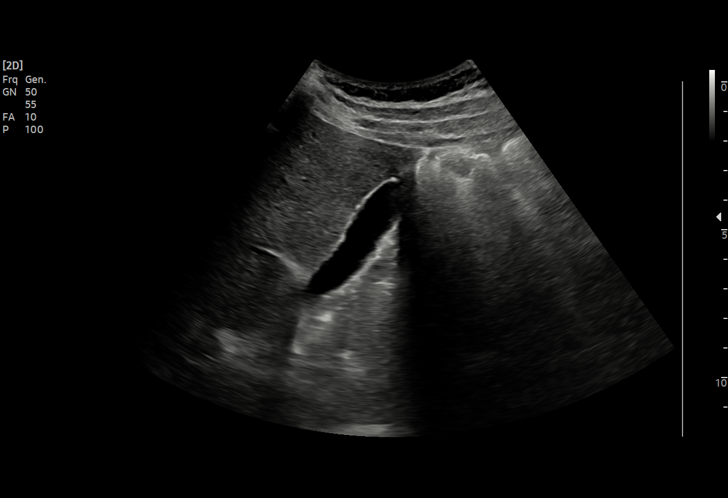
[im 6/36]
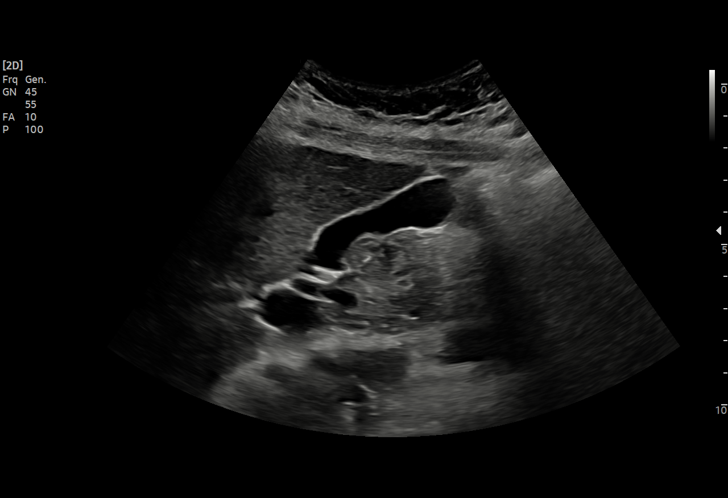
[im 8/36]
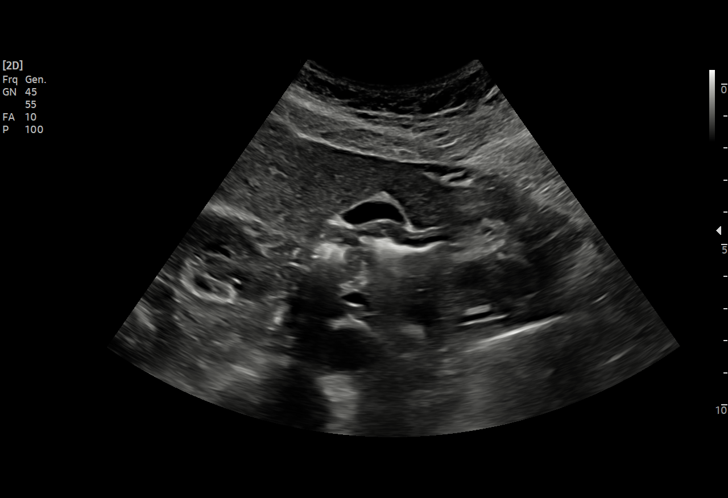
[im 11/36]
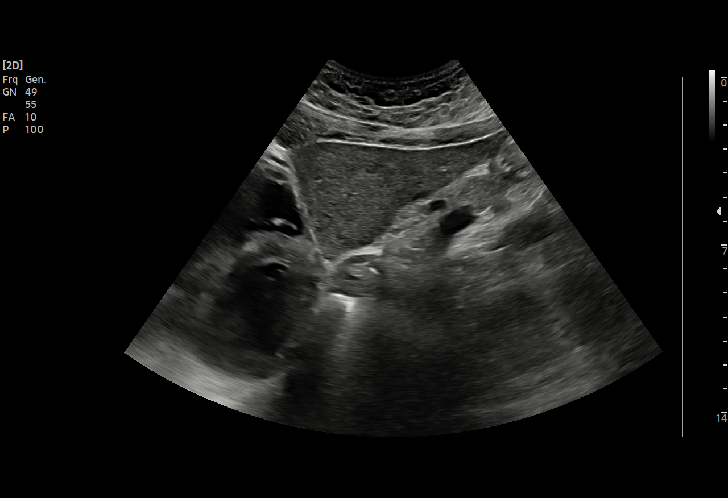
[im 14/36]
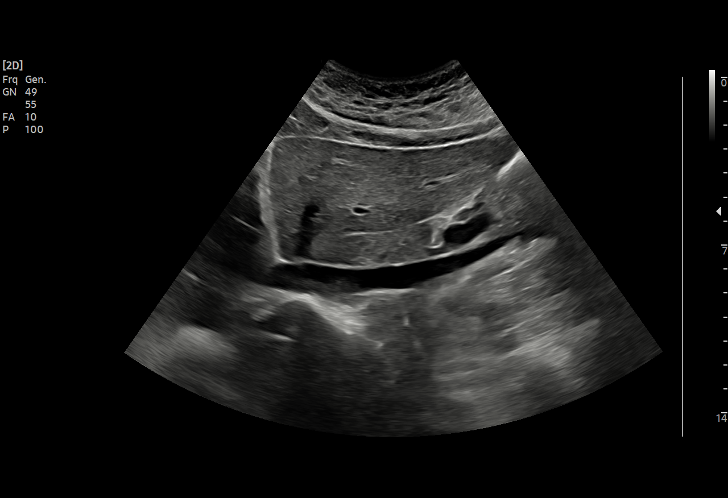
[im 15/36]
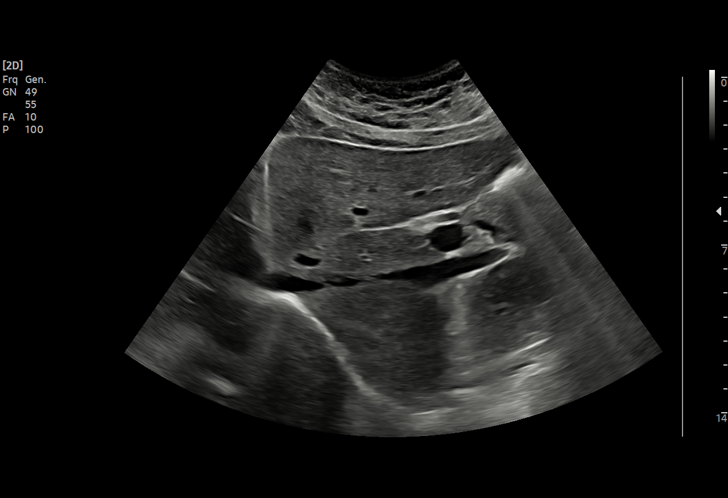
[im 18/36]
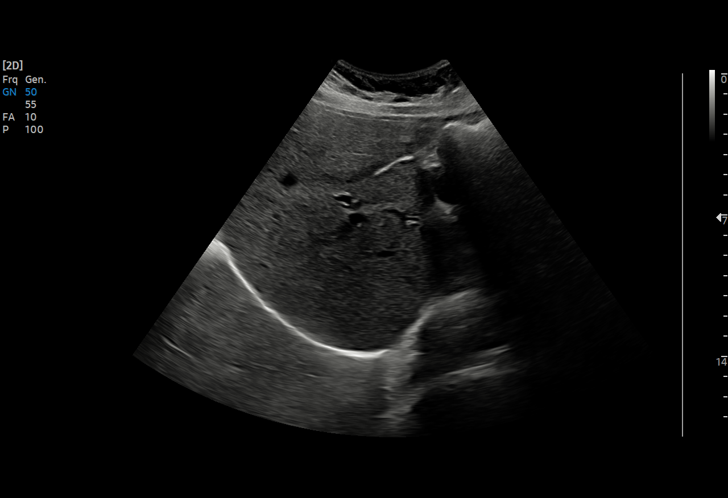
[im 21/36]
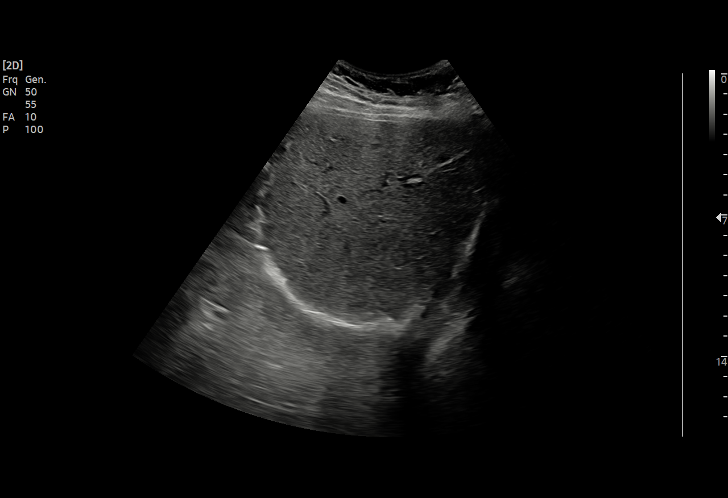
[im 22/36]
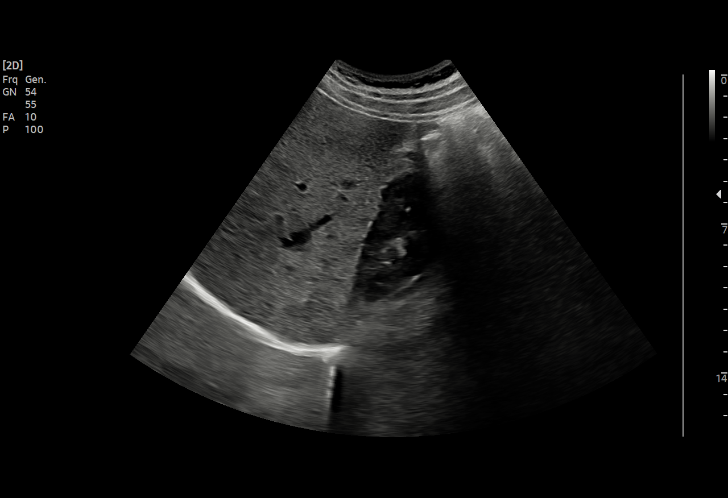
[im 25/36]
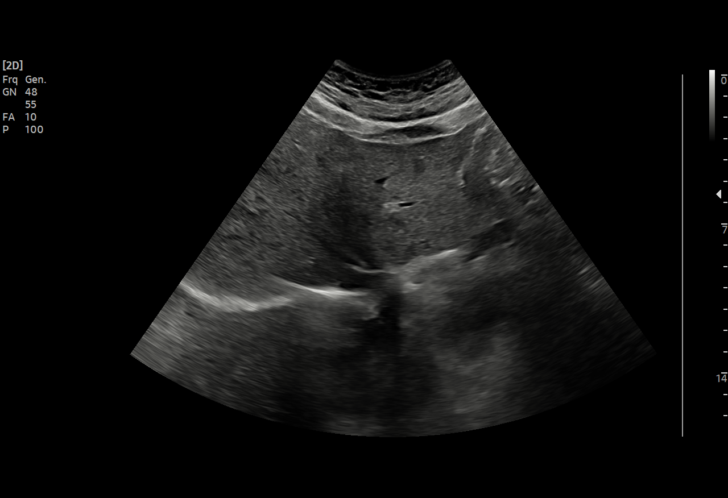
[im 28/36]
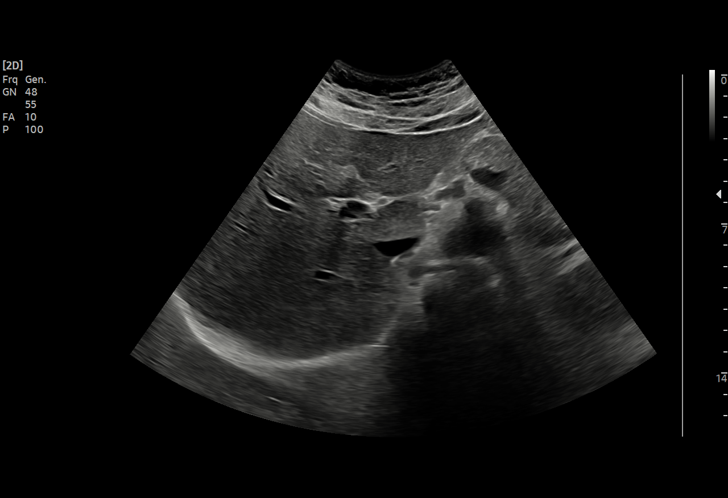
[im 30/36]
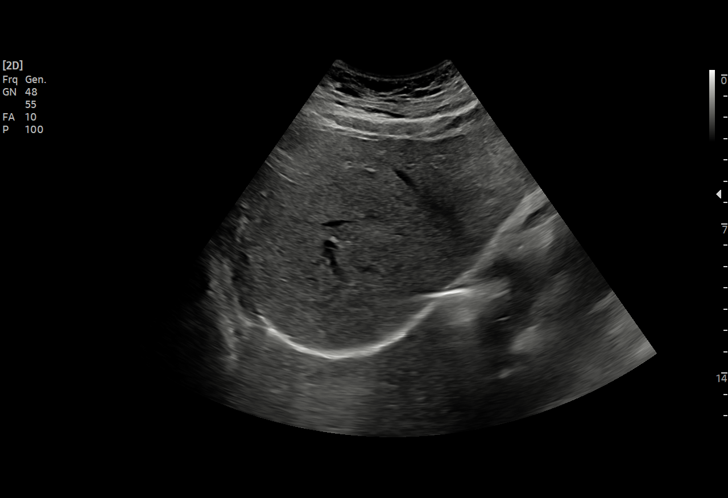
[im 33/36]
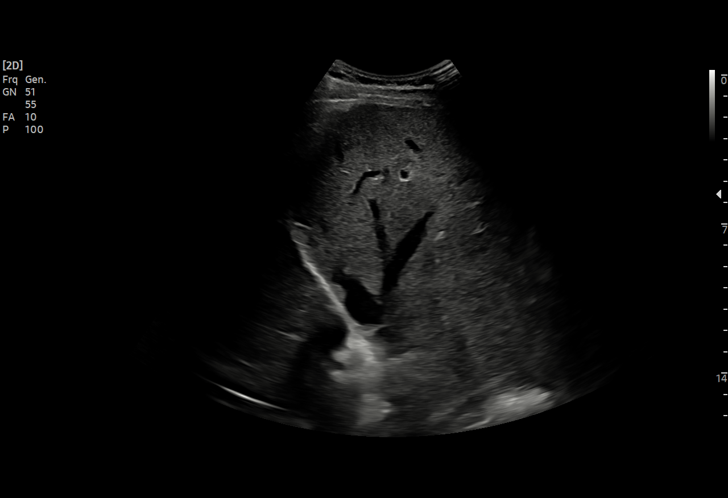
[im 36/36]
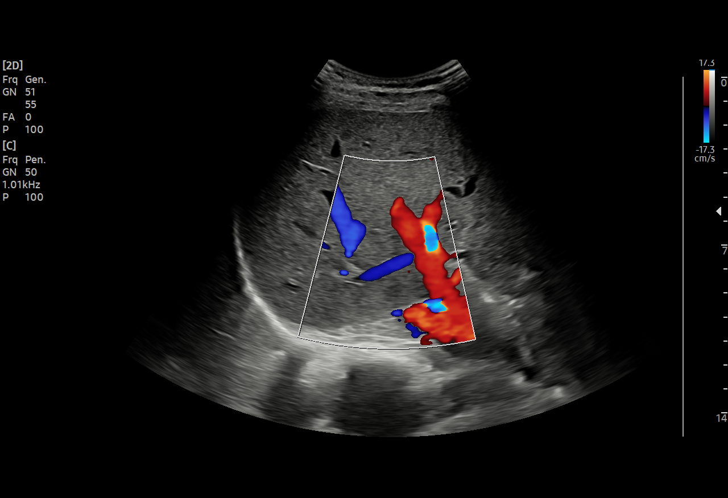

[15 of 25 positions shown; findings below may reference images not displayed]

FINDINGS: Gallbladder:

No gallstones or wall thickening visualized. No sonographic Murphy
sign noted by sonographer.

Common bile duct:

Diameter: 4.0 mm

Liver:

Normal echogenicity without focal lesion or biliary dilatation.
Portal vein is patent on color Doppler imaging with normal direction
of blood flow towards the liver.

Other: None.
IMPRESSION: Unremarkable right upper quadrant ultrasound examination.

## 2020-12-15 MED ORDER — POLYETHYLENE GLYCOL 3350 17 G PO PACK
17.0000 g | PACK | Freq: Every day | ORAL | Status: DC
  Administered 2020-12-15 – 2020-12-16 (×2): 17 g via ORAL
  Filled 2020-12-15 (×2): qty 1

## 2020-12-15 MED ORDER — SODIUM CHLORIDE 0.9 % IV SOLN: INTRAVENOUS | Status: AC

## 2020-12-15 MED ORDER — MELATONIN 3 MG PO TABS
3.0000 mg | ORAL_TABLET | Freq: Every day | ORAL | Status: DC
  Administered 2020-12-15: 3 mg via ORAL
  Filled 2020-12-15: qty 1

## 2020-12-15 MED ORDER — ENOXAPARIN SODIUM 40 MG/0.4ML IJ SOSY
40.0000 mg | PREFILLED_SYRINGE | INTRAMUSCULAR | Status: DC
  Filled 2020-12-15: qty 0.4

## 2020-12-15 NOTE — Progress Notes (Addendum)
HEMATOLOGY-ONCOLOGY PROGRESS NOTE  SUBJECTIVE: [name] is followed by our office for rectal cancer.  She has been receiving neoadjuvant chemotherapy with FOLFOX.  She received cycle #7 on 12/08/2020.  She was seen in the ED recently for fever and fatigue.  Blood cultures were drawn during that ED visit and she was found to have bacteremia.  She was admitted on 11/9 due to the positive blood culture.  She has been started on IV antibiotics.  Today, she reports that she feels better.  She is not having any fevers or chills.  She has no abdominal pain, nausea, vomiting.  Oncology History  Rectal cancer ([place])  09/05/2020 Initial Diagnosis   Rectal cancer ([place])   09/05/2020 Cancer Staging   Staging form: Colon and Rectum, AJCC 8th Edition - Clinical: Stage IIIC (cT3, cN2b, cM0) - Signed by Ladell Pier, MD on 09/05/2020    09/15/2020 -  Chemotherapy   Patient is on Treatment Plan : COLORECTAL FOLFOX q14d x 4 months      Genetic Testing   Negative genetic testing. No pathogenic variants identified on the [facility] Multi-Cancer Panel. VUS in MUTYH called c.1532C>T (p.Ser511Phe) identified. The report date is 09/24/2020.   The Multi-Cancer Panel offered by [facility] includes sequencing and/or deletion duplication testing of the following 84 genes: AIP, ALK, APC, ATM, AXIN2,BAP1,  BARD1, BLM, BMPR1A, BRCA1, BRCA2, BRIP1, CASR, CDC73, CDH1, CDK4, CDKN1B, CDKN1C, CDKN2A (p14ARF), CDKN2A (p16INK4a), CEBPA, CHEK2, CTNNA1, DICER1, DIS3L2, EGFR (c.2369C>T, p.Thr790Met variant only), EPCAM (Deletion/duplication testing only), FH, FLCN, GATA2, GPC3, GREM1 (Promoter region deletion/duplication testing only), HOXB13 (c.251G>A, p.Gly84Glu), HRAS, KIT, MAX, MEN1, MET, MITF (c.952G>A, p.Glu318Lys variant only), MLH1, MSH2, MSH3, MSH6, MUTYH, NBN, NF1, NF2, NTHL1, PALB2, PDGFRA, PHOX2B, PMS2, POLD1, POLE, POT1, PRKAR1A, PTCH1, PTEN, RAD50, RAD51C, RAD51D, RB1, RECQL4, RET, RUNX1, SDHAF2, SDHA (sequence changes only), SDHB,  SDHC, SDHD, SMAD4, SMARCA4, SMARCB1, SMARCE1, STK11, SUFU, TERC, TERT, TMEM127, TP53, TSC1, TSC2, VHL, WRN and WT1.     PHYSICAL EXAMINATION:  Vitals:   12/15/20 1230 12/15/20 1505  BP: 132/86 128/88  Pulse: 99 97  Resp: 18 16  Temp: 98.2 F (36.8 C) 98 F (36.7 C)  SpO2: 100% 98%   Filed Weights   12/15/20 0031  Weight: 158 lb 8.2 oz (71.9 kg)    Intake/Output from previous day: 11/09 0701 - 11/10 0700 In: 1440 [P.O.:240; IV Piggyback:1200] Out: -   GENERAL:alert, no distress and comfortable SKIN: skin color, texture, turgor are normal, no rashes or significant lesions EYES: normal, Conjunctiva are pink and non-injected, sclera clear OROPHARYNX:no exudate, no erythema and lips, buccal mucosa, and tongue normal  LUNGS: clear to auscultation and percussion with normal breathing effort HEART: regular rate & rhythm and no murmurs and no lower extremity edema ABDOMEN:abdomen soft, non-tender and normal bowel sounds NEURO: alert & oriented x 3 with fluent speech, no focal motor/sensory deficits  LABORATORY DATA:  I have reviewed the data as listed CMP Latest Ref Rng & Units 12/14/2020 12/13/2020 12/08/2020  Glucose 70 - 99 mg/dL 108(H) 100(H) 98  BUN 6 - 20 mg/dL _0 Creatinine 0.44 - 1.00 mg/dL 0.64 0.72 0.61  Sodium 135 - 145 mmol/L 134(L) 133(L) 138  Potassium 3.5 - 5.1 mmol/L 3.9 3.8 3.6  Chloride 98 - 111 mmol/L 103 100 105  CO2 22 - 32 mmol/L 21(L) 23 26  Calcium 8.9 - 10.3 mg/dL 8.7(L) 8.7(L) 8.6(L)  Total Protein 6.5 - 8.1 g/dL 8.5(H) 7.8 7.0  Total Bilirubin 0.3 - 1.2  mg/dL 0.4 0.7 0.5  Alkaline Phos 38 - 126 U/L 101 84 62  AST 15 - 41 U/L 133(H) 47(H) 26  ALT 0 - 44 U/L 165(H) 63(H) 36    Lab Results  Component Value Date   WBC 4.2 12/15/2020   HGB 12.1 12/15/2020   HCT 37.6 12/15/2020   MCV 85.5 12/15/2020   PLT 140 (L) 12/15/2020   NEUTROABS 0.9 (L) 12/15/2020    DG Chest Portable 1 View  Result Date: 12/13/2020 CLINICAL DATA:  Fever, on  chemotherapy EXAM: PORTABLE CHEST 1 VIEW COMPARISON:  None. FINDINGS: The heart size and mediastinal contours are within normal limits. Both lungs are clear. No pleural effusion. Right chest wall port catheter tip overlies SVC the visualized skeletal structures are unremarkable. IMPRESSION: No acute process in the chest. Electronically Signed   By: Macy Mis M.D.   On: 12/13/2020 09:06   US Abdomen Limited RUQ (LIVER/GB)  Result Date: 12/15/2020 CLINICAL DATA:  Elevated liver function studies. EXAM: ULTRASOUND ABDOMEN LIMITED RIGHT UPPER QUADRANT COMPARISON:  CT scan 08/21/2020 FINDINGS: Gallbladder: No gallstones or wall thickening visualized. No sonographic Murphy sign noted by sonographer. Common bile duct: Diameter: 4.0 mm Liver: Normal echogenicity without focal lesion or biliary dilatation. Portal vein is patent on color Doppler imaging with normal direction of blood flow towards the liver. Other: None. IMPRESSION: Unremarkable right upper quadrant ultrasound examination. Electronically Signed   By: Marijo Sanes M.D.   On: 12/15/2020 08:10    ASSESSMENT AND PLAN: Rectal cancer  CT abdomen/pelvis 08/21/2020-large rectal mass measuring 4.4 x 2.6 x 5.5 cm, haziness in the mesorectal fat, obscuration of the fat plane between the mass and the adjacent posterior aspect of the uterus, numerous prominent mesorectal lymph nodes measuring up to 5 mm Colonoscopy 08/23/2020-fungating infiltrative nonobstructing large mass found in the rectum.  Mass was noncircumferential, measured 5 cm in length, 3 cm in diameter and was 7 cm from the anal verge.  No bleeding was present.   Pathology showed invasive colonic adenocarcinoma, moderately differentiated.  No evidence of mismatch repair deficiency. Chest CT 09/01/2020-no evidence of metastatic disease MRI pelvis 09/02/2020-tumor at 10.4 cm from anal verge, 5.9 cm from the internal anal sphincter, T3, approximately 15 greater than 5 mm lymph nodes, N2b, multiple  uterine fibroids Cycle 1 FOLFOX 09/15/2020 Cycle 2 FOLFOX 09/29/2020 Cycle 3 FOLFOX 10/13/2020 Cycle 4 FOLFOX 10/27/2020 Cycle 5 FOLFOX 11/10/2020 Cycle 6 FOLFOX 11/24/2020 Cycle 7 FOLFOX 12/08/2020 Rectal bleeding, change in bowel habits secondary to #1 Uterine fibroids noted on MRI pelvis 09/02/2020 Port-A-Cath placement 09/09/2020, [facility] admission 12/14/2020-E. coli bacteremia, febrile neutropenia  [name] was admitted for E. coli bacteremia and febrile neutropenia.  She was started on IV antibiotics.  This morning, she is feeling better and is no longer having fevers.  A repeat blood culture was drawn this morning and is pending. The positive blood culture was drawn through her Port-A-Cath.  We would not recommend removal of the Port-A-Cath and would recommend treating with IV antibiotics as already doing.  We will follow-up on sensitivities on the blood culture.  Her CBC from this morning was reviewed and her white blood cell count is now normal, but ANC is still low at 0.9.  We will plan to continue to monitor her CBC closely.  She was also noted to have transaminitis on her lab work and abdominal ultrasound was unremarkable.  An acute hepatitis panel has been ordered and is currently pending.  Transaminitis may be  related to medications.  Recommend close monitoring.  Recommendations:  1.  Continue IV antibiotics. 2.  Follow-up on blood culture sensitivities and repeat blood culture drawn this morning. 3.  Monitor CBC closely. 4.  Monitor transaminases closely.  Future Appointments  Date Time Provider [place]  12/22/2020 10:00 AM [facility] PHLEBOTOMIST [facility] None  12/22/2020 10:15 AM [facility] FLUSH ROOM [facility] None  12/22/2020 10:40 AM Ladell Pier, MD [facility] None  12/22/2020 11:00 AM [facility] CHAIR 2 [facility] None  01/05/2021  8:15 AM [facility] PHLEBOTOMIST [facility] None  01/05/2021  8:30 AM [facility] FLUSH ROOM [facility] None   01/05/2021  9:00 AM Ladell Pier, MD [facility] None  01/23/2021  2:30 PM [place], Raymond J, MD [facility]      LOS: 0 days   [name], DNP, AGPCNP-BC, AOCNP 12/15/20 [name] was interviewed and examined.  She is admitted with febrile neutropenia following recent FOLFOX chemotherapy.  She is now at day 8 following cycle 7 FOLFOX.  She felt weak when she presented to the emergency room on 12/13/2020.  She feels better today.  The neutrophil count has improved.  There is no indication for G-CSF at present.  We will add G-CSF with the next cycle of chemotherapy.  We are waiting on sensitivities on the positive blood culture.  No clear source for infection has been identified.  Port-A-Cath site does not appear infected.  The elevated liver enzymes could be related to bacteremia or chemotherapy.  I was present for greater than 50% of today's visit.  I performed medical decision making.  [name], MD

## 2020-12-15 NOTE — Discharge Planning (Signed)
Spoke w/patient to inform her that [name] and his NP, Erasmo Downer will follow her care in the hospital and I will assist in any discharge planning that is required. She added that she was feeling better today and appreciated the call.

## 2020-12-15 NOTE — Progress Notes (Signed)
No nausea PROGRESS NOTE    Sharon Bennett  VXB:[id number] DOB: September 17, 1980 DOA: 12/14/2020 PCP: de [place], Raymond J, MD    Chief Complaint  Patient presents with   Weakness    Brief Narrative:  Sharon Bennett is a 40 y.o. female with medical history significant of rectal cancer on chemotherapy, GERD seen in the ED 2 days ago for fever and fatigue.  She was discharged on Levaquin.  Blood culture came back positive for E. coli and Enterobacterales and patient advised to return to the ED.  Subjective: Feeling better, denies pain,  no nausea, no vomiting, no fever ,  Assessment & Plan:   Principal Problem:   Bacteremia Active Problems:   Rectal cancer (HCC)   Elevated transaminase level  Ecoli bacteremia  She was neutropenic on 11/8 She is improving, continue current antibiotics, awaiting for culture sensitivity result, repeat blood culture no growth Possible able to discharge on oral antibiotics as clinically she is improving  Transaminitis From  infection versus from chemotherapy Right upper quadrant ultrasound did not show any acute abnormalities Repeat LFT in the morning  Rectal cancer Management per oncology   Nutritional Assessment:  The patient's BMI is: Body mass index is 25.58 kg/m.Marland Kitchen      Unresulted Labs (From admission, onward)     Start     Ordered   12/16/20 0500  CBC with Differential  Tomorrow morning,   R        12/15/20 1720   12/16/20 0500  Comprehensive metabolic panel  Tomorrow morning,   R        12/15/20 1720   12/15/20 0534  Culture, blood (Routine X 2) w Reflex to ID Panel  Once,   R        12/15/20 0534   12/15/20 0520  Hepatitis panel, acute  Once,   R        12/15/20 0519   12/14/20 1725  Pathologist smear review  Once,   R        12/14/20 1725   12/14/20 1646  Urine Culture  Once,   R       Question:  Indication  Answer:  Sepsis   12/14/20 1645              DVT prophylaxis: enoxaparin (LOVENOX) injection 40 mg Start:  12/15/20 1000   Code Status: full Family Communication: patient Disposition:   Status is: Inpatient  Dispo: The patient is from: home              Anticipated d/c is to: home              Anticipated d/c date is: 24-48hrs, pending culture result                Consultants:  oncology  Procedures:  none  Antimicrobials:   Anti-infectives (From admission, onward)    Start     Dose/Rate Route Frequency Ordered Stop   12/14/20 1700  ceFEPIme (MAXIPIME) 2 g in sodium chloride 0.9 % 100 mL IVPB        2 g 200 mL/hr over 30 Minutes Intravenous Every 8 hours 12/14/20 1650             Objective: Vitals:   12/15/20 0400 12/15/20 1018 12/15/20 1230 12/15/20 1505  BP: 117/82 (!) 134/99 132/86 128/88  Pulse: 90 (!) 101 99 97  Resp:  16 18 16   Temp: 98.6 F (37 C) 98.3 F (36.8 C) 98.2 F (  36.8 C) 98 F (36.7 C)  TempSrc: Oral Oral Oral Oral  SpO2: 100% 100% 100% 98%  Weight:      Height:        Intake/Output Summary (Last 24 hours) at 12/15/2020 1731 Last data filed at 12/15/2020 1510 Gross per 24 hour  Intake 1680 ml  Output --  Net 1680 ml   Filed Weights   12/15/20 0031  Weight: 71.9 kg    Examination:  General exam: alert, awake, communicative,calm, NAD Respiratory system: Clear to auscultation. Respiratory effort normal. Cardiovascular system:  RRR.  Gastrointestinal system: Abdomen is nondistended, soft and nontender.  Normal bowel sounds heard. Central nervous system: Alert and oriented. No focal neurological deficits. Extremities:  no edema Skin: No rashes, lesions or ulcers Psychiatry: Judgement and insight appear normal. Mood & affect appropriate.     Data Reviewed: I have personally reviewed following labs and imaging studies  CBC: Recent Labs  Lab 12/13/20 0947 12/14/20 1725 12/15/20 0534  WBC 1.7* 2.8* 4.2  NEUTROABS 0.7* 0.7* 0.9*  HGB 12.6 13.7 12.1  HCT 39.1 42.5 37.6  MCV 83.9 83.3 85.5  PLT 125* 151 140*    Basic  Metabolic Panel: Recent Labs  Lab 12/13/20 0947 12/14/20 1725  NA 133* 134*  K 3.8 3.9  CL 100 103  CO2 23 21*  GLUCOSE 100* 108*  BUN 11 14  CREATININE 0.72 0.64  CALCIUM 8.7* 8.7*    GFR: Estimated Creatinine Clearance: 93.9 mL/min (by C-G formula based on SCr of 0.64 mg/dL).  Liver Function Tests: Recent Labs  Lab 12/13/20 0947 12/14/20 1725  AST 47* 133*  ALT 63* 165*  ALKPHOS 84 101  BILITOT 0.7 0.4  PROT 7.8 8.5*  ALBUMIN 4.4 4.6    CBG: No results for input(s): GLUCAP in the last 168 hours.   Recent Results (from the past 240 hour(s))  Urine Culture     Status: None   Collection Time: 12/13/20  8:49 AM   Specimen: Urine, Clean Catch  Result Value Ref Range Status   Specimen Description   Final    URINE, CLEAN CATCH Performed at [facility], [address], [place], [place] [address]    Special Requests   Final    NONE Performed at [facility], [address], [place], [place] [address]    Culture   Final    NO GROWTH Performed at [facility], [place] [address] [place], [place] [address]    Report Status 12/14/2020 FINAL  Final  Resp Panel by RT-PCR (Flu A&B, Covid) Nasopharyngeal Swab     Status: None   Collection Time: 12/13/20  8:50 AM   Specimen: Nasopharyngeal Swab; Nasopharyngeal(NP) swabs in vial transport medium  Result Value Ref Range Status   SARS Coronavirus 2 by RT PCR NEGATIVE NEGATIVE Final    Comment: (NOTE) SARS-CoV-2 target nucleic acids are NOT DETECTED.  The SARS-CoV-2 RNA is generally detectable in upper respiratory specimens during the acute phase of infection. The lowest concentration of SARS-CoV-2 viral copies this assay can detect is 138 copies/mL. A negative result does not preclude SARS-Cov-2 infection and should not be used as the sole basis for treatment or other patient management decisions. A negative result may occur with  improper specimen collection/handling,  submission of specimen other than nasopharyngeal swab, presence of viral mutation(s) within the areas targeted by this assay, and inadequate number of viral copies(<138 copies/mL). A negative result must be combined with clinical observations, patient history,  and epidemiological information. The expected result is Negative.  Fact Sheet for Patients:  EntrepreneurPulse.com.au  Fact Sheet for Healthcare Providers:  IncredibleEmployment.be  This test is no t yet approved or cleared by the [place] FDA and  has been authorized for detection and/or diagnosis of SARS-CoV-2 by FDA under an Emergency Use Authorization (EUA). This EUA will remain  in effect (meaning this test can be used) for the duration of the COVID-19 declaration under Section 564(b)(1) of the Act, 21 U.S.C.section 360bbb-3(b)(1), unless the authorization is terminated  or revoked sooner.       Influenza A by PCR NEGATIVE NEGATIVE Final   Influenza B by PCR NEGATIVE NEGATIVE Final    Comment: (NOTE) The Xpert Xpress SARS-CoV-2/FLU/RSV plus assay is intended as an aid in the diagnosis of influenza from Nasopharyngeal swab specimens and should not be used as a sole basis for treatment. Nasal washings and aspirates are unacceptable for Xpert Xpress SARS-CoV-2/FLU/RSV testing.  Fact Sheet for Patients: EntrepreneurPulse.com.au  Fact Sheet for Healthcare Providers: IncredibleEmployment.be  This test is not yet approved or cleared by the [place] FDA and has been authorized for detection and/or diagnosis of SARS-CoV-2 by FDA under an Emergency Use Authorization (EUA). This EUA will remain in effect (meaning this test can be used) for the duration of the COVID-19 declaration under Section 564(b)(1) of the Act, 21 U.S.C. section 360bbb-3(b)(1), unless the authorization is terminated or revoked.  Performed at [facility],  [address] [place], [place] [address]   Blood culture (routine x 2)     Status: Abnormal (Preliminary result)   Collection Time: 12/13/20  9:47 AM   Specimen: Porta Cath; Blood  Result Value Ref Range Status   Specimen Description   Final    PORTA CATH Performed at [facility], [address] [place], [place] [address]    Special Requests   Final    Blood Culture adequate volume Performed at [facility], [place], [place] [address]    Culture  Setup Time   Final    GRAM NEGATIVE RODS ANAEROBIC BOTTLE ONLY CRITICAL RESULT CALLED TO, READ BACK BY AND VERIFIED WITH: RN [facility] Bernita Raisin [id number] AT [id number] BY CM    Culture (A)  Final    ESCHERICHIA COLI SUSCEPTIBILITIES TO FOLLOW Performed at [facility], [place] [address], [place], [place] [address]    Report Status PENDING  Incomplete  Blood Culture ID Panel (Reflexed)     Status: Abnormal   Collection Time: 12/13/20  9:47 AM  Result Value Ref Range Status   Enterococcus faecalis NOT DETECTED NOT DETECTED Final   Enterococcus Faecium NOT DETECTED NOT DETECTED Final   Listeria monocytogenes NOT DETECTED NOT DETECTED Final   Staphylococcus species NOT DETECTED NOT DETECTED Final   Staphylococcus aureus (BCID) NOT DETECTED NOT DETECTED Final   Staphylococcus epidermidis NOT DETECTED NOT DETECTED Final   Staphylococcus lugdunensis NOT DETECTED NOT DETECTED Final   Streptococcus species NOT DETECTED NOT DETECTED Final   Streptococcus agalactiae NOT DETECTED NOT DETECTED Final   Streptococcus pneumoniae NOT DETECTED NOT DETECTED Final   Streptococcus pyogenes NOT DETECTED NOT DETECTED Final   A.calcoaceticus-baumannii NOT DETECTED NOT DETECTED Final   Bacteroides fragilis NOT DETECTED NOT DETECTED Final   Enterobacterales DETECTED (A) NOT DETECTED Final    Comment: Enterobacterales represent a large order of gram negative bacteria, not a single organism. CRITICAL RESULT  CALLED TO, READ BACK BY AND VERIFIED WITH: RN J  BAILEY [id number] AT [id number] BY CM    Enterobacter cloacae complex NOT DETECTED NOT DETECTED Final   Escherichia coli DETECTED (A) NOT DETECTED Final    Comment: CRITICAL RESULT CALLED TO, READ BACK BY AND VERIFIED WITH: RN J BAILEY [facility] [id number] AT [id number] BY CM    Klebsiella aerogenes NOT DETECTED NOT DETECTED Final   Klebsiella oxytoca NOT DETECTED NOT DETECTED Final   Klebsiella pneumoniae NOT DETECTED NOT DETECTED Final   Proteus species NOT DETECTED NOT DETECTED Final   Salmonella species NOT DETECTED NOT DETECTED Final   Serratia marcescens NOT DETECTED NOT DETECTED Final   Haemophilus influenzae NOT DETECTED NOT DETECTED Final   Neisseria meningitidis NOT DETECTED NOT DETECTED Final   Pseudomonas aeruginosa NOT DETECTED NOT DETECTED Final   Stenotrophomonas maltophilia NOT DETECTED NOT DETECTED Final   Candida albicans NOT DETECTED NOT DETECTED Final   Candida auris NOT DETECTED NOT DETECTED Final   Candida glabrata NOT DETECTED NOT DETECTED Final   Candida krusei NOT DETECTED NOT DETECTED Final   Candida parapsilosis NOT DETECTED NOT DETECTED Final   Candida tropicalis NOT DETECTED NOT DETECTED Final   Cryptococcus neoformans/gattii NOT DETECTED NOT DETECTED Final   CTX-M ESBL NOT DETECTED NOT DETECTED Final   Carbapenem resistance IMP NOT DETECTED NOT DETECTED Final   Carbapenem resistance KPC NOT DETECTED NOT DETECTED Final   Carbapenem resistance NDM NOT DETECTED NOT DETECTED Final   Carbapenem resist OXA 48 LIKE NOT DETECTED NOT DETECTED Final   Carbapenem resistance VIM NOT DETECTED NOT DETECTED Final    Comment: Performed at [facility], [address], [place], [place] [address]  Blood culture (routine x 2)     Status: None (Preliminary result)   Collection Time: 12/13/20 11:20 AM   Specimen: Right Antecubital; Blood  Result Value Ref Range Status   Specimen Description   Final    RIGHT ANTECUBITAL Performed at [facility], [address] [place], [place] [address]    Special Requests   Final    Blood Culture results may not be optimal due to an inadequate volume of blood received in culture bottles Performed at [facility], [address], [place], [place] [address]    Culture   Final    NO GROWTH 2 DAYS Performed at [facility], [place] [address], [place], [place] [address]    Report Status PENDING  Incomplete  Blood culture (routine x 2)     Status: None (Preliminary result)   Collection Time: 12/14/20  5:26 PM   Specimen: BLOOD  Result Value Ref Range Status   Specimen Description   Final    BLOOD BLOOD RIGHT FOREARM Performed at [facility], [address] [place], [place] [address]    Special Requests   Final    BOTTLES DRAWN AEROBIC AND ANAEROBIC Blood Culture adequate volume Performed at [facility], [address], [place], [place] [address]    Culture   Final    NO GROWTH < 12 HOURS Performed at [facility] [address], [place], [place] [address]    Report Status PENDING  Incomplete         Radiology Studies: US Abdomen Limited RUQ (LIVER/GB)  Result Date: 12/15/2020 CLINICAL DATA:  Elevated liver function studies. EXAM: ULTRASOUND ABDOMEN LIMITED RIGHT UPPER QUADRANT COMPARISON:  CT scan 08/21/2020 FINDINGS: Gallbladder: No gallstones or wall thickening visualized. No sonographic Murphy sign noted by sonographer. Common bile duct: Diameter: 4.0 mm  Liver: Normal echogenicity without focal lesion or biliary dilatation. Portal vein is patent on color Doppler imaging with normal direction of blood flow towards the liver. Other: None. IMPRESSION: Unremarkable right upper quadrant ultrasound examination. Electronically Signed   By: Marijo Sanes M.D.   On: 12/15/2020 08:10        Scheduled Meds:  enoxaparin (LOVENOX) injection  40 mg Subcutaneous Q24H   melatonin  3 mg Oral QHS    polyethylene glycol  17 g Oral Daily   Continuous Infusions:  sodium chloride 125 mL/hr at 12/15/20 1511   ceFEPime (MAXIPIME) IV 2 g (12/15/20 1020)     LOS: 0 days   Time spent: 54mins Greater than 50% of this time was spent in counseling, explanation of diagnosis, planning of further management, and coordination of care.   Voice Recognition Viviann Spare dictation system was used to create this note, attempts have been made to correct errors. Please contact the author with questions and/or clarifications.   Florencia Reasons, MD PhD FACP [facility]  Available via Epic secure chat 7am-7pm for nonurgent issues Please page for urgent issues To page the attending provider between 7A-7P or the covering provider during after hours 7P-7A, please log into the web site [email or web address] and access using universal [place] password for that web site. If you do not have the password, please call the hospital operator.    12/15/2020, 5:31 PM

## 2020-12-15 NOTE — H&P (Signed)
History and Physical    Sharon Bennett GDJ:[id number] DOB: 09/13/1980 DOA: 12/14/2020  PCP: de [place], Raymond J, MD Patient coming from: Home  Chief Complaint: Positive blood culture  HPI: Sharon Bennett is a 40 y.o. female with medical history significant of rectal cancer on chemotherapy, GERD seen in the ED 2 days ago for fever and fatigue.  She was discharged on Levaquin.  Blood culture came back positive for E. coli and Enterobacterales and patient advised to return to the ED.  Slightly tachycardic.  Afebrile.  Labs showing WBC 2.8 (was 1.7 on labs done 2 days ago), hemoglobin 13.7, platelet count 151k.  Sodium 134, potassium 3.9, chloride 103, bicarb 21, BUN 14, creatinine 0.6, glucose 108.  AST 133, ALT 165.  Alk phos and T bili normal.  Lactic acid normal x2.  Repeat blood cultures drawn.  UA without signs of infection.  Chest x-ray done 2 days ago without signs of pneumonia. Patient was given cefepime and 1 L normal saline.  Patient states she finished her seventh round of chemotherapy on Saturday.  On Monday she spiked a fever and was seen in the emergency room.  After leaving the emergency room, she had fever again.  Reports ongoing fatigue and abdominal cramping which she attributes to chemotherapy.  Denies cough, shortness of breath, or chest pain.  Denies vomiting or diarrhea.  Denies dysuria.  No other complaints.  Review of Systems:  All systems reviewed and apart from history of presenting illness, are negative.  Past Medical History:  Diagnosis Date   Colorectal cancer ([place])    Family history of brain cancer    Family history of breast cancer    Family history of pancreatic cancer     Past Surgical History:  Procedure Laterality Date   IR IMAGING GUIDED PORT INSERTION  09/09/2020     reports that she has never smoked. She has never used smokeless tobacco. No history on file for alcohol use and drug use.  Allergies  Allergen Reactions   Codeine Nausea And Vomiting    Tegaderm Ag Mesh [Silver] Rash    Allergic reaction to Tegaderm dressing    Family History  Problem Relation Age of Onset   Hypertension Mother    Heart failure Father    Hypertension Father    Pancreatic cancer Maternal Grandfather        dx 52s   Brain cancer Paternal Grandmother        dx >50   Breast cancer Other 46       MGF's sister    Prior to Admission medications   Medication Sig Start Date End Date Taking? Authorizing Provider  fluconazole (DIFLUCAN) 100 MG tablet Take 1 tablet (100 mg total) by mouth daily. 12/08/20   Ladell Pier, MD  levofloxacin (LEVAQUIN) 500 MG tablet Take 1 tablet (500 mg total) by mouth daily for 6 days. 12/14/20 12/20/20  Wyvonnia Dusky, MD  lidocaine-prilocaine (EMLA) cream Apply 1 application topically as needed. Apply 1/2 tablespoon to port site and cover with plastic wrap 2 hours prior to stick to numb site Patient not taking: No sig reported 09/23/20   Ladell Pier, MD  melatonin 3 MG TABS tablet Take 3 mg by mouth at bedtime. Patient not taking: No sig reported    [provider]  ondansetron (ZOFRAN) 8 MG tablet Take 1 tablet (8 mg total) by mouth every 8 (eight) hours as needed for nausea or vomiting. Patient not taking: No sig  reported 09/12/20   Ladell Pier, MD  pantoprazole (PROTONIX) 40 MG tablet TAKE 1 TABLET BY MOUTH EVERY DAY 10/27/20   Ladell Pier, MD  prochlorperazine (COMPAZINE) 10 MG tablet Take 1 tablet (10 mg total) by mouth every 6 (six) hours as needed for nausea or vomiting. 09/12/20   Ladell Pier, MD  traMADol (ULTRAM) 50 MG tablet Take 1 tablet (50 mg total) by mouth every 6 (six) hours as needed for moderate pain. Patient not taking: No sig reported 09/29/20   Ladell Pier, MD    Physical Exam: Vitals:   12/14/20 2100 12/14/20 2200 12/15/20 0031 12/15/20 0400  BP: (!) 129/92 (!) 124/100 (!) 139/98 117/82  Pulse: 95 (!) 103 100 90  Resp: _0 Temp:   99 F (37.2 C) 98.6 F (37 C)   TempSrc:   Oral Oral  SpO2: 100% 100% 100% 100%  Weight:   71.9 kg   Height:   5' 6" (1.676 m)     Physical Exam Constitutional:      General: She is not in acute distress. HENT:     Head: Normocephalic and atraumatic.  Eyes:     Extraocular Movements: Extraocular movements intact.     Conjunctiva/sclera: Conjunctivae normal.  Cardiovascular:     Rate and Rhythm: Normal rate and regular rhythm.     Pulses: Normal pulses.  Pulmonary:     Effort: Pulmonary effort is normal. No respiratory distress.     Breath sounds: Normal breath sounds. No wheezing or rales.  Abdominal:     General: Bowel sounds are normal. There is no distension.     Palpations: Abdomen is soft.     Tenderness: There is no abdominal tenderness.  Musculoskeletal:        General: No swelling or tenderness.     Cervical back: Normal range of motion and neck supple.  Skin:    General: Skin is warm and dry.  Neurological:     General: No focal deficit present.     Mental Status: She is alert and oriented to person, place, and time.     Labs on Admission: I have personally reviewed following labs and imaging studies  CBC: Recent Labs  Lab 12/08/20 0846 12/13/20 0947 12/14/20 1725  WBC 5.2 1.7* 2.8*  NEUTROABS 1.6* 0.7* 0.7*  HGB 11.2* 12.6 13.7  HCT 35.2* 39.1 42.5  MCV 84.8 83.9 83.3  PLT 168 125* 767   Basic Metabolic Panel: Recent Labs  Lab 12/08/20 0846 12/13/20 0947 12/14/20 1725  NA 138 133* 134*  K 3.6 3.8 3.9  CL 105 100 103  CO2 26 23 21*  GLUCOSE 98 100* 108*  BUN _1 CREATININE 0.61 0.72 0.64  CALCIUM 8.6* 8.7* 8.7*   GFR: Estimated Creatinine Clearance: 93.9 mL/min (by C-G formula based on SCr of 0.64 mg/dL). Liver Function Tests: Recent Labs  Lab 12/08/20 0846 12/13/20 0947 12/14/20 1725  AST 26 47* 133*  ALT 36 63* 165*  ALKPHOS 62 84 101  BILITOT 0.5 0.7 0.4  PROT 7.0 7.8 8.5*  ALBUMIN 3.9 4.4 4.6   No results for input(s): LIPASE, AMYLASE in the last 168  hours. No results for input(s): AMMONIA in the last 168 hours. Coagulation Profile: No results for input(s): INR, PROTIME in the last 168 hours. Cardiac Enzymes: No results for input(s): CKTOTAL, CKMB, CKMBINDEX, TROPONINI in the last 168 hours. BNP (last 3 results) No results for input(s): PROBNP in  the last 8760 hours. HbA1C: No results for input(s): HGBA1C in the last 72 hours. CBG: No results for input(s): GLUCAP in the last 168 hours. Lipid Profile: No results for input(s): CHOL, HDL, LDLCALC, TRIG, CHOLHDL, LDLDIRECT in the last 72 hours. Thyroid Function Tests: No results for input(s): TSH, T4TOTAL, FREET4, T3FREE, THYROIDAB in the last 72 hours. Anemia Panel: No results for input(s): VITAMINB12, FOLATE, FERRITIN, TIBC, IRON, RETICCTPCT in the last 72 hours. Urine analysis:    Component Value Date/Time   COLORURINE YELLOW 12/14/2020 1924   APPEARANCEUR CLEAR 12/14/2020 1924   LABSPEC 1.027 12/14/2020 1924   PHURINE 6.0 12/14/2020 1924   GLUCOSEU NEGATIVE 12/14/2020 1924   HGBUR SMALL (A) 12/14/2020 1924   BILIRUBINUR NEGATIVE 12/14/2020 [place] NEGATIVE 12/14/2020 1924   PROTEINUR 100 (A) 12/14/2020 1924   NITRITE NEGATIVE 12/14/2020 1924   LEUKOCYTESUR NEGATIVE 12/14/2020 1924    Radiological Exams on Admission: DG Chest Portable 1 View  Result Date: 12/13/2020 CLINICAL DATA:  Fever, on chemotherapy EXAM: PORTABLE CHEST 1 VIEW COMPARISON:  None. FINDINGS: The heart size and mediastinal contours are within normal limits. Both lungs are clear. No pleural effusion. Right chest wall port catheter tip overlies SVC the visualized skeletal structures are unremarkable. IMPRESSION: No acute process in the chest. Electronically Signed   By: Macy Mis M.D.   On: 12/13/2020 09:06    Assessment/Plan Principal Problem:   Bacteremia Active Problems:   Rectal cancer (HCC)   Elevated transaminase level   E. coli and Enterobacterales bacteremia Possible sepsis Has  a Port-A-Cath for chemo which is the most likely source of infection.  Blood culture drawn 2 days ago positive for E. coli and Enterobacterales. Leukopenic with WBC 2.8 (was 1.7 on labs done 2 days ago).  Lactic acid normal x2.  Slight tachycardia improved after 1 L fluid bolus.  Not hypotensive.  Not febrile at present.  UA without signs of infection.  Chest x-ray done 2 days ago without signs of pneumonia. -Continue cefepime and IV fluid hydration.  Repeat blood cultures drawn.  Monitor WBC count.  Consult ID in the morning.  Rectal cancer Currently on chemotherapy. -Please notify oncology in the morning that the patient is admitted.  Elevated transaminases Possibly related to chemo, however, transaminases were normal a week ago. -Hepatitis panel, right upper quadrant ultrasound  DVT prophylaxis: Lovenox Code Status: Full code Family Communication: No family available at this time. Disposition Plan: Status is: Inpatient  Remains inpatient appropriate because: Bacteremia  Level of care: Level of care: Med-Surg  The medical decision making on this patient was of high complexity and the patient is at high risk for clinical deterioration, therefore this is a level 3 visit.  Shela Leff MD [facility]  If 7PM-7AM, please contact night-coverage [email or web address]  12/15/2020, 5:22 AM

## 2020-12-16 ENCOUNTER — Other Ambulatory Visit (HOSPITAL_COMMUNITY): Payer: Self-pay

## 2020-12-16 ENCOUNTER — Encounter: Payer: Self-pay | Admitting: Oncology

## 2020-12-16 ENCOUNTER — Other Ambulatory Visit: Payer: Self-pay | Admitting: *Deleted

## 2020-12-16 DIAGNOSIS — R7881 Bacteremia: Secondary | ICD-10-CM | POA: Diagnosis not present

## 2020-12-16 LAB — QUANTIFERON-TB GOLD PLUS: QuantiFERON-TB Gold Plus: NEGATIVE

## 2020-12-16 LAB — CBC WITH DIFFERENTIAL/PLATELET
Abs Immature Granulocytes: 0.01 10*3/uL (ref 0.00–0.07)
Basophils Absolute: 0 10*3/uL (ref 0.0–0.1)
Basophils Relative: 0 %
Eosinophils Absolute: 0.1 10*3/uL (ref 0.0–0.5)
Eosinophils Relative: 1 %
HCT: 32.7 % — ABNORMAL LOW (ref 36.0–46.0)
Hemoglobin: 10.6 g/dL — ABNORMAL LOW (ref 12.0–15.0)
Immature Granulocytes: 0 %
Lymphocytes Relative: 68 %
Lymphs Abs: 3.3 10*3/uL (ref 0.7–4.0)
MCH: 27.2 pg (ref 26.0–34.0)
MCHC: 32.4 g/dL (ref 30.0–36.0)
MCV: 83.8 fL (ref 80.0–100.0)
Monocytes Absolute: 0.7 10*3/uL (ref 0.1–1.0)
Monocytes Relative: 15 %
Neutro Abs: 0.8 10*3/uL — ABNORMAL LOW (ref 1.7–7.7)
Neutrophils Relative %: 16 %
Platelets: 137 10*3/uL — ABNORMAL LOW (ref 150–400)
RBC: 3.9 MIL/uL (ref 3.87–5.11)
RDW: 16.8 % — ABNORMAL HIGH (ref 11.5–15.5)
WBC: 4.9 10*3/uL (ref 4.0–10.5)
nRBC: 0 % (ref 0.0–0.2)

## 2020-12-16 LAB — COMPREHENSIVE METABOLIC PANEL
ALT: 95 U/L — ABNORMAL HIGH (ref 0–44)
AST: 55 U/L — ABNORMAL HIGH (ref 15–41)
Albumin: 3.4 g/dL — ABNORMAL LOW (ref 3.5–5.0)
Alkaline Phosphatase: 73 U/L (ref 38–126)
Anion gap: 7 (ref 5–15)
BUN: 8 mg/dL (ref 6–20)
CO2: 22 mmol/L (ref 22–32)
Calcium: 8.4 mg/dL — ABNORMAL LOW (ref 8.9–10.3)
Chloride: 109 mmol/L (ref 98–111)
Creatinine, Ser: 0.52 mg/dL (ref 0.44–1.00)
GFR, Estimated: >60 mL/min (ref >60)
Glucose, Bld: 100 mg/dL — ABNORMAL HIGH (ref 70–99)
Potassium: 3.5 mmol/L (ref 3.5–5.1)
Sodium: 138 mmol/L (ref 135–145)
Total Bilirubin: 0.2 mg/dL — ABNORMAL LOW (ref 0.3–1.2)
Total Protein: 6.7 g/dL (ref 6.5–8.1)

## 2020-12-16 LAB — CULTURE, BLOOD (ROUTINE X 2): Special Requests: ADEQUATE

## 2020-12-16 LAB — QUANTIFERON-TB GOLD PLUS (RQFGPL)
QuantiFERON Mitogen Value: >10 IU/mL
QuantiFERON Nil Value: 0.04 IU/mL
QuantiFERON TB1 Ag Value: 0.05 IU/mL
QuantiFERON TB2 Ag Value: 0.05 IU/mL

## 2020-12-16 LAB — HEPATITIS PANEL, ACUTE
HCV Ab: NONREACTIVE
Hep A IgM: NONREACTIVE
Hep B C IgM: NONREACTIVE
Hepatitis B Surface Ag: NONREACTIVE

## 2020-12-16 MED ORDER — AMOXICILLIN-POT CLAVULANATE 875-125 MG PO TABS
1.0000 | ORAL_TABLET | Freq: Two times a day (BID) | ORAL | Status: DC
  Administered 2020-12-16: 1 via ORAL
  Filled 2020-12-16: qty 1

## 2020-12-16 MED ORDER — AMOXICILLIN-POT CLAVULANATE 875-125 MG PO TABS
1.0000 | ORAL_TABLET | Freq: Two times a day (BID) | ORAL | 0 refills | Status: AC
Start: 2020-12-16 — End: 2020-12-23

## 2020-12-16 MED ORDER — PEGFILGRASTIM-BMEZ 6 MG/0.6ML ~~LOC~~ SOSY
6.0000 mg | PREFILLED_SYRINGE | Freq: Once | SUBCUTANEOUS | 0 refills | Status: AC
  Filled 2020-12-16 (×2): qty 0.6, 1d supply, fill #0

## 2020-12-16 NOTE — Discharge Summary (Signed)
Discharge Summary  Sharon Bennett NOI:[id number] DOB: 1980/04/28  PCP: [place], Sharon Bennett  Admit date: 12/14/2020 Discharge date: 12/16/2020  Time spent: 65mns, more than 50% time spent on coordination of care.  Case discussed with oncology/ID and ID pharmacist prior to discharge  Recommendations for Outpatient Follow-up:  F/u with oncology  within a week  for hospital discharge follow up, repeat cbc/cmp at follow up  Please follow-up on final hepatitis panel result  Discharge Diagnoses:  Active Hospital Problems   Diagnosis Date Noted   Bacteremia 12/15/2020   Elevated transaminase level 12/15/2020   [place]al cancer ([facility] 09/05/2020    Resolved Hospital Problems  No resolved problems to display.    Discharge Condition: stable  Diet recommendation: Regular diet  Filed Weights   12/15/20 0031  Weight: 71.9 kg    History of present illness: (Per admitting Bennett Dr. VBeau Fanny Chief Complaint: Positive blood culture   HPI: EEBANY BOWERMASTERis a 40y.o. female with medical history significant of rectal cancer on chemotherapy, GERD seen in the ED 2 days ago for fever and fatigue.  She was discharged on Levaquin.  Blood culture came back positive for E. coli and Enterobacterales and patient advised to return to the ED.  Slightly tachycardic.  Afebrile.  Labs showing WBC 2.8 (was 1.7 on labs done 2 days ago), hemoglobin 13.7, platelet count 151k.  Sodium 134, potassium 3.9, chloride 103, bicarb 21, BUN 14, creatinine 0.6, glucose 108.  AST 133, ALT 165.  Alk phos and T bili normal.  Lactic acid normal x2.  Repeat blood cultures drawn.  UA without signs of infection.  Chest x-ray done 2 days ago without signs of pneumonia. Patient was given cefepime and 1 L normal saline.   Patient states she finished her seventh round of chemotherapy on Saturday.  On Monday she spiked a fever and was seen in the emergency room.  After leaving the emergency room, she had fever again.  Reports ongoing  fatigue and abdominal cramping which she attributes to chemotherapy.  Denies cough, shortness of breath, or chest pain.  Denies vomiting or diarrhea.  Denies dysuria.  No other complaints.  Hospital Course:  Principal Problem:   Bacteremia Active Problems:   [place]al cancer ([facility]   Elevated transaminase level  Ecoli bacteremia  She was neutropenic on 11/8, wbc normalized E. coli is pansensitive, she received 3 days of IV cefepime in hospital, she is improving feeling better, repeat blood culture no growth so far  case discussed with ID/ID pharmacy/oncology plan to discharge patient home on 7 days of Augmentin to finish total 10 days antibiotic treatment.   Transaminitis From  infection versus from chemotherapy Right upper quadrant ultrasound did not show any acute abnormalities Hepatitis panel partially resulted, negative for hep a hep B, hep C antibody still pending, need to follow-up on outpatient basis LFT trending down, denies abdominal pain, no nausea no vomiting   Rectal cancer Management per oncology     Body mass index is 25.58 kg/m..Marland Kitchen Procedures: None  Consultations: Phone conversation with ID/ID pharmacy/oncology  Discharge Exam: BP 121/75 (BP Location: Left Arm)   Pulse 87   Temp 98.3 F (36.8 C) (Oral)   Resp 17   Ht _0  (1.676 m)   Wt 71.9 kg   SpO2 99%   BMI 25.58 kg/m   General: NAD Cardiovascular: RRR Respiratory: Normal respiratory effort  Discharge Instructions You were cared for by a hospitalist during your hospital stay. If  you have any questions about your discharge medications or the care you received while you were in the hospital after you are discharged, you can call the unit and asked to speak with the hospitalist on call if the hospitalist that took care of you is not available. Once you are discharged, your primary care physician will handle any further medical issues. Please note that NO REFILLS for any discharge medications will be  authorized once you are discharged, as it is imperative that you return to your primary care physician (or establish a relationship with a primary care physician if you do not have one) for your aftercare needs so that they can reassess your need for medications and monitor your lab values.  Discharge Instructions     Diet general   Complete by: As directed    Increase activity slowly   Complete by: As directed       Allergies as of 12/16/2020       Reactions   Codeine Nausea And Vomiting   Tegaderm Ag Mesh [silver] Rash   Allergic reaction to Tegaderm dressing        Medication List     STOP taking these medications    fluconazole 100 MG tablet Commonly known as: DIFLUCAN   levofloxacin 500 MG tablet Commonly known as: LEVAQUIN       TAKE these medications    acetaminophen 500 MG tablet Commonly known as: TYLENOL Take 1,000 mg by mouth every 6 (six) hours as needed for mild pain or headache.   amoxicillin-clavulanate 875-125 MG tablet Commonly known as: AUGMENTIN Take 1 tablet by mouth every 12 (twelve) hours for 7 days.   aspirin-acetaminophen-caffeine 250-250-65 MG tablet Commonly known as: EXCEDRIN MIGRAINE Take 1 tablet by mouth every 6 (six) hours as needed for headache.   lidocaine-prilocaine cream Commonly known as: EMLA Apply 1 application topically as needed. Apply 1/2 tablespoon to port site and cover with plastic wrap 2 hours prior to stick to numb site   melatonin 5 MG Tabs Take 5 mg by mouth at bedtime as needed (sleep).   ondansetron 8 MG tablet Commonly known as: ZOFRAN Take 1 tablet (8 mg total) by mouth every 8 (eight) hours as needed for nausea or vomiting.   pantoprazole 40 MG tablet Commonly known as: PROTONIX TAKE 1 TABLET BY MOUTH EVERY DAY What changed:  when to take this reasons to take this   prochlorperazine 10 MG tablet Commonly known as: COMPAZINE Take 1 tablet (10 mg total) by mouth every 6 (six) hours as needed for  nausea or vomiting.   Slynd 4 MG Tabs Generic drug: Drospirenone Take 1 tablet by mouth daily.       Allergies  Allergen Reactions   Codeine Nausea And Vomiting   Tegaderm Ag Mesh [Silver] Rash    Allergic reaction to Tegaderm dressing    Follow-up Information     [place], Blondell Reveal, Bennett Follow up.   Specialty: Family Medicine Contact information: [place] [address] [phone]         Ladell Pier, Bennett Follow up.   Specialty: Oncology Why: hospital discharge follow up, repreat cbc/cmp at follow up Contact information: [place] [address] [phone]                  The results of significant diagnostics from this hospitalization (including imaging, microbiology, ancillary and laboratory) are listed below for reference.    Significant Diagnostic Studies: DG Chest  Portable 1 View  Result Date: 12/13/2020 CLINICAL DATA:  Fever, on chemotherapy EXAM: PORTABLE CHEST 1 VIEW COMPARISON:  None. FINDINGS: The heart size and mediastinal contours are within normal limits. Both lungs are clear. No pleural effusion. Right chest wall port catheter tip overlies SVC the visualized skeletal structures are unremarkable. IMPRESSION: No acute process in the chest. Electronically Signed   By: Macy Mis M.D.   On: 12/13/2020 09:06   US Abdomen Limited RUQ (LIVER/GB)  Result Date: 12/15/2020 CLINICAL DATA:  Elevated liver function studies. EXAM: ULTRASOUND ABDOMEN LIMITED RIGHT UPPER QUADRANT COMPARISON:  CT scan 08/21/2020 FINDINGS: Gallbladder: No gallstones or wall thickening visualized. No sonographic Murphy sign noted by sonographer. Common bile duct: Diameter: 4.0 mm Liver: Normal echogenicity without focal lesion or biliary dilatation. Portal vein is patent on color Doppler imaging with normal direction of blood flow towards the liver. Other: None. IMPRESSION: Unremarkable right upper quadrant ultrasound examination.  Electronically Signed   By: Marijo Sanes M.D.   On: 12/15/2020 08:10    Microbiology: Recent Results (from the past 240 hour(s))  Urine Culture     Status: None   Collection Time: 12/13/20  8:49 AM   Specimen: Urine, Clean Catch  Result Value Ref Range Status   Specimen Description   Final    URINE, CLEAN CATCH Performed at [facility], [address] [place], [place] [address]    Special Requests   Final    NONE Performed at [facility], [address] [place], [place] [address]    Culture   Final    NO GROWTH Performed at [facility], [place] [address] [place], [place] [address]    Report Status 12/14/2020 FINAL  Final  Resp Panel by RT-PCR (Flu A&B, Covid) Nasopharyngeal Swab     Status: None   Collection Time: 12/13/20  8:50 AM   Specimen: Nasopharyngeal Swab; Nasopharyngeal(NP) swabs in vial transport medium  Result Value Ref Range Status   SARS Coronavirus 2 by RT PCR NEGATIVE NEGATIVE Final    Comment: (NOTE) SARS-CoV-2 target nucleic acids are NOT DETECTED.  The SARS-CoV-2 RNA is generally detectable in upper respiratory specimens during the acute phase of infection. The lowest concentration of SARS-CoV-2 viral copies this assay can detect is 138 copies/mL. A negative result does not preclude SARS-Cov-2 infection and should not be used as the sole basis for treatment or other patient management decisions. A negative result may occur with  improper specimen collection/handling, submission of specimen other than nasopharyngeal swab, presence of viral mutation(s) within the areas targeted by this assay, and inadequate number of viral copies(<138 copies/mL). A negative result must be combined with clinical observations, patient history, and epidemiological information. The expected result is Negative.  Fact Sheet for Patients:  EntrepreneurPulse.com.au  Fact Sheet for Healthcare Providers:   IncredibleEmployment.be  This test is no t yet approved or cleared by the [place] FDA and  has been authorized for detection and/or diagnosis of SARS-CoV-2 by FDA under an Emergency Use Authorization (EUA). This EUA will remain  in effect (meaning this test can be used) for the duration of the COVID-19 declaration under Section 564(b)(1) of the Act, 21 U.S.C.section 360bbb-3(b)(1), unless the authorization is terminated  or revoked sooner.       Influenza A by PCR NEGATIVE NEGATIVE Final   Influenza B by PCR NEGATIVE NEGATIVE Final    Comment: (NOTE) The Xpert Xpress SARS-CoV-2/FLU/RSV plus assay is intended as an aid in the diagnosis  of influenza from Nasopharyngeal swab specimens and should not be used as a sole basis for treatment. Nasal washings and aspirates are unacceptable for Xpert Xpress SARS-CoV-2/FLU/RSV testing.  Fact Sheet for Patients: EntrepreneurPulse.com.au  Fact Sheet for Healthcare Providers: IncredibleEmployment.be  This test is not yet approved or cleared by the [place] FDA and has been authorized for detection and/or diagnosis of SARS-CoV-2 by FDA under an Emergency Use Authorization (EUA). This EUA will remain in effect (meaning this test can be used) for the duration of the COVID-19 declaration under Section 564(b)(1) of the Act, 21 U.S.C. section 360bbb-3(b)(1), unless the authorization is terminated or revoked.  Performed at [facility], [address] [place], [place] [address]   Blood culture (routine x 2)     Status: Abnormal   Collection Time: 12/13/20  9:47 AM   Specimen: Porta Cath; Blood  Result Value Ref Range Status   Specimen Description   Final    PORTA CATH Performed at [facility], [address] [place], [place] [address]    Special Requests   Final    Blood Culture adequate volume Performed at [facility], [place], [place] [address]    Culture  Setup Time   Final    GRAM NEGATIVE RODS ANAEROBIC BOTTLE ONLY CRITICAL RESULT CALLED TO, READ BACK BY AND VERIFIED WITH: RN [facility] Bernita Raisin [id number] AT 3664 BY CM Performed at [facility], [place] [address] [place], [place] [address]    Culture ESCHERICHIA COLI (A)  Final   Report Status 12/16/2020 FINAL  Final   Organism ID, Bacteria ESCHERICHIA COLI  Final      Susceptibility   Escherichia coli - MIC*    AMPICILLIN <=2 SENSITIVE Sensitive     CEFAZOLIN <=4 SENSITIVE Sensitive     CEFEPIME <=0.12 SENSITIVE Sensitive     CEFTAZIDIME <=1 SENSITIVE Sensitive     CEFTRIAXONE <=0.25 SENSITIVE Sensitive     CIPROFLOXACIN <=0.25 SENSITIVE Sensitive     GENTAMICIN <=1 SENSITIVE Sensitive     IMIPENEM <=0.25 SENSITIVE Sensitive     TRIMETH/SULFA <=20 SENSITIVE Sensitive     AMPICILLIN/SULBACTAM <=2 SENSITIVE Sensitive     PIP/TAZO <=4 SENSITIVE Sensitive     * ESCHERICHIA COLI  Blood Culture ID Panel (Reflexed)     Status: Abnormal   Collection Time: 12/13/20  9:47 AM  Result Value Ref Range Status   Enterococcus faecalis NOT DETECTED NOT DETECTED Final   Enterococcus Faecium NOT DETECTED NOT DETECTED Final   Listeria monocytogenes NOT DETECTED NOT DETECTED Final   Staphylococcus species NOT DETECTED NOT DETECTED Final   Staphylococcus aureus (BCID) NOT DETECTED NOT DETECTED Final   Staphylococcus epidermidis NOT DETECTED NOT DETECTED Final   Staphylococcus lugdunensis NOT DETECTED NOT DETECTED Final   Streptococcus species NOT DETECTED NOT DETECTED Final   Streptococcus agalactiae NOT DETECTED NOT DETECTED Final   Streptococcus pneumoniae NOT DETECTED NOT DETECTED Final   Streptococcus pyogenes NOT DETECTED NOT DETECTED Final   A.calcoaceticus-baumannii NOT DETECTED NOT DETECTED Final   Bacteroides fragilis NOT DETECTED NOT DETECTED Final   Enterobacterales DETECTED (A) NOT DETECTED Final    Comment:  Enterobacterales represent a large order of gram negative bacteria, not a single organism. CRITICAL RESULT CALLED TO, READ BACK BY AND VERIFIED WITH: RN Bernita Raisin [id number] AT 3875 BY CM    Enterobacter cloacae complex NOT DETECTED NOT DETECTED Final   Escherichia coli DETECTED (A) NOT DETECTED Final  Comment: CRITICAL RESULT CALLED TO, READ BACK BY AND VERIFIED WITH: RN Bernita Raisin [facility] [id number] AT 0569 BY CM    Klebsiella aerogenes NOT DETECTED NOT DETECTED Final   Klebsiella oxytoca NOT DETECTED NOT DETECTED Final   Klebsiella pneumoniae NOT DETECTED NOT DETECTED Final   Proteus species NOT DETECTED NOT DETECTED Final   Salmonella species NOT DETECTED NOT DETECTED Final   Serratia marcescens NOT DETECTED NOT DETECTED Final   Haemophilus influenzae NOT DETECTED NOT DETECTED Final   Neisseria meningitidis NOT DETECTED NOT DETECTED Final   Pseudomonas aeruginosa NOT DETECTED NOT DETECTED Final   Stenotrophomonas maltophilia NOT DETECTED NOT DETECTED Final   Candida albicans NOT DETECTED NOT DETECTED Final   Candida auris NOT DETECTED NOT DETECTED Final   Candida glabrata NOT DETECTED NOT DETECTED Final   Candida krusei NOT DETECTED NOT DETECTED Final   Candida parapsilosis NOT DETECTED NOT DETECTED Final   Candida tropicalis NOT DETECTED NOT DETECTED Final   Cryptococcus neoformans/gattii NOT DETECTED NOT DETECTED Final   CTX-M ESBL NOT DETECTED NOT DETECTED Final   Carbapenem resistance IMP NOT DETECTED NOT DETECTED Final   Carbapenem resistance KPC NOT DETECTED NOT DETECTED Final   Carbapenem resistance NDM NOT DETECTED NOT DETECTED Final   Carbapenem resist OXA 48 LIKE NOT DETECTED NOT DETECTED Final   Carbapenem resistance VIM NOT DETECTED NOT DETECTED Final    Comment: Performed at [facility], [address] [place], [place] [address]  Blood culture (routine x 2)     Status: None (Preliminary result)   Collection Time: 12/13/20 11:20 AM   Specimen: Right Antecubital; Blood   Result Value Ref Range Status   Specimen Description   Final    RIGHT ANTECUBITAL Performed at [facility], [address], [place], [place] [address]    Special Requests   Final    Blood Culture results may not be optimal due to an inadequate volume of blood received in culture bottles Performed at [facility], [address], [place], [place] [address]    Culture   Final    NO GROWTH 3 DAYS Performed at [facility], [place] [address], [place], [place] [address]    Report Status PENDING  Incomplete  Blood culture (routine x 2)     Status: None (Preliminary result)   Collection Time: 12/14/20  5:26 PM   Specimen: BLOOD  Result Value Ref Range Status   Specimen Description   Final    BLOOD BLOOD RIGHT FOREARM Performed at [facility], [address] [place], [place] [address]    Special Requests   Final    BOTTLES DRAWN AEROBIC AND ANAEROBIC Blood Culture adequate volume Performed at [facility], [address], [place], [place] [address]    Culture   Final    NO GROWTH 2 DAYS Performed at [facility], [place] [address] [place], [place] [address]    Report Status PENDING  Incomplete  Urine Culture     Status: None   Collection Time: 12/14/20  7:24 PM   Specimen: Urine, Clean Catch  Result Value Ref Range Status   Specimen Description   Final    URINE, CLEAN CATCH Performed at [facility], [address], [place], [place] [address]    Special Requests   Final    NONE Performed at [facility], [address], [place], [place] [address]    Culture   Final    NO  GROWTH Performed at [facility], [place] [address], [place], [place] [address]    Report Status 12/15/2020 FINAL  Final     Labs: Basic Metabolic Panel: Recent Labs  Lab 12/13/20 0947 12/14/20 1725 12/16/20 0512  NA 133* 134* 138  K 3.8 3.9 3.5  CL 100 103 109  CO2  23 21* 22  GLUCOSE 100* 108* 100*  BUN _0 CREATININE 0.72 0.64 0.52  CALCIUM 8.7* 8.7* 8.4*   Liver Function Tests: Recent Labs  Lab 12/13/20 0947 12/14/20 1725 12/16/20 0512  AST 47* 133* 55*  ALT 63* 165* 95*  ALKPHOS 84 101 73  BILITOT 0.7 0.4 0.2*  PROT 7.8 8.5* 6.7  ALBUMIN 4.4 4.6 3.4*   No results for input(s): LIPASE, AMYLASE in the last 168 hours. No results for input(s): AMMONIA in the last 168 hours. CBC: Recent Labs  Lab 12/13/20 0947 12/14/20 1725 12/15/20 0534 12/16/20 0512  WBC 1.7* 2.8* 4.2 4.9  NEUTROABS 0.7* 0.7* 0.9* 0.8*  HGB 12.6 13.7 12.1 10.6*  HCT 39.1 42.5 37.6 32.7*  MCV 83.9 83.3 85.5 83.8  PLT 125* 151 140* 137*   Cardiac Enzymes: No results for input(s): CKTOTAL, CKMB, CKMBINDEX, TROPONINI in the last 168 hours. BNP: BNP (last 3 results) No results for input(s): BNP in the last 8760 hours.  ProBNP (last 3 results) No results for input(s): PROBNP in the last 8760 hours.  CBG: No results for input(s): GLUCAP in the last 168 hours.     Signed:  Florencia Reasons MD, PhD, FACP  [facility] 12/16/2020, 11:08 AM

## 2020-12-16 NOTE — Progress Notes (Signed)
Pharmacy Antibiotic Note  Sharon Bennett is a 40 y.o. female admitted on 12/14/2020 with E.coli bacteremia.  Pharmacy has been consulted for cefepime dosing.  Today, 12/16/20 WBC WNL Renal function stable, WNL. CrCl ~90 mL/min Afebrile  Today is day #3 of IV antibiotics  Plan: Continue cefepime 2 g IV q8h Final sensitivities pending  Renal function stable. Pharmacy to sign off. Please re-consult if needed.   Height: 5\' 6"  (167.6 cm) Weight: 71.9 kg (158 lb 8.2 oz) IBW/kg (Calculated) : 59.3  Temp (24hrs), Avg:98.2 F (36.8 C), Min:98 F (36.7 C), Max:98.3 F (36.8 C)  Recent Labs  Lab 12/13/20 0947 12/13/20 1120 12/14/20 1725 12/14/20 1924 12/15/20 0534 12/16/20 0512  WBC 1.7*  --  2.8*  --  4.2 4.9  CREATININE 0.72  --  0.64  --   --  0.52  LATICACIDVEN 1.2 1.5 1.1 1.2  --   --     Estimated Creatinine Clearance: 93.9 mL/min (by C-G formula based on SCr of 0.52 mg/dL).    Allergies  Allergen Reactions   Codeine Nausea And Vomiting   Tegaderm Ag Mesh [Silver] Rash    Allergic reaction to Tegaderm dressing    Antimicrobials this admission: cefepime 11/9 >>   Dose adjustments this admission: n/a  Microbiology results: 11/9 BCx: ngtd 11/9 UCx: ngF   Prior to admission: 11/8 BCx: E.coli  Thank you for allowing pharmacy to be a part of this patient's care.  Lenis Noon, PharmD 12/16/2020 7:56 AM

## 2020-12-16 NOTE — Discharge Planning (Signed)
Oncology Discharge Planning Note  [facility] Address: [address] [place], [place], [place] [address] Hours of Operation:  Nena Polio, Monday - Friday  Clinic Contact Information:  [phone]  Oncology Care Team: Medical Oncologist:  Dr. [name]   Patient Details: Name:  Sharon Bennett, Sharon Bennett MRN:   [id number] DOB:   [birth year] Reason for Current Admission: Bacteremia  Discharge Planning Narrative: Notification of admission received by [name] for Sharon Bennett.  Discharge follow-up appointments for oncology are current and available on the AVS and MyChart.   Upon discharge from the hospital, hematology/oncology's post discharge plan of care for the outpatient setting is: Continue chemotherapy on 12/22/20 pending counts and addition of ziextenzo with next treatment. This will be sent to her [name].   [name] will be called within two business days after discharge to review hematology/oncology's plan of care for full understanding.    Outpatient Oncology Specific Care Only: Oncology appointment transportation needs addressed?:  not applicable Oncology medication management for symptom management addressed?:  She is aware how and when to call office Chemo Alert Card reviewed?:  no Immunotherapy Alert Card reviewed?:  not applicable.Left VM for patient w/discharge plans and listed also on AVS

## 2020-12-16 NOTE — Discharge Instructions (Signed)
Per [name]: Follow up on 12/22/20 as scheduled at 10:00 am to potentially have your last cycle of neoadjuvant chemotherapy. Will be adding Ziextenzo injection on day 3 (day of pump d/c) to prevent neutropenia again. Call office with any new fever or inability to tolerate the antibiotic.

## 2020-12-16 NOTE — Progress Notes (Addendum)
HEMATOLOGY-ONCOLOGY PROGRESS NOTE  SUBJECTIVE: Sharon Bennett feels better.  She is no longer having any fevers.  She has no nausea or vomiting.  Oncology History  Rectal cancer ([place])  09/05/2020 Initial Diagnosis   Rectal cancer ([place])   09/05/2020 Cancer Staging   Staging form: Colon and Rectum, AJCC 8th Edition - Clinical: Stage IIIC (cT3, cN2b, cM0) - Signed by Ladell Pier, MD on 09/05/2020    09/15/2020 -  Chemotherapy   Patient is on Treatment Plan : COLORECTAL FOLFOX q14d x 4 months      Genetic Testing   Negative genetic testing. No pathogenic variants identified on the [facility] Multi-Cancer Panel. VUS in MUTYH called c.1532C>T (p.Ser511Phe) identified. The report date is 09/24/2020.   The Multi-Cancer Panel offered by [facility] includes sequencing and/or deletion duplication testing of the following 84 genes: AIP, ALK, APC, ATM, AXIN2,BAP1,  BARD1, BLM, BMPR1A, BRCA1, BRCA2, BRIP1, CASR, CDC73, CDH1, CDK4, CDKN1B, CDKN1C, CDKN2A (p14ARF), CDKN2A (p16INK4a), CEBPA, CHEK2, CTNNA1, DICER1, DIS3L2, EGFR (c.2369C>T, p.Thr790Met variant only), EPCAM (Deletion/duplication testing only), FH, FLCN, GATA2, GPC3, GREM1 (Promoter region deletion/duplication testing only), HOXB13 (c.251G>A, p.Gly84Glu), HRAS, KIT, MAX, MEN1, MET, MITF (c.952G>A, p.Glu318Lys variant only), MLH1, MSH2, MSH3, MSH6, MUTYH, NBN, NF1, NF2, NTHL1, PALB2, PDGFRA, PHOX2B, PMS2, POLD1, POLE, POT1, PRKAR1A, PTCH1, PTEN, RAD50, RAD51C, RAD51D, RB1, RECQL4, RET, RUNX1, SDHAF2, SDHA (sequence changes only), SDHB, SDHC, SDHD, SMAD4, SMARCA4, SMARCB1, SMARCE1, STK11, SUFU, TERC, TERT, TMEM127, TP53, TSC1, TSC2, VHL, WRN and WT1.     PHYSICAL EXAMINATION:  Vitals:   12/15/20 2148 12/16/20 0622  BP: 112/75 121/75  Pulse: 95 87  Resp: 17 17  Temp: 98.1 F (36.7 C) 98.3 F (36.8 C)  SpO2: 99% 99%   Filed Weights   12/15/20 0031  Weight: 71.9 kg    Intake/Output from previous day: 11/10 0701 - 11/11 0700 In: 240  [P.O.:240] Out: -   GENERAL:alert, no distress and comfortable SKIN: skin color, texture, turgor are normal, no rashes or significant lesions EYES: normal, Conjunctiva are pink and non-injected, sclera clear OROPHARYNX:no exudate, no erythema and lips, buccal mucosa, and tongue normal  LUNGS: clear to auscultation and percussion with normal breathing effort HEART: regular rate & rhythm and no murmurs and no lower extremity edema ABDOMEN:abdomen soft, non-tender and normal bowel sounds NEURO: alert & oriented x 3 with fluent speech, no focal motor/sensory deficits  LABORATORY DATA:  I have reviewed the data as listed CMP Latest Ref Rng & Units 12/16/2020 12/14/2020 12/13/2020  Glucose 70 - 99 mg/dL 100(H) 108(H) 100(H)  BUN 6 - 20 mg/dL _0 Creatinine 0.44 - 1.00 mg/dL 0.52 0.64 0.72  Sodium 135 - 145 mmol/L 138 134(L) 133(L)  Potassium 3.5 - 5.1 mmol/L 3.5 3.9 3.8  Chloride 98 - 111 mmol/L 109 103 100  CO2 22 - 32 mmol/L 22 21(L) 23  Calcium 8.9 - 10.3 mg/dL 8.4(L) 8.7(L) 8.7(L)  Total Protein 6.5 - 8.1 g/dL 6.7 8.5(H) 7.8  Total Bilirubin 0.3 - 1.2 mg/dL 0.2(L) 0.4 0.7  Alkaline Phos 38 - 126 U/L 73 101 84  AST 15 - 41 U/L 55(H) 133(H) 47(H)  ALT 0 - 44 U/L 95(H) 165(H) 63(H)    Lab Results  Component Value Date   WBC 4.9 12/16/2020   HGB 10.6 (L) 12/16/2020   HCT 32.7 (L) 12/16/2020   MCV 83.8 12/16/2020   PLT 137 (L) 12/16/2020   NEUTROABS 0.8 (L) 12/16/2020    DG Chest Portable 1 View  Result Date:  12/13/2020 CLINICAL DATA:  Fever, on chemotherapy EXAM: PORTABLE CHEST 1 VIEW COMPARISON:  None. FINDINGS: The heart size and mediastinal contours are within normal limits. Both lungs are clear. No pleural effusion. Right chest wall port catheter tip overlies SVC the visualized skeletal structures are unremarkable. IMPRESSION: No acute process in the chest. Electronically Signed   By: Macy Mis M.D.   On: 12/13/2020 09:06   US Abdomen Limited RUQ (LIVER/GB)  Result  Date: 12/15/2020 CLINICAL DATA:  Elevated liver function studies. EXAM: ULTRASOUND ABDOMEN LIMITED RIGHT UPPER QUADRANT COMPARISON:  CT scan 08/21/2020 FINDINGS: Gallbladder: No gallstones or wall thickening visualized. No sonographic Murphy sign noted by sonographer. Common bile duct: Diameter: 4.0 mm Liver: Normal echogenicity without focal lesion or biliary dilatation. Portal vein is patent on color Doppler imaging with normal direction of blood flow towards the liver. Other: None. IMPRESSION: Unremarkable right upper quadrant ultrasound examination. Electronically Signed   By: Marijo Sanes M.D.   On: 12/15/2020 08:10    ASSESSMENT AND PLAN: Rectal cancer  CT abdomen/pelvis 08/21/2020-large rectal mass measuring 4.4 x 2.6 x 5.5 cm, haziness in the mesorectal fat, obscuration of the fat plane between the mass and the adjacent posterior aspect of the uterus, numerous prominent mesorectal lymph nodes measuring up to 5 mm Colonoscopy 08/23/2020-fungating infiltrative nonobstructing large mass found in the rectum.  Mass was noncircumferential, measured 5 cm in length, 3 cm in diameter and was 7 cm from the anal verge.  No bleeding was present.   Pathology showed invasive colonic adenocarcinoma, moderately differentiated.  No evidence of mismatch repair deficiency. Chest CT 09/01/2020-no evidence of metastatic disease MRI pelvis 09/02/2020-tumor at 10.4 cm from anal verge, 5.9 cm from the internal anal sphincter, T3, approximately 15 greater than 5 mm lymph nodes, N2b, multiple uterine fibroids Cycle 1 FOLFOX 09/15/2020 Cycle 2 FOLFOX 09/29/2020 Cycle 3 FOLFOX 10/13/2020 Cycle 4 FOLFOX 10/27/2020 Cycle 5 FOLFOX 11/10/2020 Cycle 6 FOLFOX 11/24/2020 Cycle 7 FOLFOX 12/08/2020 Rectal bleeding, change in bowel habits secondary to #1 Uterine fibroids noted on MRI pelvis 09/02/2020 Port-A-Cath placement 09/09/2020, [facility] admission 12/14/2020-E. coli bacteremia, febrile neutropenia  [name] appears improved.  Sensitivities from the initial blood culture have returned and she is pansensitive.  Discussed with hospitalist who reviewed with ID pharmacist and they recommend Augmentin.  Recommend a total of 10 days of treatment.  A repeat blood culture from 12/14/2020 is negative to date.  Her CBC from this morning was reviewed and her white blood cell count remains normal but ANC is still low at 0.8.  We will continue to follow this as an outpatient.  Plan to add G-CSF support with next cycle of chemotherapy.  We will plan to continue to monitor her CBC closely.  She has had transaminitis this admission which is improving.  This was likely due to prior chemotherapy and bacteremia.  We will plan to continue to monitor this as an outpatient.  Recommendations:  1.  Okay to transition to oral antibiotics based on sensitivities.  ID pharmacist recommends Augmentin.  We recommend a total of 10 days of treatment. 2.  Okay to discharge from our standpoint.  She already has outpatient follow-up scheduled.  We will continue to follow her CBC and liver enzymes as an outpatient.  We will plan to add G-CSF support with her next cycle of chemotherapy.  Future Appointments  Date Time Provider [place]  12/22/2020 10:00 AM [facility] PHLEBOTOMIST [facility] None  12/22/2020 10:15 AM [facility] FLUSH ROOM  [facility] None  12/22/2020 10:40 AM Ladell Pier, MD [facility] None  12/22/2020 11:00 AM [facility] CHAIR 2 [facility] None  01/05/2021  8:15 AM [facility] PHLEBOTOMIST [facility] None  01/05/2021  8:30 AM [facility] FLUSH ROOM [facility] None  01/05/2021  9:00 AM Ladell Pier, MD [facility] None  01/23/2021  2:30 PM [place], Raymond J, MD [facility]      LOS: 1 day   Mikey Bussing, DNP, AGPCNP-BC, AOCNP 12/16/20  Sharon Bennett was interviewed and examined.  She remains afebrile.  The neutrophil count is stable.  The liver enzymes are improved.  She feels well.  The E. coli is pansensitive.   She will follow-up as scheduled on 12/22/2020.  We will add G-CSF with the next cycle of chemotherapy.  I reviewed potential side effects associated with G-CSF.  She agrees to proceed.  I was present for greater than 50% of today's visit.  I performed medical decision making.

## 2020-12-17 ENCOUNTER — Telehealth: Payer: Self-pay | Admitting: Emergency Medicine

## 2020-12-18 ENCOUNTER — Other Ambulatory Visit: Payer: Self-pay | Admitting: Oncology

## 2020-12-18 LAB — CULTURE, BLOOD (ROUTINE X 2): Culture: NO GROWTH

## 2020-12-19 ENCOUNTER — Other Ambulatory Visit: Payer: Self-pay | Admitting: *Deleted

## 2020-12-19 ENCOUNTER — Encounter: Payer: Self-pay | Admitting: *Deleted

## 2020-12-19 ENCOUNTER — Telehealth: Payer: Self-pay | Admitting: *Deleted

## 2020-12-19 LAB — CULTURE, BLOOD (ROUTINE X 2)
Culture: NO GROWTH
Special Requests: ADEQUATE

## 2020-12-19 NOTE — Patient Outreach (Signed)
[facility]) Care Management  12/19/2020  Sharon Bennett Sep 22, 1980 [id number]   Transition of care call/case closure   Referral received:12/16/20 Initial outreach:12/19/20 Insurance: [employer]    Subjective: Initial successful telephone call to patient's preferred number in order to complete transition of care assessment; 2 HIPAA identifiers verified. Explained purpose of call and completed transition of care assessment.  Sharon Bennett states that she is doing better, she denies fever, shortness of breath, cough .  She reports tolerating diet better discussed occasional nausea related to chemotherapy. She denies bowel or bladder problems. Patient states having support at home her son and church family .   Reviewed accessing the following [place] Benefits :  She does have FMLA in place, she declined need for additional educational or resource information.      Objective:  [facility] for Sepsis, Ecoli Bacteremia  PMHx include: Rectal Cancer undergoing chemotherapy.  She  was discharged to home on 12/16/20 without the need for home health services or DME.   Assessment:  Patient voices good understanding of all discharge instructions.  See transition of care flowsheet for assessment details.   Plan:  Reviewed hospital discharge diagnosis of Sepsis, Ecoli bacteremia   and discharge treatment plan using hospital discharge instructions, assessing medication adherence, reviewing problems requiring provider notification, and discussing the importance of follow up with surgeon, primary care provider and/or specialists as directed.   No ongoing care management needs identified so will close case to [place] Management services and route successful outreach letter with [place] Management pamphlet and 24 Hour Nurse Line Magnet to [place] Management  clinical pool to be mailed to patient's home address.  Thanked patient for their services to [facility].    Joylene Draft, RN, BSN  [place] Management Coordinator  [phone]- Mobile [phone]- Toll Free Main Office

## 2020-12-19 NOTE — Telephone Encounter (Signed)
Sharon Bennett was contacted by telephone to verify understanding of discharge instructions status post their most recent discharge from the hospital on the date: 12/16/20.  Inpatient discharge AVS was re-reviewed with patient, along with cancer center appointments.  Verification of understanding for oncology specific follow-up was validated using the Teach Back method.    Transportation to appointments were confirmed for the patient as being self/caregiver.  Sharon Bennett's questions were addressed to their satisfaction upon completion of this post discharge follow-up call for outpatient oncology.she will f/u on 11/17 as scheduled.

## 2020-12-20 ENCOUNTER — Other Ambulatory Visit (HOSPITAL_COMMUNITY): Payer: Self-pay

## 2020-12-20 LAB — CULTURE, BLOOD (ROUTINE X 2)
Culture: NO GROWTH
Special Requests: ADEQUATE

## 2020-12-22 ENCOUNTER — Other Ambulatory Visit: Payer: Self-pay | Admitting: *Deleted

## 2020-12-22 ENCOUNTER — Other Ambulatory Visit: Payer: Self-pay

## 2020-12-22 ENCOUNTER — Inpatient Hospital Stay: Payer: 59

## 2020-12-22 ENCOUNTER — Other Ambulatory Visit (HOSPITAL_COMMUNITY): Payer: Self-pay

## 2020-12-22 ENCOUNTER — Encounter: Payer: Self-pay | Admitting: Oncology

## 2020-12-22 ENCOUNTER — Telehealth: Payer: Self-pay | Admitting: Pharmacy Technician

## 2020-12-22 ENCOUNTER — Inpatient Hospital Stay: Payer: 59 | Admitting: Oncology

## 2020-12-22 VITALS — BP 114/81 | HR 93 | Temp 97.7°F | Resp 18 | Ht 66.0 in | Wt 161.4 lb

## 2020-12-22 DIAGNOSIS — C20 Malignant neoplasm of rectum: Secondary | ICD-10-CM

## 2020-12-22 DIAGNOSIS — Z5111 Encounter for antineoplastic chemotherapy: Secondary | ICD-10-CM | POA: Diagnosis not present

## 2020-12-22 LAB — CMP (CANCER CENTER ONLY)
ALT: 210 U/L — ABNORMAL HIGH (ref 0–44)
AST: 119 U/L — ABNORMAL HIGH (ref 15–41)
Albumin: 4.2 g/dL (ref 3.5–5.0)
Alkaline Phosphatase: 78 U/L (ref 38–126)
Anion gap: 7 (ref 5–15)
BUN: 15 mg/dL (ref 6–20)
CO2: 25 mmol/L (ref 22–32)
Calcium: 9 mg/dL (ref 8.9–10.3)
Chloride: 103 mmol/L (ref 98–111)
Creatinine: 0.88 mg/dL (ref 0.44–1.00)
GFR, Estimated: >60 mL/min (ref >60)
Glucose, Bld: 92 mg/dL (ref 70–99)
Potassium: 3.7 mmol/L (ref 3.5–5.1)
Sodium: 135 mmol/L (ref 135–145)
Total Bilirubin: 0.4 mg/dL (ref 0.3–1.2)
Total Protein: 7.4 g/dL (ref 6.5–8.1)

## 2020-12-22 LAB — CBC WITH DIFFERENTIAL (CANCER CENTER ONLY)
Abs Immature Granulocytes: 0.01 10*3/uL (ref 0.00–0.07)
Basophils Absolute: 0 10*3/uL (ref 0.0–0.1)
Basophils Relative: 1 %
Eosinophils Absolute: 0 10*3/uL (ref 0.0–0.5)
Eosinophils Relative: 1 %
HCT: 35.1 % — ABNORMAL LOW (ref 36.0–46.0)
Hemoglobin: 11.2 g/dL — ABNORMAL LOW (ref 12.0–15.0)
Immature Granulocytes: 0 %
Lymphocytes Relative: 58 %
Lymphs Abs: 2.8 10*3/uL (ref 0.7–4.0)
MCH: 27.5 pg (ref 26.0–34.0)
MCHC: 31.9 g/dL (ref 30.0–36.0)
MCV: 86.2 fL (ref 80.0–100.0)
Monocytes Absolute: 0.8 10*3/uL (ref 0.1–1.0)
Monocytes Relative: 17 %
Neutro Abs: 1.1 10*3/uL — ABNORMAL LOW (ref 1.7–7.7)
Neutrophils Relative %: 23 %
Platelet Count: 179 10*3/uL (ref 150–400)
RBC: 4.07 MIL/uL (ref 3.87–5.11)
RDW: 18 % — ABNORMAL HIGH (ref 11.5–15.5)
WBC Count: 4.7 10*3/uL (ref 4.0–10.5)
nRBC: 0 % (ref 0.0–0.2)

## 2020-12-22 MED ORDER — CAPECITABINE 500 MG PO TABS
ORAL_TABLET | ORAL | 0 refills | Status: DC
  Filled 2020-12-22: qty 168, fill #0

## 2020-12-22 NOTE — Progress Notes (Signed)
Patient to begin concurrent Capecitabine/Radiation on 12/12. Referral to Rad/Onc entered.

## 2020-12-22 NOTE — Progress Notes (Signed)
[place] OFFICE PROGRESS NOTE   Diagnosis: Rectal cancer  INTERVAL HISTORY:   Ms. Emerick returns as scheduled.  She completed another cycle of FOLFOX on 12/08/2020.  She was admitted 12/14/2020 with febrile neutropenia.  A blood culture from the Port-A-Cath returned positive for E. coli.  She is completing an outpatient course of Augmentin.  No recurrent fever. She reports "tingling "in the extremities in the cold weather.  No numbness.  No discomfort at the Port-A-Cath site.  Objective:  Vital signs in last 24 hours:  Blood pressure 114/81, pulse 93, temperature 97.7 F (36.5 C), temperature source Oral, resp. rate 18, height 5' 6" (1.676 m), weight 161 lb 6.4 oz (73.2 kg), SpO2 100 %.    HEENT: No thrush or ulcers Resp: Lungs clear bilaterally Cardio: Regular rate and rhythm GI: Nontender, no hepatomegaly Vascular: No leg edema Neuro: The vibratory sense is intact at the fingertips bilaterally Skin: Mild hyperpigmentation of the hands  Portacath/PICC-without erythema  Lab Results:  Lab Results  Component Value Date   WBC 4.7 12/22/2020   HGB 11.2 (L) 12/22/2020   HCT 35.1 (L) 12/22/2020   MCV 86.2 12/22/2020   PLT 179 12/22/2020   NEUTROABS 1.1 (L) 12/22/2020    CMP  Lab Results  Component Value Date   NA 138 12/16/2020   K 3.5 12/16/2020   CL 109 12/16/2020   CO2 22 12/16/2020   GLUCOSE 100 (H) 12/16/2020   BUN 8 12/16/2020   CREATININE 0.52 12/16/2020   CALCIUM 8.4 (L) 12/16/2020   PROT 6.7 12/16/2020   ALBUMIN 3.4 (L) 12/16/2020   AST 55 (H) 12/16/2020   ALT 95 (H) 12/16/2020   ALKPHOS 73 12/16/2020   BILITOT 0.2 (L) 12/16/2020   GFRNONAA >60 12/16/2020    Lab Results  Component Value Date   CEA 1.48 09/15/2020   Medications: I have reviewed the patient's current medications.   Assessment/Plan: Rectal cancer  CT abdomen/pelvis 08/21/2020-large rectal mass measuring 4.4 x 2.6 x 5.5 cm, haziness in the mesorectal fat, obscuration  of the fat plane between the mass and the adjacent posterior aspect of the uterus, numerous prominent mesorectal lymph nodes measuring up to 5 mm Colonoscopy 08/23/2020-fungating infiltrative nonobstructing large mass found in the rectum.  Mass was noncircumferential, measured 5 cm in length, 3 cm in diameter and was 7 cm from the anal verge.  No bleeding was present.   Pathology showed invasive colonic adenocarcinoma, moderately differentiated.  No evidence of mismatch repair deficiency. Chest CT 09/01/2020-no evidence of metastatic disease MRI pelvis 09/02/2020-tumor at 10.4 cm from anal verge, 5.9 cm from the internal anal sphincter, T3, approximately 15 greater than 5 mm lymph nodes, N2b, multiple uterine fibroids Cycle 1 FOLFOX 09/15/2020 Cycle 2 FOLFOX 09/29/2020 Cycle 3 FOLFOX 10/13/2020 Cycle 4 FOLFOX 10/27/2020 Cycle 5 FOLFOX 11/10/2020 Cycle 6 FOLFOX 11/24/2020 Cycle 7 FOLFOX 12/08/2020 Rectal bleeding, change in bowel habits secondary to #1 Uterine fibroids noted on MRI pelvis 09/02/2020 Port-A-Cath placement 09/09/2020, Interventional Radiology Admission 12/14/2020 with febrile neutropenia, E. coli bacteremia-completed an outpatient course of Augmentin      Disposition: Ms. Spiller has completed 7 cycles of neoadjuvant FOLFOX.  She developed febrile neutropenia following cycle 7 and was admitted to the hospital.  A blood culture from the Port-A-Cath grew E. coli.  She is completing a course of Augmentin over the next few days.  No source for infection was identified.  The Port-A-Cath does not appear infected, but we will plan for Port-A-Cath  removal if she develops recurrent bacteremia.  The neutrophil count is low again today.  We discussed the risk of proceeding with chemotherapy.  We decided to delay chemotherapy until 01/02/2021.  She will receive G-CSF support with the next cycle of chemotherapy.  We reviewed potential toxicities associated with G-CSF including the chance of a rash, splenic  rupture, and bone pain.  She agrees to proceed.  She will begin capecitabine and concurrent radiation 2 weeks after the completion of FOLFOX.  We reviewed the potential toxicities associated with capecitabine including the chance of mucositis, diarrhea, and hand/foot syndrome.  She agrees to proceed.  A prescription for capecitabine was entered today.  The liver enzymes remain elevated, likely secondary to toxicity from oxaliplatin.  We will check a chemistry panel when she is here on 12/25/2020.  [name], MD  12/22/2020  11:18 AM

## 2020-12-22 NOTE — Telephone Encounter (Signed)
Oral Oncology Patient Advocate Encounter   Received notification from [employer] that prior authorization for Xeloda is required.   PA submitted on CoverMyMeds Key [id number]  Status is pending   Oral Oncology Clinic will continue to follow.  [place] Patient [place] Phone [phone] Fax [phone] 12/22/2020 3:33 PM

## 2020-12-22 NOTE — Patient Instructions (Signed)

## 2020-12-23 ENCOUNTER — Encounter: Payer: Self-pay | Admitting: *Deleted

## 2020-12-23 ENCOUNTER — Other Ambulatory Visit (HOSPITAL_COMMUNITY): Payer: Self-pay

## 2020-12-23 ENCOUNTER — Telehealth: Payer: Self-pay | Admitting: Pharmacist

## 2020-12-23 DIAGNOSIS — C20 Malignant neoplasm of rectum: Secondary | ICD-10-CM

## 2020-12-23 MED ORDER — CAPECITABINE 500 MG PO TABS
1500.0000 mg | ORAL_TABLET | Freq: Two times a day (BID) | ORAL | 0 refills | Status: DC
  Filled 2020-12-23: qty 168, 28d supply, fill #0
  Filled 2021-01-16: qty 120, 28d supply, fill #0
  Filled 2021-02-10: qty 48, 8d supply, fill #1

## 2020-12-23 NOTE — Telephone Encounter (Signed)
Oral Oncology Patient Advocate Encounter  Prior Authorization for Capecitabine (Xeloda) has been approved.    PA# [id number] Effective dates: 12/22/20 through 12/21/21  Patients co-pay is $0.00.  Oral Oncology Clinic will continue to follow.   [place] Patient [place] Phone [phone] Fax [phone] 12/23/2020 11:51 AM

## 2020-12-23 NOTE — Telephone Encounter (Signed)
Oral Oncology Pharmacist Encounter  Received new prescription for Xeloda (capecitabine) for the treatment of rectal cancer in conjunction with XRT, planned duration until disease progression or unacceptable drug toxicity the end of XRT.  CMP from 12/22/20 assessed, until disease progression or unacceptable drug toxicity. Prescription dose and frequency assessed.   Current medication list in Epic reviewed, one relevant DDIs with capecitabine identified: Pantoprazole: Proton Pump Inhibitors (PPI) may diminish the therapeutic effect of capecitabine, varying information on the clinical impact. Recommend evaluating the need for a PPI/acid suppression. If acid suppression is needed, attempt switching to a H2 antagonist (eg, famotidine) if possible.  Evaluated chart and no patient barriers to medication adherence identified.   Prescription has been e-scribed to the [facility] for benefits analysis and approval.  Oral Oncology Clinic will continue to follow for insurance authorization, copayment issues, initial counseling and start date.  Patient agreed to treatment on 12/22/20 per MD documentation.  Darl Pikes, PharmD, BCPS, BCOP, CPP Hematology/Oncology Clinical Pharmacist Practitioner [facility] [phone]  12/23/2020 10:31 AM

## 2020-12-23 NOTE — Progress Notes (Signed)
[employer] has denied PA for Ziextenzo for outpatient pharmacy and self-administration for patient. Require patient to take Nyvepria unless it is contraindicated. Notified MD. Patient has been doing her own pump d/c in home and were trying to get her the medication so she will not need to come in on Saturday for administration (has been approved for clinic administration). Will await MD input.

## 2020-12-27 ENCOUNTER — Other Ambulatory Visit (HOSPITAL_COMMUNITY): Payer: Self-pay

## 2020-12-28 ENCOUNTER — Telehealth: Payer: Self-pay | Admitting: Pharmacy Technician

## 2020-12-28 ENCOUNTER — Other Ambulatory Visit: Payer: Self-pay | Admitting: *Deleted

## 2020-12-28 ENCOUNTER — Other Ambulatory Visit (HOSPITAL_COMMUNITY): Payer: Self-pay

## 2020-12-28 MED ORDER — PEGFILGRASTIM-APGF 6 MG/0.6ML ~~LOC~~ SOSY
6.0000 mg | PREFILLED_SYRINGE | Freq: Once | SUBCUTANEOUS | 0 refills | Status: AC
Start: 2020-12-28 — End: 2021-01-04
  Filled 2020-12-28 – 2021-01-02 (×2): qty 0.6, 1d supply, fill #0

## 2020-12-28 NOTE — Telephone Encounter (Signed)
Oral Oncology Patient Advocate Encounter  Prior Authorization for Sharon Bennett has been approved.    PA# [id number] Effective dates: 12/28/20 through 12/27/21 - max of 12 fills.  Patients co-pay is $0.00.  Copay may be higher in January when insurance renews.  Oral Oncology Clinic will continue to follow.   [place] Patient [name] Phone [phone] Fax [phone] 12/28/2020 12:15 PM

## 2020-12-28 NOTE — Telephone Encounter (Signed)
Oral Oncology Patient Advocate Encounter   Received notification from [employer] that prior authorization for Nyvepria is required.   PA submitted on CoverMyMeds Key [id number] Status is pending   Oral Oncology Clinic will continue to follow.  [place] Patient [place] Phone [phone] Fax [phone] 12/28/2020 10:26 AM

## 2020-12-28 NOTE — Progress Notes (Signed)
Per pharmacist, the [employer] has been approved with no co-pay. Script sent to [place].

## 2021-01-02 ENCOUNTER — Other Ambulatory Visit (HOSPITAL_COMMUNITY): Payer: Self-pay

## 2021-01-02 ENCOUNTER — Telehealth: Payer: Self-pay

## 2021-01-02 NOTE — Telephone Encounter (Signed)
Oral Oncology Patient Advocate Encounter  I spoke with Sharon Bennett this morning to set up delivery of Nyvepria.  Address verified for shipment.  Nyvepria will be filled through [facility] and mailed 11/29 for delivery 11/30.  Patient will need to administer on 01/05/21.     [place] Patient [place] Phone [phone] Fax [phone] 01/02/2021 10:33 AM

## 2021-01-02 NOTE — Progress Notes (Addendum)
GI Location of Tumor / Histology: Rectal Cancer  Sharon Bennett presented with rectal bleeding.  Diagnostic imaging was obtained and showed a 5.5 cm mass in the rectum obscuring fat planes and numerous mesorectal nodes.  MRI Pelvis 09/02/2020: tumor at 10.4 cm from anal verge, 5.9 cm from the internal anal sphincter, T3, approximately 15 greater than 5 mm lymph nodes, N2b, multiple uterine fibroids  Colonoscopy 08/23/2020-fungating infiltrative nonobstructing large mass found in the rectum.  Mass was noncircumferential, measured 5 cm in length, 3 cm in diameter and was 7 cm from the anal verge.  No bleeding was present.   CT abdomen/pelvis 08/21/2020-large rectal mass measuring 4.4 x 2.6 x 5.5 cm, haziness in the mesorectal fat, obscuration of the fat plane between the mass and the adjacent posterior aspect of the uterus, numerous prominent mesorectal lymph nodes measuring up to 5 mm  Biopsies of Rectal Mass 08/23/2020    Past/Anticipated interventions by surgeon, if any:  Dr. Dema Severin 09/05/2020 --TNT has been recommended with [name] given her locally advanced mid rectal cancer.  -We spent time going over the treatment overview with regards to upfront chemo and chemoradiation followed potentially with surgery.  -We discussed what surgery would involve. We discussed some involved segmental resection of the portion of sigmoid colon and rectum down to the level of the tumor. We discussed what a colorectal anastomosis at this location would consist of. We discussed that this procedure would involve a diverting loop ileostomy and rationale therein. -Return in about 4 months (around 01/05/2021) for at/just before starting radiation.   Past/Anticipated interventions by medical oncology, if any:  [name] 12/16/2020 - Chemotherapy 09/15/2020-12/08/2020,  planned cycle 8 01/05/2021   Weight changes, if any: Stable over the past month.  Bowel/Bladder complaints, if any: Taking stool softeners  daily.  Denies bladder changes.  Nausea / Vomiting, if any: No  Pain issues, if any: No   Any blood per rectum: Has occasional small amounts of blood in her stool.     SAFETY ISSUES: Prior radiation? No Pacemaker/ICD? No Possible current pregnancy? Oral Contraceptives Is the patient on methotrexate? No  Current Complaints/Details: [name] 09/09/2020

## 2021-01-02 NOTE — Telephone Encounter (Signed)
Pt called in today stated she had a fever of 102 on Friday while she was at work. Pt  stated she left work took some tylenol and hasn't had a temp since. Informed Pt if she should notice she has a fever again to give a return call to the office. Pt verbalized understanding.

## 2021-01-03 ENCOUNTER — Other Ambulatory Visit (HOSPITAL_COMMUNITY): Payer: Self-pay

## 2021-01-03 ENCOUNTER — Encounter: Payer: Self-pay | Admitting: Radiation Oncology

## 2021-01-03 ENCOUNTER — Ambulatory Visit
Admission: RE | Admit: 2021-01-03 | Discharge: 2021-01-03 | Disposition: A | Discharge status: Home / Self Care | Payer: 59 | Source: Ambulatory Visit | Attending: Radiation Oncology | Admitting: Radiation Oncology

## 2021-01-03 ENCOUNTER — Telehealth: Payer: Self-pay | Admitting: Pharmacist

## 2021-01-03 ENCOUNTER — Other Ambulatory Visit: Payer: Self-pay

## 2021-01-03 VITALS — BP 115/88 | HR 102 | Temp 97.8°F | Resp 20 | Ht 65.75 in | Wt 162.8 lb

## 2021-01-03 DIAGNOSIS — Z803 Family history of malignant neoplasm of breast: Secondary | ICD-10-CM | POA: Insufficient documentation

## 2021-01-03 DIAGNOSIS — R945 Abnormal results of liver function studies: Secondary | ICD-10-CM | POA: Insufficient documentation

## 2021-01-03 DIAGNOSIS — C20 Malignant neoplasm of rectum: Secondary | ICD-10-CM | POA: Insufficient documentation

## 2021-01-03 DIAGNOSIS — Z79899 Other long term (current) drug therapy: Secondary | ICD-10-CM | POA: Diagnosis not present

## 2021-01-03 DIAGNOSIS — Z808 Family history of malignant neoplasm of other organs or systems: Secondary | ICD-10-CM | POA: Diagnosis not present

## 2021-01-03 DIAGNOSIS — Z800 Family history of malignant neoplasm of digestive organs: Secondary | ICD-10-CM | POA: Diagnosis not present

## 2021-01-03 NOTE — Progress Notes (Signed)
Radiation Oncology         ([phone] ________________________________  Name: Sharon Bennett        MRN: [id number]  Date of Service: 01/03/2021 DOB: 01-Dec-1980  CC:de [place], Blondell Reveal, MD  Ladell Pier, MD     REFERRING PHYSICIAN: Ladell Pier, MD   DIAGNOSIS: The encounter diagnosis was Rectal cancer [facility]).   HISTORY OF PRESENT ILLNESS: Sharon Bennett is a 40 y.o. female seen at the request of [name] for a diagnosis of locally advanced rectal carcinoma. She presented with rectal bleeding and diagnostic work up with CT abdomen/pelvis on 08/21/20 showed a 5.5 cm mass in the rectum obscuring fat planes and numerous mesorectal nodes were noted. A colonoscopy on 08/23/20 showed a fungating mass 7 cm from the anal verge that was about 5 cm and nonobstructing. Pathology showed moderately differentiated adenocarcinoma and CT Chest did not show evidence of metastatic disease on 09/01/20. An MRI pelvis on 09/02/20 showed a 10.4 cm mass in the rectum with 15 nodes over 5 mm, so her stage by MRI was [id number]. She started FOLFOX total neoadjuvant chemotherapy on 09/15/20. She started cycle 8 yesterday, with delays due to neutropenia. She's seen today to discuss options of chemoRT.      PREVIOUS RADIATION THERAPY: No   PAST MEDICAL HISTORY:  Past Medical History:  Diagnosis Date   Colorectal cancer ([place])    Family history of brain cancer    Family history of breast cancer    Family history of pancreatic cancer        PAST SURGICAL HISTORY: Past Surgical History:  Procedure Laterality Date   IR IMAGING GUIDED PORT INSERTION  09/09/2020     FAMILY HISTORY:  Family History  Problem Relation Age of Onset   Hypertension Mother    Heart failure Father    Hypertension Father    Pancreatic cancer Maternal Grandfather        dx 65s   Brain cancer Paternal Grandmother        dx >50   Breast cancer Other 89       MGF's sister     SOCIAL HISTORY:  reports that she has never  smoked. She has never used smokeless tobacco. She reports current alcohol use. The patient is single and lives in [place]. 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   ALLERGIES: Codeine and Tegaderm ag mesh [silver]   MEDICATIONS:  Current Outpatient Medications  Medication Sig Dispense Refill   acetaminophen (TYLENOL) 500 MG tablet Take 1,000 mg by mouth every 6 (six) hours as needed for mild pain or headache.     aspirin-acetaminophen-caffeine (EXCEDRIN MIGRAINE) 250-250-65 MG tablet Take 1 tablet by mouth every 6 (six) hours as needed for headache.     capecitabine (XELODA) 500 MG tablet Take 3 tablets (1,500 mg total) by mouth 2 (two) times daily after a meal. Take Monday-Friday. Take only on days of radiation. 168 tablet 0   lidocaine-prilocaine (EMLA) cream Apply 1 application topically as needed. Apply 1/2 tablespoon to port site and cover with plastic wrap 2 hours prior to stick to numb site 30 g 2   melatonin 5 MG TABS Take 5 mg by mouth at bedtime as needed (sleep).     ondansetron (ZOFRAN) 8 MG tablet Take 1 tablet (8 mg total) by mouth every 8 (eight) hours  as needed for nausea or vomiting. 30 tablet 1   pantoprazole (PROTONIX) 40 MG tablet TAKE 1 TABLET BY MOUTH EVERY DAY (Patient taking differently: Take 40 mg by mouth daily as needed (heartburn).) 30 tablet 0   pegfilgrastim-apgf (NYVEPRIA) 6 MG/0.6ML injection Inject 0.6 mLs (6 mg total) into the skin once for 1 dose. 0.6 mL 0   prochlorperazine (COMPAZINE) 10 MG tablet Take 1 tablet (10 mg total) by mouth every 6 (six) hours as needed for nausea or vomiting. 60 tablet 1   SLYND 4 MG TABS Take 1 tablet by mouth daily.     No current facility-administered medications for this encounter.     REVIEW OF SYSTEMS: On review of systems, the patient reports that she is doing pretty well overall. She has been  fatigued during chemotherapy and reports she is starting to feel a bit better since her counts were low prior to this upcoming cycle of chemo this Thursday. She reports occasional rectal bleeding and takes stool softeners daily. She denies any unintended weight changes, or difficulty with urinary habits. She is not sexually active but takes OCPs and stacks her pill packs to avoid menstrual periods. No other complaints are verbalized.     PHYSICAL EXAM:  Wt Readings from Last 3 Encounters:  01/03/21 162 lb 12.8 oz (73.8 kg)  12/22/20 161 lb 6.4 oz (73.2 kg)  12/15/20 158 lb 8.2 oz (71.9 kg)   Temp Readings from Last 3 Encounters:  01/03/21 97.8 F (36.6 C)  12/22/20 97.7 F (36.5 C) (Oral)  12/16/20 98.3 F (36.8 C) (Oral)   BP Readings from Last 3 Encounters:  01/03/21 115/88  12/22/20 114/81  12/16/20 121/75   Pulse Readings from Last 3 Encounters:  01/03/21 (!) 102  12/22/20 93  12/16/20 87   Pain Assessment Pain Score: 0-No pain/10  In general this is a well appearing African American female in no acute distress. She's alert and oriented x4 and appropriate throughout the examination. Cardiopulmonary assessment is negative for acute distress and she exhibits normal effort.     ECOG = 1  0 - Asymptomatic (Fully active, able to carry on all predisease activities without restriction)  1 - Symptomatic but completely ambulatory (Restricted in physically strenuous activity but ambulatory and able to carry out work of a light or sedentary nature. For example, light housework, office work)  2 - Symptomatic, <50% in bed during the day (Ambulatory and capable of all self care but unable to carry out any work activities. Up and about more than 50% of waking hours)  3 - Symptomatic, >50% in bed, but not bedbound (Capable of only limited self-care, confined to bed or chair 50% or more of waking hours)  4 - Bedbound (Completely disabled. Cannot carry on any self-care. Totally  confined to bed or chair)  5 - Death   Eustace Pen MM, Creech RH, Tormey DC, et al. [phone]). "Toxicity and response criteria of the [facility]". [place]ing Oncol. 5 (6): 649-55    LABORATORY DATA:  Lab Results  Component Value Date   WBC 4.7 12/22/2020   HGB 11.2 (L) 12/22/2020   HCT 35.1 (L) 12/22/2020   MCV 86.2 12/22/2020   PLT 179 12/22/2020   Lab Results  Component Value Date   NA 135 12/22/2020   K 3.7 12/22/2020   CL 103 12/22/2020   CO2 25 12/22/2020   Lab Results  Component Value Date   ALT 210 (H) 12/22/2020   AST  119 (H) 12/22/2020   ALKPHOS 78 12/22/2020   BILITOT 0.4 12/22/2020      RADIOGRAPHY: DG Chest Portable 1 View  Result Date: 12/13/2020 CLINICAL DATA:  Fever, on chemotherapy EXAM: PORTABLE CHEST 1 VIEW COMPARISON:  None. FINDINGS: The heart size and mediastinal contours are within normal limits. Both lungs are clear. No pleural effusion. Right chest wall port catheter tip overlies SVC the visualized skeletal structures are unremarkable. IMPRESSION: No acute process in the chest. Electronically Signed   By: Macy Mis M.D.   On: 12/13/2020 09:06   US Abdomen Limited RUQ (LIVER/GB)  Result Date: 12/15/2020 CLINICAL DATA:  Elevated liver function studies. EXAM: ULTRASOUND ABDOMEN LIMITED RIGHT UPPER QUADRANT COMPARISON:  CT scan 08/21/2020 FINDINGS: Gallbladder: No gallstones or wall thickening visualized. No sonographic Murphy sign noted by sonographer. Common bile duct: Diameter: 4.0 mm Liver: Normal echogenicity without focal lesion or biliary dilatation. Portal vein is patent on color Doppler imaging with normal direction of blood flow towards the liver. Other: None. IMPRESSION: Unremarkable right upper quadrant ultrasound examination. Electronically Signed   By: Marijo Sanes M.D.   On: 12/15/2020 08:10       IMPRESSION/PLAN: 1. Stage IIIC, [id number], adenocarcinoma of the rectum. Dr. Lisbeth Renshaw discusses the pathology findings and  reviews the nature of rectal carcinoma. We discussed the risks, benefits, short, and long term effects of radiotherapy, as well as the curative intent, and the patient is interested in proceeding. She is aware of the rationale for surgical resection with Dr. Dema Severin following radiotherapy. Dr. Lisbeth Renshaw discusses the delivery and logistics of radiotherapy and anticipates a course of 5 1/2 weeks of radiotherapy. Written consent is obtained and placed in the chart, a copy was provided to the patient. She will be contacted by our simulation staff. We anticipate starting chemoRT the week of 01/23/21. 2. Contraceptive Counseling. The patient is not sexually active and knows that if she became active prior to radiation she would need to have testing prior to simulation or treatment.  In a visit lasting 60 minutes, greater than 50% of the time was spent face to face discussing the patient's condition, in preparation for the discussion, and coordinating the patient's care.   The above documentation reflects my direct findings during this shared patient visit. Please see the separate note by Dr. Lisbeth Renshaw on this date for the remainder of the patient's plan of care.    Carola Rhine, [facility]   **Disclaimer: This note was dictated with voice recognition software. Similar sounding words can inadvertently be transcribed and this note may contain transcription errors which may not have been corrected upon publication of note.**

## 2021-01-03 NOTE — Telephone Encounter (Signed)
Oral Chemotherapy Pharmacist Encounter   Judy spoke with Ms. Pyka on 01/02/21 and set her up a delivery of her pegfilgrastim-apgf (Nyvepria) injection. Her injection will be delivered tomorrow 01/04/21.   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  Darl Pikes, PharmD, BCPS, BCOP, CPP Hematology/Oncology Clinical Pharmacist [facility]/AP Oral [facility] [phone]  01/03/2021 4:12 PM

## 2021-01-04 ENCOUNTER — Encounter: Payer: Self-pay | Admitting: *Deleted

## 2021-01-04 NOTE — Progress Notes (Signed)
[place] CSW Psychosocial Distress Screening   Social Work was referred by distress screening protocol.  The patient scored a 5 on the Psychosocial Distress Thermometer which indicates moderate distress. Social Work Theatre manager contacted patient by phone to assess for distress and other psychosocial needs.   Patient did not answer and voicemail box was full, so intern was not able to leave a message. A member of CSW team will try calling back.   ONCBCN DISTRESS SCREENING 01/03/2021  Screening Type Initial Screening  Distress experienced in past week (1-10) 5  Practical problem type Work/school  Emotional problem type Adjusting to illness  Physical Problem type Sleep/insomnia;Loss of appetitie;Tingling hands/feet;Skin dry/itchy   Rosary Lively, Social Work Intern Supervised by Gwinda Maine, LCSW

## 2021-01-05 ENCOUNTER — Inpatient Hospital Stay: Payer: 59

## 2021-01-05 ENCOUNTER — Other Ambulatory Visit: Payer: Self-pay

## 2021-01-05 ENCOUNTER — Inpatient Hospital Stay: Payer: 59 | Admitting: Oncology

## 2021-01-05 ENCOUNTER — Inpatient Hospital Stay: Payer: 59 | Attending: Oncology

## 2021-01-05 VITALS — BP 113/79 | HR 87 | Temp 97.6°F | Resp 18 | Ht 65.75 in | Wt 166.0 lb

## 2021-01-05 DIAGNOSIS — C20 Malignant neoplasm of rectum: Secondary | ICD-10-CM | POA: Diagnosis not present

## 2021-01-05 DIAGNOSIS — Z5111 Encounter for antineoplastic chemotherapy: Secondary | ICD-10-CM | POA: Diagnosis not present

## 2021-01-05 LAB — CMP (CANCER CENTER ONLY)
ALT: 83 U/L — ABNORMAL HIGH (ref 0–44)
AST: 33 U/L (ref 15–41)
Albumin: 4.2 g/dL (ref 3.5–5.0)
Alkaline Phosphatase: 74 U/L (ref 38–126)
Anion gap: 8 (ref 5–15)
BUN: 8 mg/dL (ref 6–20)
CO2: 26 mmol/L (ref 22–32)
Calcium: 9.4 mg/dL (ref 8.9–10.3)
Chloride: 105 mmol/L (ref 98–111)
Creatinine: 0.61 mg/dL (ref 0.44–1.00)
GFR, Estimated: >60 mL/min (ref >60)
Glucose, Bld: 91 mg/dL (ref 70–99)
Potassium: 3.8 mmol/L (ref 3.5–5.1)
Sodium: 139 mmol/L (ref 135–145)
Total Bilirubin: 0.5 mg/dL (ref 0.3–1.2)
Total Protein: 7 g/dL (ref 6.5–8.1)

## 2021-01-05 LAB — CBC WITH DIFFERENTIAL (CANCER CENTER ONLY)
Abs Immature Granulocytes: 0.03 10*3/uL (ref 0.00–0.07)
Basophils Absolute: 0 10*3/uL (ref 0.0–0.1)
Basophils Relative: 1 %
Eosinophils Absolute: 0.1 10*3/uL (ref 0.0–0.5)
Eosinophils Relative: 1 %
HCT: 34.5 % — ABNORMAL LOW (ref 36.0–46.0)
Hemoglobin: 10.9 g/dL — ABNORMAL LOW (ref 12.0–15.0)
Immature Granulocytes: 1 %
Lymphocytes Relative: 46 %
Lymphs Abs: 2.9 10*3/uL (ref 0.7–4.0)
MCH: 27.7 pg (ref 26.0–34.0)
MCHC: 31.6 g/dL (ref 30.0–36.0)
MCV: 87.8 fL (ref 80.0–100.0)
Monocytes Absolute: 0.9 10*3/uL (ref 0.1–1.0)
Monocytes Relative: 15 %
Neutro Abs: 2.3 10*3/uL (ref 1.7–7.7)
Neutrophils Relative %: 36 %
Platelet Count: 207 10*3/uL (ref 150–400)
RBC: 3.93 MIL/uL (ref 3.87–5.11)
RDW: 17 % — ABNORMAL HIGH (ref 11.5–15.5)
WBC Count: 6.3 10*3/uL (ref 4.0–10.5)
nRBC: 0 % (ref 0.0–0.2)

## 2021-01-05 MED ORDER — DEXTROSE 5 % IV SOLN: Freq: Once | INTRAVENOUS | Status: AC

## 2021-01-05 MED ORDER — FLUCONAZOLE 100 MG PO TABS: 100.0000 mg | ORAL_TABLET | Freq: Every day | ORAL | 0 refills | Status: DC

## 2021-01-05 MED ORDER — PALONOSETRON HCL INJECTION 0.25 MG/5ML
0.2500 mg | Freq: Once | INTRAVENOUS | Status: AC
  Administered 2021-01-05: 0.25 mg via INTRAVENOUS
  Filled 2021-01-05: qty 5

## 2021-01-05 MED ORDER — LEUCOVORIN CALCIUM INJECTION 350 MG
400.0000 mg/m2 | Freq: Once | INTRAVENOUS | Status: AC
  Administered 2021-01-05: 744 mg via INTRAVENOUS
  Filled 2021-01-05: qty 37.2

## 2021-01-05 MED ORDER — MAGIC MOUTHWASH: 5.0000 mL | Freq: Four times a day (QID) | ORAL | 0 refills | Status: DC | PRN

## 2021-01-05 MED ORDER — OXALIPLATIN CHEMO INJECTION 100 MG/20ML
85.0000 mg/m2 | Freq: Once | INTRAVENOUS | Status: AC
  Administered 2021-01-05: 160 mg via INTRAVENOUS
  Filled 2021-01-05: qty 32

## 2021-01-05 MED ORDER — SODIUM CHLORIDE 0.9% FLUSH: 10.0000 mL | INTRAVENOUS | Status: DC | PRN

## 2021-01-05 MED ORDER — SODIUM CHLORIDE 0.9 % IV SOLN
2400.0000 mg/m2 | INTRAVENOUS | Status: DC
  Administered 2021-01-05: 4450 mg via INTRAVENOUS
  Filled 2021-01-05: qty 89

## 2021-01-05 MED ORDER — DIPHENHYDRAMINE HCL 50 MG/ML IJ SOLN
25.0000 mg | Freq: Once | INTRAMUSCULAR | Status: AC
  Administered 2021-01-05: 25 mg via INTRAVENOUS
  Filled 2021-01-05: qty 1

## 2021-01-05 MED ORDER — FLUOROURACIL CHEMO INJECTION 2.5 GM/50ML
400.0000 mg/m2 | Freq: Once | INTRAVENOUS | Status: AC
  Administered 2021-01-05: 750 mg via INTRAVENOUS
  Filled 2021-01-05: qty 15

## 2021-01-05 MED ORDER — FAMOTIDINE 20 MG IN NS 100 ML IVPB
20.0000 mg | Freq: Once | INTRAVENOUS | Status: AC
  Administered 2021-01-05: 20 mg via INTRAVENOUS
  Filled 2021-01-05: qty 100

## 2021-01-05 MED ORDER — SODIUM CHLORIDE 0.9 % IV SOLN
10.0000 mg | Freq: Once | INTRAVENOUS | Status: AC
  Administered 2021-01-05: 10 mg via INTRAVENOUS
  Filled 2021-01-05: qty 1

## 2021-01-05 NOTE — Patient Instructions (Signed)
[facility]  Discharge Instructions: Thank you for choosing [facility] to provide your oncology and hematology care.   If you have a lab appointment with the [facility], please go directly to the [facility] and check in at the registration area.   Wear comfortable clothing and clothing appropriate for easy access to any Portacath or PICC line.   We strive to give you quality time with your provider. You may need to reschedule your appointment if you arrive late (15 or more minutes).  Arriving late affects you and other patients whose appointments are after yours.  Also, if you miss three or more appointments without notifying the office, you may be dismissed from the clinic at the provider's discretion.      For prescription refill requests, have your pharmacy contact our office and allow 72 hours for refills to be completed.    Today you received the following chemotherapy and/or immunotherapy agents oxaliplatin, leucovorin, fluorouracil      To help prevent nausea and vomiting after your treatment, we encourage you to take your nausea medication as directed.  BELOW ARE SYMPTOMS THAT SHOULD BE REPORTED IMMEDIATELY: *FEVER GREATER THAN 100.4 F (38 C) OR HIGHER *CHILLS OR SWEATING *NAUSEA AND VOMITING THAT IS NOT CONTROLLED WITH YOUR NAUSEA MEDICATION *UNUSUAL SHORTNESS OF BREATH *UNUSUAL BRUISING OR BLEEDING *URINARY PROBLEMS (pain or burning when urinating, or frequent urination) *BOWEL PROBLEMS (unusual diarrhea, constipation, pain near the anus) TENDERNESS IN MOUTH AND THROAT WITH OR WITHOUT PRESENCE OF ULCERS (sore throat, sores in mouth, or a toothache) UNUSUAL RASH, SWELLING OR PAIN  UNUSUAL VAGINAL DISCHARGE OR ITCHING   Items with * indicate a potential emergency and should be followed up as soon as possible or go to the Emergency Department if any problems should occur.  Please show the CHEMOTHERAPY ALERT CARD or IMMUNOTHERAPY  ALERT CARD at check-in to the Emergency Department and triage nurse.  Should you have questions after your visit or need to cancel or reschedule your appointment, please contact [facility]  Dept: [phone]  and follow the prompts.  Office hours are 8:00 a.m. to 4:30 p.m. Monday - Friday. Please note that voicemails left after 4:00 p.m. may not be returned until the following business day.  We are closed weekends and major holidays. You have access to a nurse at all times for urgent questions. Please call the main number to the clinic Dept: [phone] and follow the prompts.   For any non-urgent questions, you may also contact your provider using MyChart. We now offer e-Visits for anyone 76 and older to request care online for non-urgent symptoms. For details visit [email or web address].   Also download the MyChart app! Go to the app store, search "MyChart", open the app, select [facility], and log in with your MyChart username and password.  Due to Covid, a mask is required upon entering the hospital/clinic. If you do not have a mask, one will be given to you upon arrival. For doctor visits, patients may have 1 support person aged 34 or older with them. For treatment visits, patients cannot have anyone with them due to current Covid guidelines and our immunocompromised population.   Oxaliplatin Injection What is this medication? OXALIPLATIN (ox AL i PLA tin) is a chemotherapy drug. It targets fast dividing cells, like cancer cells, and causes these cells to die. This medicine is used to treat cancers of the colon and rectum, and many other cancers. This  medicine may be used for other purposes; ask your health care provider or pharmacist if you have questions. COMMON BRAND NAME(S): Eloxatin What should I tell my care team before I take this medication? They need to know if you have any of these conditions: heart disease history of irregular heartbeat liver  disease low blood counts, like white cells, platelets, or red blood cells lung or breathing disease, like asthma take medicines that treat or prevent blood clots tingling of the fingers or toes, or other nerve disorder an unusual or allergic reaction to oxaliplatin, other chemotherapy, other medicines, foods, dyes, or preservatives pregnant or trying to get pregnant breast-feeding How should I use this medication? This drug is given as an infusion into a vein. It is administered in a hospital or clinic by a specially trained health care professional. Talk to your pediatrician regarding the use of this medicine in children. Special care may be needed. Overdosage: If you think you have taken too much of this medicine contact a poison control center or emergency room at once. NOTE: This medicine is only for you. Do not share this medicine with others. What if I miss a dose? It is important not to miss a dose. Call your doctor or health care professional if you are unable to keep an appointment. What may interact with this medication? Do not take this medicine with any of the following medications: cisapride dronedarone pimozide thioridazine This medicine may also interact with the following medications: aspirin and aspirin-like medicines certain medicines that treat or prevent blood clots like warfarin, apixaban, dabigatran, and rivaroxaban cisplatin cyclosporine diuretics medicines for infection like acyclovir, adefovir, amphotericin B, bacitracin, cidofovir, foscarnet, ganciclovir, gentamicin, pentamidine, vancomycin NSAIDs, medicines for pain and inflammation, like ibuprofen or naproxen other medicines that prolong the QT interval (an abnormal heart rhythm) pamidronate zoledronic acid This list may not describe all possible interactions. Give your health care provider a list of all the medicines, herbs, non-prescription drugs, or dietary supplements you use. Also tell them if you  smoke, drink alcohol, or use illegal drugs. Some items may interact with your medicine. What should I watch for while using this medication? Your condition will be monitored carefully while you are receiving this medicine. You may need blood work done while you are taking this medicine. This medicine may make you feel generally unwell. This is not uncommon as chemotherapy can affect healthy cells as well as cancer cells. Report any side effects. Continue your course of treatment even though you feel ill unless your healthcare professional tells you to stop. This medicine can make you more sensitive to cold. Do not drink cold drinks or use ice. Cover exposed skin before coming in contact with cold temperatures or cold objects. When out in cold weather wear warm clothing and cover your mouth and nose to warm the air that goes into your lungs. Tell your doctor if you get sensitive to the cold. Do not become pregnant while taking this medicine or for 9 months after stopping it. Women should inform their health care professional if they wish to become pregnant or think they might be pregnant. Men should not father a child while taking this medicine and for 6 months after stopping it. There is potential for serious side effects to an unborn child. Talk to your health care professional for more information. Do not breast-feed a child while taking this medicine or for 3 months after stopping it. This medicine has caused ovarian failure in some women. This medicine  may make it more difficult to get pregnant. Talk to your health care professional if you are concerned about your fertility. This medicine has caused decreased sperm counts in some men. This may make it more difficult to father a child. Talk to your health care professional if you are concerned about your fertility. This medicine may increase your risk of getting an infection. Call your health care professional for advice if you get a fever, chills, or  sore throat, or other symptoms of a cold or flu. Do not treat yourself. Try to avoid being around people who are sick. Avoid taking medicines that contain aspirin, acetaminophen, ibuprofen, naproxen, or ketoprofen unless instructed by your health care professional. These medicines may hide a fever. Be careful brushing or flossing your teeth or using a toothpick because you may get an infection or bleed more easily. If you have any dental work done, tell your dentist you are receiving this medicine. What side effects may I notice from receiving this medication? Side effects that you should report to your doctor or health care professional as soon as possible: allergic reactions like skin rash, itching or hives, swelling of the face, lips, or tongue breathing problems cough low blood counts - this medicine may decrease the number of white blood cells, red blood cells, and platelets. You may be at increased risk for infections and bleeding nausea, vomiting pain, redness, or irritation at site where injected pain, tingling, numbness in the hands or feet signs and symptoms of bleeding such as bloody or black, tarry stools; red or dark brown urine; spitting up blood or brown material that looks like coffee grounds; red spots on the skin; unusual bruising or bleeding from the eyes, gums, or nose signs and symptoms of a dangerous change in heartbeat or heart rhythm like chest pain; dizziness; fast, irregular heartbeat; palpitations; feeling faint or lightheaded; falls signs and symptoms of infection like fever; chills; cough; sore throat; pain or trouble passing urine signs and symptoms of liver injury like dark yellow or brown urine; general ill feeling or flu-like symptoms; light-colored stools; loss of appetite; nausea; right upper belly pain; unusually weak or tired; yellowing of the eyes or skin signs and symptoms of low red blood cells or anemia such as unusually weak or tired; feeling faint or  lightheaded; falls signs and symptoms of muscle injury like dark urine; trouble passing urine or change in the amount of urine; unusually weak or tired; muscle pain; back pain Side effects that usually do not require medical attention (report to your doctor or health care professional if they continue or are bothersome): changes in taste diarrhea gas hair loss loss of appetite mouth sores This list may not describe all possible side effects. Call your doctor for medical advice about side effects. You may report side effects to FDA at 1-800-FDA-1088. Where should I keep my medication? This drug is given in a hospital or clinic and will not be stored at home. NOTE: This sheet is a summary. It may not cover all possible information. If you have questions about this medicine, talk to your doctor, pharmacist, or health care provider.  2022 Elsevier/Gold Standard (2018-06-11 12:20:35)  Leucovorin injection What is this medication? LEUCOVORIN (loo koe VOR in) is used to prevent or treat the harmful effects of some medicines. This medicine is used to treat anemia caused by a low amount of folic acid in the body. It is also used with 5-fluorouracil (5-FU) to treat colon cancer. This medicine may  be used for other purposes; ask your health care provider or pharmacist if you have questions. What should I tell my care team before I take this medication? They need to know if you have any of these conditions: anemia from low levels of vitamin B-12 in the blood an unusual or allergic reaction to leucovorin, folic acid, other medicines, foods, dyes, or preservatives pregnant or trying to get pregnant breast-feeding How should I use this medication? This medicine is for injection into a muscle or into a vein. It is given by a health care professional in a hospital or clinic setting. Talk to your pediatrician regarding the use of this medicine in children. Special care may be needed. Overdosage: If you  think you have taken too much of this medicine contact a poison control center or emergency room at once. NOTE: This medicine is only for you. Do not share this medicine with others. What if I miss a dose? This does not apply. What may interact with this medication? capecitabine fluorouracil phenobarbital phenytoin primidone trimethoprim-sulfamethoxazole This list may not describe all possible interactions. Give your health care provider a list of all the medicines, herbs, non-prescription drugs, or dietary supplements you use. Also tell them if you smoke, drink alcohol, or use illegal drugs. Some items may interact with your medicine. What should I watch for while using this medication? Your condition will be monitored carefully while you are receiving this medicine. This medicine may increase the side effects of 5-fluorouracil, 5-FU. Tell your doctor or health care professional if you have diarrhea or mouth sores that do not get better or that get worse. What side effects may I notice from receiving this medication? Side effects that you should report to your doctor or health care professional as soon as possible: allergic reactions like skin rash, itching or hives, swelling of the face, lips, or tongue breathing problems fever, infection mouth sores unusual bleeding or bruising unusually weak or tired Side effects that usually do not require medical attention (report to your doctor or health care professional if they continue or are bothersome): constipation or diarrhea loss of appetite nausea, vomiting This list may not describe all possible side effects. Call your doctor for medical advice about side effects. You may report side effects to FDA at 1-800-FDA-1088. Where should I keep my medication? This drug is given in a hospital or clinic and will not be stored at home. NOTE: This sheet is a summary. It may not cover all possible information. If you have questions about this  medicine, talk to your doctor, pharmacist, or health care provider.  2022 Elsevier/Gold Standard (2007-07-29 16:50:29)  Fluorouracil, 5-FU injection What is this medication? FLUOROURACIL, 5-FU (flure oh YOOR a sil) is a chemotherapy drug. It slows the growth of cancer cells. This medicine is used to treat many types of cancer like breast cancer, colon or rectal cancer, pancreatic cancer, and stomach cancer. This medicine may be used for other purposes; ask your health care provider or pharmacist if you have questions. COMMON BRAND NAME(S): Adrucil What should I tell my care team before I take this medication? They need to know if you have any of these conditions: blood disorders dihydropyrimidine dehydrogenase (DPD) deficiency infection (especially a virus infection such as chickenpox, cold sores, or herpes) kidney disease liver disease malnourished, poor nutrition recent or ongoing radiation therapy an unusual or allergic reaction to fluorouracil, other chemotherapy, other medicines, foods, dyes, or preservatives pregnant or trying to get pregnant breast-feeding How should I  use this medication? This drug is given as an infusion or injection into a vein. It is administered in a hospital or clinic by a specially trained health care professional. Talk to your pediatrician regarding the use of this medicine in children. Special care may be needed. Overdosage: If you think you have taken too much of this medicine contact a poison control center or emergency room at once. NOTE: This medicine is only for you. Do not share this medicine with others. What if I miss a dose? It is important not to miss your dose. Call your doctor or health care professional if you are unable to keep an appointment. What may interact with this medication? Do not take this medicine with any of the following medications: live virus vaccines This medicine may also interact with the following medications: medicines  that treat or prevent blood clots like warfarin, enoxaparin, and dalteparin This list may not describe all possible interactions. Give your health care provider a list of all the medicines, herbs, non-prescription drugs, or dietary supplements you use. Also tell them if you smoke, drink alcohol, or use illegal drugs. Some items may interact with your medicine. What should I watch for while using this medication? Visit your doctor for checks on your progress. This drug may make you feel generally unwell. This is not uncommon, as chemotherapy can affect healthy cells as well as cancer cells. Report any side effects. Continue your course of treatment even though you feel ill unless your doctor tells you to stop. In some cases, you may be given additional medicines to help with side effects. Follow all directions for their use. Call your doctor or health care professional for advice if you get a fever, chills or sore throat, or other symptoms of a cold or flu. Do not treat yourself. This drug decreases your body's ability to fight infections. Try to avoid being around people who are sick. This medicine may increase your risk to bruise or bleed. Call your doctor or health care professional if you notice any unusual bleeding. Be careful brushing and flossing your teeth or using a toothpick because you may get an infection or bleed more easily. If you have any dental work done, tell your dentist you are receiving this medicine. Avoid taking products that contain aspirin, acetaminophen, ibuprofen, naproxen, or ketoprofen unless instructed by your doctor. These medicines may hide a fever. Do not become pregnant while taking this medicine. Women should inform their doctor if they wish to become pregnant or think they might be pregnant. There is a potential for serious side effects to an unborn child. Talk to your health care professional or pharmacist for more information. Do not breast-feed an infant while taking  this medicine. Men should inform their doctor if they wish to father a child. This medicine may lower sperm counts. Do not treat diarrhea with over the counter products. Contact your doctor if you have diarrhea that lasts more than 2 days or if it is severe and watery. This medicine can make you more sensitive to the sun. Keep out of the sun. If you cannot avoid being in the sun, wear protective clothing and use sunscreen. Do not use sun lamps or tanning beds/booths. What side effects may I notice from receiving this medication? Side effects that you should report to your doctor or health care professional as soon as possible: allergic reactions like skin rash, itching or hives, swelling of the face, lips, or tongue low blood counts - this medicine  may decrease the number of white blood cells, red blood cells and platelets. You may be at increased risk for infections and bleeding. signs of infection - fever or chills, cough, sore throat, pain or difficulty passing urine signs of decreased platelets or bleeding - bruising, pinpoint red spots on the skin, black, tarry stools, blood in the urine signs of decreased red blood cells - unusually weak or tired, fainting spells, lightheadedness breathing problems changes in vision chest pain mouth sores nausea and vomiting pain, swelling, redness at site where injected pain, tingling, numbness in the hands or feet redness, swelling, or sores on hands or feet stomach pain unusual bleeding Side effects that usually do not require medical attention (report to your doctor or health care professional if they continue or are bothersome): changes in finger or toe nails diarrhea dry or itchy skin hair loss headache loss of appetite sensitivity of eyes to the light stomach upset unusually teary eyes This list may not describe all possible side effects. Call your doctor for medical advice about side effects. You may report side effects to FDA at  1-800-FDA-1088. Where should I keep my medication? This drug is given in a hospital or clinic and will not be stored at home. NOTE: This sheet is a summary. It may not cover all possible information. If you have questions about this medicine, talk to your doctor, pharmacist, or health care provider.  2022 Elsevier/Gold Standard (2018-12-23 15:00:03)  The chemotherapy medication bag should finish at 46 hours, 96 hours, or 7 days. For example, if your pump is scheduled for 46 hours and it was put on at 4:00 p.m., it should finish at 2:00 p.m. the day it is scheduled to come off regardless of your appointment time.     Estimated time to finish at 12:30pm Saturday January 07, 2021.   If the display on your pump reads "Low Volume" and it is beeping, take the batteries out of the pump and come to the cancer center for it to be taken off.   If the pump alarms go off prior to the pump reading "Low Volume" then call [phone] and someone can assist you.  If the plunger comes out and the chemotherapy medication is leaking out, please use your home chemo spill kit to clean up the spill. Do NOT use paper towels or other household products.  If you have problems or questions regarding your pump, please call either [phone] (24 hours a day) or the cancer center Monday-Friday 8:00 a.m.- 4:30 p.m. at the clinic number and we will assist you. If you are unable to get assistance, then go to the nearest Emergency Department and ask the staff to contact the IV team for assistance.

## 2021-01-05 NOTE — Patient Instructions (Signed)

## 2021-01-05 NOTE — Progress Notes (Signed)
Patient presents for treatment. RN assessment completed along with the following:  Labs/vitals reviewed - Yes, and Per [name], ok to treat with ALT 83.     Weight within 10% of previous measurement - Yes Oncology Treatment Attestation completed for current therapy- Yes, on date 12/22/20 Informed consent completed and reflects current therapy/intent - Yes, on date 09/15/20             Provider progress note reviewed - Patient not seen by provider today. Most recent note dated 12/22/20 reviewed. Treatment/Antibody/Supportive plan reviewed - Yes, and there are no adjustments needed for today's treatment. S&H and other orders reviewed - Yes, and there are no additional orders identified. Previous treatment date reviewed - Yes, and the appropriate amount of time has elapsed between treatments.   Patient to proceed with treatment.

## 2021-01-06 ENCOUNTER — Encounter: Payer: Self-pay | Admitting: *Deleted

## 2021-01-06 NOTE — Progress Notes (Signed)
[facility] Psychosocial Distress Screening Clinical Social Work  Clinical Social Work was referred by distress screening protocol.  The patient scored a 5 on the Psychosocial Distress Thermometer which indicates moderate distress. Clinical Social Worker contacted patient by phone to assess for distress and other psychosocial needs. This is CSW team second attempt.  [facility] DISTRESS SCREENING 01/03/2021  Screening Type Initial Screening  Distress experienced in past week (1-10) 5  Practical problem type Work/school  Emotional problem type Adjusting to illness  Physical Problem type Sleep/insomnia;Loss of appetitie;Tingling hands/feet;Skin dry/itchy    CSW unable to reach patient. Patient's mailbox is full. Please reconsult SW team for any psychosocial needs.  Gwinda Maine, LCSW

## 2021-01-09 ENCOUNTER — Encounter: Payer: Self-pay | Admitting: Oncology

## 2021-01-09 DIAGNOSIS — Z6826 Body mass index (BMI) 26.0-26.9, adult: Secondary | ICD-10-CM | POA: Diagnosis not present

## 2021-01-09 DIAGNOSIS — Z01419 Encounter for gynecological examination (general) (routine) without abnormal findings: Secondary | ICD-10-CM | POA: Diagnosis not present

## 2021-01-09 DIAGNOSIS — Z01411 Encounter for gynecological examination (general) (routine) with abnormal findings: Secondary | ICD-10-CM | POA: Diagnosis not present

## 2021-01-09 DIAGNOSIS — Z124 Encounter for screening for malignant neoplasm of cervix: Secondary | ICD-10-CM | POA: Diagnosis not present

## 2021-01-09 DIAGNOSIS — Z113 Encounter for screening for infections with a predominantly sexual mode of transmission: Secondary | ICD-10-CM | POA: Diagnosis not present

## 2021-01-09 DIAGNOSIS — Z1231 Encounter for screening mammogram for malignant neoplasm of breast: Secondary | ICD-10-CM | POA: Diagnosis not present

## 2021-01-09 DIAGNOSIS — R87810 Cervical high risk human papillomavirus (HPV) DNA test positive: Secondary | ICD-10-CM | POA: Diagnosis not present

## 2021-01-09 DIAGNOSIS — Z3041 Encounter for surveillance of contraceptive pills: Secondary | ICD-10-CM | POA: Diagnosis not present

## 2021-01-11 ENCOUNTER — Ambulatory Visit: Payer: 59 | Admitting: Radiation Oncology

## 2021-01-11 DIAGNOSIS — Z510 Encounter for antineoplastic radiation therapy: Secondary | ICD-10-CM | POA: Insufficient documentation

## 2021-01-11 DIAGNOSIS — C20 Malignant neoplasm of rectum: Secondary | ICD-10-CM | POA: Insufficient documentation

## 2021-01-13 ENCOUNTER — Other Ambulatory Visit: Payer: Self-pay

## 2021-01-13 ENCOUNTER — Ambulatory Visit
Admission: RE | Admit: 2021-01-13 | Discharge: 2021-01-13 | Disposition: A | Discharge status: Home / Self Care | Payer: 59 | Source: Ambulatory Visit | Attending: Radiation Oncology | Admitting: Radiation Oncology

## 2021-01-13 DIAGNOSIS — Z510 Encounter for antineoplastic radiation therapy: Secondary | ICD-10-CM | POA: Diagnosis not present

## 2021-01-13 DIAGNOSIS — C20 Malignant neoplasm of rectum: Secondary | ICD-10-CM | POA: Diagnosis not present

## 2021-01-15 ENCOUNTER — Encounter: Payer: Self-pay | Admitting: Oncology

## 2021-01-16 ENCOUNTER — Other Ambulatory Visit: Payer: Self-pay

## 2021-01-16 ENCOUNTER — Inpatient Hospital Stay: Payer: 59 | Admitting: Oncology

## 2021-01-16 ENCOUNTER — Telehealth: Payer: Self-pay | Admitting: Pharmacist

## 2021-01-16 ENCOUNTER — Inpatient Hospital Stay: Payer: 59

## 2021-01-16 ENCOUNTER — Other Ambulatory Visit (HOSPITAL_COMMUNITY): Payer: Self-pay

## 2021-01-16 VITALS — BP 120/81 | HR 93 | Temp 97.9°F | Resp 18 | Ht 65.0 in | Wt 167.0 lb

## 2021-01-16 DIAGNOSIS — C20 Malignant neoplasm of rectum: Secondary | ICD-10-CM

## 2021-01-16 DIAGNOSIS — Z5111 Encounter for antineoplastic chemotherapy: Secondary | ICD-10-CM | POA: Diagnosis not present

## 2021-01-16 LAB — CBC WITH DIFFERENTIAL (CANCER CENTER ONLY)
Abs Immature Granulocytes: 0.83 10*3/uL — ABNORMAL HIGH (ref 0.00–0.07)
Basophils Absolute: 0.1 10*3/uL (ref 0.0–0.1)
Basophils Relative: 1 %
Eosinophils Absolute: 0.2 10*3/uL (ref 0.0–0.5)
Eosinophils Relative: 1 %
HCT: 33.3 % — ABNORMAL LOW (ref 36.0–46.0)
Hemoglobin: 10.6 g/dL — ABNORMAL LOW (ref 12.0–15.0)
Immature Granulocytes: 5 %
Lymphocytes Relative: 28 %
Lymphs Abs: 4.7 10*3/uL — ABNORMAL HIGH (ref 0.7–4.0)
MCH: 28.1 pg (ref 26.0–34.0)
MCHC: 31.8 g/dL (ref 30.0–36.0)
MCV: 88.3 fL (ref 80.0–100.0)
Monocytes Absolute: 1.8 10*3/uL — ABNORMAL HIGH (ref 0.1–1.0)
Monocytes Relative: 11 %
Neutro Abs: 9 10*3/uL — ABNORMAL HIGH (ref 1.7–7.7)
Neutrophils Relative %: 54 %
Platelet Count: 183 10*3/uL (ref 150–400)
RBC: 3.77 MIL/uL — ABNORMAL LOW (ref 3.87–5.11)
RDW: 16.1 % — ABNORMAL HIGH (ref 11.5–15.5)
WBC Count: 16.6 10*3/uL — ABNORMAL HIGH (ref 4.0–10.5)
nRBC: 0.2 % (ref 0.0–0.2)

## 2021-01-16 LAB — CMP (CANCER CENTER ONLY)
ALT: 33 U/L (ref 0–44)
AST: 23 U/L (ref 15–41)
Albumin: 4 g/dL (ref 3.5–5.0)
Alkaline Phosphatase: 119 U/L (ref 38–126)
Anion gap: 7 (ref 5–15)
BUN: 14 mg/dL (ref 6–20)
CO2: 27 mmol/L (ref 22–32)
Calcium: 8.9 mg/dL (ref 8.9–10.3)
Chloride: 106 mmol/L (ref 98–111)
Creatinine: 0.66 mg/dL (ref 0.44–1.00)
GFR, Estimated: >60 mL/min (ref >60)
Glucose, Bld: 93 mg/dL (ref 70–99)
Potassium: 3.5 mmol/L (ref 3.5–5.1)
Sodium: 140 mmol/L (ref 135–145)
Total Bilirubin: 0.2 mg/dL — ABNORMAL LOW (ref 0.3–1.2)
Total Protein: 6.8 g/dL (ref 6.5–8.1)

## 2021-01-16 NOTE — Telephone Encounter (Signed)
Oral Chemotherapy Pharmacist Encounter  [facility] will deliver medication on 01/18/21. Sharon Bennett knows she is not to get started until her first day of radiation on 01/23/21.  Patient Education I spoke with patient for overview of new oral chemotherapy medication: Xeloda (capecitabine) for the treatment of rectal cancer in conjunction with XRT, planned duration until disease progression or unacceptable drug toxicity the end of XRT.  Pt is doing well. Counseled patient on administration, dosing, side effects, monitoring, drug-food interactions, safe handling, storage, and disposal. Patient will take 3 tablets (1,500 mg total) by mouth 2 (two) times daily after a meal. Take Monday-Friday. Take only on days of radiation.  Side effects include but not limited to: diarrhea, hand-foot syndrome, mouth sores, edema, decreased wbc, fatigue, N/V Diarrhea: suggested patient pick up some loperamide to use if needed Hand-foot syndrome: Suggested the use of [place] Smooth Extra Care 20 Mouth sores: Patient knows to call of Magic Mouthwash if needed  Reviewed with patient importance of keeping a medication schedule and plan for any missed doses.  After discussion with patient no patient barriers to medication adherence identified.   Sharon Bennett voiced understanding and appreciation. All questions answered. Medication handout provided.  Provided patient with [facility] phone number. Patient knows to call the office with questions or concerns. Oral [facility] will continue to follow.  Darl Pikes, PharmD, BCPS, BCOP, CPP Hematology/Oncology Clinical Pharmacist Practitioner [place]/DB/AP [facility] [phone]  01/16/2021 2:59 PM

## 2021-01-16 NOTE — Progress Notes (Signed)
[facility] OFFICE PROGRESS NOTE   Diagnosis: Rectal cancer  INTERVAL HISTORY:   Sharon Bennett completed another cycle of FOLFOX on 01/05/2021.  She received Ziextenzo on day 3.  No nausea/vomiting mouth sores.  She has persistent cold sensitivity and tingling in the extremities.  She has intermittent rectal bleeding.  No bone pain with Ziextenzo. Ms. Markie is scheduled to begin rectal radiation on 01/23/2021.  She reports intermittent episodes of tachycardia.  No dyspnea.  She is working.  Objective:  Vital signs in last 24 hours:  Blood pressure 120/81, pulse 93, temperature 97.9 F (36.6 C), temperature source Oral, resp. rate 18, height 5' 5"  (1.651 m), weight 167 lb (75.8 kg), SpO2 100 %.    HEENT: No thrush or ulcers Resp: Lungs clear bilaterally Cardio: Regular rate and rhythm GI: Nontender, no hepatosplenomegaly Vascular: No leg edema  Skin:  mild hyperpigmentation of the soles, no erythema  Portacath/PICC-without erythema  Lab Results:  Lab Results  Component Value Date   WBC 16.6 (H) 01/16/2021   HGB 10.6 (L) 01/16/2021   HCT 33.3 (L) 01/16/2021   MCV 88.3 01/16/2021   PLT 183 01/16/2021   NEUTROABS 9.0 (H) 01/16/2021    CMP  Lab Results  Component Value Date   NA 140 01/16/2021   K 3.5 01/16/2021   CL 106 01/16/2021   CO2 27 01/16/2021   GLUCOSE 93 01/16/2021   BUN 14 01/16/2021   CREATININE 0.66 01/16/2021   CALCIUM 8.9 01/16/2021   PROT 6.8 01/16/2021   ALBUMIN 4.0 01/16/2021   AST 23 01/16/2021   ALT 33 01/16/2021   ALKPHOS 119 01/16/2021   BILITOT 0.2 (L) 01/16/2021   GFRNONAA >60 01/16/2021    Lab Results  Component Value Date   CEA 1.48 09/15/2020    No results found for: INR, LABPROT  Imaging:  No results found.  Medications: I have reviewed the patient's current medications.   Assessment/Plan: Rectal cancer  CT abdomen/pelvis 08/21/2020-large rectal mass measuring 4.4 x 2.6 x 5.5 cm, haziness in the mesorectal  fat, obscuration of the fat plane between the mass and the adjacent posterior aspect of the uterus, numerous prominent mesorectal lymph nodes measuring up to 5 mm Colonoscopy 08/23/2020-fungating infiltrative nonobstructing large mass found in the rectum.  Mass was noncircumferential, measured 5 cm in length, 3 cm in diameter and was 7 cm from the anal verge.  No bleeding was present.   Pathology showed invasive colonic adenocarcinoma, moderately differentiated.  No evidence of mismatch repair deficiency. Chest CT 09/01/2020-no evidence of metastatic disease MRI pelvis 09/02/2020-tumor at 10.4 cm from anal verge, 5.9 cm from the internal anal sphincter, T3, approximately 15 greater than 5 mm lymph nodes, N2b, multiple uterine fibroids Cycle 1 FOLFOX 09/15/2020 Cycle 2 FOLFOX 09/29/2020 Cycle 3 FOLFOX 10/13/2020 Cycle 4 FOLFOX 10/27/2020 Cycle 5 FOLFOX 11/10/2020 Cycle 6 FOLFOX 11/24/2020 Cycle 7 FOLFOX 12/08/2020 Cycle 8 FOLFOX 01/05/2021, Ziextenzo 2.   rectal bleeding, change in bowel habits secondary to #1 3.   Uterine fibroids noted on MRI pelvis 09/02/2020 4.   Port-A-Cath placement 09/09/2020, Interventional Radiology 5.  Admission 12/14/2020 with febrile neutropenia, E. coli bacteremia-completed an outpatient course of Augmentin       Disposition: Ms. Oshields has completed the course of neoadjuvant FOLFOX.  The plan is to begin concurrent capecitabine and radiation on 01/23/2021.  We reviewed potential toxicities associated with capecitabine again today.  She agrees to proceed.  She will return for an office and lab visit  during the week of 02/07/2020.  She will seek medical attention for recurrent tachycardia.  [name], MD  01/16/2021  11:59 AM

## 2021-01-16 NOTE — Telephone Encounter (Signed)
Oral Oncology Patient Advocate Encounter  I spoke with Sharon Bennett this afternoon to set up delivery of Xeloda.  Address verified for shipment.  Xeloda will be filled through [facility] and mailed 01/17/21 for delivery 01/18/21.  Patient aware to start with radiation, 01/23/21.  Sharon Bennett will call 7-10 days before next refill is due to complete adherence call and set up delivery of medication.     [facility] [place] Phone [phone] Fax [phone] 01/16/2021 2:57 PM

## 2021-01-17 ENCOUNTER — Other Ambulatory Visit (HOSPITAL_COMMUNITY): Payer: Self-pay

## 2021-01-20 DIAGNOSIS — Z510 Encounter for antineoplastic radiation therapy: Secondary | ICD-10-CM | POA: Diagnosis not present

## 2021-01-20 DIAGNOSIS — C20 Malignant neoplasm of rectum: Secondary | ICD-10-CM | POA: Diagnosis not present

## 2021-01-23 ENCOUNTER — Ambulatory Visit (HOSPITAL_BASED_OUTPATIENT_CLINIC_OR_DEPARTMENT_OTHER): Payer: 59 | Admitting: Family Medicine

## 2021-01-23 ENCOUNTER — Ambulatory Visit
Admission: RE | Admit: 2021-01-23 | Discharge: 2021-01-23 | Disposition: A | Discharge status: Home / Self Care | Payer: 59 | Source: Ambulatory Visit | Attending: Radiation Oncology | Admitting: Radiation Oncology

## 2021-01-23 DIAGNOSIS — Z510 Encounter for antineoplastic radiation therapy: Secondary | ICD-10-CM | POA: Diagnosis not present

## 2021-01-23 DIAGNOSIS — C20 Malignant neoplasm of rectum: Secondary | ICD-10-CM | POA: Diagnosis not present

## 2021-01-24 ENCOUNTER — Ambulatory Visit
Admission: RE | Admit: 2021-01-24 | Discharge: 2021-01-24 | Disposition: A | Discharge status: Home / Self Care | Payer: 59 | Source: Ambulatory Visit | Attending: Radiation Oncology | Admitting: Radiation Oncology

## 2021-01-24 ENCOUNTER — Other Ambulatory Visit: Payer: Self-pay

## 2021-01-24 DIAGNOSIS — Z510 Encounter for antineoplastic radiation therapy: Secondary | ICD-10-CM | POA: Diagnosis not present

## 2021-01-24 DIAGNOSIS — C20 Malignant neoplasm of rectum: Secondary | ICD-10-CM | POA: Diagnosis not present

## 2021-01-25 ENCOUNTER — Ambulatory Visit
Admission: RE | Admit: 2021-01-25 | Discharge: 2021-01-25 | Disposition: A | Discharge status: Home / Self Care | Payer: 59 | Source: Ambulatory Visit | Attending: Radiation Oncology | Admitting: Radiation Oncology

## 2021-01-25 DIAGNOSIS — Z510 Encounter for antineoplastic radiation therapy: Secondary | ICD-10-CM | POA: Diagnosis not present

## 2021-01-25 DIAGNOSIS — C20 Malignant neoplasm of rectum: Secondary | ICD-10-CM | POA: Diagnosis not present

## 2021-01-26 ENCOUNTER — Ambulatory Visit
Admission: RE | Admit: 2021-01-26 | Discharge: 2021-01-26 | Disposition: A | Discharge status: Home / Self Care | Payer: 59 | Source: Ambulatory Visit | Attending: Radiation Oncology | Admitting: Radiation Oncology

## 2021-01-26 ENCOUNTER — Other Ambulatory Visit: Payer: Self-pay

## 2021-01-26 DIAGNOSIS — Z510 Encounter for antineoplastic radiation therapy: Secondary | ICD-10-CM | POA: Diagnosis not present

## 2021-01-26 DIAGNOSIS — C20 Malignant neoplasm of rectum: Secondary | ICD-10-CM | POA: Diagnosis not present

## 2021-01-27 ENCOUNTER — Ambulatory Visit
Admission: RE | Admit: 2021-01-27 | Discharge: 2021-01-27 | Disposition: A | Discharge status: Home / Self Care | Payer: 59 | Source: Ambulatory Visit | Attending: Radiation Oncology | Admitting: Radiation Oncology

## 2021-01-27 DIAGNOSIS — Z510 Encounter for antineoplastic radiation therapy: Secondary | ICD-10-CM | POA: Diagnosis not present

## 2021-01-27 DIAGNOSIS — R87629 Unspecified abnormal cytological findings in specimens from vagina: Secondary | ICD-10-CM | POA: Diagnosis not present

## 2021-01-27 DIAGNOSIS — C20 Malignant neoplasm of rectum: Secondary | ICD-10-CM | POA: Diagnosis not present

## 2021-01-27 NOTE — Progress Notes (Signed)
Pt here for patient teaching.    Pt given Radiation and You booklet.    Reviewed areas of pertinence such as diarrhea, fatigue, hair loss, nausea and vomiting, sexual and fertility changes, skin changes, and urinary and bladder changes .   Pt able to give teach back of to pat skin, use unscented/gentle soap, use baby wipes, have Imodium on hand, drink plenty of water, and sitz bath,avoid applying anything to skin within 4 hours of treatment.   Pt verbalizes understanding of information given and will contact nursing with any questions or concerns.    [email or web address]

## 2021-01-31 ENCOUNTER — Other Ambulatory Visit (HOSPITAL_COMMUNITY): Payer: Self-pay

## 2021-01-31 ENCOUNTER — Other Ambulatory Visit: Payer: Self-pay

## 2021-01-31 ENCOUNTER — Ambulatory Visit
Admission: RE | Admit: 2021-01-31 | Discharge: 2021-01-31 | Disposition: A | Discharge status: Home / Self Care | Payer: 59 | Source: Ambulatory Visit | Attending: Radiation Oncology | Admitting: Radiation Oncology

## 2021-01-31 DIAGNOSIS — Z510 Encounter for antineoplastic radiation therapy: Secondary | ICD-10-CM | POA: Diagnosis not present

## 2021-01-31 DIAGNOSIS — C20 Malignant neoplasm of rectum: Secondary | ICD-10-CM | POA: Diagnosis not present

## 2021-02-01 ENCOUNTER — Ambulatory Visit
Admission: RE | Admit: 2021-02-01 | Discharge: 2021-02-01 | Disposition: A | Discharge status: Home / Self Care | Payer: 59 | Source: Ambulatory Visit | Attending: Radiation Oncology | Admitting: Radiation Oncology

## 2021-02-01 DIAGNOSIS — Z510 Encounter for antineoplastic radiation therapy: Secondary | ICD-10-CM | POA: Diagnosis not present

## 2021-02-01 DIAGNOSIS — C20 Malignant neoplasm of rectum: Secondary | ICD-10-CM | POA: Diagnosis not present

## 2021-02-02 ENCOUNTER — Other Ambulatory Visit: Payer: Self-pay

## 2021-02-02 ENCOUNTER — Ambulatory Visit
Admission: RE | Admit: 2021-02-02 | Discharge: 2021-02-02 | Disposition: A | Discharge status: Home / Self Care | Payer: 59 | Source: Ambulatory Visit | Attending: Radiation Oncology | Admitting: Radiation Oncology

## 2021-02-02 DIAGNOSIS — C20 Malignant neoplasm of rectum: Secondary | ICD-10-CM | POA: Diagnosis not present

## 2021-02-02 DIAGNOSIS — Z510 Encounter for antineoplastic radiation therapy: Secondary | ICD-10-CM | POA: Diagnosis not present

## 2021-02-03 ENCOUNTER — Ambulatory Visit
Admission: RE | Admit: 2021-02-03 | Discharge: 2021-02-03 | Disposition: A | Discharge status: Home / Self Care | Payer: 59 | Source: Ambulatory Visit | Attending: Radiation Oncology | Admitting: Radiation Oncology

## 2021-02-03 DIAGNOSIS — C20 Malignant neoplasm of rectum: Secondary | ICD-10-CM | POA: Diagnosis not present

## 2021-02-03 DIAGNOSIS — Z510 Encounter for antineoplastic radiation therapy: Secondary | ICD-10-CM | POA: Diagnosis not present

## 2021-02-07 ENCOUNTER — Ambulatory Visit
Admission: RE | Admit: 2021-02-07 | Discharge: 2021-02-07 | Disposition: A | Discharge status: Home / Self Care | Payer: 59 | Source: Ambulatory Visit | Attending: Radiation Oncology | Admitting: Radiation Oncology

## 2021-02-07 ENCOUNTER — Other Ambulatory Visit: Payer: Self-pay

## 2021-02-07 DIAGNOSIS — M542 Cervicalgia: Secondary | ICD-10-CM | POA: Diagnosis not present

## 2021-02-07 DIAGNOSIS — I82C11 Acute embolism and thrombosis of right internal jugular vein: Secondary | ICD-10-CM | POA: Diagnosis not present

## 2021-02-07 DIAGNOSIS — C20 Malignant neoplasm of rectum: Secondary | ICD-10-CM | POA: Insufficient documentation

## 2021-02-07 DIAGNOSIS — Z510 Encounter for antineoplastic radiation therapy: Secondary | ICD-10-CM | POA: Insufficient documentation

## 2021-02-08 ENCOUNTER — Ambulatory Visit
Admission: RE | Admit: 2021-02-08 | Discharge: 2021-02-08 | Disposition: A | Discharge status: Home / Self Care | Payer: 59 | Source: Ambulatory Visit | Attending: Radiation Oncology | Admitting: Radiation Oncology

## 2021-02-08 ENCOUNTER — Other Ambulatory Visit (HOSPITAL_COMMUNITY): Payer: Self-pay

## 2021-02-08 DIAGNOSIS — I82C11 Acute embolism and thrombosis of right internal jugular vein: Secondary | ICD-10-CM | POA: Diagnosis not present

## 2021-02-08 DIAGNOSIS — M542 Cervicalgia: Secondary | ICD-10-CM | POA: Diagnosis not present

## 2021-02-08 DIAGNOSIS — C20 Malignant neoplasm of rectum: Secondary | ICD-10-CM | POA: Diagnosis not present

## 2021-02-08 DIAGNOSIS — Z510 Encounter for antineoplastic radiation therapy: Secondary | ICD-10-CM | POA: Diagnosis not present

## 2021-02-09 ENCOUNTER — Other Ambulatory Visit: Payer: Self-pay | Admitting: Nurse Practitioner

## 2021-02-09 ENCOUNTER — Encounter (HOSPITAL_BASED_OUTPATIENT_CLINIC_OR_DEPARTMENT_OTHER): Payer: Self-pay

## 2021-02-09 ENCOUNTER — Inpatient Hospital Stay: Payer: 59

## 2021-02-09 ENCOUNTER — Ambulatory Visit (INDEPENDENT_AMBULATORY_CARE_PROVIDER_SITE_OTHER): Payer: 59

## 2021-02-09 ENCOUNTER — Other Ambulatory Visit: Payer: Self-pay

## 2021-02-09 ENCOUNTER — Encounter: Payer: Self-pay | Admitting: Oncology

## 2021-02-09 ENCOUNTER — Ambulatory Visit
Admission: RE | Admit: 2021-02-09 | Discharge: 2021-02-09 | Disposition: A | Discharge status: Home / Self Care | Payer: 59 | Source: Ambulatory Visit | Attending: Radiation Oncology | Admitting: Radiation Oncology

## 2021-02-09 ENCOUNTER — Other Ambulatory Visit (HOSPITAL_COMMUNITY): Payer: Self-pay

## 2021-02-09 ENCOUNTER — Encounter: Payer: Self-pay | Admitting: Nurse Practitioner

## 2021-02-09 ENCOUNTER — Inpatient Hospital Stay: Payer: 59 | Attending: Oncology | Admitting: Nurse Practitioner

## 2021-02-09 VITALS — BP 124/93 | HR 96 | Temp 97.9°F | Resp 18 | Ht 65.0 in | Wt 164.6 lb

## 2021-02-09 DIAGNOSIS — C20 Malignant neoplasm of rectum: Secondary | ICD-10-CM | POA: Insufficient documentation

## 2021-02-09 DIAGNOSIS — M542 Cervicalgia: Secondary | ICD-10-CM | POA: Insufficient documentation

## 2021-02-09 DIAGNOSIS — I82C11 Acute embolism and thrombosis of right internal jugular vein: Secondary | ICD-10-CM | POA: Insufficient documentation

## 2021-02-09 DIAGNOSIS — Z510 Encounter for antineoplastic radiation therapy: Secondary | ICD-10-CM | POA: Insufficient documentation

## 2021-02-09 DIAGNOSIS — Z95828 Presence of other vascular implants and grafts: Secondary | ICD-10-CM

## 2021-02-09 LAB — CMP (CANCER CENTER ONLY)
ALT: 45 U/L — ABNORMAL HIGH (ref 0–44)
AST: 26 U/L (ref 15–41)
Albumin: 4.2 g/dL (ref 3.5–5.0)
Alkaline Phosphatase: 66 U/L (ref 38–126)
Anion gap: 9 (ref 5–15)
BUN: 7 mg/dL (ref 6–20)
CO2: 26 mmol/L (ref 22–32)
Calcium: 9 mg/dL (ref 8.9–10.3)
Chloride: 102 mmol/L (ref 98–111)
Creatinine: 0.55 mg/dL (ref 0.44–1.00)
GFR, Estimated: >60 mL/min (ref >60)
Glucose, Bld: 86 mg/dL (ref 70–99)
Potassium: 3.2 mmol/L — ABNORMAL LOW (ref 3.5–5.1)
Sodium: 137 mmol/L (ref 135–145)
Total Bilirubin: 0.4 mg/dL (ref 0.3–1.2)
Total Protein: 7.3 g/dL (ref 6.5–8.1)

## 2021-02-09 LAB — CBC WITH DIFFERENTIAL (CANCER CENTER ONLY)
Abs Immature Granulocytes: 0.01 10*3/uL (ref 0.00–0.07)
Basophils Absolute: 0 10*3/uL (ref 0.0–0.1)
Basophils Relative: 0 %
Eosinophils Absolute: 0.1 10*3/uL (ref 0.0–0.5)
Eosinophils Relative: 3 %
HCT: 31.2 % — ABNORMAL LOW (ref 36.0–46.0)
Hemoglobin: 10.2 g/dL — ABNORMAL LOW (ref 12.0–15.0)
Immature Granulocytes: 0 %
Lymphocytes Relative: 14 %
Lymphs Abs: 0.6 10*3/uL — ABNORMAL LOW (ref 0.7–4.0)
MCH: 28.6 pg (ref 26.0–34.0)
MCHC: 32.7 g/dL (ref 30.0–36.0)
MCV: 87.4 fL (ref 80.0–100.0)
Monocytes Absolute: 0.7 10*3/uL (ref 0.1–1.0)
Monocytes Relative: 16 %
Neutro Abs: 3.1 10*3/uL (ref 1.7–7.7)
Neutrophils Relative %: 67 %
Platelet Count: 159 10*3/uL (ref 150–400)
RBC: 3.57 MIL/uL — ABNORMAL LOW (ref 3.87–5.11)
RDW: 15.1 % (ref 11.5–15.5)
WBC Count: 4.6 10*3/uL (ref 4.0–10.5)
nRBC: 0 % (ref 0.0–0.2)

## 2021-02-09 MED ORDER — APIXABAN 5 MG PO TABS: 5.0000 mg | ORAL_TABLET | Freq: Two times a day (BID) | ORAL | 3 refills | Status: DC

## 2021-02-09 MED ORDER — HEPARIN SOD (PORK) LOCK FLUSH 100 UNIT/ML IV SOLN
500.0000 [IU] | Freq: Once | INTRAVENOUS | Status: AC
  Administered 2021-02-09: 500 [IU] via INTRAVENOUS

## 2021-02-09 MED ORDER — APIXABAN 5 MG PO TABS
5.0000 mg | ORAL_TABLET | Freq: Two times a day (BID) | ORAL | 3 refills | Status: DC
  Filled 2021-02-09: qty 60, 30d supply, fill #0
  Filled 2021-03-08: qty 60, 30d supply, fill #1
  Filled 2021-04-10: qty 60, 30d supply, fill #2

## 2021-02-09 MED ORDER — SODIUM CHLORIDE 0.9% FLUSH
10.0000 mL | Freq: Once | INTRAVENOUS | Status: AC
  Administered 2021-02-09: 10 mL via INTRAVENOUS

## 2021-02-09 NOTE — Progress Notes (Signed)
Sharon Bennett   Diagnosis: Rectal cancer  INTERVAL HISTORY:   Sharon Bennett returns as scheduled.  She began concurrent capecitabine and radiation 01/23/2021.  She has occasional nausea.  No mouth sores.  She is having small frequent bowel movements.  She does not characterize this is diarrhea.  She notes less rectal bleeding.  No hand or foot pain or redness.  Feet with mild intermittent numbness/tingling.  She reports a 1 week history of right neck discomfort, asymmetry when compared to the left.  Objective:  Vital signs in last 24 hours:  Blood pressure (!) 124/93, pulse 96, temperature 97.9 F (36.6 C), temperature source Oral, resp. rate 18, height $RemoveBe'5\' 5"'OxmpSHAqv$  (1.651 m), weight 164 lb 9.6 oz (74.7 kg), SpO2 100 %.    HEENT: Mild white coating of her tongue.  No buccal thrush. Resp: Lungs clear bilaterally. Cardio: Regular rate and rhythm. GI: Abdomen soft and nontender.  No hepatomegaly. Vascular: No leg edema.  Right neck has a full appearance, associated tenderness.  No significant edema of the right arm. Skin: Palms without erythema. Port-A-Cath without erythema.   Lab Results:  Lab Results  Component Value Date   WBC 4.6 02/09/2021   HGB 10.2 (L) 02/09/2021   HCT 31.2 (L) 02/09/2021   MCV 87.4 02/09/2021   PLT 159 02/09/2021   NEUTROABS 3.1 02/09/2021    Imaging:  No results found.  Medications: I have reviewed the patient's current medications.  Assessment/Plan: Rectal cancer  CT abdomen/pelvis 08/21/2020-large rectal mass measuring 4.4 x 2.6 x 5.5 cm, haziness in the mesorectal fat, obscuration of the fat plane between the mass and the adjacent posterior aspect of the uterus, numerous prominent mesorectal lymph nodes measuring up to 5 mm Colonoscopy 08/23/2020-fungating infiltrative nonobstructing large mass found in the rectum.  Mass was noncircumferential, measured 5 cm in length, 3 cm in diameter and was 7 cm from the anal verge.   No bleeding was present.   Pathology showed invasive colonic adenocarcinoma, moderately differentiated.  No evidence of mismatch repair deficiency. Chest CT 09/01/2020-no evidence of metastatic disease MRI pelvis 09/02/2020-tumor at 10.4 cm from anal verge, 5.9 cm from the internal anal sphincter, T3, approximately 15 greater than 5 mm lymph nodes, N2b, multiple uterine fibroids Cycle 1 FOLFOX 09/15/2020 Cycle 2 FOLFOX 09/29/2020 Cycle 3 FOLFOX 10/13/2020 Cycle 4 FOLFOX 10/27/2020 Cycle 5 FOLFOX 11/10/2020 Cycle 6 FOLFOX 11/24/2020 Cycle 7 FOLFOX 12/08/2020 Cycle 8 FOLFOX 01/05/2021, Ziextenzo Radiation/Xeloda 01/23/2021 2.   rectal bleeding, change in bowel habits secondary to #1 3.   Uterine fibroids noted on MRI pelvis 09/02/2020 4.   Port-A-Cath placement 09/09/2020, Interventional Radiology 5.  Admission 12/14/2020 with febrile neutropenia, E. coli bacteremia-completed an outpatient course of Augmentin      Disposition: Sharon Bennett appears stable.  She is completing neoadjuvant radiation/Xeloda.  She is tolerating Xeloda well.  CBC from today reviewed.  Labs adequate to continue with Xeloda as above.  She reports a 1 week history of right neck discomfort, asymmetry when compared to the left.  We are referring her for a Doppler study to rule out DVT.  We discussed anticoagulation if DVT is confirmed.  She understands the bleeding risk.  She will return for lab and follow-up in 2 weeks.  We are available to see her sooner if needed.    [name] ANP/GNP-BC   02/09/2021  9:08 AM

## 2021-02-09 NOTE — Patient Instructions (Signed)

## 2021-02-09 NOTE — Progress Notes (Unsigned)
Right upper extremity venous Doppler performed and completed.  Results given to Julaine Hua, LPN at the [facility] at 12:30 pm  For results go under CV Imaging tab.  Salvadore Dom, RVT, RDCS, [facility]

## 2021-02-10 ENCOUNTER — Telehealth: Payer: Self-pay

## 2021-02-10 ENCOUNTER — Other Ambulatory Visit: Payer: Self-pay | Admitting: Nurse Practitioner

## 2021-02-10 ENCOUNTER — Ambulatory Visit
Admission: RE | Admit: 2021-02-10 | Discharge: 2021-02-10 | Disposition: A | Discharge status: Home / Self Care | Payer: 59 | Source: Ambulatory Visit | Attending: Radiation Oncology | Admitting: Radiation Oncology

## 2021-02-10 ENCOUNTER — Other Ambulatory Visit (HOSPITAL_COMMUNITY): Payer: Self-pay

## 2021-02-10 ENCOUNTER — Emergency Department (HOSPITAL_BASED_OUTPATIENT_CLINIC_OR_DEPARTMENT_OTHER)
Admission: EM | Admit: 2021-02-10 | Discharge: 2021-02-11 | Disposition: A | Discharge status: Home / Self Care | Payer: 59 | Attending: Emergency Medicine | Admitting: Emergency Medicine

## 2021-02-10 ENCOUNTER — Encounter (HOSPITAL_BASED_OUTPATIENT_CLINIC_OR_DEPARTMENT_OTHER): Payer: Self-pay | Admitting: Obstetrics and Gynecology

## 2021-02-10 ENCOUNTER — Other Ambulatory Visit: Payer: Self-pay

## 2021-02-10 ENCOUNTER — Other Ambulatory Visit: Payer: Self-pay | Admitting: Oncology

## 2021-02-10 DIAGNOSIS — I82C11 Acute embolism and thrombosis of right internal jugular vein: Secondary | ICD-10-CM | POA: Insufficient documentation

## 2021-02-10 DIAGNOSIS — Z85040 Personal history of malignant carcinoid tumor of rectum: Secondary | ICD-10-CM | POA: Diagnosis not present

## 2021-02-10 DIAGNOSIS — C20 Malignant neoplasm of rectum: Secondary | ICD-10-CM | POA: Diagnosis not present

## 2021-02-10 DIAGNOSIS — Z85038 Personal history of other malignant neoplasm of large intestine: Secondary | ICD-10-CM | POA: Insufficient documentation

## 2021-02-10 DIAGNOSIS — M542 Cervicalgia: Secondary | ICD-10-CM | POA: Diagnosis present

## 2021-02-10 DIAGNOSIS — R0602 Shortness of breath: Secondary | ICD-10-CM | POA: Insufficient documentation

## 2021-02-10 DIAGNOSIS — Z7901 Long term (current) use of anticoagulants: Secondary | ICD-10-CM | POA: Insufficient documentation

## 2021-02-10 LAB — CBC WITH DIFFERENTIAL/PLATELET
Abs Immature Granulocytes: 0.02 10*3/uL (ref 0.00–0.07)
Basophils Absolute: 0 10*3/uL (ref 0.0–0.1)
Basophils Relative: 0 %
Eosinophils Absolute: 0.1 10*3/uL (ref 0.0–0.5)
Eosinophils Relative: 1 %
HCT: 32.6 % — ABNORMAL LOW (ref 36.0–46.0)
Hemoglobin: 10.5 g/dL — ABNORMAL LOW (ref 12.0–15.0)
Immature Granulocytes: 0 %
Lymphocytes Relative: 8 %
Lymphs Abs: 0.5 10*3/uL — ABNORMAL LOW (ref 0.7–4.0)
MCH: 28.1 pg (ref 26.0–34.0)
MCHC: 32.2 g/dL (ref 30.0–36.0)
MCV: 87.2 fL (ref 80.0–100.0)
Monocytes Absolute: 0.9 10*3/uL (ref 0.1–1.0)
Monocytes Relative: 16 %
Neutro Abs: 4.1 10*3/uL (ref 1.7–7.7)
Neutrophils Relative %: 75 %
Platelets: 174 10*3/uL (ref 150–400)
RBC: 3.74 MIL/uL — ABNORMAL LOW (ref 3.87–5.11)
RDW: 15 % (ref 11.5–15.5)
WBC: 5.5 10*3/uL (ref 4.0–10.5)
nRBC: 0 % (ref 0.0–0.2)

## 2021-02-10 LAB — COMPREHENSIVE METABOLIC PANEL
ALT: 37 U/L (ref 0–44)
AST: 22 U/L (ref 15–41)
Albumin: 4.1 g/dL (ref 3.5–5.0)
Alkaline Phosphatase: 67 U/L (ref 38–126)
Anion gap: 7 (ref 5–15)
BUN: 6 mg/dL (ref 6–20)
CO2: 26 mmol/L (ref 22–32)
Calcium: 8.9 mg/dL (ref 8.9–10.3)
Chloride: 102 mmol/L (ref 98–111)
Creatinine, Ser: 0.72 mg/dL (ref 0.44–1.00)
GFR, Estimated: >60 mL/min (ref >60)
Glucose, Bld: 105 mg/dL — ABNORMAL HIGH (ref 70–99)
Potassium: 3.9 mmol/L (ref 3.5–5.1)
Sodium: 135 mmol/L (ref 135–145)
Total Bilirubin: 0.6 mg/dL (ref 0.3–1.2)
Total Protein: 7.3 g/dL (ref 6.5–8.1)

## 2021-02-10 LAB — LACTIC ACID, PLASMA: Lactic Acid, Venous: 0.9 mmol/L (ref 0.5–1.9)

## 2021-02-10 MED ORDER — SODIUM CHLORIDE 0.9 % IV BOLUS
1000.0000 mL | Freq: Once | INTRAVENOUS | Status: AC
  Administered 2021-02-10: 1000 mL via INTRAVENOUS

## 2021-02-10 MED ORDER — TRAMADOL HCL 50 MG PO TABS: 50.0000 mg | ORAL_TABLET | Freq: Two times a day (BID) | ORAL | 0 refills | Status: DC | PRN

## 2021-02-10 MED ORDER — ONDANSETRON HCL 4 MG/2ML IJ SOLN
4.0000 mg | Freq: Once | INTRAMUSCULAR | Status: AC
  Administered 2021-02-10: 4 mg via INTRAVENOUS
  Filled 2021-02-10: qty 2

## 2021-02-10 MED ORDER — POTASSIUM CHLORIDE CRYS ER 20 MEQ PO TBCR
20.0000 meq | EXTENDED_RELEASE_TABLET | Freq: Every day | ORAL | 1 refills | Status: DC
  Filled 2021-03-06: qty 30, 30d supply, fill #0

## 2021-02-10 MED ORDER — HYDROMORPHONE HCL 1 MG/ML IJ SOLN
0.5000 mg | Freq: Once | INTRAMUSCULAR | Status: AC
  Administered 2021-02-10: 0.5 mg via INTRAVENOUS
  Filled 2021-02-10: qty 1

## 2021-02-10 NOTE — ED Triage Notes (Signed)
Patient reports to the ER for a clot in her jugular vein on the right side. Patient reports it feels bigger today and she has trouble moving her head. Patient is taking oral chemo and is taking radiation. Patient repots she has colon cancer.

## 2021-02-10 NOTE — Telephone Encounter (Signed)
Called and spoke to the patient to let her know the  FMLA paperwork was ready and at the reception also I called  [facility] to check on the Eliquis cost $10.00 and the prescription was ready to be pick up. Patient voiced understanding of instruction and had no further questions or concerns at this time

## 2021-02-10 NOTE — Telephone Encounter (Signed)
-----   Message from Owens Shark, NP sent at 02/10/2021  1:45 PM EST ----- Please let her know the potassium level was mildly low yesterday.  I sent a prescription to her pharmacy for K-Dur 20 mEq daily.

## 2021-02-10 NOTE — ED Provider Notes (Signed)
[facility] DEPT Provider Note  CSN: [id number] Arrival date & time: 02/10/21 2128  Chief Complaint(s) Neck Pain  HPI Sharon Bennett is a 41 y.o. female with a past medical history listed below including colorectal cancer currently undergoing oral chemotherapy who required a right chest port-a-cath.  She was diagnosed with right IJ thrombus yesterday with ultrasound duplex and placed on Eliquis.   She presents today for worsening right neck pain and swelling.  Pain worse with swallowing.  She denies any known fevers or chills.  Reports mild shortness of breath with exertion has been ongoing for several days.  No chest pain.  No nausea vomiting.  No other physical complaints.  HPI  Past Medical History Past Medical History:  Diagnosis Date   Colorectal cancer ([place])    Family history of brain cancer    Family history of breast cancer    Family history of pancreatic cancer    Patient Active Problem List   Diagnosis Date Noted   Bacteremia 12/15/2020   Elevated transaminase level 12/15/2020   Genetic testing 10/06/2020   Uterine fibroid 09/22/2020   Family history of pancreatic cancer 09/09/2020   Family history of brain cancer 09/09/2020   Family history of breast cancer 09/09/2020   Rectal cancer ([place]) 09/05/2020   Home Medication(s) Prior to Admission medications   Medication Sig Start Date End Date Taking? Authorizing Provider  oxyCODONE (ROXICODONE) 5 MG immediate release tablet Take 0.5-1 tablets (2.5-5 mg total) by mouth every 6 (six) hours as needed for up to 5 days for severe pain. 02/11/21 02/16/21 Yes Guenther Dunshee, Grayce Sessions, MD  acetaminophen (TYLENOL) 500 MG tablet Take 1,000 mg by mouth every 6 (six) hours as needed for mild pain or headache.    [provider]  apixaban (ELIQUIS) 5 MG TABS tablet Take 1 tablet (5 mg total) by mouth 2 (two) times daily. 02/09/21   Owens Shark, NP  aspirin-acetaminophen-caffeine (EXCEDRIN MIGRAINE)  [phone] MG tablet Take 1 tablet by mouth every 6 (six) hours as needed for headache.    [provider]  capecitabine (XELODA) 500 MG tablet Take 3 tablets (1,500 mg total) by mouth 2 (two) times daily after a meal. Take Monday-Friday. Take only on days of radiation. 12/23/20   Ladell Pier, MD  fluconazole (DIFLUCAN) 100 MG tablet Take 1 tablet (100 mg total) by mouth daily. 01/05/21   Ladell Pier, MD  lidocaine-prilocaine (EMLA) cream Apply 1 application topically as needed. Apply 1/2 tablespoon to port site and cover with plastic wrap 2 hours prior to stick to numb site 09/23/20   Ladell Pier, MD  magic mouthwash SOLN Take 5 mLs by mouth 4 (four) times daily as needed for mouth pain. 01/05/21   Ladell Pier, MD  melatonin 5 MG TABS Take 5 mg by mouth at bedtime as needed (sleep).    [provider]  ondansetron (ZOFRAN) 8 MG tablet Take 1 tablet (8 mg total) by mouth every 8 (eight) hours as needed for nausea or vomiting. 09/12/20   Ladell Pier, MD  pantoprazole (PROTONIX) 40 MG tablet TAKE 1 TABLET BY MOUTH EVERY DAY Patient taking differently: Take 40 mg by mouth daily as needed (heartburn). 10/27/20   Ladell Pier, MD  potassium chloride SA (KLOR-CON M) 20 MEQ tablet Take 1 tablet (20 mEq total) by mouth daily. 02/10/21   Owens Shark, NP  prochlorperazine (COMPAZINE) 10 MG tablet Take 1 tablet (10 mg total) by mouth  every 6 (six) hours as needed for nausea or vomiting. 09/12/20   Ladell Pier, MD  SLYND 4 MG TABS Take 1 tablet by mouth daily. 11/28/20   [provider]  traMADol (ULTRAM) 50 MG tablet Take 1 tablet (50 mg total) by mouth every 12 (twelve) hours as needed. Do not drive while taking 05/12/40   Owens Shark, NP                                                                                                                                    Allergies Codeine and Tegaderm ag mesh [silver]  Review of Systems Review of Systems As  noted in HPI  Physical Exam Vital Signs  I have reviewed the triage vital signs BP 115/79    Pulse 100    Temp 99.3 F (37.4 C) (Oral)    Resp 19    Ht 5\' 5"  (1.651 m)    Wt 74.8 kg    SpO2 100%    BMI 27.46 kg/m   Physical Exam Vitals reviewed.  Constitutional:      General: She is not in acute distress.    Appearance: She is well-developed. She is not diaphoretic.  HENT:     Head: Normocephalic and atraumatic.     Right Ear: External ear normal.     Left Ear: External ear normal.     Nose: Nose normal.  Eyes:     General: No scleral icterus.    Conjunctiva/sclera: Conjunctivae normal.  Neck:     Trachea: Phonation normal.   Cardiovascular:     Rate and Rhythm: Regular rhythm.  Pulmonary:     Effort: Pulmonary effort is normal. No respiratory distress.     Breath sounds: No stridor.  Chest:    Abdominal:     General: There is no distension.  Musculoskeletal:     Cervical back: No rigidity. Pain with movement and muscular tenderness present. No spinous process tenderness. Decreased range of motion.  Neurological:     Mental Status: She is alert and oriented to person, place, and time.  Psychiatric:        Behavior: Behavior normal.    ED Results and Treatments Labs (all labs ordered are listed, but only abnormal results are displayed) Labs Reviewed  COMPREHENSIVE METABOLIC PANEL - Abnormal; Notable for the following components:      Result Value   Glucose, Bld 105 (*)    All other components within normal limits  CBC WITH DIFFERENTIAL/PLATELET - Abnormal; Notable for the following components:   RBC 3.74 (*)    Hemoglobin 10.5 (*)    HCT 32.6 (*)    Lymphs Abs 0.5 (*)    All other components within normal limits  URINALYSIS, ROUTINE W REFLEX MICROSCOPIC - Abnormal; Notable for the following components:   Ketones, ur 15 (*)    All other components within normal limits  LACTIC ACID, PLASMA  PREGNANCY, URINE  LACTIC ACID, PLASMA                                                                                                                          EKG  EKG Interpretation  Date/Time:    Ventricular Rate:    PR Interval:    QRS Duration:   QT Interval:    QTC Calculation:   R Axis:     Text Interpretation:         Radiology CT Soft Tissue Neck W Contrast  Result Date: 02/11/2021 CLINICAL DATA:  Right neck swelling and pain EXAM: CT NECK WITH CONTRAST TECHNIQUE: Multidetector CT imaging of the neck was performed using the standard protocol following the bolus administration of intravenous contrast. CONTRAST:  127mL OMNIPAQUE IOHEXOL 350 MG/ML SOLN COMPARISON:  None. FINDINGS: Pharynx and larynx: Normal. No mass or swelling. Salivary glands: No inflammation, mass, or stone. Thyroid: Normal. Lymph nodes: None enlarged or abnormal density. Vascular: The right internal jugular vein is occluded below the C2 level. The lower cervical portion of the internal jugular vein is markedly distended. The left IJ is normal. Limited intracranial: Negative. Visualized orbits: Negative. Mastoids and visualized paranasal sinuses: Clear. Skeleton: No acute or aggressive process. Upper chest: Negative. Other: Diffuse swelling throughout the right neck. IMPRESSION: 1. Thrombosis of the right internal jugular vein below the C2 level, with marked distention of the lower cervical portion of the internal jugular vein. 2. Diffuse swelling throughout the right neck. Electronically Signed   By: Ulyses Jarred M.D.   On: 02/11/2021 01:55   CT Angio Chest PE W and/or Wo Contrast  Result Date: 02/11/2021 CLINICAL DATA:  History of colon cancer. Patient reports to the ER for clot in her jugular vein on the right side. EXAM: CT ANGIOGRAPHY CHEST WITH CONTRAST TECHNIQUE: Multidetector CT imaging of the chest was performed using the standard protocol during bolus administration of intravenous contrast. Multiplanar CT image reconstructions and MIPs were obtained to evaluate the vascular  anatomy. CONTRAST:  160mL OMNIPAQUE IOHEXOL 350 MG/ML SOLN COMPARISON:  August 01, 2020 FINDINGS: Cardiovascular: A right-sided venous Port-A-Cath is in place. An extensive amount of low attenuation is seen throughout the lumen of a dilated right internal jugular vein (approximately 2.6 cm in diameter). The segmental and subsegmental pulmonary arteries are limited in evaluation secondary to overlying areas of streak artifact. No evidence of pulmonary embolism. Normal heart size. No pericardial effusion. Mediastinum/Nodes: No enlarged mediastinal, hilar, or axillary lymph nodes. Thyroid gland, trachea, and esophagus demonstrate no significant findings. Lungs/Pleura: Lungs are clear. No pleural effusion or pneumothorax. Upper Abdomen: There is a small hiatal hernia. Musculoskeletal: No chest wall abnormality. No acute or significant osseous findings. Review of the MIP images confirms the above findings. IMPRESSION: 1. Extensive amount of thrombus throughout the lumen of a dilated right internal jugular vein. 2. No evidence of pulmonary embolism or acute cardiopulmonary disease. 3. Small hiatal hernia. Electronically Signed   By: Virgina Norfolk  M.D.   On: 02/11/2021 01:58   CT VENOGRAM HEAD  Result Date: 02/11/2021 CLINICAL DATA:  Right internal jugular vein thrombosis EXAM: CT VENOGRAM HEAD TECHNIQUE: Venographic phase images of the brain were obtained following the administration of intravenous contrast. Multiplanar reformats and maximum intensity projections were generated. CONTRAST:  130mL OMNIPAQUE IOHEXOL 350 MG/ML SOLN COMPARISON:  None. FINDINGS: Brain: There is no mass, hemorrhage or extra-axial collection. The size and configuration of the ventricles and extra-axial CSF spaces are normal. The brain parenchyma is normal, without acute or chronic infarction. Vascular: No abnormal hyperdensity of the major intracranial arteries or dural venous sinuses. No intracranial atherosclerosis. Superior sagittal  sinus: Normal. Straight sinus: Normal. Inferior sagittal sinus, vein of Galen and internal cerebral veins: Normal. Transverse sinuses: Normal. Sigmoid sinuses: Normal. Visualized jugular veins: Normal. Skull: The visualized skull base, calvarium and extracranial soft tissues are normal. Sinuses/Orbits: No fluid levels or advanced mucosal thickening of the visualized paranasal sinuses. No mastoid or middle ear effusion. The orbits are normal. IMPRESSION: Normal head CT and venogram. Electronically Signed   By: Ulyses Jarred M.D.   On: 02/11/2021 01:58    Pertinent labs & imaging results that were available during my care of the patient were reviewed by me and considered in my medical decision making (see MDM for details).  Medications Ordered in ED Medications  HYDROmorphone (DILAUDID) injection 0.5 mg (0.5 mg Intravenous Given 02/10/21 2328)  ondansetron (ZOFRAN) injection 4 mg (4 mg Intravenous Given 02/10/21 2328)  sodium chloride 0.9 % bolus 1,000 mL (1,000 mLs Intravenous New Bag/Given 02/10/21 2327)  iohexol (OMNIPAQUE) 350 MG/ML injection 125 mL (120 mLs Intravenous Contrast Given 02/11/21 0120)                                                                                                                                     Procedures Procedures  (including critical care time)  Medical Decision Making / ED Course     Known right IJ thrombus now with worsening swelling and pain. Patient is in no respiratory distress without stridor.  Given the worsening features, will obtain screening labs and assess the extent of the thrombus with more detailed imaging.  We will also assess for possible pulmonary embolism.   In the interim patient will be provided with IV fluids, and IV pain medicine.  Nausea medicine given to prevent nausea from narcotics.  Labs independently interpreted by me listed below; No leukocytosis.  Stable hemoglobin.  No significant electrolyte derangement or renal  sufficiency.  Imaging independently interpreted by me listed below: CT venogram negative for venous sinus thrombosis CT neck notable for large right IJ clot without evidence of airway compromise. CTA PE negative for PEs  Imaging confirmed by radiologist noted above.  I discussed the case with Dr. Virl Cagey from vascular surgery who mentioned that this does not require a thrombectomy at this time.  He did recommend patient follow-up regarding  need for possible Port-A-Cath removal.  If the Eliquis is not effective in treating the clot he recommends transitioning to Lovenox given her history of colorectal cancer.  Patient was started on the Eliquis by her oncologist.  It has only been 2 days of treatment.  She has not failed management with Eliquis at this time.  She she does not require admission to the hospital for IV heparin/anticoagulation.  Recommend she follow-up closely with her oncologist.   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 Safe for discharge with strict return precautions.  Disposition: Discharge  Condition: Good  I have discussed the results, Dx and Tx plan with the patient/family who expressed understanding and agree(s) with the plan. Discharge instructions discussed at length. The patient/family was given strict return precautions who verbalized understanding of the instructions. No further questions at time of discharge.    ED Discharge Orders          Ordered    oxyCODONE (ROXICODONE) 5 MG immediate release tablet  Every 6 hours PRN        02/11/21 0315            Follow Up: [place], [place], [address] [place] [id number] [phone]  Call  as  needed  Ladell Pier, MD [place]  [id number] [phone]  Call  to schedule an appointment for close follow up           This chart was dictated using voice recognition software.  Despite best efforts to proofread,  errors can occur which can change the documentation meaning.    Fatima Blank, MD 02/11/21 [phone]

## 2021-02-10 NOTE — Telephone Encounter (Signed)
Called and spoke with the patient to let her know the potassium level was mildly low. And there was a prescription sent to pharmacy for K-Dur 20 mEq daily. She stated she was in pain, she put a warm compress and have tylenol. The pain was still a 10. A prescription for tramadol was sent to [facility] for pain.

## 2021-02-11 ENCOUNTER — Emergency Department (HOSPITAL_BASED_OUTPATIENT_CLINIC_OR_DEPARTMENT_OTHER): Payer: 59

## 2021-02-11 DIAGNOSIS — K449 Diaphragmatic hernia without obstruction or gangrene: Secondary | ICD-10-CM | POA: Diagnosis not present

## 2021-02-11 DIAGNOSIS — I82C11 Acute embolism and thrombosis of right internal jugular vein: Secondary | ICD-10-CM | POA: Diagnosis not present

## 2021-02-11 DIAGNOSIS — Z85040 Personal history of malignant carcinoid tumor of rectum: Secondary | ICD-10-CM | POA: Diagnosis not present

## 2021-02-11 DIAGNOSIS — R0602 Shortness of breath: Secondary | ICD-10-CM | POA: Diagnosis not present

## 2021-02-11 DIAGNOSIS — Z85038 Personal history of other malignant neoplasm of large intestine: Secondary | ICD-10-CM | POA: Diagnosis not present

## 2021-02-11 DIAGNOSIS — Z7901 Long term (current) use of anticoagulants: Secondary | ICD-10-CM | POA: Diagnosis not present

## 2021-02-11 LAB — URINALYSIS, ROUTINE W REFLEX MICROSCOPIC
Bilirubin Urine: NEGATIVE
Glucose, UA: NEGATIVE mg/dL
Hgb urine dipstick: NEGATIVE
Ketones, ur: 15 mg/dL — AB
Leukocytes,Ua: NEGATIVE
Nitrite: NEGATIVE
Protein, ur: NEGATIVE mg/dL
Specific Gravity, Urine: 1.016 (ref 1.005–1.030)
pH: 8 (ref 5.0–8.0)

## 2021-02-11 LAB — PREGNANCY, URINE: Preg Test, Ur: NEGATIVE

## 2021-02-11 IMAGING — CT CT ANGIO CHEST
2 of 7 series · 17 of 46 positions shown · IV contrast (omnipaque)
Comparison: [DATE]

CLINICAL DATA: History of colon cancer. Patient reports to the ER
for clot in her jugular vein on the right side.

EXAM:
CT ANGIOGRAPHY CHEST WITH CONTRAST
TECHNIQUE: Multidetector CT imaging of the chest was performed using the
standard protocol during bolus administration of intravenous
contrast. Multiplanar CT image reconstructions and MIPs were
obtained to evaluate the vascular anatomy.
CONTRAST:  120mL OMNIPAQUE IOHEXOL 350 MG/ML SOLN

[Series 5: pe axial thins · axial · 0.63mm/px · z∈[+749,+981]mm · 14 of 268 slices shown]
[im 18/268  lung]
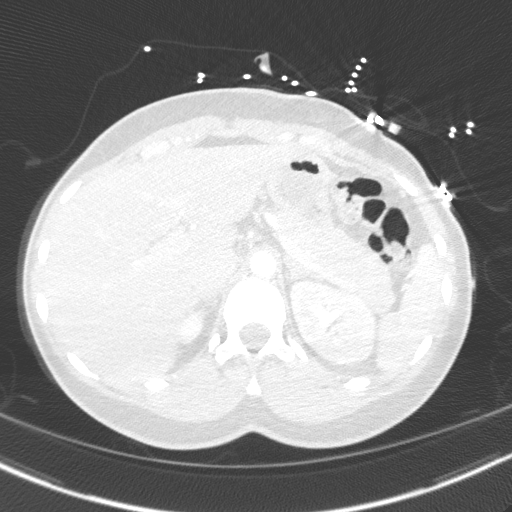
[im 36/268  soft-tissue]
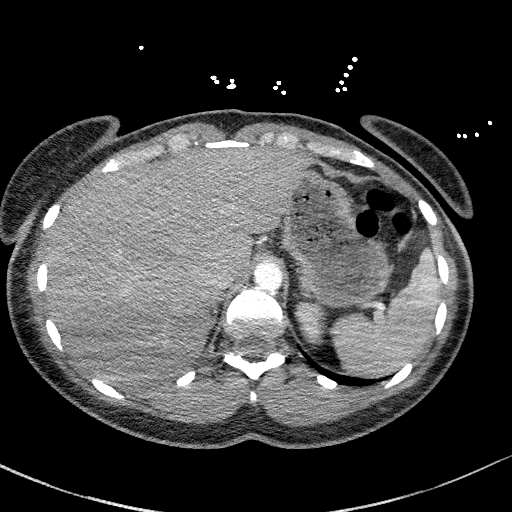
[im 54/268  lung]
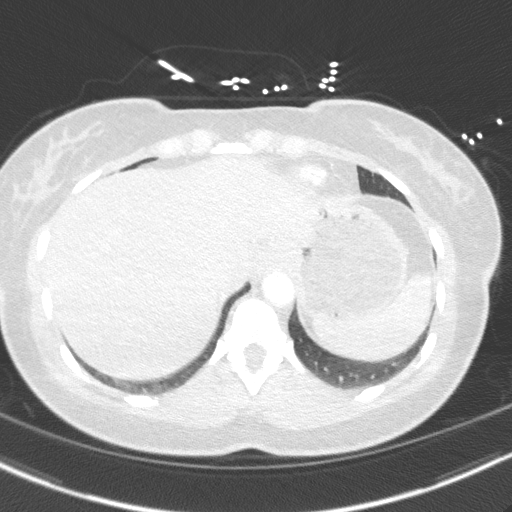
[im 72/268  soft-tissue]
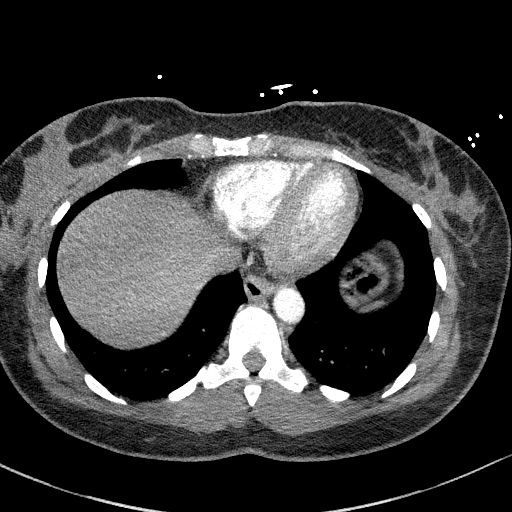
[im 90/268  lung]
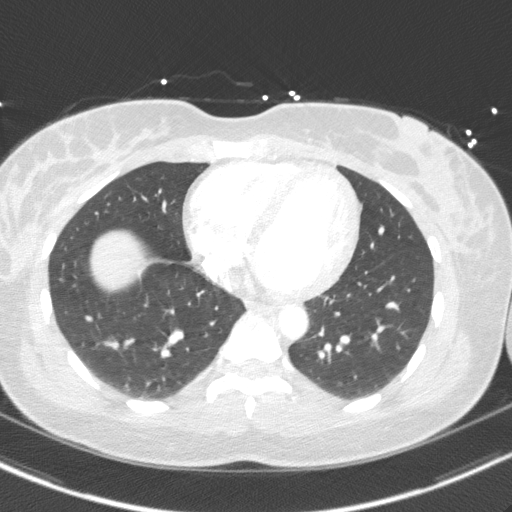
[im 107/268  soft-tissue]
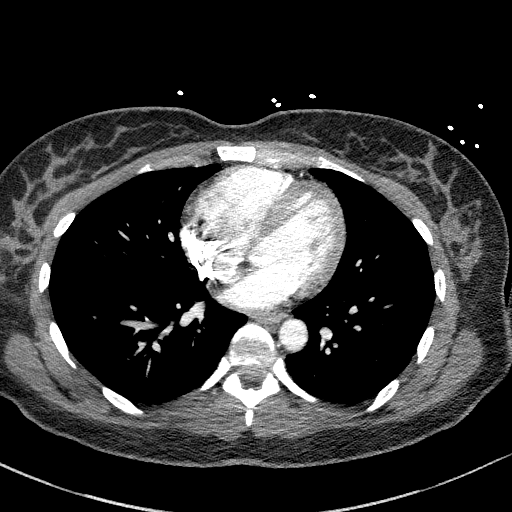
[im 125/268  lung]
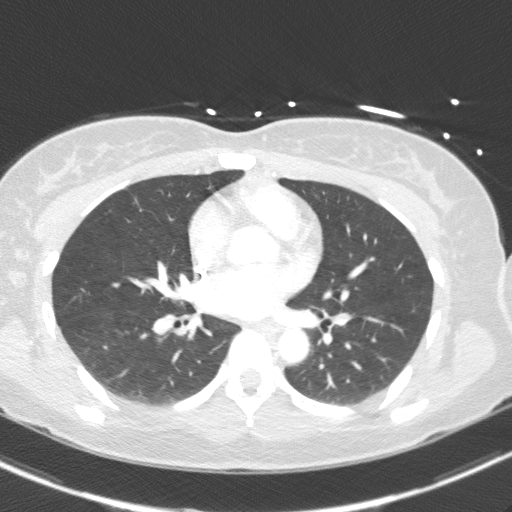
[im 143/268  soft-tissue]
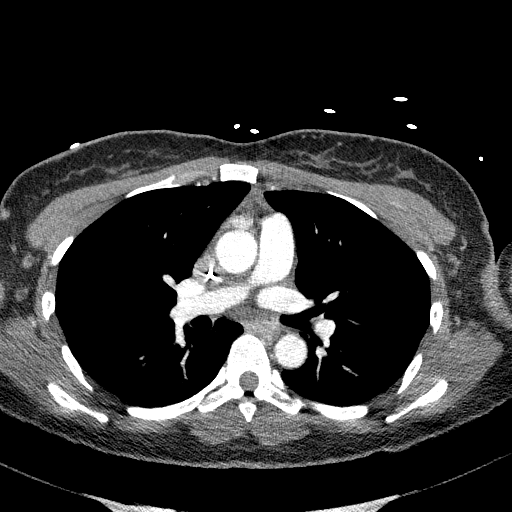
[im 161/268  lung]
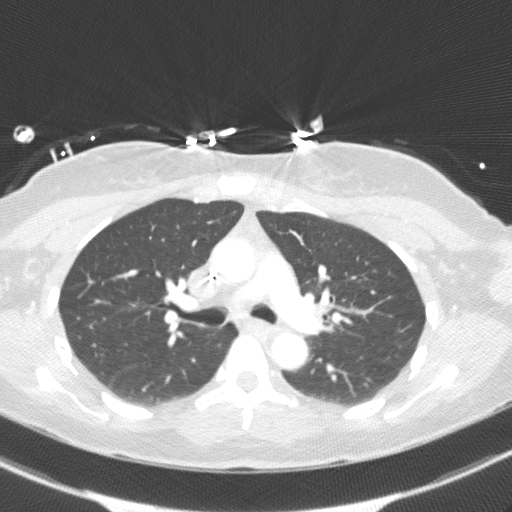
[im 179/268  soft-tissue]
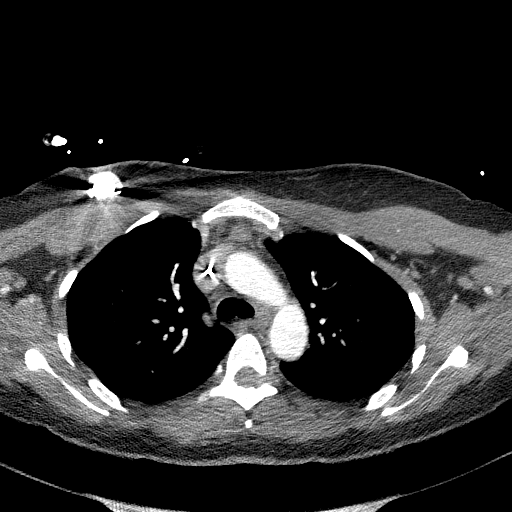
[im 196/268  lung]
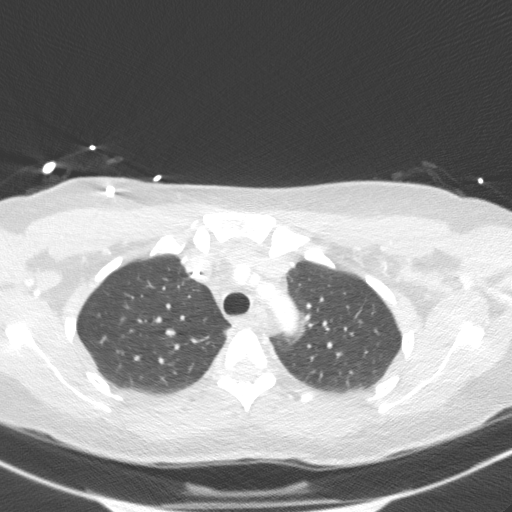
[im 214/268  soft-tissue]
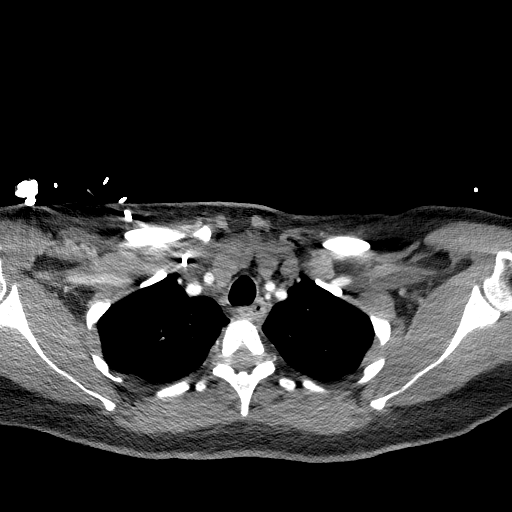
[im 232/268  lung]
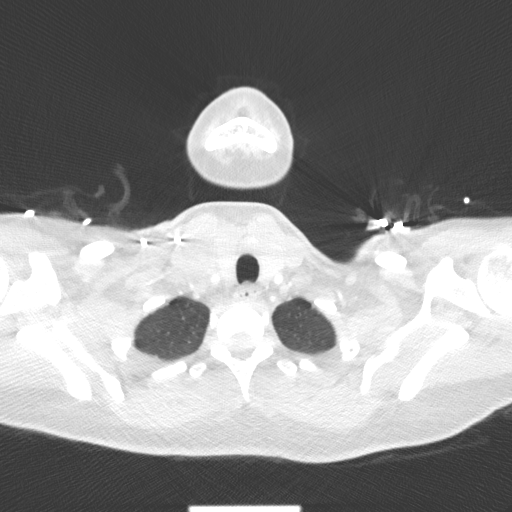
[im 250/268  soft-tissue]
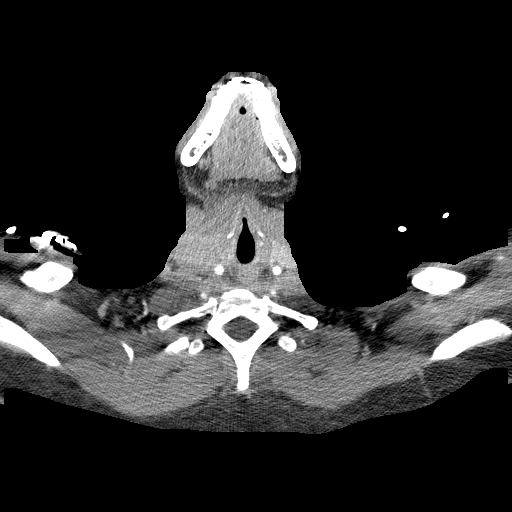

[Series 7: cor soft · coronal · 0.55mm/px · 3 of 127 slices shown]
[im 32/127  soft-tissue]
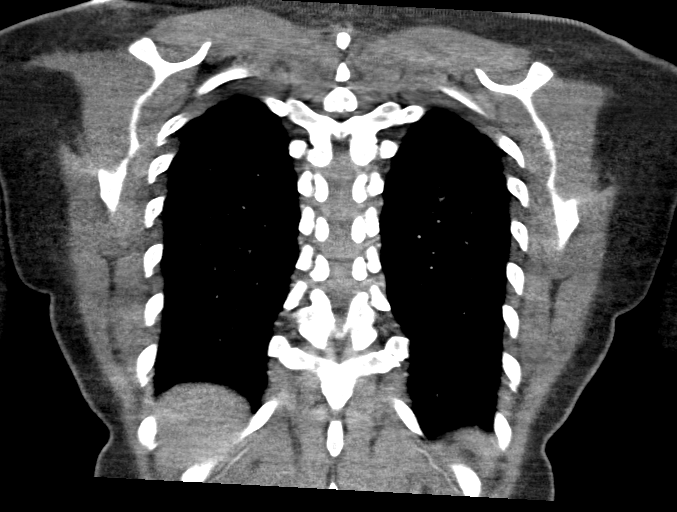
[im 64/127  soft-tissue]
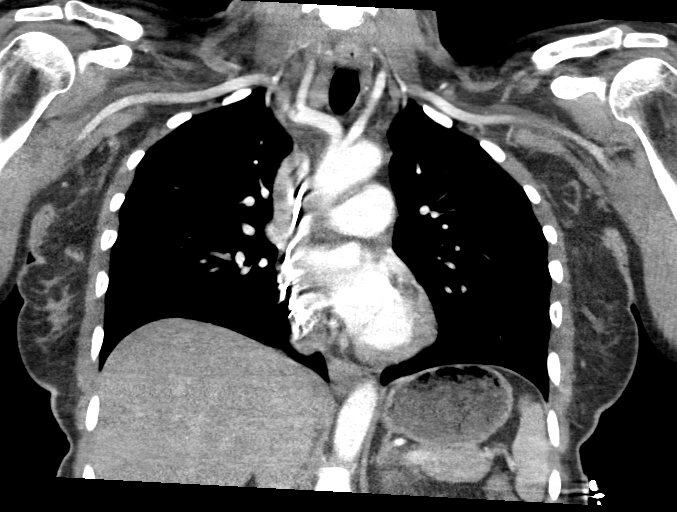
[im 95/127  soft-tissue]
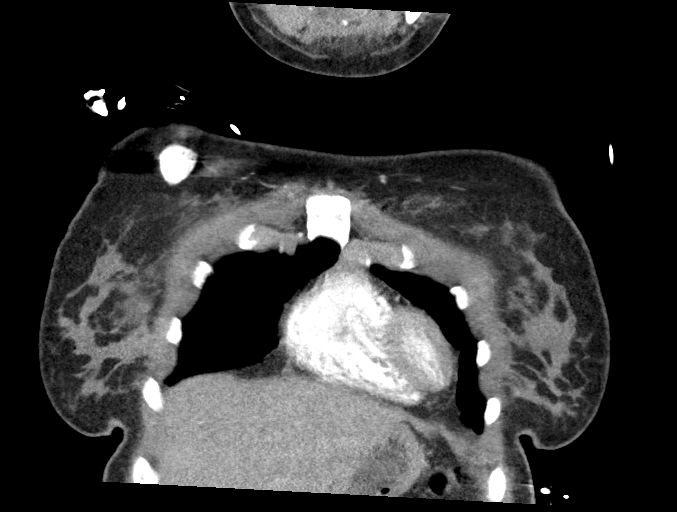

[17 of 46 positions shown; findings below may reference images not displayed]

FINDINGS: Cardiovascular: A right-sided venous Port-A-Cath is in place. An
extensive amount of low attenuation is seen throughout the lumen of
a dilated right internal jugular vein (approximately 2.6 cm in
diameter). The segmental and subsegmental pulmonary arteries are
limited in evaluation secondary to overlying areas of streak
artifact. No evidence of pulmonary embolism. Normal heart size. No
pericardial effusion.

Mediastinum/Nodes: No enlarged mediastinal, hilar, or axillary lymph
nodes. Thyroid gland, trachea, and esophagus demonstrate no
significant findings.

Lungs/Pleura: Lungs are clear. No pleural effusion or pneumothorax.

Upper Abdomen: There is a small hiatal hernia.

Musculoskeletal: No chest wall abnormality. No acute or significant
osseous findings.

Review of the MIP images confirms the above findings.
IMPRESSION: 1. Extensive amount of thrombus throughout the lumen of a dilated
right internal jugular vein.
2. No evidence of pulmonary embolism or acute cardiopulmonary
disease.
3. Small hiatal hernia.

## 2021-02-11 IMAGING — CT CT VENOGRAM HEAD
5 of 8 series · 17 of 47 positions shown · IV contrast (omnipaque)
Comparison: None.

CLINICAL DATA: Right internal jugular vein thrombosis

EXAM:
CT VENOGRAM HEAD
TECHNIQUE: Venographic phase images of the brain were obtained following the
administration of intravenous contrast. Multiplanar reformats and
maximum intensity projections were generated.
CONTRAST:  120mL OMNIPAQUE IOHEXOL 350 MG/ML SOLN

[Series 2: head wo · axial · 0.41mm/px · z∈[+1080,+1130]mm · 2 of 31 slices shown (1 of 2)]
[im 11/31  brain]
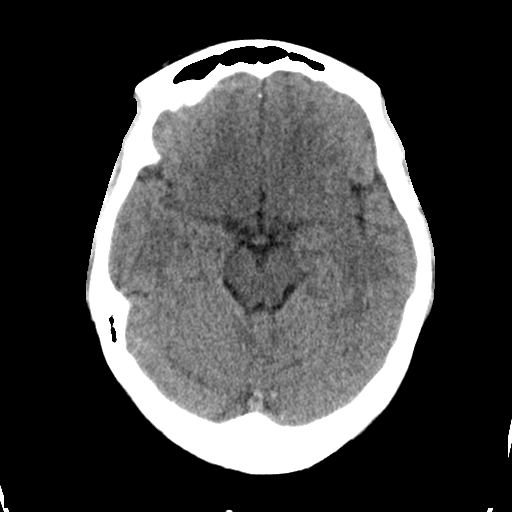
[im 21/31  bone]
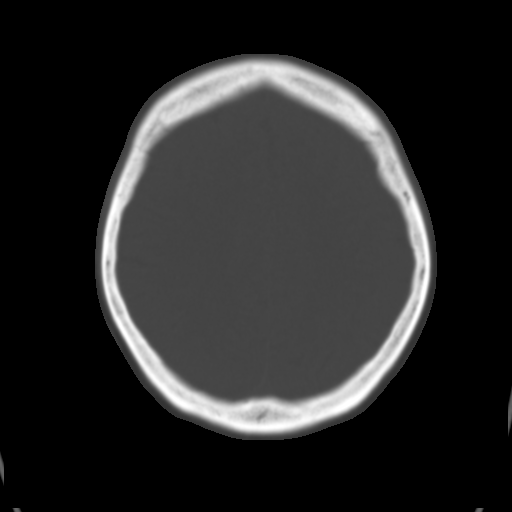

[Series 3: head bone · axial · 0.41mm/px · z∈[+1044,+1166]mm · 8 of 77 slices shown]
[im 8/77  bone]
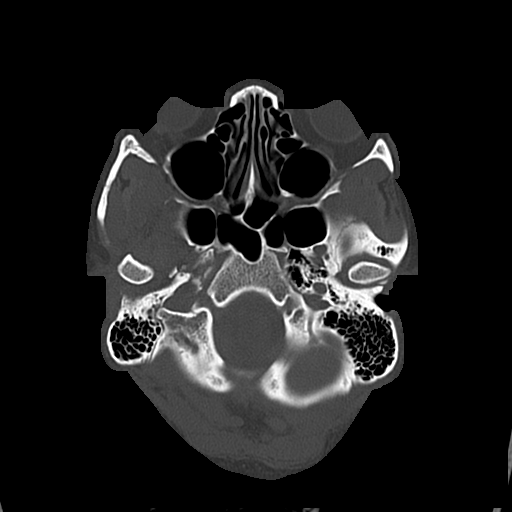
[im 16/77  bone]
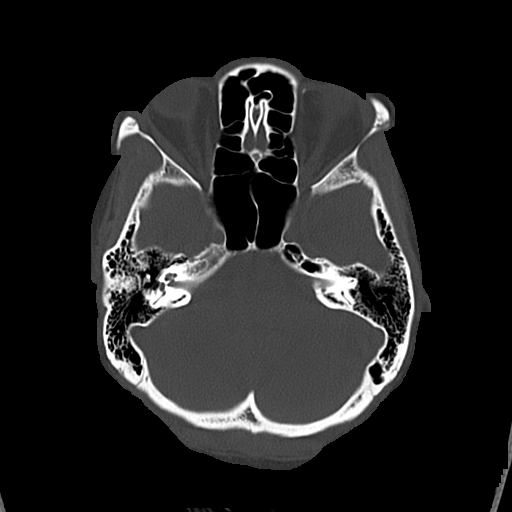
[im 23/77  bone]
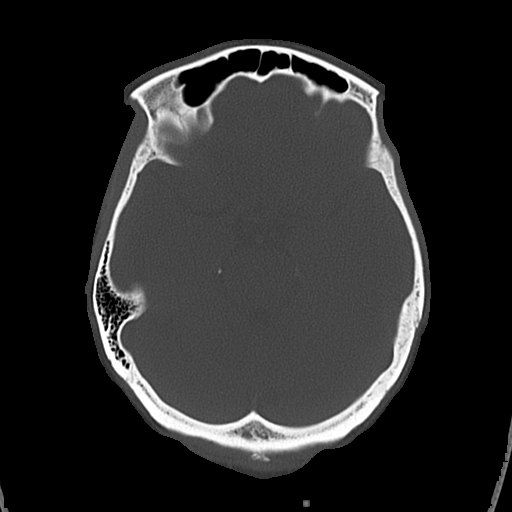
[im 31/77  bone]
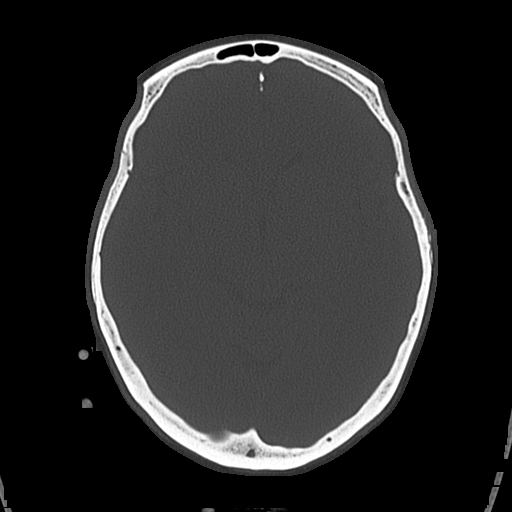
[im 46/77  bone]
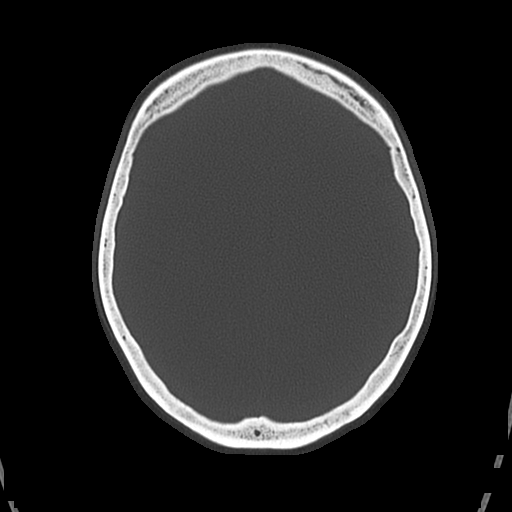
[im 54/77  bone]
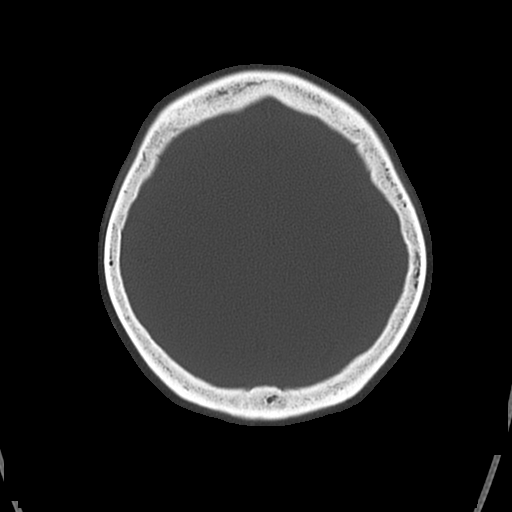
[im 61/77  bone]
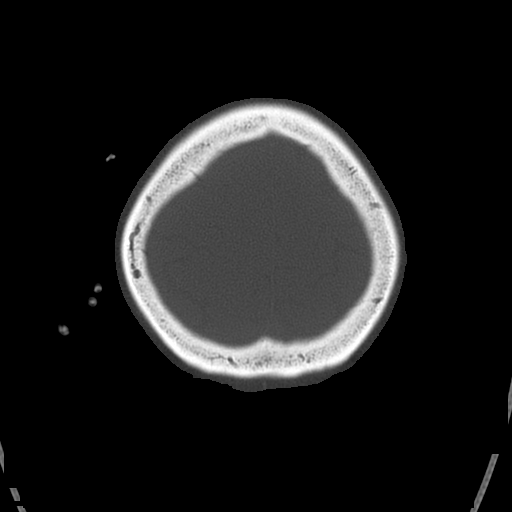
[im 69/77  bone]
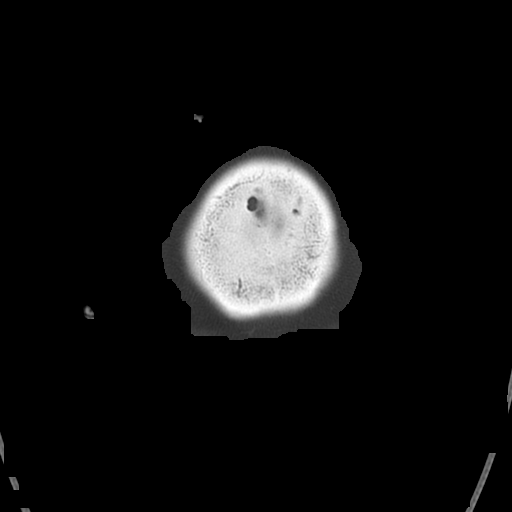

[Series 6: head wo · axial · 0.41mm/px · z∈[+1083,+1133]mm · 2 of 31 slices shown (2 of 2)]
[im 11/31  brain]
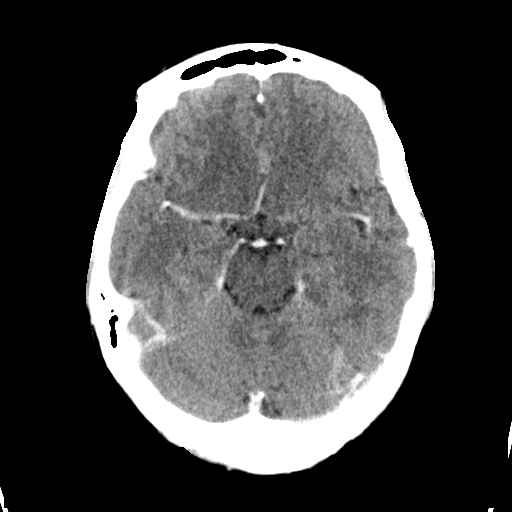
[im 21/31  brain]
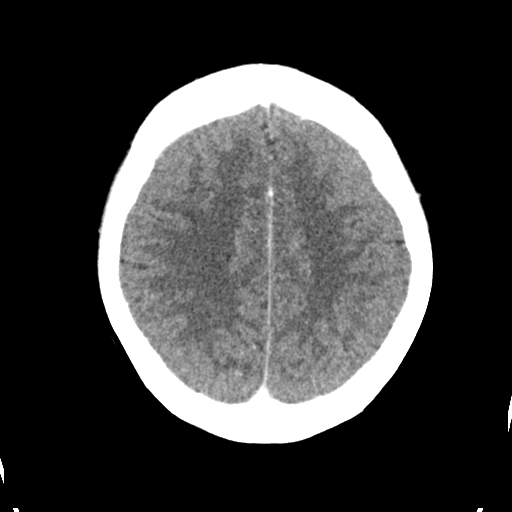

[Series 8: coronal soft · coronal · 0.28mm/px · 3 of 69 slices shown]
[im 18/69  brain]
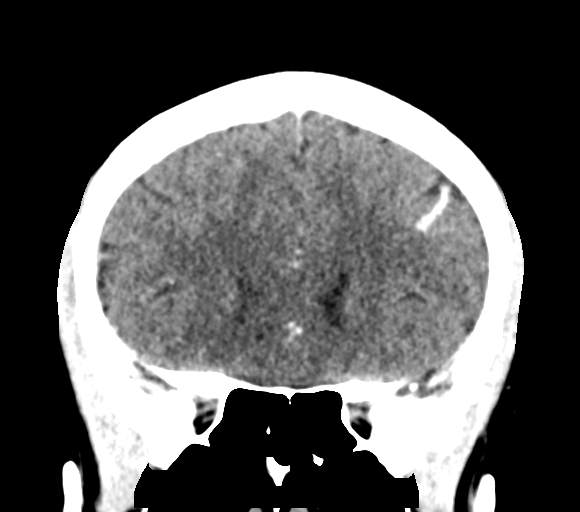
[im 35/69  brain]
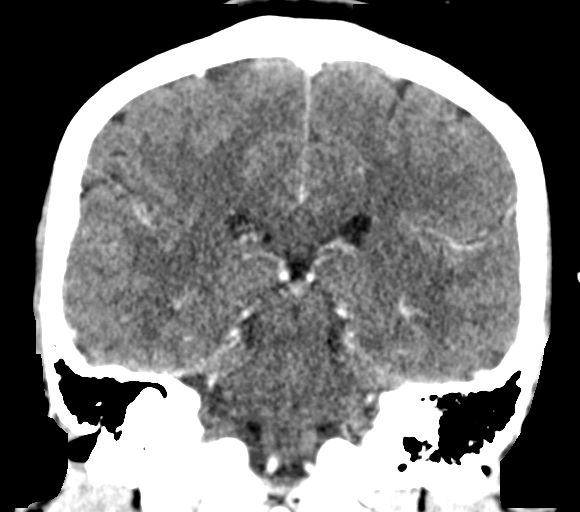
[im 52/69  brain]
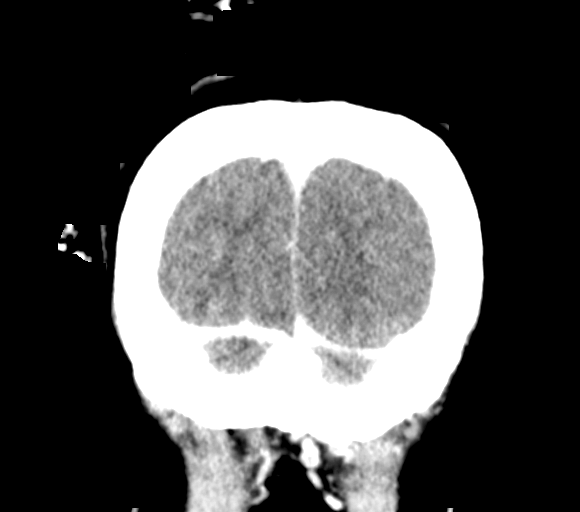

[Series 9: sagittal soft · sagittal · 0.29mm/px · 2 of 55 slices shown]
[im 19/55  brain]
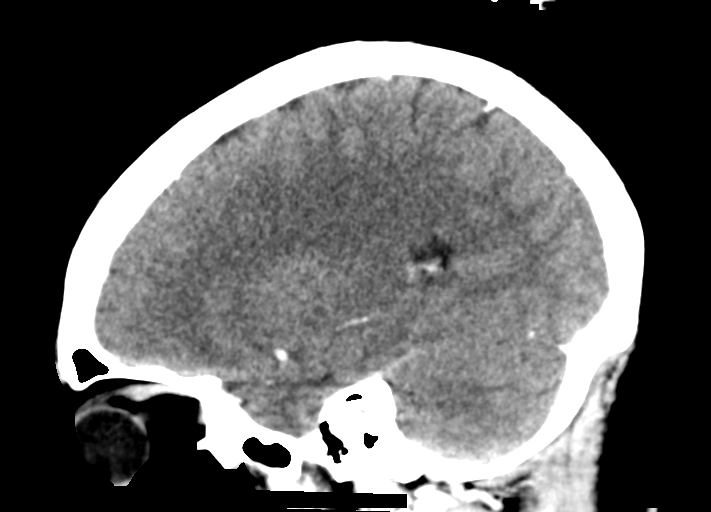
[im 37/55  brain]
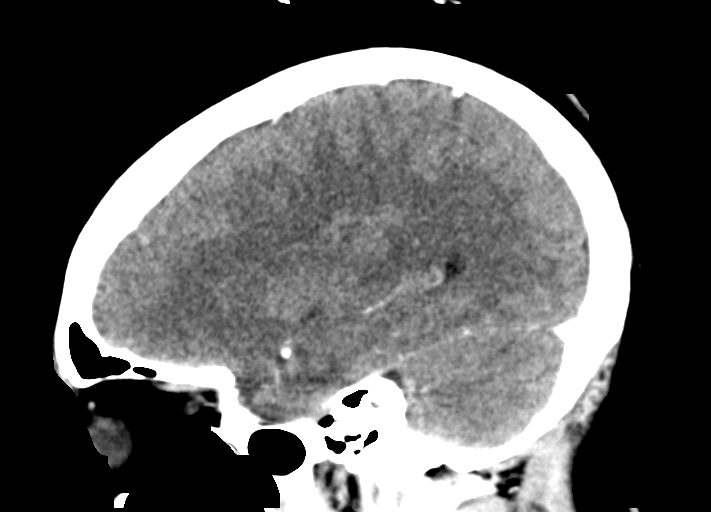

[17 of 47 positions shown; findings below may reference images not displayed]

FINDINGS: Brain: There is no mass, hemorrhage or extra-axial collection. The
size and configuration of the ventricles and extra-axial CSF spaces
are normal. The brain parenchyma is normal, without acute or chronic
infarction.

Vascular: No abnormal hyperdensity of the major intracranial
arteries or dural venous sinuses. No intracranial atherosclerosis.

Superior sagittal sinus: Normal.

Straight sinus: Normal.

Inferior sagittal sinus, vein of FIKIR YEGETA and internal cerebral veins:
Normal.

Transverse sinuses: Normal.

Sigmoid sinuses: Normal.

Visualized jugular veins: Normal.

Skull: The visualized skull base, calvarium and extracranial soft
tissues are normal.

Sinuses/Orbits: No fluid levels or advanced mucosal thickening of
the visualized paranasal sinuses. No mastoid or middle ear effusion.
The orbits are normal.
IMPRESSION: Normal head CT and venogram.

## 2021-02-11 IMAGING — CT CT NECK W/ CM
5 series · 16 of 35 positions shown, 18 images · IV contrast (APPLIED)
Comparison: None.

CLINICAL DATA: Right neck swelling and pain

EXAM:
CT NECK WITH CONTRAST
TECHNIQUE: Multidetector CT imaging of the neck was performed using the
standard protocol following the bolus administration of intravenous
contrast.
CONTRAST:  120mL OMNIPAQUE IOHEXOL 350 MG/ML SOLN

[Series 2: axial neck (person_name) · axial · 0.49mm/px · z∈[+979,+1059]mm · 3 of 82 slices shown, 4 images]
[im 21/82  soft-tissue]
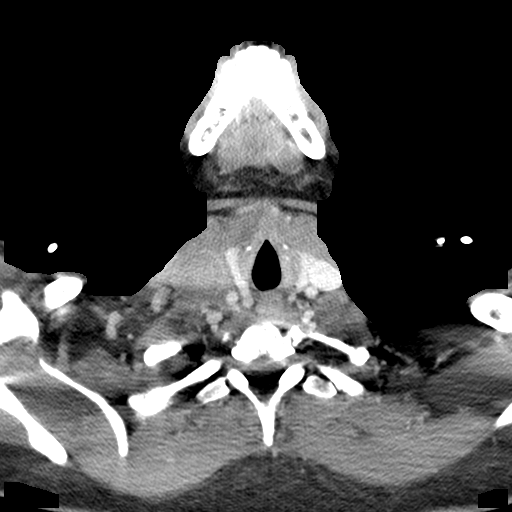
[im 21/82  bone]
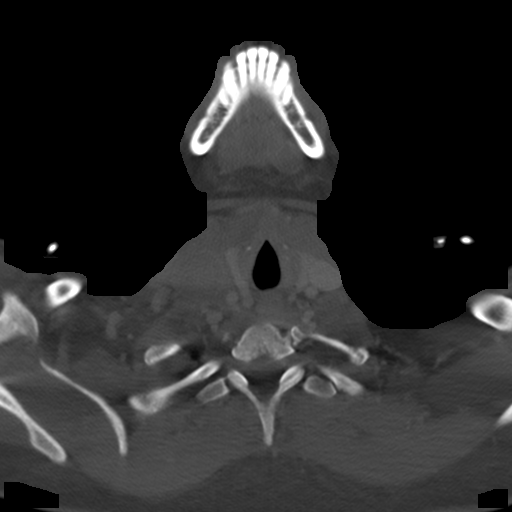
[im 41/82  bone]
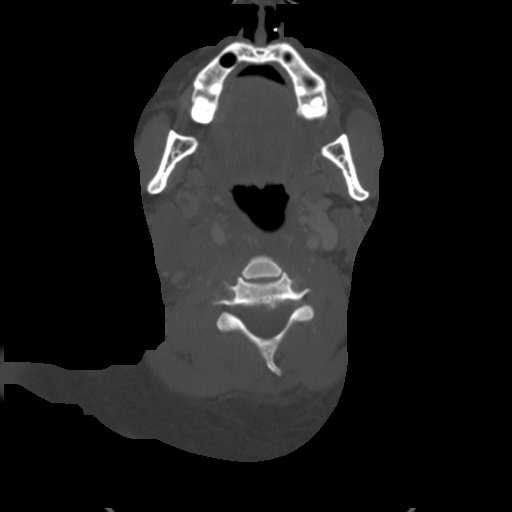
[im 61/82  bone]
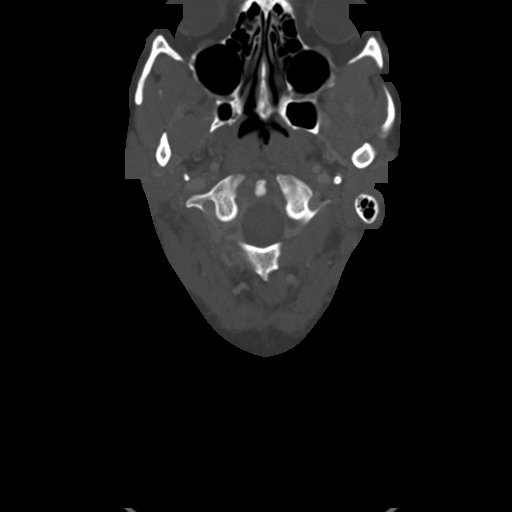

[Series 3: ax bone · axial · 0.49mm/px · z∈[+993,+1047]mm · 2 of 82 slices shown]
[im 28/82  bone]
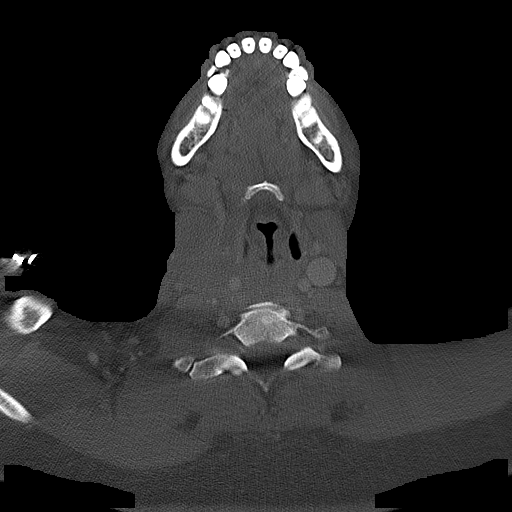
[im 55/82  bone]
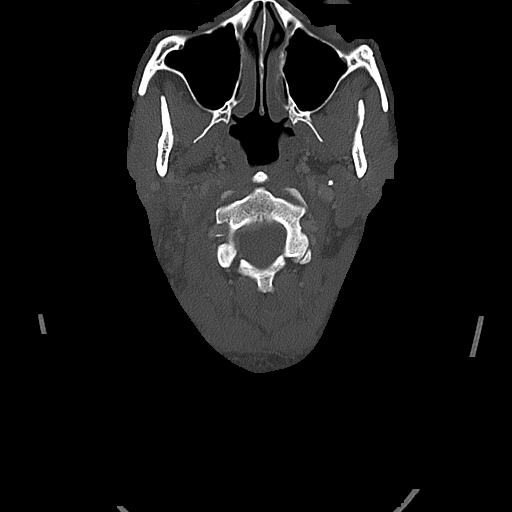

[Series 4: cor neck · coronal · 0.40mm/px · 3 of 114 slices shown]
[im 39/114  bone]
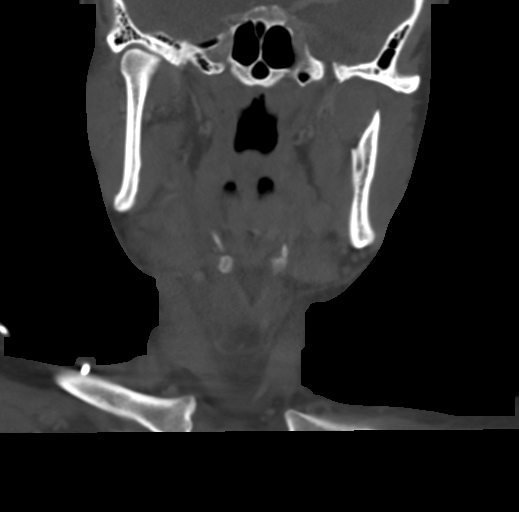
[im 51/114  bone]
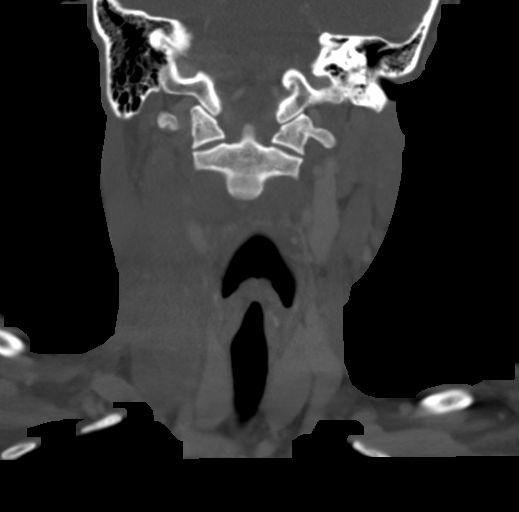
[im 63/114  bone]
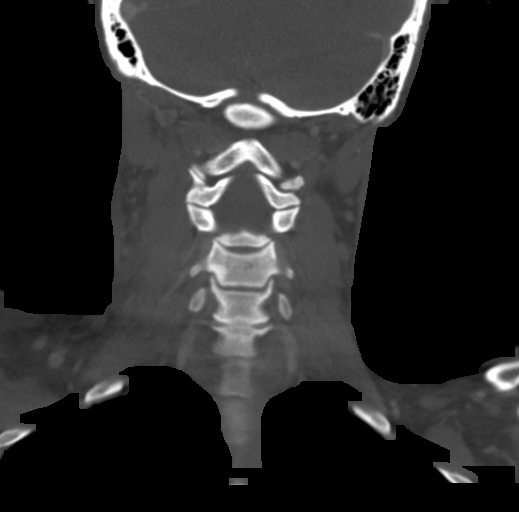

[Series 5: sag neck · sagittal · 0.40mm/px · 5 of 86 slices shown, 6 images]
[im 29/86  bone]
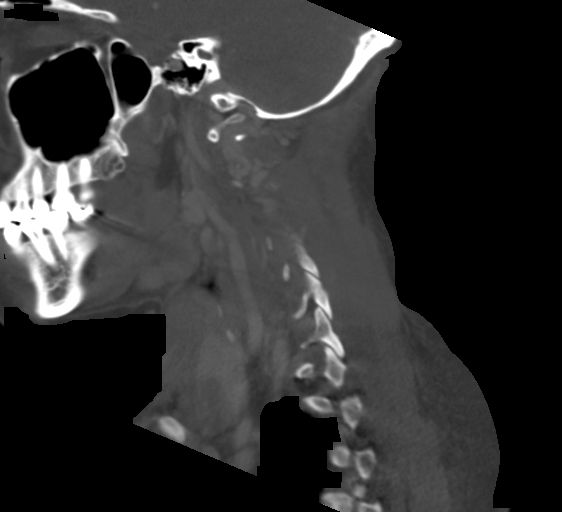
[im 36/86  bone]
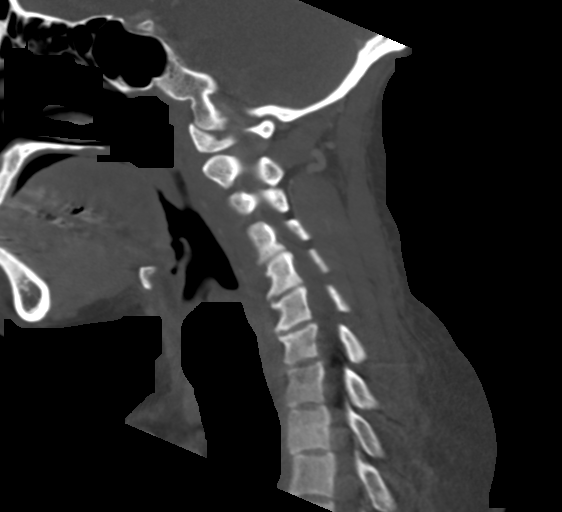
[im 43/86  soft-tissue]
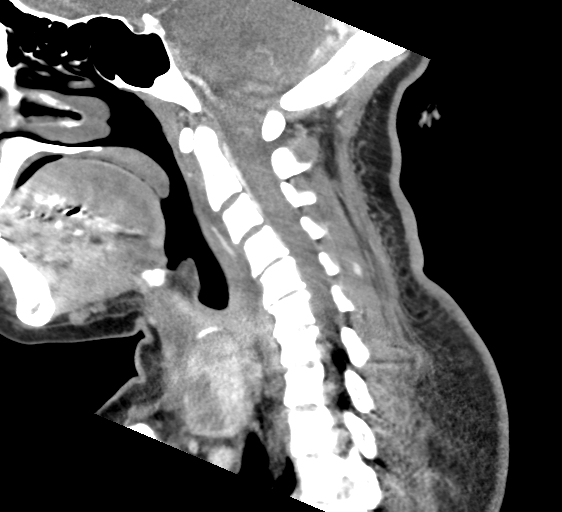
[im 43/86  bone]
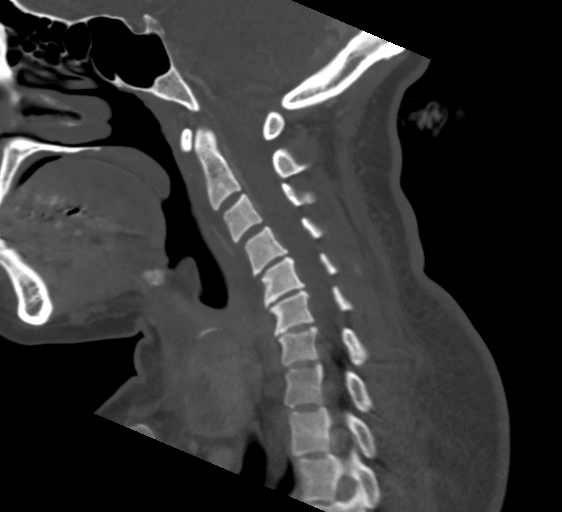
[im 50/86  bone]
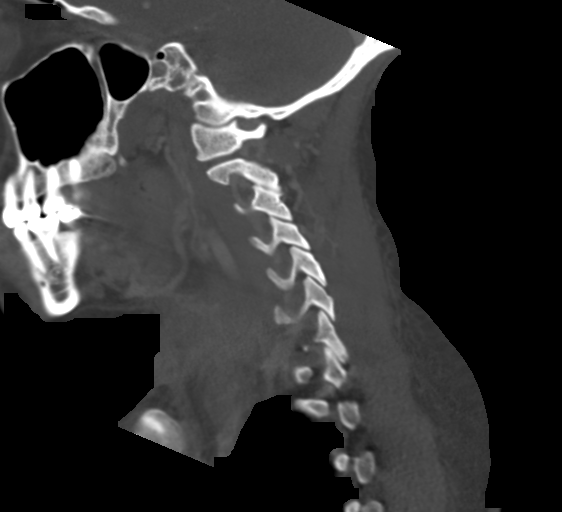
[im 57/86  bone]
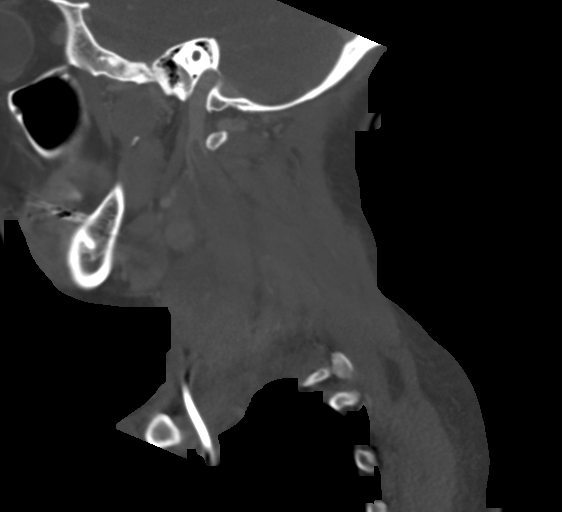

[Series 6: ax oropharynx · axial · 0.31mm/px · z∈[+925,+1009]mm · 3 of 104 slices shown]
[im 26/104  bone]
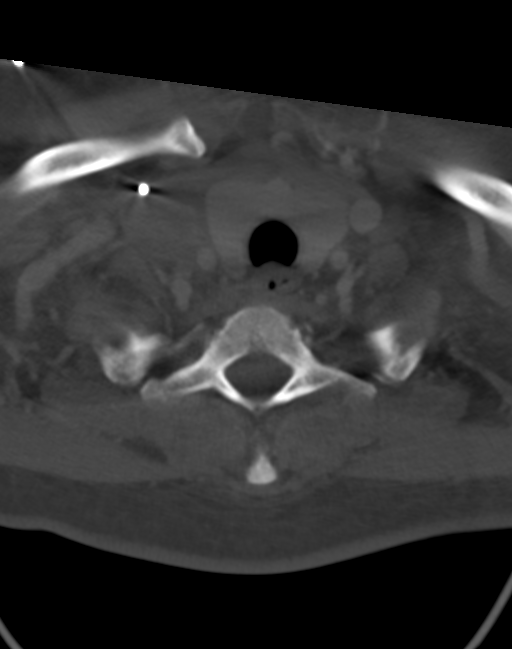
[im 52/104  bone]
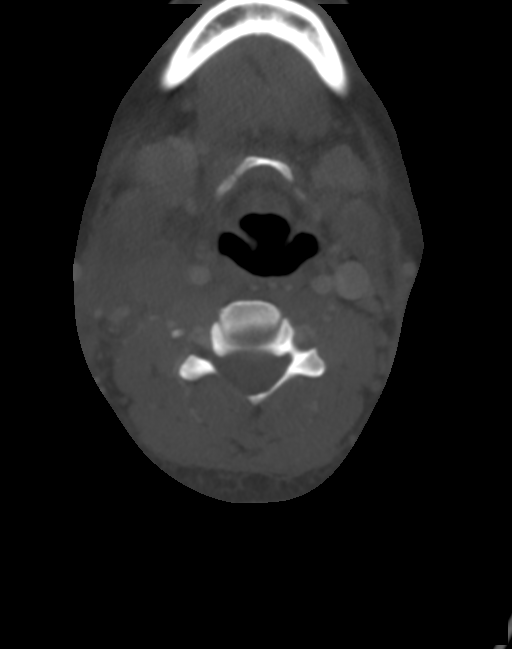
[im 78/104  bone]
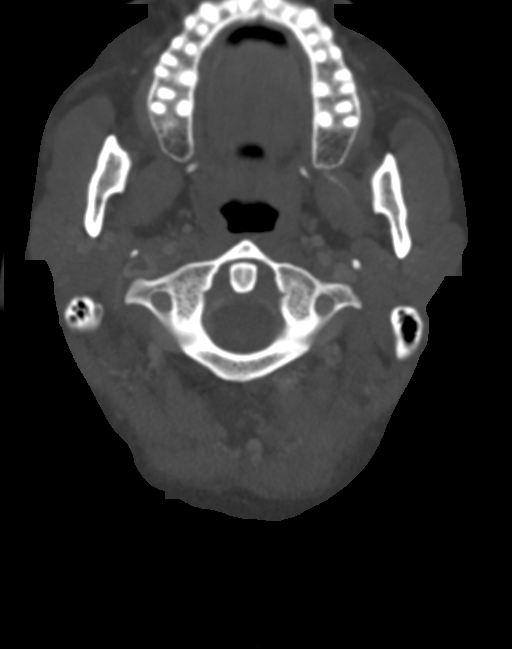

[16 of 35 positions shown; findings below may reference images not displayed]

FINDINGS: Pharynx and larynx: Normal. No mass or swelling.

Salivary glands: No inflammation, mass, or stone.

Thyroid: Normal.

Lymph nodes: None enlarged or abnormal density.

Vascular: The right internal jugular vein is occluded below the C2
level. The lower cervical portion of the internal jugular vein is
markedly distended. The left IJ is normal.

Limited intracranial: Negative.

Visualized orbits: Negative.

Mastoids and visualized paranasal sinuses: Clear.

Skeleton: No acute or aggressive process.

Upper chest: Negative.

Other: Diffuse swelling throughout the right neck.
IMPRESSION: 1. Thrombosis of the right internal jugular vein below the C2 level,
with marked distention of the lower cervical portion of the internal
jugular vein.
2. Diffuse swelling throughout the right neck.

## 2021-02-11 MED ORDER — IOHEXOL 350 MG/ML SOLN
125.0000 mL | Freq: Once | INTRAVENOUS | Status: AC | PRN
  Administered 2021-02-11: 120 mL via INTRAVENOUS

## 2021-02-11 MED ORDER — OXYCODONE HCL 5 MG PO TABS: 2.5000 mg | ORAL_TABLET | Freq: Four times a day (QID) | ORAL | 0 refills | Status: AC | PRN

## 2021-02-11 NOTE — Discharge Instructions (Signed)
For pain control you may take at 1000 mg of Tylenol every 8 hours scheduled.  In addition you can take 0.5 to 1 tablet of Oxycodone every 6 hours as needed for pain not controlled with the scheduled Tylenol. ? ?

## 2021-02-11 NOTE — ED Notes (Signed)
Dr. Leonette Monarch in with pt at this time

## 2021-02-11 NOTE — ED Notes (Signed)
Pt given ginger ale.

## 2021-02-13 ENCOUNTER — Other Ambulatory Visit: Payer: Self-pay

## 2021-02-13 ENCOUNTER — Telehealth: Payer: Self-pay

## 2021-02-13 ENCOUNTER — Ambulatory Visit
Admission: RE | Admit: 2021-02-13 | Discharge: 2021-02-13 | Disposition: A | Discharge status: Home / Self Care | Payer: 59 | Source: Ambulatory Visit | Attending: Radiation Oncology | Admitting: Radiation Oncology

## 2021-02-13 DIAGNOSIS — C20 Malignant neoplasm of rectum: Secondary | ICD-10-CM | POA: Diagnosis not present

## 2021-02-13 DIAGNOSIS — I82C11 Acute embolism and thrombosis of right internal jugular vein: Secondary | ICD-10-CM | POA: Diagnosis not present

## 2021-02-13 DIAGNOSIS — Z510 Encounter for antineoplastic radiation therapy: Secondary | ICD-10-CM | POA: Diagnosis not present

## 2021-02-13 DIAGNOSIS — M542 Cervicalgia: Secondary | ICD-10-CM | POA: Diagnosis not present

## 2021-02-13 NOTE — Telephone Encounter (Signed)
TC from Pt stating she was in so much pain that she went to the ER over the weekend. Pt stated her neck was swollen and it was hard to swallow. Informed Pt that per Dr [name] he does not recommend taking the port out right now and because she has the DVT he wants her to give the Eliquis a chance to work. Pt verbalized understanding.

## 2021-02-14 ENCOUNTER — Other Ambulatory Visit (HOSPITAL_COMMUNITY): Payer: Self-pay

## 2021-02-14 ENCOUNTER — Ambulatory Visit
Admission: RE | Admit: 2021-02-14 | Discharge: 2021-02-14 | Disposition: A | Discharge status: Home / Self Care | Payer: 59 | Source: Ambulatory Visit | Attending: Radiation Oncology | Admitting: Radiation Oncology

## 2021-02-14 ENCOUNTER — Other Ambulatory Visit: Payer: Self-pay | Admitting: *Deleted

## 2021-02-14 DIAGNOSIS — I82C11 Acute embolism and thrombosis of right internal jugular vein: Secondary | ICD-10-CM | POA: Diagnosis not present

## 2021-02-14 DIAGNOSIS — M542 Cervicalgia: Secondary | ICD-10-CM | POA: Diagnosis not present

## 2021-02-14 DIAGNOSIS — Z510 Encounter for antineoplastic radiation therapy: Secondary | ICD-10-CM | POA: Diagnosis not present

## 2021-02-14 DIAGNOSIS — C20 Malignant neoplasm of rectum: Secondary | ICD-10-CM | POA: Diagnosis not present

## 2021-02-14 NOTE — Progress Notes (Signed)
Completed FMLA form faxed to [employer] [phone]. Copy to HIM and original will be provided to patient at next visit.

## 2021-02-15 ENCOUNTER — Ambulatory Visit
Admission: RE | Admit: 2021-02-15 | Discharge: 2021-02-15 | Disposition: A | Discharge status: Home / Self Care | Payer: 59 | Source: Ambulatory Visit | Attending: Radiation Oncology | Admitting: Radiation Oncology

## 2021-02-15 ENCOUNTER — Other Ambulatory Visit: Payer: Self-pay

## 2021-02-15 DIAGNOSIS — C20 Malignant neoplasm of rectum: Secondary | ICD-10-CM | POA: Diagnosis not present

## 2021-02-15 DIAGNOSIS — I82C11 Acute embolism and thrombosis of right internal jugular vein: Secondary | ICD-10-CM | POA: Diagnosis not present

## 2021-02-15 DIAGNOSIS — Z510 Encounter for antineoplastic radiation therapy: Secondary | ICD-10-CM | POA: Diagnosis not present

## 2021-02-15 DIAGNOSIS — M542 Cervicalgia: Secondary | ICD-10-CM | POA: Diagnosis not present

## 2021-02-16 ENCOUNTER — Telehealth: Payer: Self-pay | Admitting: Gynecologic Oncology

## 2021-02-16 ENCOUNTER — Ambulatory Visit
Admission: RE | Admit: 2021-02-16 | Discharge: 2021-02-16 | Disposition: A | Discharge status: Home / Self Care | Payer: 59 | Source: Ambulatory Visit | Attending: Radiation Oncology | Admitting: Radiation Oncology

## 2021-02-16 DIAGNOSIS — C20 Malignant neoplasm of rectum: Secondary | ICD-10-CM | POA: Diagnosis not present

## 2021-02-16 DIAGNOSIS — M542 Cervicalgia: Secondary | ICD-10-CM | POA: Diagnosis not present

## 2021-02-16 DIAGNOSIS — Z510 Encounter for antineoplastic radiation therapy: Secondary | ICD-10-CM | POA: Diagnosis not present

## 2021-02-16 DIAGNOSIS — I82C11 Acute embolism and thrombosis of right internal jugular vein: Secondary | ICD-10-CM | POA: Diagnosis not present

## 2021-02-16 NOTE — Telephone Encounter (Signed)
GYN Oncology has received a new patient referral from Dr. Dema Severin with [facility] for rectal cancer and history of abnormal pap smears. Dr. Orest Dikes office contacted and message left with the referrals coordinator requesting the gynecology records including abnormal paps for consultation.

## 2021-02-17 ENCOUNTER — Ambulatory Visit
Admission: RE | Admit: 2021-02-17 | Discharge: 2021-02-17 | Disposition: A | Discharge status: Home / Self Care | Payer: 59 | Source: Ambulatory Visit | Attending: Radiation Oncology | Admitting: Radiation Oncology

## 2021-02-17 DIAGNOSIS — M542 Cervicalgia: Secondary | ICD-10-CM | POA: Diagnosis not present

## 2021-02-17 DIAGNOSIS — C20 Malignant neoplasm of rectum: Secondary | ICD-10-CM | POA: Diagnosis not present

## 2021-02-17 DIAGNOSIS — I82C11 Acute embolism and thrombosis of right internal jugular vein: Secondary | ICD-10-CM | POA: Diagnosis not present

## 2021-02-17 DIAGNOSIS — Z510 Encounter for antineoplastic radiation therapy: Secondary | ICD-10-CM | POA: Diagnosis not present

## 2021-02-20 ENCOUNTER — Ambulatory Visit
Admission: RE | Admit: 2021-02-20 | Discharge: 2021-02-20 | Disposition: A | Discharge status: Home / Self Care | Payer: 59 | Source: Ambulatory Visit | Attending: Radiation Oncology | Admitting: Radiation Oncology

## 2021-02-20 ENCOUNTER — Other Ambulatory Visit: Payer: Self-pay

## 2021-02-20 DIAGNOSIS — M542 Cervicalgia: Secondary | ICD-10-CM | POA: Diagnosis not present

## 2021-02-20 DIAGNOSIS — C20 Malignant neoplasm of rectum: Secondary | ICD-10-CM | POA: Diagnosis not present

## 2021-02-20 DIAGNOSIS — I82C11 Acute embolism and thrombosis of right internal jugular vein: Secondary | ICD-10-CM | POA: Diagnosis not present

## 2021-02-20 DIAGNOSIS — Z510 Encounter for antineoplastic radiation therapy: Secondary | ICD-10-CM | POA: Diagnosis not present

## 2021-02-21 ENCOUNTER — Telehealth (HOSPITAL_COMMUNITY): Payer: Self-pay | Admitting: Emergency Medicine

## 2021-02-21 ENCOUNTER — Ambulatory Visit
Admission: RE | Admit: 2021-02-21 | Discharge: 2021-02-21 | Disposition: A | Discharge status: Home / Self Care | Payer: 59 | Source: Ambulatory Visit | Attending: Radiation Oncology | Admitting: Radiation Oncology

## 2021-02-21 DIAGNOSIS — I82C11 Acute embolism and thrombosis of right internal jugular vein: Secondary | ICD-10-CM | POA: Diagnosis not present

## 2021-02-21 DIAGNOSIS — Z510 Encounter for antineoplastic radiation therapy: Secondary | ICD-10-CM | POA: Diagnosis not present

## 2021-02-21 DIAGNOSIS — M542 Cervicalgia: Secondary | ICD-10-CM | POA: Diagnosis not present

## 2021-02-21 DIAGNOSIS — C20 Malignant neoplasm of rectum: Secondary | ICD-10-CM | POA: Diagnosis not present

## 2021-02-21 NOTE — Telephone Encounter (Signed)
Pts [employer] disability paperwork completed and faxed (fax conformation [phone]) and has been sent to HIM.

## 2021-02-22 ENCOUNTER — Other Ambulatory Visit: Payer: Self-pay

## 2021-02-22 ENCOUNTER — Ambulatory Visit
Admission: RE | Admit: 2021-02-22 | Discharge: 2021-02-22 | Disposition: A | Discharge status: Home / Self Care | Payer: 59 | Source: Ambulatory Visit | Attending: Radiation Oncology | Admitting: Radiation Oncology

## 2021-02-22 DIAGNOSIS — Z510 Encounter for antineoplastic radiation therapy: Secondary | ICD-10-CM | POA: Diagnosis not present

## 2021-02-22 DIAGNOSIS — I82C11 Acute embolism and thrombosis of right internal jugular vein: Secondary | ICD-10-CM | POA: Diagnosis not present

## 2021-02-22 DIAGNOSIS — C20 Malignant neoplasm of rectum: Secondary | ICD-10-CM | POA: Diagnosis not present

## 2021-02-22 DIAGNOSIS — M542 Cervicalgia: Secondary | ICD-10-CM | POA: Diagnosis not present

## 2021-02-23 ENCOUNTER — Inpatient Hospital Stay: Payer: 59 | Admitting: Oncology

## 2021-02-23 ENCOUNTER — Inpatient Hospital Stay: Payer: 59

## 2021-02-23 ENCOUNTER — Other Ambulatory Visit: Payer: Self-pay

## 2021-02-23 ENCOUNTER — Ambulatory Visit
Admission: RE | Admit: 2021-02-23 | Discharge: 2021-02-23 | Disposition: A | Discharge status: Home / Self Care | Payer: 59 | Source: Ambulatory Visit | Attending: Radiation Oncology | Admitting: Radiation Oncology

## 2021-02-23 ENCOUNTER — Telehealth: Payer: Self-pay

## 2021-02-23 VITALS — BP 124/87 | HR 94 | Temp 98.2°F | Resp 18 | Ht 65.0 in | Wt 161.0 lb

## 2021-02-23 DIAGNOSIS — Z510 Encounter for antineoplastic radiation therapy: Secondary | ICD-10-CM | POA: Diagnosis not present

## 2021-02-23 DIAGNOSIS — C20 Malignant neoplasm of rectum: Secondary | ICD-10-CM | POA: Diagnosis not present

## 2021-02-23 DIAGNOSIS — M542 Cervicalgia: Secondary | ICD-10-CM | POA: Diagnosis not present

## 2021-02-23 DIAGNOSIS — I82C11 Acute embolism and thrombosis of right internal jugular vein: Secondary | ICD-10-CM | POA: Diagnosis not present

## 2021-02-23 LAB — CBC WITH DIFFERENTIAL (CANCER CENTER ONLY)
Abs Immature Granulocytes: 0 10*3/uL (ref 0.00–0.07)
Basophils Absolute: 0 10*3/uL (ref 0.0–0.1)
Basophils Relative: 0 %
Eosinophils Absolute: 0.1 10*3/uL (ref 0.0–0.5)
Eosinophils Relative: 5 %
HCT: 33.6 % — ABNORMAL LOW (ref 36.0–46.0)
Hemoglobin: 10.8 g/dL — ABNORMAL LOW (ref 12.0–15.0)
Immature Granulocytes: 0 %
Lymphocytes Relative: 14 %
Lymphs Abs: 0.4 10*3/uL — ABNORMAL LOW (ref 0.7–4.0)
MCH: 28.2 pg (ref 26.0–34.0)
MCHC: 32.1 g/dL (ref 30.0–36.0)
MCV: 87.7 fL (ref 80.0–100.0)
Monocytes Absolute: 0.5 10*3/uL (ref 0.1–1.0)
Monocytes Relative: 17 %
Neutro Abs: 1.8 10*3/uL (ref 1.7–7.7)
Neutrophils Relative %: 64 %
Platelet Count: 220 10*3/uL (ref 150–400)
RBC: 3.83 MIL/uL — ABNORMAL LOW (ref 3.87–5.11)
RDW: 15.8 % — ABNORMAL HIGH (ref 11.5–15.5)
WBC Count: 2.8 10*3/uL — ABNORMAL LOW (ref 4.0–10.5)
nRBC: 0 % (ref 0.0–0.2)

## 2021-02-23 LAB — CMP (CANCER CENTER ONLY)
ALT: 42 U/L (ref 0–44)
AST: 25 U/L (ref 15–41)
Albumin: 4.2 g/dL (ref 3.5–5.0)
Alkaline Phosphatase: 66 U/L (ref 38–126)
Anion gap: 9 (ref 5–15)
BUN: 6 mg/dL (ref 6–20)
CO2: 25 mmol/L (ref 22–32)
Calcium: 9 mg/dL (ref 8.9–10.3)
Chloride: 104 mmol/L (ref 98–111)
Creatinine: 0.58 mg/dL (ref 0.44–1.00)
GFR, Estimated: >60 mL/min (ref >60)
Glucose, Bld: 83 mg/dL (ref 70–99)
Potassium: 3.3 mmol/L — ABNORMAL LOW (ref 3.5–5.1)
Sodium: 138 mmol/L (ref 135–145)
Total Bilirubin: 0.6 mg/dL (ref 0.3–1.2)
Total Protein: 7.3 g/dL (ref 6.5–8.1)

## 2021-02-23 LAB — MAGNESIUM: Magnesium: 1.8 mg/dL (ref 1.7–2.4)

## 2021-02-23 NOTE — Progress Notes (Signed)
[facility] OFFICE PROGRESS NOTE   Diagnosis: Rectal cancer  INTERVAL HISTORY:   Sharon Bennett continues radiation and Xeloda.  No mouth sores or hand/foot pain.  She has noted darkening of the toenails.  She has frequent bowel movements, up to 8/day.  The bowel movements have variable volume and consistency.  She has rectal urgency.  She urinates frequently at night.  Bleeding has improved since beginning radiation. She was diagnosed with an acute right upper extremity DVT on 02/09/2021.  She began apixaban anticoagulation.  She was seen in emergency room on 02/10/2021 with persistent right neck pain.  A CT of the neck revealed thrombosis of the right IJ with distention of the lower cervical portion of the internal jugular vein.  Swelling was noted in the right neck.  No evidence of pulmonary embolism.  She was treated with Dilaudid.  She reports this caused nausea.  The pain has resolved.  Objective:  Vital signs in last 24 hours:  Blood pressure 124/87, pulse 94, temperature 98.2 F (36.8 C), temperature source Oral, resp. rate 18, height 5' 5" (1.651 m), weight 161 lb (73 kg).    HEENT: No neck swelling, tenderness, or erythema.  No thrush or ulcers Resp: Lungs clear with decreased breath sounds at the lower posterior chest bilaterally, no respiratory distress Cardio: Regular rate and rhythm GI: Nontender, no hepatosplenomegaly Vascular: No leg edema  Skin: Dryness and mild hyperpigmentation of the hands and feet, hyperpigmentation of the nailbeds  Portacath/PICC-without erythema  Lab Results:  Lab Results  Component Value Date   WBC 2.8 (L) 02/23/2021   HGB 10.8 (L) 02/23/2021   HCT 33.6 (L) 02/23/2021   MCV 87.7 02/23/2021   PLT 220 02/23/2021   NEUTROABS 1.8 02/23/2021    CMP  Lab Results  Component Value Date   NA 138 02/23/2021   K 3.3 (L) 02/23/2021   CL 104 02/23/2021   CO2 25 02/23/2021   GLUCOSE 83 02/23/2021   BUN 6 02/23/2021   CREATININE 0.58  02/23/2021   CALCIUM 9.0 02/23/2021   PROT 7.3 02/23/2021   ALBUMIN 4.2 02/23/2021   AST 25 02/23/2021   ALT 42 02/23/2021   ALKPHOS 66 02/23/2021   BILITOT 0.6 02/23/2021   GFRNONAA >60 02/23/2021    Lab Results  Component Value Date   CEA 1.48 09/15/2020    Medications: I have reviewed the patient's current medications.   Assessment/Plan:  Rectal cancer  CT abdomen/pelvis 08/21/2020-large rectal mass measuring 4.4 x 2.6 x 5.5 cm, haziness in the mesorectal fat, obscuration of the fat plane between the mass and the adjacent posterior aspect of the uterus, numerous prominent mesorectal lymph nodes measuring up to 5 mm Colonoscopy 08/23/2020-fungating infiltrative nonobstructing large mass found in the rectum.  Mass was noncircumferential, measured 5 cm in length, 3 cm in diameter and was 7 cm from the anal verge.  No bleeding was present.   Pathology showed invasive colonic adenocarcinoma, moderately differentiated.  No evidence of mismatch repair deficiency. Chest CT 09/01/2020-no evidence of metastatic disease MRI pelvis 09/02/2020-tumor at 10.4 cm from anal verge, 5.9 cm from the internal anal sphincter, T3, approximately 15 greater than 5 mm lymph nodes, N2b, multiple uterine fibroids Cycle 1 FOLFOX 09/15/2020 Cycle 2 FOLFOX 09/29/2020 Cycle 3 FOLFOX 10/13/2020 Cycle 4 FOLFOX 10/27/2020 Cycle 5 FOLFOX 11/10/2020 Cycle 6 FOLFOX 11/24/2020 Cycle 7 FOLFOX 12/08/2020 Cycle 8 FOLFOX 01/05/2021, Ziextenzo Radiation/Xeloda 01/23/2021 2.   rectal bleeding, change in bowel habits secondary to #1  3.   Uterine fibroids noted on MRI pelvis 09/02/2020 4.   Port-A-Cath placement 09/09/2020, Interventional Radiology 5.  Admission 12/14/2020 with febrile neutropenia, E. coli bacteremia-completed an outpatient course of Augmentin 6.  Right IJ DVT 02/09/2021-apixaban       Disposition: Sharon Bennett is completing neoadjuvant capecitabine and radiation.  She appears to be tolerating the treatment well.  The  diarrhea and urinary frequency are likely in part related to an effect of radiation.  I encouraged her to increase fluid intake and use the potassium supplement.  She will increase the potassium to twice daily.  She will use Imodium as needed.  We will check a magnesium level today.  Sharon Bennett will complete radiation 03/03/2021.  She will return for an office and lab visit on 03/09/2021.  The plan is to complete 3 months of anticoagulation therapy for the right IJ DVT.  [name], MD  02/23/2021  9:40 AM

## 2021-02-23 NOTE — Telephone Encounter (Signed)
Received call from Ms. Gadsden this morning. Patient returning call regarding her referral to GYN oncology. Appointment scheduled with Dr. Berline Lopes on 03/20/21 at 11:15 am. Patient agrees to date and time. She has been provided with office address and location. She is also aware of our mask and visitor policy. Patient verbalized understanding and will call with any questions.

## 2021-02-24 ENCOUNTER — Ambulatory Visit
Admission: RE | Admit: 2021-02-24 | Discharge: 2021-02-24 | Disposition: A | Discharge status: Home / Self Care | Payer: 59 | Source: Ambulatory Visit | Attending: Radiation Oncology | Admitting: Radiation Oncology

## 2021-02-24 DIAGNOSIS — I82C11 Acute embolism and thrombosis of right internal jugular vein: Secondary | ICD-10-CM | POA: Diagnosis not present

## 2021-02-24 DIAGNOSIS — C20 Malignant neoplasm of rectum: Secondary | ICD-10-CM | POA: Diagnosis not present

## 2021-02-24 DIAGNOSIS — Z510 Encounter for antineoplastic radiation therapy: Secondary | ICD-10-CM | POA: Diagnosis not present

## 2021-02-24 DIAGNOSIS — M542 Cervicalgia: Secondary | ICD-10-CM | POA: Diagnosis not present

## 2021-02-27 ENCOUNTER — Telehealth (HOSPITAL_COMMUNITY): Payer: Self-pay | Admitting: Dietician

## 2021-02-27 ENCOUNTER — Other Ambulatory Visit (HOSPITAL_COMMUNITY): Payer: Self-pay

## 2021-02-27 ENCOUNTER — Encounter (HOSPITAL_COMMUNITY): Payer: 59 | Admitting: Dietician

## 2021-02-27 ENCOUNTER — Ambulatory Visit
Admission: RE | Admit: 2021-02-27 | Discharge: 2021-02-27 | Disposition: A | Discharge status: Home / Self Care | Payer: 59 | Source: Ambulatory Visit | Attending: Radiation Oncology | Admitting: Radiation Oncology

## 2021-02-27 ENCOUNTER — Other Ambulatory Visit: Payer: Self-pay

## 2021-02-27 DIAGNOSIS — I82C11 Acute embolism and thrombosis of right internal jugular vein: Secondary | ICD-10-CM | POA: Diagnosis not present

## 2021-02-27 DIAGNOSIS — C20 Malignant neoplasm of rectum: Secondary | ICD-10-CM | POA: Diagnosis not present

## 2021-02-27 DIAGNOSIS — M542 Cervicalgia: Secondary | ICD-10-CM | POA: Diagnosis not present

## 2021-02-27 DIAGNOSIS — Z510 Encounter for antineoplastic radiation therapy: Secondary | ICD-10-CM | POA: Diagnosis not present

## 2021-02-27 NOTE — Telephone Encounter (Signed)
Nutrition Assessment   Reason for Assessment: MST (wt loss, poor appetite)   ASSESSMENT: 41 year female with rectal cancer. Patient completed 8 cycles of FOLFOX on 01/05/21. She is currently receiving neoadjuvant xeloda and radiation therapy. Patient is followed by [name] and Dr. Lisbeth Renshaw -Noted pt diagnosed with RUE DVT on 1/5, seen in ED on 1/6 with neck pain and found to have thrombosis of right IJ.   Spoke with patient via telephone. Introduced self and services available at [facility]. She is appreciative of call and agreeable to visit. Patient reports poor appetite. She is having 6-8 episodes of diarrhea daily since start of radiation. Patient reports trying to eat small amounts frequently throughout the day. She is drinking protein shake once daily (unsure of calories, ~16 grams protein).    Medications: Tramadol, Klor-con, Eliquis, Magic mouthwash, Diflucan   Labs: 1/19 K 3.3   Anthropometrics:  Height: 5'5" Weight: 161 lb (1/19) UBW: 165 lb  BMI: 26.79    NUTRITION DIAGNOSIS: Altered GI function related to cancer treatment side effects as evidenced by reported daily episodes of diarrhea and hypokalemia per labs   INTERVENTION:  Discussed strategies for diarrhea - will mail handout  Educated on foods with pectin (bananas, applesauce, potatoes) as well as rice to aid with firming of stool Continue eating small frequent meals and snacks with adequate calories and protein - will mail handout with snack ideas Continue drinking protein shake, recommend 2-3 daily as tolerated  Take imodium as needed per MD Contact information provided     MONITORING, EVALUATION, GOAL: weight trends, intake    Next Visit: No follow-up scheduled at this time. Patient encouraged to contact with nutrition questions/concerns

## 2021-02-28 ENCOUNTER — Ambulatory Visit
Admission: RE | Admit: 2021-02-28 | Discharge: 2021-02-28 | Disposition: A | Discharge status: Home / Self Care | Payer: 59 | Source: Ambulatory Visit | Attending: Radiation Oncology | Admitting: Radiation Oncology

## 2021-02-28 DIAGNOSIS — C20 Malignant neoplasm of rectum: Secondary | ICD-10-CM | POA: Diagnosis not present

## 2021-02-28 DIAGNOSIS — Z510 Encounter for antineoplastic radiation therapy: Secondary | ICD-10-CM | POA: Diagnosis not present

## 2021-02-28 DIAGNOSIS — M542 Cervicalgia: Secondary | ICD-10-CM | POA: Diagnosis not present

## 2021-02-28 DIAGNOSIS — I82C11 Acute embolism and thrombosis of right internal jugular vein: Secondary | ICD-10-CM | POA: Diagnosis not present

## 2021-02-28 NOTE — Progress Notes (Signed)
°                                                                                                                                                          °  Patient Name: Sharon Bennett MRN: [id number] DOB: 01-Mar-1980 Referring Physician: [name] (Profile Not Attached) Date of Service: 03/03/2021 [facility], [place]                                                        End Of Treatment Note  Diagnoses: C20-Malignant neoplasm of rectum  Cancer Staging: Stage IIIC, [id number], adenocarcinoma of the rectum.   Intent: Curative  Radiation Treatment Dates: 01/23/2021 through 03/03/2021 Site Technique Total Dose (Gy) Dose per Fx (Gy) Completed Fx Beam Energies  Rectum: Rectum 3D 45/45 1.8 25/25 10X, 15X  Rectum: Rectum_Bst 3D 5.4/5.4 1.8 3/3 10X, 15X   Narrative: The patient tolerated radiation therapy relatively well. She developed fatigue and developed discomfort in the rectum and also in the sacral region with skin changes in the treatment field. She noted loose bowel movements and urinary frequency.    Plan: The patient will receive a call in about one month from the radiation oncology department. She will continue follow up with [name] and Dr. Dema Severin as well.   ________________________________________________    Carola Rhine, [facility]

## 2021-03-01 ENCOUNTER — Other Ambulatory Visit: Payer: Self-pay

## 2021-03-01 ENCOUNTER — Ambulatory Visit
Admission: RE | Admit: 2021-03-01 | Discharge: 2021-03-01 | Disposition: A | Discharge status: Home / Self Care | Payer: 59 | Source: Ambulatory Visit | Attending: Radiation Oncology | Admitting: Radiation Oncology

## 2021-03-01 DIAGNOSIS — C20 Malignant neoplasm of rectum: Secondary | ICD-10-CM | POA: Diagnosis not present

## 2021-03-01 DIAGNOSIS — I82C11 Acute embolism and thrombosis of right internal jugular vein: Secondary | ICD-10-CM | POA: Diagnosis not present

## 2021-03-01 DIAGNOSIS — M542 Cervicalgia: Secondary | ICD-10-CM | POA: Diagnosis not present

## 2021-03-01 DIAGNOSIS — Z510 Encounter for antineoplastic radiation therapy: Secondary | ICD-10-CM | POA: Diagnosis not present

## 2021-03-02 ENCOUNTER — Ambulatory Visit
Admission: RE | Admit: 2021-03-02 | Discharge: 2021-03-02 | Disposition: A | Discharge status: Home / Self Care | Payer: 59 | Source: Ambulatory Visit | Attending: Radiation Oncology | Admitting: Radiation Oncology

## 2021-03-02 DIAGNOSIS — Z510 Encounter for antineoplastic radiation therapy: Secondary | ICD-10-CM | POA: Diagnosis not present

## 2021-03-02 DIAGNOSIS — I82C11 Acute embolism and thrombosis of right internal jugular vein: Secondary | ICD-10-CM | POA: Diagnosis not present

## 2021-03-02 DIAGNOSIS — M542 Cervicalgia: Secondary | ICD-10-CM | POA: Diagnosis not present

## 2021-03-02 DIAGNOSIS — C20 Malignant neoplasm of rectum: Secondary | ICD-10-CM | POA: Diagnosis not present

## 2021-03-03 ENCOUNTER — Encounter: Payer: Self-pay | Admitting: Oncology

## 2021-03-03 ENCOUNTER — Encounter: Payer: Self-pay | Admitting: Radiation Oncology

## 2021-03-03 ENCOUNTER — Ambulatory Visit
Admission: RE | Admit: 2021-03-03 | Discharge: 2021-03-03 | Disposition: A | Discharge status: Home / Self Care | Payer: 59 | Source: Ambulatory Visit | Attending: Radiation Oncology | Admitting: Radiation Oncology

## 2021-03-03 DIAGNOSIS — C20 Malignant neoplasm of rectum: Secondary | ICD-10-CM | POA: Diagnosis not present

## 2021-03-03 DIAGNOSIS — M542 Cervicalgia: Secondary | ICD-10-CM | POA: Diagnosis not present

## 2021-03-03 DIAGNOSIS — I82C11 Acute embolism and thrombosis of right internal jugular vein: Secondary | ICD-10-CM | POA: Diagnosis not present

## 2021-03-03 DIAGNOSIS — Z510 Encounter for antineoplastic radiation therapy: Secondary | ICD-10-CM | POA: Diagnosis not present

## 2021-03-06 ENCOUNTER — Other Ambulatory Visit (HOSPITAL_COMMUNITY): Payer: Self-pay

## 2021-03-06 MED ORDER — SLYND 4 MG PO TABS
1.0000 | ORAL_TABLET | Freq: Every day | ORAL | 3 refills | Status: DC
  Filled 2021-03-08: qty 84, 84d supply, fill #0

## 2021-03-07 ENCOUNTER — Other Ambulatory Visit (HOSPITAL_COMMUNITY): Payer: Self-pay

## 2021-03-07 MED ORDER — APIXABAN 5 MG PO TABS
5.0000 mg | ORAL_TABLET | Freq: Two times a day (BID) | ORAL | 3 refills | Status: DC
  Filled 2021-03-07: qty 60, 30d supply, fill #0

## 2021-03-08 ENCOUNTER — Other Ambulatory Visit (HOSPITAL_COMMUNITY): Payer: Self-pay

## 2021-03-09 ENCOUNTER — Other Ambulatory Visit: Payer: Self-pay

## 2021-03-09 ENCOUNTER — Other Ambulatory Visit (HOSPITAL_COMMUNITY): Payer: Self-pay

## 2021-03-09 ENCOUNTER — Inpatient Hospital Stay: Payer: 59

## 2021-03-09 ENCOUNTER — Inpatient Hospital Stay: Payer: 59 | Attending: Oncology | Admitting: Nurse Practitioner

## 2021-03-09 ENCOUNTER — Encounter: Payer: Self-pay | Admitting: Nurse Practitioner

## 2021-03-09 VITALS — BP 129/95 | HR 102 | Temp 97.7°F | Resp 18 | Ht 65.0 in | Wt 160.0 lb

## 2021-03-09 DIAGNOSIS — R87619 Unspecified abnormal cytological findings in specimens from cervix uteri: Secondary | ICD-10-CM | POA: Diagnosis not present

## 2021-03-09 DIAGNOSIS — R202 Paresthesia of skin: Secondary | ICD-10-CM | POA: Diagnosis not present

## 2021-03-09 DIAGNOSIS — Z95828 Presence of other vascular implants and grafts: Secondary | ICD-10-CM

## 2021-03-09 DIAGNOSIS — Z793 Long term (current) use of hormonal contraceptives: Secondary | ICD-10-CM | POA: Diagnosis not present

## 2021-03-09 DIAGNOSIS — Z87410 Personal history of cervical dysplasia: Secondary | ICD-10-CM | POA: Diagnosis not present

## 2021-03-09 DIAGNOSIS — Z7901 Long term (current) use of anticoagulants: Secondary | ICD-10-CM | POA: Diagnosis not present

## 2021-03-09 DIAGNOSIS — R300 Dysuria: Secondary | ICD-10-CM | POA: Diagnosis not present

## 2021-03-09 DIAGNOSIS — C20 Malignant neoplasm of rectum: Secondary | ICD-10-CM

## 2021-03-09 DIAGNOSIS — N898 Other specified noninflammatory disorders of vagina: Secondary | ICD-10-CM | POA: Diagnosis not present

## 2021-03-09 DIAGNOSIS — N3000 Acute cystitis without hematuria: Secondary | ICD-10-CM | POA: Diagnosis not present

## 2021-03-09 DIAGNOSIS — R200 Anesthesia of skin: Secondary | ICD-10-CM | POA: Insufficient documentation

## 2021-03-09 DIAGNOSIS — I82C11 Acute embolism and thrombosis of right internal jugular vein: Secondary | ICD-10-CM | POA: Diagnosis not present

## 2021-03-09 DIAGNOSIS — A630 Anogenital (venereal) warts: Secondary | ICD-10-CM | POA: Insufficient documentation

## 2021-03-09 LAB — CBC WITH DIFFERENTIAL (CANCER CENTER ONLY)
Abs Immature Granulocytes: 0.01 10*3/uL (ref 0.00–0.07)
Basophils Absolute: 0 10*3/uL (ref 0.0–0.1)
Basophils Relative: 1 %
Eosinophils Absolute: 0.1 10*3/uL (ref 0.0–0.5)
Eosinophils Relative: 3 %
HCT: 36 % (ref 36.0–46.0)
Hemoglobin: 11.5 g/dL — ABNORMAL LOW (ref 12.0–15.0)
Immature Granulocytes: 0 %
Lymphocytes Relative: 11 %
Lymphs Abs: 0.5 10*3/uL — ABNORMAL LOW (ref 0.7–4.0)
MCH: 28.2 pg (ref 26.0–34.0)
MCHC: 31.9 g/dL (ref 30.0–36.0)
MCV: 88.2 fL (ref 80.0–100.0)
Monocytes Absolute: 0.4 10*3/uL (ref 0.1–1.0)
Monocytes Relative: 9 %
Neutro Abs: 3.1 10*3/uL (ref 1.7–7.7)
Neutrophils Relative %: 76 %
Platelet Count: 214 10*3/uL (ref 150–400)
RBC: 4.08 MIL/uL (ref 3.87–5.11)
RDW: 16.2 % — ABNORMAL HIGH (ref 11.5–15.5)
WBC Count: 4.1 10*3/uL (ref 4.0–10.5)
nRBC: 0 % (ref 0.0–0.2)

## 2021-03-09 LAB — CMP (CANCER CENTER ONLY)
ALT: 62 U/L — ABNORMAL HIGH (ref 0–44)
AST: 38 U/L (ref 15–41)
Albumin: 4.3 g/dL (ref 3.5–5.0)
Alkaline Phosphatase: 72 U/L (ref 38–126)
Anion gap: 9 (ref 5–15)
BUN: 7 mg/dL (ref 6–20)
CO2: 25 mmol/L (ref 22–32)
Calcium: 9.1 mg/dL (ref 8.9–10.3)
Chloride: 105 mmol/L (ref 98–111)
Creatinine: 0.61 mg/dL (ref 0.44–1.00)
GFR, Estimated: >60 mL/min (ref >60)
Glucose, Bld: 122 mg/dL — ABNORMAL HIGH (ref 70–99)
Potassium: 3.6 mmol/L (ref 3.5–5.1)
Sodium: 139 mmol/L (ref 135–145)
Total Bilirubin: 0.5 mg/dL (ref 0.3–1.2)
Total Protein: 7.7 g/dL (ref 6.5–8.1)

## 2021-03-09 LAB — URINALYSIS, COMPLETE (UACMP) WITH MICROSCOPIC
Glucose, UA: NEGATIVE mg/dL
Hgb urine dipstick: NEGATIVE
Ketones, ur: NEGATIVE mg/dL
Nitrite: POSITIVE — AB
Protein, ur: 30 mg/dL — AB
Specific Gravity, Urine: 1.024 (ref 1.005–1.030)
pH: 6.5 (ref 5.0–8.0)

## 2021-03-09 LAB — MAGNESIUM: Magnesium: 1.9 mg/dL (ref 1.7–2.4)

## 2021-03-09 MED ORDER — SODIUM CHLORIDE 0.9% FLUSH
10.0000 mL | Freq: Once | INTRAVENOUS | Status: AC
  Administered 2021-03-09: 10 mL via INTRAVENOUS

## 2021-03-09 MED ORDER — SULFAMETHOXAZOLE-TRIMETHOPRIM 800-160 MG PO TABS
1.0000 | ORAL_TABLET | Freq: Two times a day (BID) | ORAL | 0 refills | Status: AC
  Filled 2021-03-09: qty 6, 3d supply, fill #0

## 2021-03-09 MED ORDER — HEPARIN SOD (PORK) LOCK FLUSH 100 UNIT/ML IV SOLN
500.0000 [IU] | Freq: Once | INTRAVENOUS | Status: AC
  Administered 2021-03-09: 500 [IU] via INTRAVENOUS

## 2021-03-09 NOTE — Patient Instructions (Signed)

## 2021-03-09 NOTE — Progress Notes (Signed)
Sharon Bennett   Diagnosis: Rectal cancer  INTERVAL HISTORY:   Sharon Bennett returns as scheduled.  She completed the course of radiation/Xeloda 03/03/2021.  She denies nausea/vomiting.  No mouth sores.  She is having less frequent bowel movements.  She has tingling in the hands and feet, numbness in the feet.  Neck pain is better.  She continues Eliquis.  No bleeding.  Main complaint is significant burning with urination, also burning in the vaginal region.  Objective:  Vital signs in last 24 hours:  Blood pressure (!) 129/95, pulse (!) 102, temperature 97.7 F (36.5 C), temperature source Oral, resp. rate 18, height _0  (1.651 m), weight 160 lb (72.6 kg), SpO2 100 %.    HEENT: No thrush or ulcers. Resp: Lungs clear bilaterally. Cardio: Regular rate and rhythm. GI: Abdomen soft and nontender.  No hepatomegaly. Vascular: No leg edema. Skin: Palms without erythema.  Hyperpigmentation over the labia/perineum.  No ulcerations. Port-A-Cath without erythema.  Lab Results:  Lab Results  Component Value Date   WBC 4.1 03/09/2021   HGB 11.5 (L) 03/09/2021   HCT 36.0 03/09/2021   MCV 88.2 03/09/2021   PLT 214 03/09/2021   NEUTROABS 3.1 03/09/2021    Imaging:  No results found.  Medications: I have reviewed the patient's current medications.  Assessment/Plan: Rectal cancer  CT abdomen/pelvis 08/21/2020-large rectal mass measuring 4.4 x 2.6 x 5.5 cm, haziness in the mesorectal fat, obscuration of the fat plane between the mass and the adjacent posterior aspect of the uterus, numerous prominent mesorectal lymph nodes measuring up to 5 mm Colonoscopy 08/23/2020-fungating infiltrative nonobstructing large mass found in the rectum.  Mass was noncircumferential, measured 5 cm in length, 3 cm in diameter and was 7 cm from the anal verge.  No bleeding was present.   Pathology showed invasive colonic adenocarcinoma, moderately differentiated.  No evidence of  mismatch repair deficiency. Chest CT 09/01/2020-no evidence of metastatic disease MRI pelvis 09/02/2020-tumor at 10.4 cm from anal verge, 5.9 cm from the internal anal sphincter, T3, approximately 15 greater than 5 mm lymph nodes, N2b, multiple uterine fibroids Cycle 1 FOLFOX 09/15/2020 Cycle 2 FOLFOX 09/29/2020 Cycle 3 FOLFOX 10/13/2020 Cycle 4 FOLFOX 10/27/2020 Cycle 5 FOLFOX 11/10/2020 Cycle 6 FOLFOX 11/24/2020 Cycle 7 FOLFOX 12/08/2020 Cycle 8 FOLFOX 01/05/2021, Ziextenzo Radiation/Xeloda 01/23/2021-03/03/2021 2.   rectal bleeding, change in bowel habits secondary to #1 3.   Uterine fibroids noted on MRI pelvis 09/02/2020 4.   Port-A-Cath placement 09/09/2020, Interventional Radiology 5.  Admission 12/14/2020 with febrile neutropenia, E. coli bacteremia-completed an outpatient course of Augmentin 6.  Right IJ DVT 02/09/2021-apixaban    Disposition: Sharon Bennett appears stable.  She has completed the course of neoadjuvant therapy.  She has an appointment with Dr. Dema Severin later this month.  The dysuria may be related to radiation though urinalysis today is consistent with an infection.  She will complete a course of Septra DS.  Urine culture is pending.  She will return for lab, port flush and follow-up in approximately 4 weeks.  We are available to see her sooner if needed.    Sharon Bennett ANP/GNP-BC   03/09/2021  1:55 PM

## 2021-03-10 LAB — URINE CULTURE: Culture: NO GROWTH

## 2021-03-13 DIAGNOSIS — H52203 Unspecified astigmatism, bilateral: Secondary | ICD-10-CM | POA: Diagnosis not present

## 2021-03-13 DIAGNOSIS — H5213 Myopia, bilateral: Secondary | ICD-10-CM | POA: Diagnosis not present

## 2021-03-17 ENCOUNTER — Encounter: Payer: Self-pay | Admitting: Gynecologic Oncology

## 2021-03-20 ENCOUNTER — Emergency Department (HOSPITAL_BASED_OUTPATIENT_CLINIC_OR_DEPARTMENT_OTHER)
Admission: EM | Admit: 2021-03-20 | Discharge: 2021-03-20 | Disposition: A | Discharge status: Home / Self Care | Payer: 59 | Attending: Emergency Medicine | Admitting: Emergency Medicine

## 2021-03-20 ENCOUNTER — Encounter (HOSPITAL_BASED_OUTPATIENT_CLINIC_OR_DEPARTMENT_OTHER): Payer: Self-pay

## 2021-03-20 ENCOUNTER — Telehealth: Payer: Self-pay

## 2021-03-20 ENCOUNTER — Inpatient Hospital Stay (HOSPITAL_BASED_OUTPATIENT_CLINIC_OR_DEPARTMENT_OTHER): Payer: 59 | Admitting: Gynecologic Oncology

## 2021-03-20 ENCOUNTER — Other Ambulatory Visit: Payer: Self-pay

## 2021-03-20 ENCOUNTER — Other Ambulatory Visit (HOSPITAL_COMMUNITY): Payer: Self-pay

## 2021-03-20 ENCOUNTER — Emergency Department (HOSPITAL_BASED_OUTPATIENT_CLINIC_OR_DEPARTMENT_OTHER): Payer: 59

## 2021-03-20 ENCOUNTER — Encounter: Payer: Self-pay | Admitting: Gynecologic Oncology

## 2021-03-20 VITALS — BP 124/78 | HR 98 | Temp 98.7°F | Resp 18 | Ht 65.75 in | Wt 156.0 lb

## 2021-03-20 DIAGNOSIS — R200 Anesthesia of skin: Secondary | ICD-10-CM | POA: Diagnosis not present

## 2021-03-20 DIAGNOSIS — N72 Inflammatory disease of cervix uteri: Secondary | ICD-10-CM | POA: Diagnosis not present

## 2021-03-20 DIAGNOSIS — R202 Paresthesia of skin: Secondary | ICD-10-CM | POA: Diagnosis not present

## 2021-03-20 DIAGNOSIS — Z85038 Personal history of other malignant neoplasm of large intestine: Secondary | ICD-10-CM | POA: Insufficient documentation

## 2021-03-20 DIAGNOSIS — B977 Papillomavirus as the cause of diseases classified elsewhere: Secondary | ICD-10-CM

## 2021-03-20 DIAGNOSIS — R300 Dysuria: Secondary | ICD-10-CM | POA: Diagnosis not present

## 2021-03-20 DIAGNOSIS — Z87410 Personal history of cervical dysplasia: Secondary | ICD-10-CM | POA: Diagnosis not present

## 2021-03-20 DIAGNOSIS — M25511 Pain in right shoulder: Secondary | ICD-10-CM | POA: Insufficient documentation

## 2021-03-20 DIAGNOSIS — A630 Anogenital (venereal) warts: Secondary | ICD-10-CM | POA: Diagnosis not present

## 2021-03-20 DIAGNOSIS — Z7901 Long term (current) use of anticoagulants: Secondary | ICD-10-CM | POA: Diagnosis not present

## 2021-03-20 DIAGNOSIS — R000 Tachycardia, unspecified: Secondary | ICD-10-CM | POA: Diagnosis not present

## 2021-03-20 DIAGNOSIS — C20 Malignant neoplasm of rectum: Secondary | ICD-10-CM

## 2021-03-20 DIAGNOSIS — R87619 Unspecified abnormal cytological findings in specimens from cervix uteri: Secondary | ICD-10-CM

## 2021-03-20 DIAGNOSIS — N898 Other specified noninflammatory disorders of vagina: Secondary | ICD-10-CM | POA: Diagnosis not present

## 2021-03-20 DIAGNOSIS — R079 Chest pain, unspecified: Secondary | ICD-10-CM | POA: Diagnosis not present

## 2021-03-20 DIAGNOSIS — M546 Pain in thoracic spine: Secondary | ICD-10-CM | POA: Insufficient documentation

## 2021-03-20 DIAGNOSIS — I82C11 Acute embolism and thrombosis of right internal jugular vein: Secondary | ICD-10-CM | POA: Diagnosis not present

## 2021-03-20 LAB — CBC WITH DIFFERENTIAL/PLATELET
Abs Immature Granulocytes: 0.01 10*3/uL (ref 0.00–0.07)
Basophils Absolute: 0 10*3/uL (ref 0.0–0.1)
Basophils Relative: 1 %
Eosinophils Absolute: 0.1 10*3/uL (ref 0.0–0.5)
Eosinophils Relative: 2 %
HCT: 37 % (ref 36.0–46.0)
Hemoglobin: 11.7 g/dL — ABNORMAL LOW (ref 12.0–15.0)
Immature Granulocytes: 0 %
Lymphocytes Relative: 20 %
Lymphs Abs: 1 10*3/uL (ref 0.7–4.0)
MCH: 28.1 pg (ref 26.0–34.0)
MCHC: 31.6 g/dL (ref 30.0–36.0)
MCV: 88.7 fL (ref 80.0–100.0)
Monocytes Absolute: 0.4 10*3/uL (ref 0.1–1.0)
Monocytes Relative: 9 %
Neutro Abs: 3.3 10*3/uL (ref 1.7–7.7)
Neutrophils Relative %: 68 %
Platelets: 244 10*3/uL (ref 150–400)
RBC: 4.17 MIL/uL (ref 3.87–5.11)
RDW: 15.8 % — ABNORMAL HIGH (ref 11.5–15.5)
WBC: 4.8 10*3/uL (ref 4.0–10.5)
nRBC: 0 % (ref 0.0–0.2)

## 2021-03-20 LAB — BASIC METABOLIC PANEL
Anion gap: 7 (ref 5–15)
BUN: 11 mg/dL (ref 6–20)
CO2: 28 mmol/L (ref 22–32)
Calcium: 9.3 mg/dL (ref 8.9–10.3)
Chloride: 104 mmol/L (ref 98–111)
Creatinine, Ser: 0.88 mg/dL (ref 0.44–1.00)
GFR, Estimated: >60 mL/min (ref >60)
Glucose, Bld: 85 mg/dL (ref 70–99)
Potassium: 3.5 mmol/L (ref 3.5–5.1)
Sodium: 139 mmol/L (ref 135–145)

## 2021-03-20 LAB — PREGNANCY, URINE: Preg Test, Ur: NEGATIVE

## 2021-03-20 IMAGING — CT CT ANGIO CHEST
2 of 7 series · 17 of 46 positions shown · IV contrast (agent unspecified)
Comparison: CT a of the chest and neck on [DATE]

CLINICAL DATA: Right shoulder and upper back pain. History of colon
carcinoma with indwelling Port-A-Cath and history right jugular
venous thrombosis.

EXAM:
CT ANGIOGRAPHY CHEST WITH CONTRAST
TECHNIQUE: Multidetector CT imaging of the chest was performed using the
standard protocol during bolus administration of intravenous
contrast. Multiplanar CT image reconstructions and MIPs were
obtained to evaluate the vascular anatomy.

[Series 5: pe axial thins · axial · 0.65mm/px · z∈[+69,+301]mm · 14 of 268 slices shown]
[im 18/268  lung]
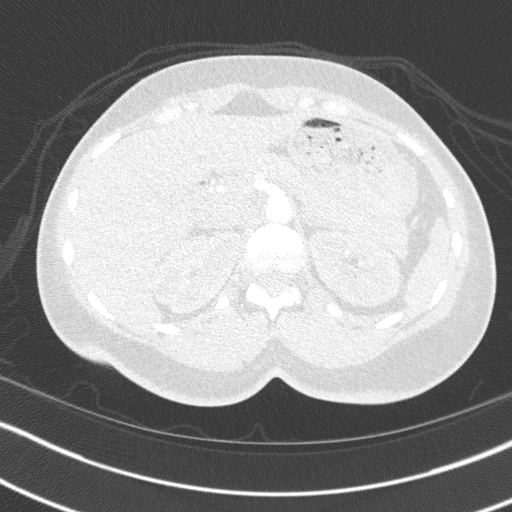
[im 36/268  soft-tissue]
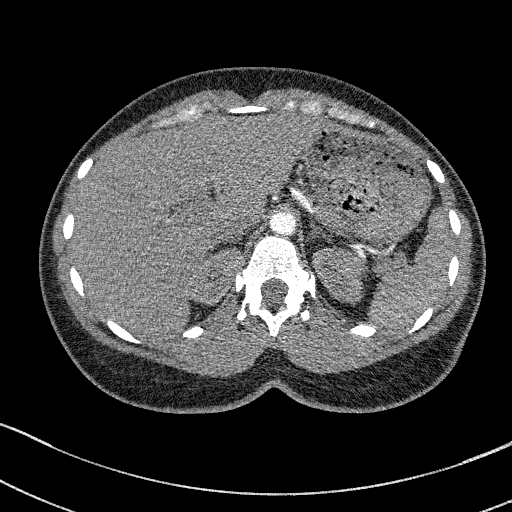
[im 54/268  lung]
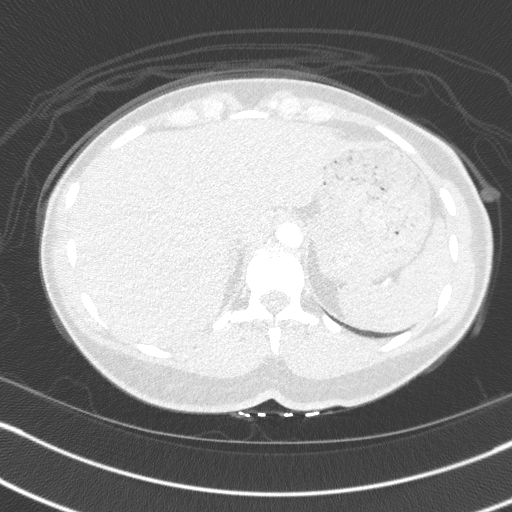
[im 72/268  soft-tissue]
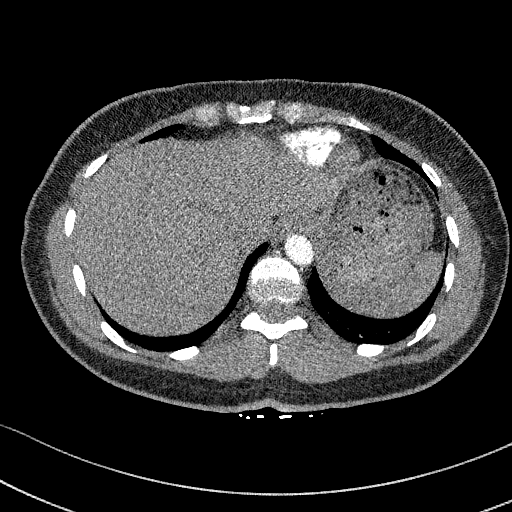
[im 90/268  lung]
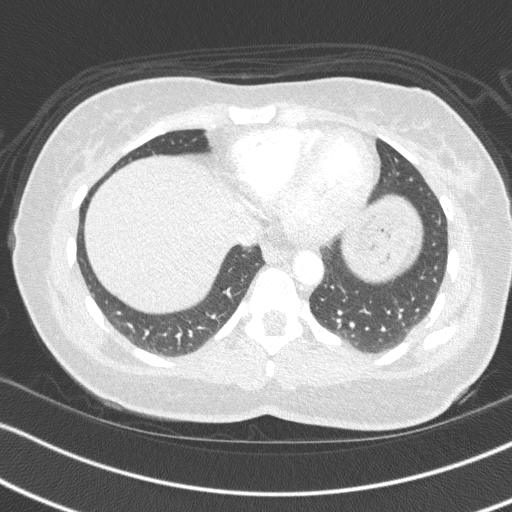
[im 107/268  soft-tissue]
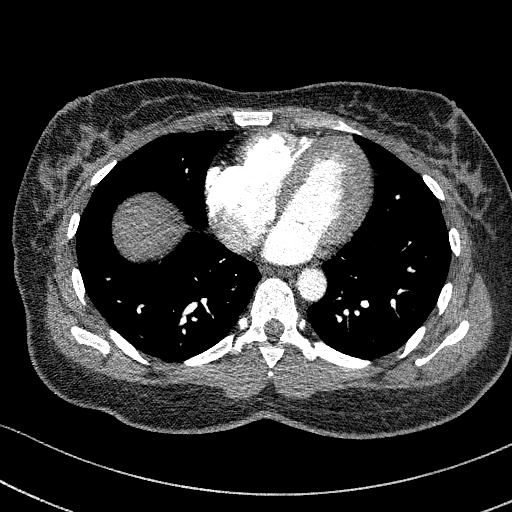
[im 125/268  lung]
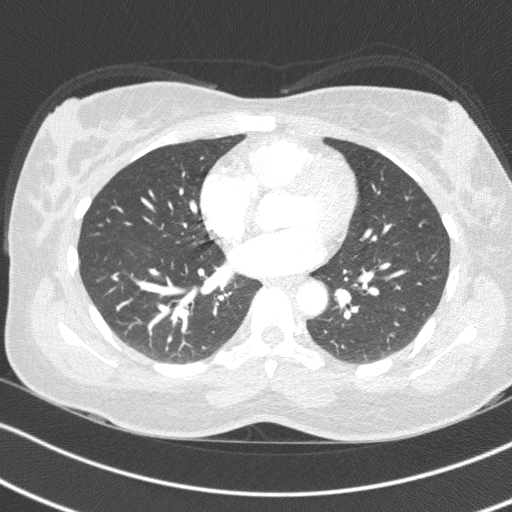
[im 143/268  soft-tissue]
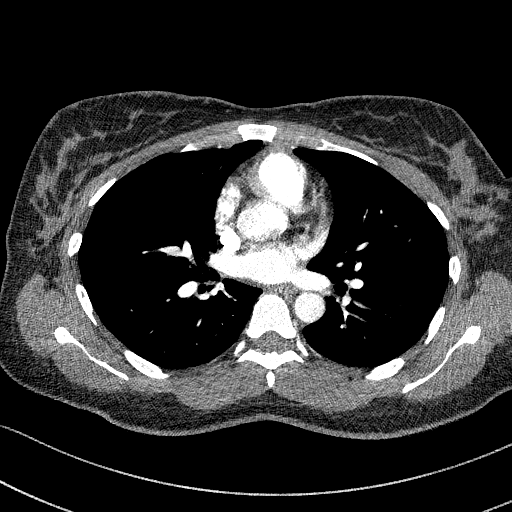
[im 161/268  lung]
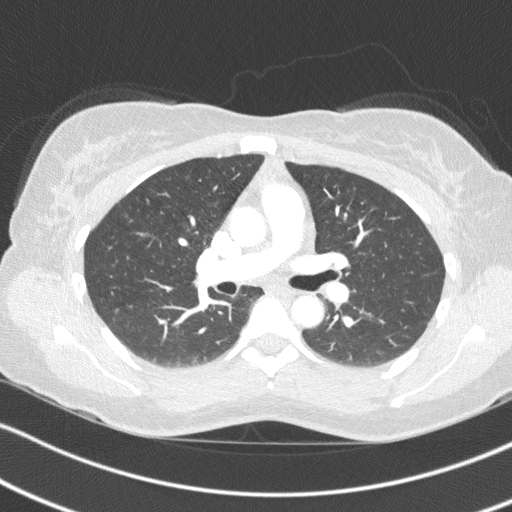
[im 179/268  soft-tissue]
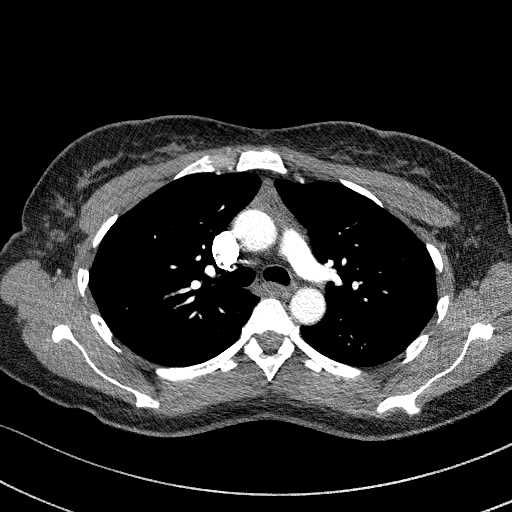
[im 196/268  lung]
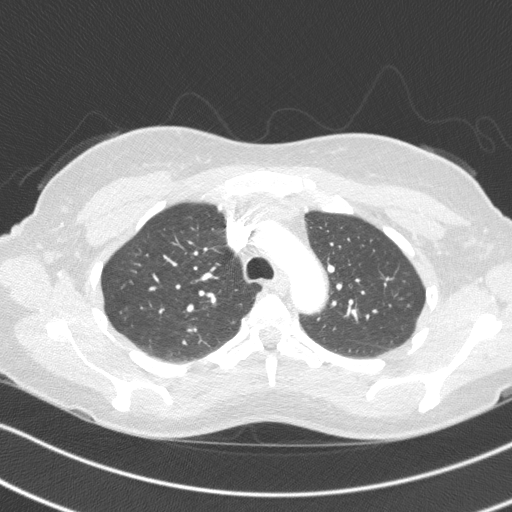
[im 214/268  soft-tissue]
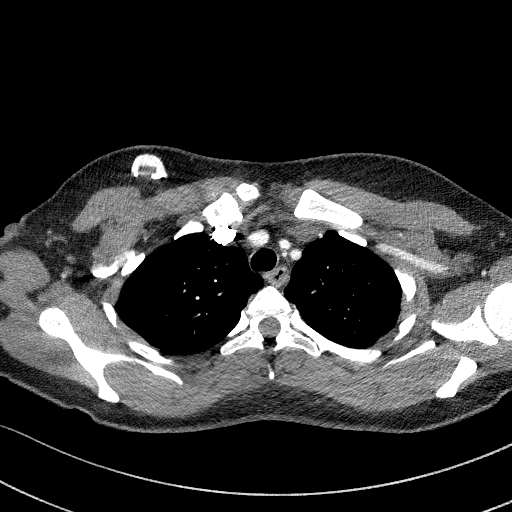
[im 232/268  lung]
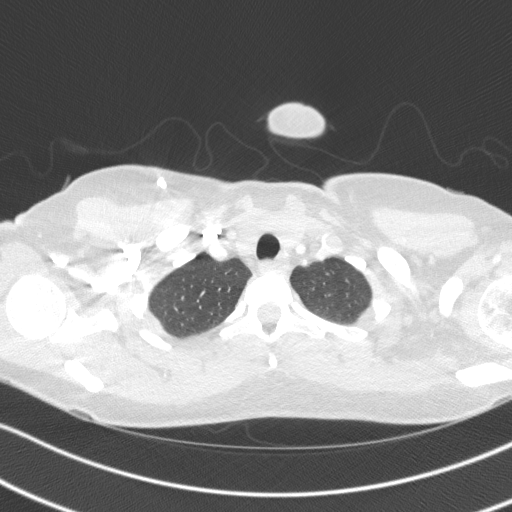
[im 250/268  soft-tissue]
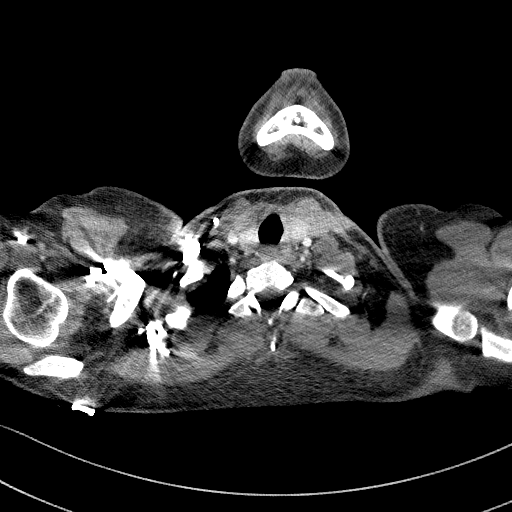

[Series 7: cor soft · coronal · 0.55mm/px · 3 of 131 slices shown]
[im 33/131  soft-tissue]
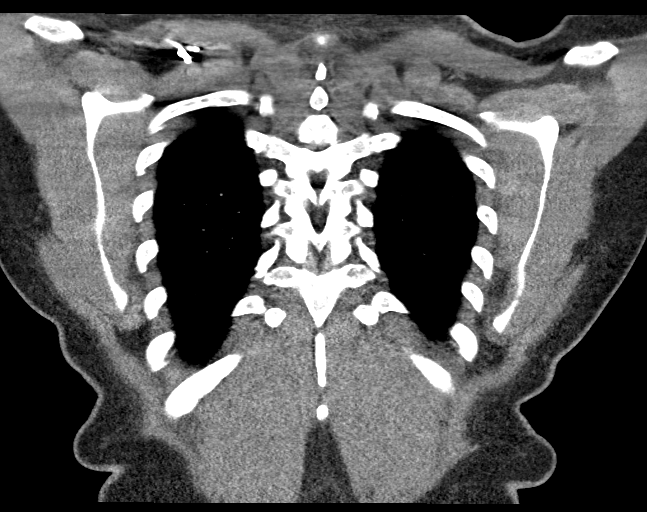
[im 66/131  soft-tissue]
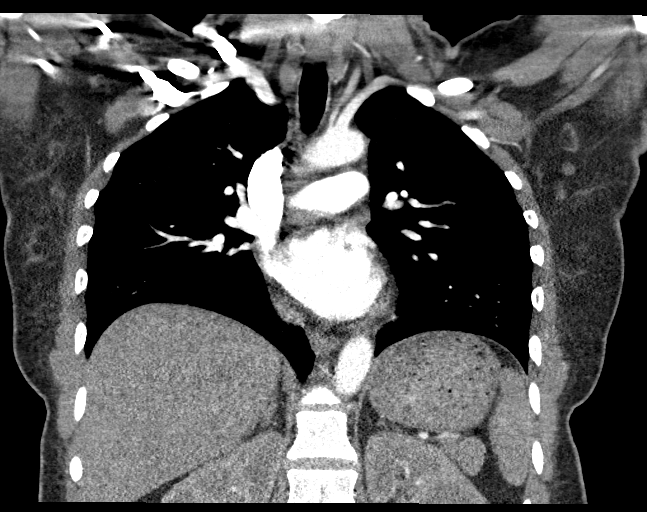
[im 98/131  soft-tissue]
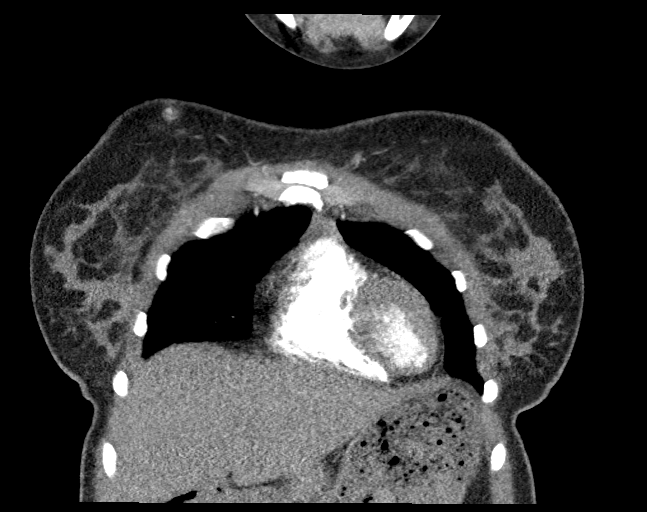

[17 of 46 positions shown; findings below may reference images not displayed]

RADIATION DOSE REDUCTION: This exam was performed according to the
departmental dose-optimization program which includes automated
exposure control, adjustment of the mA and/or kV according to
patient size and/or use of iterative reconstruction technique.

CONTRAST:  75mL OMNIPAQUE IOHEXOL 350 MG/ML SOLN
FINDINGS: Cardiovascular: The pulmonary arteries are well opacified. There is
no evidence pulmonary embolism. Central pulmonary arteries are
normal in caliber. The heart size is normal. No pericardial fluid
identified. The thoracic aorta is normal in caliber and demonstrates
no atherosclerosis. Normal variant origin of the left vertebral
artery off of the aortic arch.

Above the level of port insertion into the right internal jugular
vein, there is less visible distension of the right internal jugular
vein likely consistent with resolving thrombus. Patency of the vein
cannot be evaluated on the current study. The right subclavian and
innominate veins are opacified with contrast and normally patent.
The IVC is normally patent.

Mediastinum/Nodes: No enlarged mediastinal, hilar, or axillary lymph
nodes. Thyroid gland, trachea, and esophagus demonstrate no
significant findings.

Lungs/Pleura: There is no evidence of pulmonary edema,
consolidation, pneumothorax, nodule or pleural fluid.

Upper Abdomen: No acute abnormality.

Musculoskeletal: No chest wall abnormality. No acute or significant
osseous findings.

Review of the MIP images confirms the above findings.
IMPRESSION: 1. No evidence of pulmonary embolism or other acute findings in the
chest.
2. Less visible distension of the right internal jugular vein above
the level of port insertion. This likely reflects resolving
thrombus.

## 2021-03-20 MED ORDER — METHYLPREDNISOLONE 4 MG PO TBPK
ORAL_TABLET | ORAL | 0 refills | Status: DC
  Filled 2021-03-20 (×2): qty 21, 6d supply, fill #0

## 2021-03-20 MED ORDER — IOHEXOL 350 MG/ML SOLN
100.0000 mL | Freq: Once | INTRAVENOUS | Status: AC | PRN
  Administered 2021-03-20: 75 mL via INTRAVENOUS

## 2021-03-20 MED ORDER — CYCLOBENZAPRINE HCL 10 MG PO TABS
10.0000 mg | ORAL_TABLET | Freq: Two times a day (BID) | ORAL | 0 refills | Status: DC | PRN
  Filled 2021-03-20 (×2): qty 20, 10d supply, fill #0

## 2021-03-20 MED ORDER — CYCLOBENZAPRINE HCL 10 MG PO TABS: 10.0000 mg | ORAL_TABLET | Freq: Two times a day (BID) | ORAL | 0 refills | Status: DC | PRN

## 2021-03-20 MED ORDER — ACETAMINOPHEN 325 MG PO TABS
650.0000 mg | ORAL_TABLET | Freq: Once | ORAL | Status: AC
  Administered 2021-03-20: 650 mg via ORAL
  Filled 2021-03-20: qty 2

## 2021-03-20 MED ORDER — METHYLPREDNISOLONE 4 MG PO TBPK: ORAL_TABLET | ORAL | 0 refills | Status: DC

## 2021-03-20 NOTE — Discharge Instructions (Signed)
Recommend 1000 mg of Tylenol every 6 hours as needed for pain.  Take steroids as prescribed.  Flexeril as a mild muscle relaxant.  Do not use while driving or doing any other dangerous activities.

## 2021-03-20 NOTE — Progress Notes (Signed)
GYNECOLOGIC ONCOLOGY NEW PATIENT CONSULTATION   Patient Name: Sharon Bennett  Patient Age: 41 y.o. Date of Service: 03/20/21 Referring Provider: Dr. 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   Discussed cervical dysplasia history as well as more recent pap smears and HR HPV testing. Given persistent HR HPV infection, I recommended colposcopy with possible biopsies. I offered that this could be done today or with her OBGYN (we discussed cost difference that may exist for having procedure done with me versus OBGYN office). I think given almost 2 month break  in treatment before surgery, this is an ideal time for the procedure to be done. The patient ultimately decided that she wanted to do the procedure here today. Colposcopy was performed with several biopsies taken. 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 I will call her with biopsy results. Recommended follow-up will depend on biopsy results.  We also discussed the possibility of hysterectomy. I reviewed by hysterectomy is not recommended treatment in the setting of low-grade dysplasia. If she were to need hysterectomy to facilitate her LAR, I'm happy to be available.    A copy of this note was sent to the patient's referring provider.   55 minutes of total time was spent for this patient encounter, including preparation, face-to-face counseling with the patient and coordination of care, and documentation of the encounter.   Jeral Pinch, MD  Division of Gynecologic Oncology  Department of Obstetrics and Gynecology  [facility]  ___________________________________________  Chief Complaint: Chief Complaint  Patient presents with   Abnormal  cervical Papanicolaou smear, unspecified abnormal     History of Present Illness:  Sharon Bennett is a 41 y.o. y.o. female who is seen in consultation at the request of Dr. Dema Severin for an evaluation of a history of cervical dysplasia, HR HPV infection.   The patient had normal pap tests until 2015. 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 The patient had a LEEP procedure in 2015, no dysplasia was noted on final pathology. She reports having normal pap tests for several years. Pap test in 2021 was negative, high-risk HPV positive. Patient's last Pap smear was on 01/09/2021.  Pap smear showed ASCUS, was high risk HPV positive, negative for 16/18/45. Colposcopy was recommended by her OBGYN but given recent diagnosis and active treatment for her rectal cancer, decision was made with the patient to defer work-up until after completion of treatment.   The following is her treatment to date for rectal cancer diagnosed in fall of last year: Diagnosed with stage IIIC, cT3N2bM0, adenocarcinoma of the rectum 8 cycles of FOLFOX from 09/15/20-01/05/21. Radiation/Xeloda 12/19-22-03/03/21. 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 Scheduled for Robotic LAR with diverting loop ileostomy on 05/10/21 with Dr. Dema Severin.  Patient notes that she is recovering from radiation treatment.  She had some intense burning and itching of her vagina and vulva after radiation.  She took Azo initially for this she thought she had a urinary tract infection.  She has now finished a course of antibiotics after coming in to give urine and being treated for a UTI.  She has had some mucus discharge from either her rectum or vagina.  She denies any vaginal  bleeding.  She endorses increasing appetite now that she gets further out from treatment.  Denies any nausea or emesis.  Reports improving bowel function, uses Metamucil.  Other than previously described urinary symptoms, denies  any urinary issues.  GYN history: Patient is amenorrheic as she takes OCPs continuously.  She had previously been on a different birth control pill and was having bleeding on this.  She changed to a new pill and periods have stopped completely. She has history of documented uterine fibroids. Her last pelvic ultrasound on 09/12/2020 at [place] shows a uterus measuring 7.5 x 6.3 x 4.8 cm.  Multiple fibroids noted, the largest measuring 4.2 cm.  Bilateral ovaries noted to be normal in appearance.  PAST MEDICAL HISTORY:  Past Medical History:  Diagnosis Date   Colorectal cancer ([place])    Family history of brain cancer    Family history of breast cancer    Family history of pancreatic cancer      PAST SURGICAL HISTORY:  Past Surgical History:  Procedure Laterality Date   IR IMAGING GUIDED PORT INSERTION  09/09/2020   LEEP     2015    OB/GYN HISTORY:  OB History  Gravida Para Term Preterm AB Living  2 2 2  0 0 2  SAB IAB Ectopic Multiple Live Births  0 0 0 0 2    # Outcome Date GA Lbr Len/2nd Weight Sex Delivery Anes PTL Lv  2 Term           1 Term             No LMP recorded. (Menstrual status: Oral contraceptives).  Age at menarche: 26 Age at menopause: n/a Hx of HRT: n/a Hx of STDs: HPV Last pap: see HPI History of abnormal pap smears: yes  SCREENING STUDIES:  Last mammogram: 12/2020  Last colonoscopy: 2022  MEDICATIONS: Outpatient Encounter Medications as of 03/20/2021  Medication Sig   acetaminophen (TYLENOL) 500 MG tablet Take 1,000 mg by mouth every 6 (six) hours as needed for mild pain or headache.   apixaban (ELIQUIS) 5 MG TABS tablet Take 1 tablet (5 mg total) by mouth 2 (two) times daily.   apixaban (ELIQUIS) 5 MG TABS tablet Take 1 tablet (5 mg total) by mouth 2 (two) times daily.   aspirin-acetaminophen-caffeine (EXCEDRIN MIGRAINE) 250-250-65 MG tablet Take 1 tablet by mouth every 6 (six) hours as needed for headache.   melatonin 5 MG TABS Take 5 mg by  mouth at bedtime as needed (sleep).   ondansetron (ZOFRAN) 8 MG tablet Take 1 tablet (8 mg total) by mouth every 8 (eight) hours as needed for nausea or vomiting.   potassium chloride SA (KLOR-CON M) 20 MEQ tablet Take 1 tablet (20 mEq total) by mouth daily. (Patient taking differently: Take 20 mEq by mouth 2 (two) times daily. Twice daily as of 02/23/21)   SLYND 4 MG TABS Take 1 tablet by mouth daily.   capecitabine (XELODA) 500 MG tablet Take 3 tablets (1,500 mg total) by mouth 2 (two) times daily after a meal. Take Monday-Friday. Take only on days of radiation. (Patient not taking: Reported on 03/17/2021)   Drospirenone (SLYND) 4 MG TABS Take 1 tablet by mouth daily continuously (Patient not taking: Reported on 03/17/2021)   fluconazole (DIFLUCAN) 100 MG tablet Take 1 tablet (100 mg total) by mouth daily. (Patient not taking: Reported on 03/17/2021)   lidocaine-prilocaine (EMLA) cream Apply 1/2 tablespoon to port site and cover with plastic wrap 2 hours prior  to stick to numb site (Patient not taking: Reported on 03/09/2021)   magic mouthwash SOLN Take 5 mLs by mouth 4 (four) times daily as needed for mouth pain. (Patient not taking: Reported on 03/09/2021)   pantoprazole (PROTONIX) 40 MG tablet TAKE 1 TABLET BY MOUTH EVERY DAY (Patient not taking: Reported on 03/17/2021)   prochlorperazine (COMPAZINE) 10 MG tablet Take 1 tablet (10 mg total) by mouth every 6 (six) hours as needed for nausea or vomiting. (Patient not taking: Reported on 03/17/2021)   traMADol (ULTRAM) 50 MG tablet Take 1 tablet (50 mg total) by mouth every 12 (twelve) hours as needed. Do not drive while taking (Patient not taking: Reported on 03/17/2021)   No facility-administered encounter medications on file as of 03/20/2021.    ALLERGIES:  Allergies  Allergen Reactions   Codeine Nausea And Vomiting   Dilaudid [Hydromorphone] Nausea And Vomiting   Tegaderm Ag Mesh [Silver] Rash    Allergic reaction to Tegaderm dressing     FAMILY  HISTORY:  Family History  Problem Relation Age of Onset   Hypertension Mother    Heart failure Father    Hypertension Father    Pancreatic cancer Maternal Grandfather        dx 53s   Prostate cancer Maternal Grandfather    Brain cancer Paternal Grandmother        dx >50   Breast cancer Other 47       MGF's sister   Endometrial cancer Neg Hx    Ovarian cancer Neg Hx      SOCIAL HISTORY:  Social Connections: Not on file    REVIEW OF SYSTEMS:  Fatigue, urinary frequency Denies appetite changes, fevers, chills, unexplained weight changes. Denies hearing loss, neck lumps or masses, mouth sores, ringing in ears or voice changes. Denies cough or wheezing.  Denies shortness of breath. Denies chest pain or palpitations. Denies leg swelling. Denies abdominal distention, pain, blood in stools, constipation, diarrhea, nausea, vomiting, or early satiety. Denies pain with intercourse, dysuria, hematuria or incontinence. Denies hot flashes, pelvic pain, vaginal bleeding or vaginal discharge.   Denies joint pain, back pain or muscle pain/cramps. Denies itching, rash, or wounds. Denies dizziness, headaches, numbness or seizures. Denies swollen lymph nodes or glands, denies easy bruising or bleeding. Denies anxiety, depression, confusion, or decreased concentration.  Physical Exam:  Vital Signs for this encounter:  Blood pressure 124/78, pulse 98, temperature 98.7 F (37.1 C), temperature source Oral, resp. rate 18, height 5' 5.75" (1.67 m), weight 156 lb (70.8 kg), SpO2 100 %. Body mass index is 25.37 kg/m. General: Alert, oriented, no acute distress.  HEENT: Normocephalic, atraumatic. Sclera anicteric.  Chest: Clear to auscultation bilaterally. No wheezes, rhonchi, or rales. Cardiovascular: Regular rate and rhythm, no murmurs, rubs, or gallops.  Abdomen: Normoactive bowel sounds. Soft, nondistended, nontender to palpation. No masses or hepatosplenomegaly appreciated. No palpable fluid  wave.  Extremities: Grossly normal range of motion. Warm, well perfused. No edema bilaterally.  Skin: No rashes or lesions.  Lymphatics: No cervical, supraclavicular, or inguinal adenopathy.  GU:  Normal external female genitalia. No lesions. No discharge or bleeding.             Bladder/urethra:  No lesions or masses.             Vagina: Moderately atrophic with some radiation changes.              Cervix: Normal appearing, no lesions. Cervix deviated to the left and somewhat flush with the  vagina and left sidewall. Colposcopic findings noted below.              Uterus: Small, somewhat limited mobility, no parametrial involvement or nodularity.             Adnexa: No masses appreciated.  Rectal: Deferred.  Colposcopy and cervical biopsies Preoperative diagnosis: persistent HR HPV infection, history of cervical dysplasia Postoperative diagnosis: same as above Physician: Berline Lopes MD EBL: minimal Specimen: cervical biopsy at 9 o'clock, ECC Procedure: After the procedure was discussed with the patient, including risks and benefits, she gave verbal consent. She was placed in dorsal lithotomy position and the cervix was well visualized. 5% acetic acid was applied to the cervix. There is an area of acetowhite changes from 8-10 o'clock, no associated punctations or mosaicism.  Cervical biopsy taken at 9 o'clock.  Colposcopy unsatisfactory.  ECC performed with Kevorkian curette and cytobrush. Both specimens were placed in formalin to be sent to pathology. No significant bleeding noted. All instruments were removed from the vagina. Overall the patient tolerated the procedure well.  LABORATORY AND RADIOLOGIC DATA:  Outside medical records were reviewed to synthesize the above history, along with the history and physical obtained during the visit.   Lab Results  Component Value Date   WBC 4.8 03/20/2021   HGB 11.7 (L) 03/20/2021   HCT 37.0 03/20/2021   PLT 244 03/20/2021   GLUCOSE 85 03/20/2021    ALT 62 (H) 03/09/2021   AST 38 03/09/2021   NA 139 03/20/2021   K 3.5 03/20/2021   CL 104 03/20/2021   CREATININE 0.88 03/20/2021   BUN 11 03/20/2021   CO2 28 03/20/2021

## 2021-03-20 NOTE — Patient Instructions (Signed)
It was a pleasure meeting you today.  I will release your cervical biopsy and endocervical biopsy results to you in my chart.  If there is anything we need to speak about, I will give you a call.  As long as biopsy results do not show more than low-grade precancer, plan would be for repeat Pap test and HPV testing in a year.  Please let me know if your discussion with Dr. Dema Severin before your upcoming surgery involves consideration for hysterectomy at the time of your colon surgery.'  Do not hesitate to reach out if you need anything.  Our clinic number here is [phone].

## 2021-03-20 NOTE — ED Triage Notes (Signed)
Patient here POV from Home.  Patient endorses History of Colorectal Cancer and has Port in Right Chest for Treatment. Early January she was diagnosed with Right IJ Clot. Patient has been taking Eliquis since for Same.  Patient then began to have Pain to Right Shoulder/Right Upper Back that began 3 days ago and has been worsening since. Endorses Numbness radiating to Right Hand as well.   No Fevers. No SOB.   NAD Noted during Triage. A&Ox4. GCS 15. Ambulatory.

## 2021-03-20 NOTE — ED Provider Notes (Signed)
[facility] EMERGENCY DEPT Provider Note   CSN: [id number] Arrival date & time: 03/20/21  1458     History  Chief Complaint  Patient presents with   Shoulder Pain    Sharon Bennett is a 41 y.o. female.  Pain in the right subscapularis, upper back.  Having some paresthesias on the right arm.  Recently diagnosed with a right IJ thrombus and on Eliquis.  Being treated for colorectal cancer.  Denies any chest pain or shortness of breath.  Pain worse with movement.  Pain may be worse when she takes a deep breath.  She has been compliant with her Eliquis.  The history is provided by the patient.  Shoulder Pain Location:  Shoulder Shoulder location:  R shoulder Injury: no   Pain details:    Quality:  Aching   Radiates to:  Does not radiate   Severity:  Mild   Onset quality:  Gradual   Duration:  3 days   Timing:  Constant   Progression:  Unchanged Relieved by:  Nothing Ineffective treatments:  None tried Associated symptoms: back pain   Associated symptoms: no decreased range of motion, no fatigue, no fever, no muscle weakness, no neck pain, no numbness and no stiffness       Home Medications Prior to Admission medications   Medication Sig Start Date End Date Taking? Authorizing Provider  cyclobenzaprine (FLEXERIL) 10 MG tablet Take 1 tablet (10 mg total) by mouth 2 (two) times daily as needed for muscle spasms. 03/20/21  Yes Tristen Luce, DO  methylPREDNISolone (MEDROL DOSEPAK) 4 MG TBPK tablet Follow package insert 03/20/21  Yes Ransome Helwig, DO  acetaminophen (TYLENOL) 500 MG tablet Take 1,000 mg by mouth every 6 (six) hours as needed for mild pain or headache.    [provider]  apixaban (ELIQUIS) 5 MG TABS tablet Take 1 tablet (5 mg total) by mouth 2 (two) times daily. 02/09/21   Owens Shark, NP  apixaban (ELIQUIS) 5 MG TABS tablet Take 1 tablet (5 mg total) by mouth 2 (two) times daily. 02/09/21   Owens Shark, NP   aspirin-acetaminophen-caffeine (EXCEDRIN MIGRAINE) [phone] MG tablet Take 1 tablet by mouth every 6 (six) hours as needed for headache.    [provider]  capecitabine (XELODA) 500 MG tablet Take 3 tablets (1,500 mg total) by mouth 2 (two) times daily after a meal. Take Monday-Friday. Take only on days of radiation. Patient not taking: Reported on 03/17/2021 12/23/20   Ladell Pier, MD  Drospirenone (SLYND) 4 MG TABS Take 1 tablet by mouth daily continuously Patient not taking: Reported on 03/17/2021 01/19/21     fluconazole (DIFLUCAN) 100 MG tablet Take 1 tablet (100 mg total) by mouth daily. Patient not taking: Reported on 03/17/2021 01/05/21   Ladell Pier, MD  lidocaine-prilocaine (EMLA) cream Apply 1/2 tablespoon to port site and cover with plastic wrap 2 hours prior to stick to numb site Patient not taking: Reported on 03/09/2021 09/23/20   Ladell Pier, MD  magic mouthwash SOLN Take 5 mLs by mouth 4 (four) times daily as needed for mouth pain. Patient not taking: Reported on 03/09/2021 01/05/21   Ladell Pier, MD  melatonin 5 MG TABS Take 5 mg by mouth at bedtime as needed (sleep).    [provider]  ondansetron (ZOFRAN) 8 MG tablet Take 1 tablet (8 mg total) by mouth every 8 (eight) hours as needed for nausea or vomiting. 09/12/20   Ladell Pier,  MD  pantoprazole (PROTONIX) 40 MG tablet TAKE 1 TABLET BY MOUTH EVERY DAY Patient not taking: Reported on 03/17/2021 10/27/20   Ladell Pier, MD  potassium chloride SA (KLOR-CON M) 20 MEQ tablet Take 1 tablet (20 mEq total) by mouth daily. Patient taking differently: Take 20 mEq by mouth 2 (two) times daily. Twice daily as of 02/23/21 02/10/21   Owens Shark, NP  prochlorperazine (COMPAZINE) 10 MG tablet Take 1 tablet (10 mg total) by mouth every 6 (six) hours as needed for nausea or vomiting. Patient not taking: Reported on 03/17/2021 09/12/20   Ladell Pier, MD  SLYND 4 MG TABS Take 1 tablet by mouth daily.  11/28/20   [provider]  traMADol (ULTRAM) 50 MG tablet Take 1 tablet (50 mg total) by mouth every 12 (twelve) hours as needed. Do not drive while taking Patient not taking: Reported on 03/17/2021 02/10/21   Owens Shark, NP      Allergies    Codeine, Dilaudid [hydromorphone], and Tegaderm ag mesh [silver]    Review of Systems   Review of Systems  Constitutional:  Negative for fatigue and fever.  Musculoskeletal:  Positive for back pain. Negative for neck pain and stiffness.   Physical Exam Updated Vital Signs BP 119/84 (BP Location: Left Arm)    Pulse 96    Temp 98.5 F (36.9 C)    Resp 16    Ht 5' 5.75" (1.67 m)    Wt 70.8 kg    SpO2 100%    BMI 25.38 kg/m  Physical Exam Vitals and nursing note reviewed.  Constitutional:      General: She is not in acute distress.    Appearance: She is well-developed. She is not ill-appearing.  HENT:     Head: Normocephalic and atraumatic.  Eyes:     Extraocular Movements: Extraocular movements intact.     Conjunctiva/sclera: Conjunctivae normal.     Pupils: Pupils are equal, round, and reactive to light.  Cardiovascular:     Rate and Rhythm: Normal rate and regular rhythm.     Heart sounds: No murmur heard. Pulmonary:     Effort: Pulmonary effort is normal. No respiratory distress.     Breath sounds: Normal breath sounds.  Abdominal:     Palpations: Abdomen is soft.     Tenderness: There is no abdominal tenderness.  Musculoskeletal:        General: Tenderness present. No swelling. Normal range of motion.     Cervical back: Normal range of motion and neck supple. No tenderness.     Comments: Appears to have tenderness in the right subscapularis, right trapezius area, no midline spinal tenderness  Skin:    General: Skin is warm and dry.     Capillary Refill: Capillary refill takes less than 2 seconds.  Neurological:     General: No focal deficit present.     Mental Status: She is alert and oriented to person, place, and  time.     Cranial Nerves: No cranial nerve deficit.     Sensory: No sensory deficit.     Motor: No weakness.     Coordination: Coordination normal.     Comments: 5+ out of 5 strength throughout, normal sensation, no drift, normal finger-nose-finger  Psychiatric:        Mood and Affect: Mood normal.    ED Results / Procedures / Treatments   Labs (all labs ordered are listed, but only abnormal results are displayed) Labs Reviewed  CBC WITH DIFFERENTIAL/PLATELET - Abnormal; Notable for the following components:      Result Value   Hemoglobin 11.7 (*)    RDW 15.8 (*)    All other components within normal limits  BASIC METABOLIC PANEL  PREGNANCY, URINE    EKG None  Radiology CT Angio Chest PE W and/or Wo Contrast  Result Date: 03/20/2021 CLINICAL DATA:  Right shoulder and upper back pain. History of colon carcinoma with indwelling Port-A-Cath and history right jugular venous thrombosis. EXAM: CT ANGIOGRAPHY CHEST WITH CONTRAST TECHNIQUE: Multidetector CT imaging of the chest was performed using the standard protocol during bolus administration of intravenous contrast. Multiplanar CT image reconstructions and MIPs were obtained to evaluate the vascular anatomy. RADIATION DOSE REDUCTION: This exam was performed according to the departmental dose-optimization program which includes automated exposure control, adjustment of the mA and/or kV according to patient size and/or use of iterative reconstruction technique. CONTRAST:  79mL OMNIPAQUE IOHEXOL 350 MG/ML SOLN COMPARISON:  CT a of the chest and neck on 02/11/2021 FINDINGS: Cardiovascular: The pulmonary arteries are well opacified. There is no evidence pulmonary embolism. Central pulmonary arteries are normal in caliber. The heart size is normal. No pericardial fluid identified. The thoracic aorta is normal in caliber and demonstrates no atherosclerosis. Normal variant origin of the left vertebral artery off of the aortic arch. Above the level  of port insertion into the right internal jugular vein, there is less visible distension of the right internal jugular vein likely consistent with resolving thrombus. Patency of the vein cannot be evaluated on the current study. The right subclavian and innominate veins are opacified with contrast and normally patent. The IVC is normally patent. Mediastinum/Nodes: No enlarged mediastinal, hilar, or axillary lymph nodes. Thyroid gland, trachea, and esophagus demonstrate no significant findings. Lungs/Pleura: There is no evidence of pulmonary edema, consolidation, pneumothorax, nodule or pleural fluid. Upper Abdomen: No acute abnormality. Musculoskeletal: No chest wall abnormality. No acute or significant osseous findings. Review of the MIP images confirms the above findings. IMPRESSION: 1. No evidence of pulmonary embolism or other acute findings in the chest. 2. Less visible distension of the right internal jugular vein above the level of port insertion. This likely reflects resolving thrombus. Electronically Signed   By: Aletta Edouard M.D.   On: 03/20/2021 17:00    Procedures Procedures    Medications Ordered in ED Medications  acetaminophen (TYLENOL) tablet 650 mg (has no administration in time range)  iohexol (OMNIPAQUE) 350 MG/ML injection 100 mL (75 mLs Intravenous Contrast Given 03/20/21 1645)    ED Course/ Medical Decision Making/ A&P                           Medical Decision Making Amount and/or Complexity of Data Reviewed Labs: ordered. Radiology: ordered.  Risk OTC drugs. Prescription drug management.   MASIE BERMINGHAM is here with right shoulder, right upper back pain.  Comorbidities of colorectal cancer on chemotherapy and radiation, right IJ thrombus on Eliquis diagnosed last month.  Pain for the last several days.  Worse with movement but also with taking deep breaths.  Denies any shortness of breath.  She is tender in the right shoulder, right trap, right subscapularis area  to palpation.  Denies any trauma or falls.  There is no obvious deformity.  Shoulder does not appear to be dislocated.  She is neurovascularly neuromuscularly intact throughout.  She has clear breath sounds.  Differential diagnosis includes muscle spasm versus radiculopathy  versus possibly blood clot/pneumothorax given her history.  Will pursue work-up with CBC, BMP, troponin, CT scan of chest.  Will give Tylenol.  Upon my review and interpretation of the labs are no significant anemia, electrolyte abnormality, kidney injury.  Radiology report for CT scan shows no blood clot.  No pneumonia.  No pneumothorax.  Resolving right IJ clot/thrombus.  Overall suspect that this is a muscular process.  May be a nerve impingement.  Will prescribe Medrol Dosepak and muscle relaxant.  We will have her follow-up with her primary care doctor.  Discharged in good condition.  This chart was dictated using voice recognition software.  Despite best efforts to proofread,  errors can occur which can change the documentation meaning.         Final Clinical Impression(s) / ED Diagnoses Final diagnoses:  Acute pain of right shoulder    Rx / DC Orders ED Discharge Orders          Ordered    methylPREDNISolone (MEDROL DOSEPAK) 4 MG TBPK tablet        03/20/21 1706    cyclobenzaprine (FLEXERIL) 10 MG tablet  2 times daily PRN        03/20/21 1706              Antario Yasuda, DO 03/20/21 1708

## 2021-03-20 NOTE — Telephone Encounter (Signed)
Pt called in reference to having R shoulder blade pain and pain to R hand with tingling. Pt called to inquire if it had anything to do with the blood clot she had from her port. Returned call but saw Pt had already went to [facility].

## 2021-03-21 ENCOUNTER — Encounter: Payer: Self-pay | Admitting: Oncology

## 2021-03-21 DIAGNOSIS — B977 Papillomavirus as the cause of diseases classified elsewhere: Secondary | ICD-10-CM | POA: Insufficient documentation

## 2021-03-21 DIAGNOSIS — R87619 Unspecified abnormal cytological findings in specimens from cervix uteri: Secondary | ICD-10-CM | POA: Insufficient documentation

## 2021-03-23 LAB — SURGICAL PATHOLOGY

## 2021-03-27 ENCOUNTER — Emergency Department (HOSPITAL_BASED_OUTPATIENT_CLINIC_OR_DEPARTMENT_OTHER)
Admission: EM | Admit: 2021-03-27 | Discharge: 2021-03-27 | Disposition: A | Discharge status: Home / Self Care | Payer: 59 | Attending: Emergency Medicine | Admitting: Emergency Medicine

## 2021-03-27 ENCOUNTER — Other Ambulatory Visit (HOSPITAL_COMMUNITY): Payer: Self-pay

## 2021-03-27 ENCOUNTER — Other Ambulatory Visit: Payer: Self-pay | Admitting: Nurse Practitioner

## 2021-03-27 ENCOUNTER — Other Ambulatory Visit: Payer: Self-pay

## 2021-03-27 ENCOUNTER — Emergency Department (HOSPITAL_BASED_OUTPATIENT_CLINIC_OR_DEPARTMENT_OTHER): Payer: 59 | Admitting: Radiology

## 2021-03-27 ENCOUNTER — Encounter (HOSPITAL_BASED_OUTPATIENT_CLINIC_OR_DEPARTMENT_OTHER): Payer: Self-pay | Admitting: *Deleted

## 2021-03-27 DIAGNOSIS — M25511 Pain in right shoulder: Secondary | ICD-10-CM | POA: Insufficient documentation

## 2021-03-27 DIAGNOSIS — Z7901 Long term (current) use of anticoagulants: Secondary | ICD-10-CM | POA: Diagnosis not present

## 2021-03-27 DIAGNOSIS — Z85038 Personal history of other malignant neoplasm of large intestine: Secondary | ICD-10-CM | POA: Diagnosis not present

## 2021-03-27 DIAGNOSIS — Z7982 Long term (current) use of aspirin: Secondary | ICD-10-CM | POA: Insufficient documentation

## 2021-03-27 DIAGNOSIS — C20 Malignant neoplasm of rectum: Secondary | ICD-10-CM

## 2021-03-27 DIAGNOSIS — R000 Tachycardia, unspecified: Secondary | ICD-10-CM | POA: Diagnosis not present

## 2021-03-27 LAB — CBC WITH DIFFERENTIAL/PLATELET
Abs Immature Granulocytes: 0.01 10*3/uL (ref 0.00–0.07)
Basophils Absolute: 0 10*3/uL (ref 0.0–0.1)
Basophils Relative: 1 %
Eosinophils Absolute: 0.1 10*3/uL (ref 0.0–0.5)
Eosinophils Relative: 2 %
HCT: 36.7 % (ref 36.0–46.0)
Hemoglobin: 11.8 g/dL — ABNORMAL LOW (ref 12.0–15.0)
Immature Granulocytes: 0 %
Lymphocytes Relative: 21 %
Lymphs Abs: 0.9 10*3/uL (ref 0.7–4.0)
MCH: 28.4 pg (ref 26.0–34.0)
MCHC: 32.2 g/dL (ref 30.0–36.0)
MCV: 88.4 fL (ref 80.0–100.0)
Monocytes Absolute: 0.4 10*3/uL (ref 0.1–1.0)
Monocytes Relative: 10 %
Neutro Abs: 2.7 10*3/uL (ref 1.7–7.7)
Neutrophils Relative %: 66 %
Platelets: 237 10*3/uL (ref 150–400)
RBC: 4.15 MIL/uL (ref 3.87–5.11)
RDW: 16 % — ABNORMAL HIGH (ref 11.5–15.5)
WBC: 4.2 10*3/uL (ref 4.0–10.5)
nRBC: 0 % (ref 0.0–0.2)

## 2021-03-27 LAB — HCG, QUANTITATIVE, PREGNANCY: hCG, Beta Chain, Quant, S: <1 m[IU]/mL (ref <5)

## 2021-03-27 LAB — BASIC METABOLIC PANEL
Anion gap: 10 (ref 5–15)
BUN: 15 mg/dL (ref 6–20)
CO2: 24 mmol/L (ref 22–32)
Calcium: 9.3 mg/dL (ref 8.9–10.3)
Chloride: 103 mmol/L (ref 98–111)
Creatinine, Ser: 0.57 mg/dL (ref 0.44–1.00)
GFR, Estimated: >60 mL/min (ref >60)
Glucose, Bld: 90 mg/dL (ref 70–99)
Potassium: 3.7 mmol/L (ref 3.5–5.1)
Sodium: 137 mmol/L (ref 135–145)

## 2021-03-27 IMAGING — DX DG CHEST 2V
2 series · 2 of 2 positions shown · non-contrast
Comparison: Chest x-ray [DATE].

CLINICAL DATA: 40-year-old female with history of right-sided
shoulder pain.

EXAM:
CHEST - 2 VIEW

[chest pa]
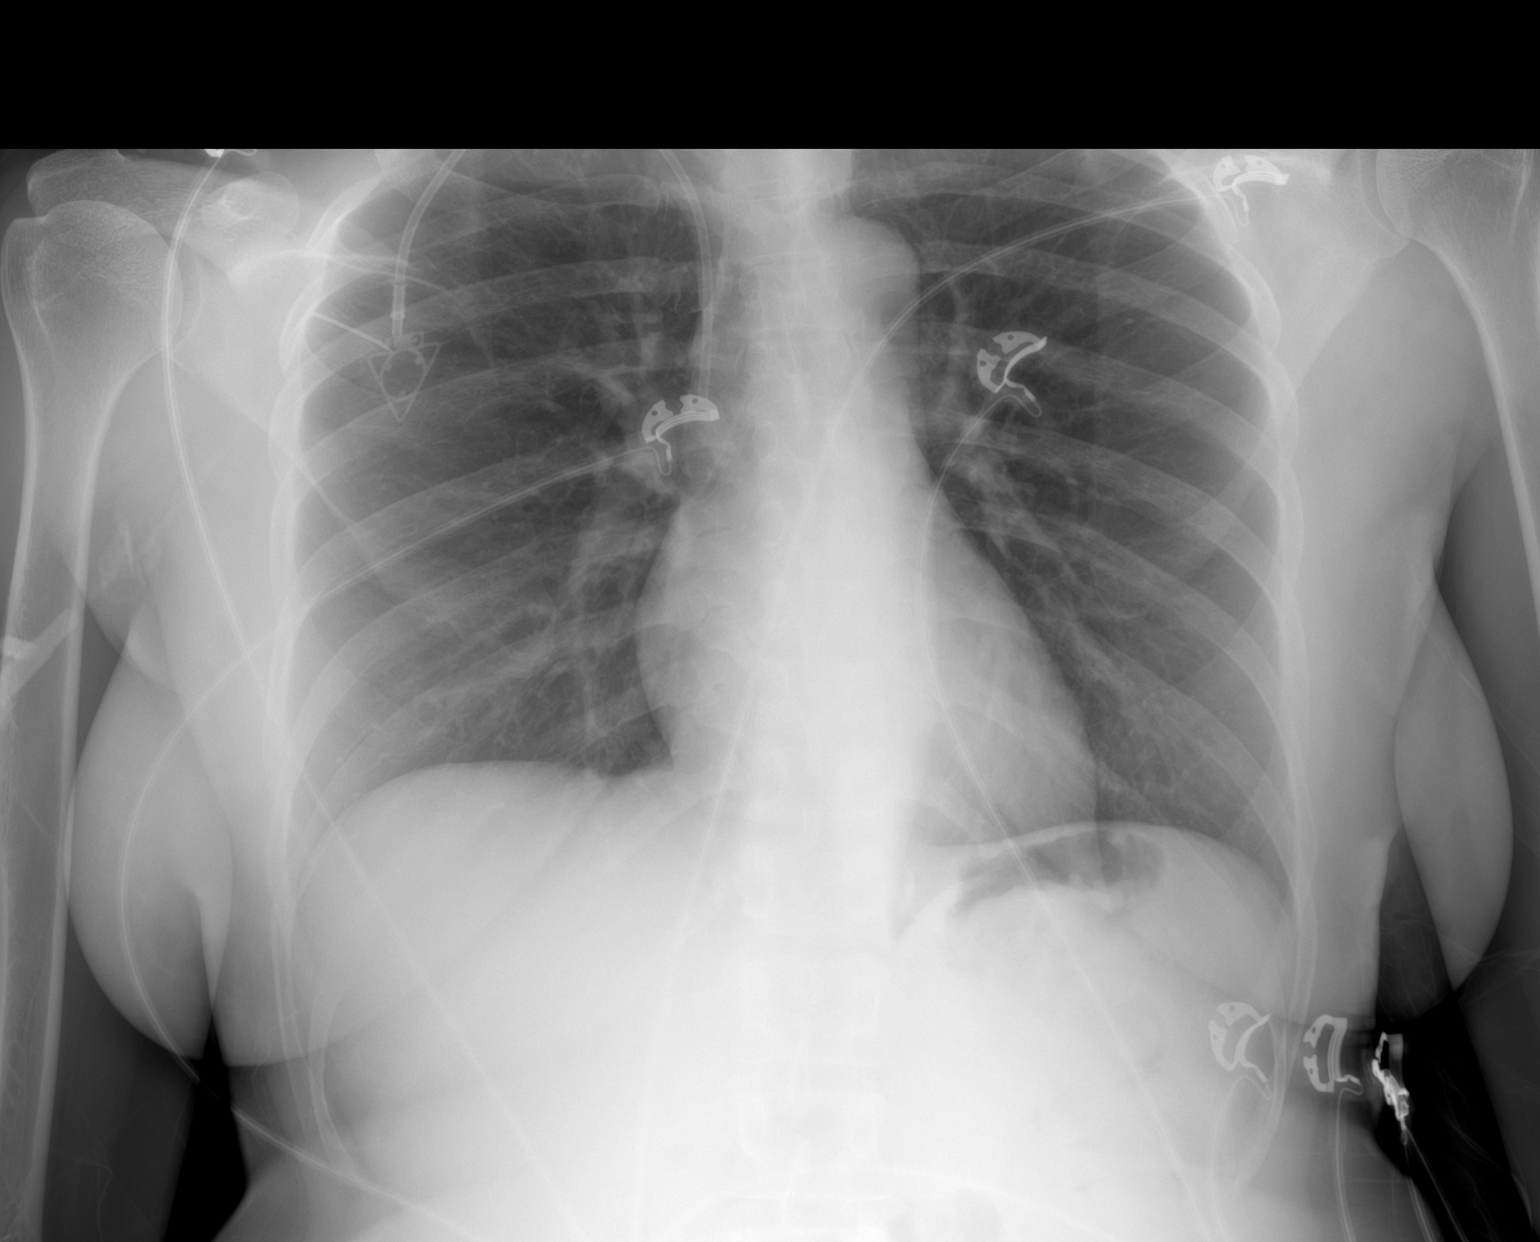

[chest lat]
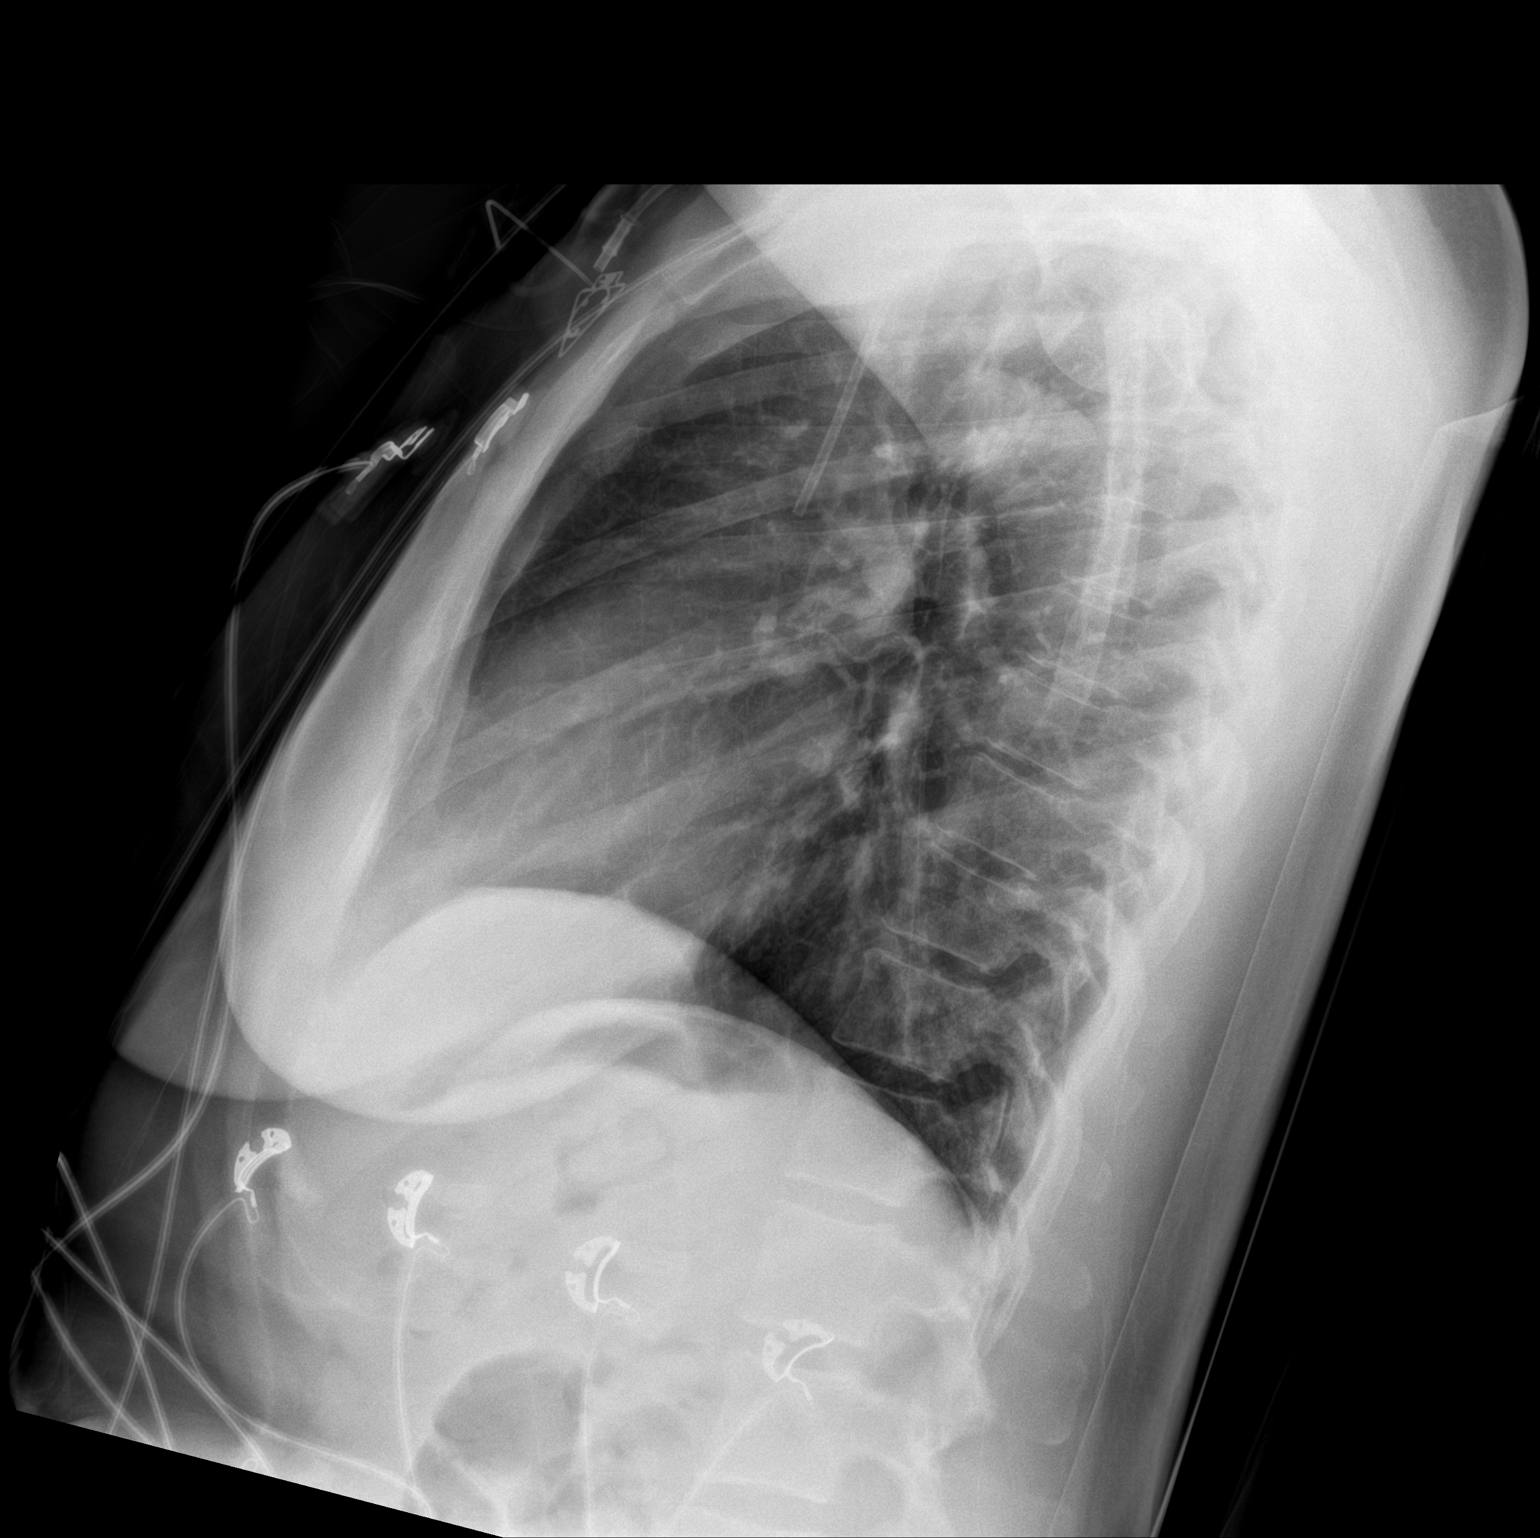

[2 of 2 positions shown; findings below may reference images not displayed]

FINDINGS: Right internal jugular single-lumen porta cath with tip terminating
in the distal superior vena cava. Lung volumes are normal. No
consolidative airspace disease. No pleural effusions. No
pneumothorax. No pulmonary nodule or mass noted. Pulmonary
vasculature and the cardiomediastinal silhouette are within normal
limits.
IMPRESSION: 1.  No radiographic evidence of acute cardiopulmonary disease.

## 2021-03-27 MED ORDER — FENTANYL CITRATE PF 50 MCG/ML IJ SOSY
50.0000 ug | PREFILLED_SYRINGE | Freq: Once | INTRAMUSCULAR | Status: AC
  Administered 2021-03-27: 50 ug via INTRAVENOUS
  Filled 2021-03-27: qty 1

## 2021-03-27 MED ORDER — TRAMADOL HCL 50 MG PO TABS: 50.0000 mg | ORAL_TABLET | Freq: Two times a day (BID) | ORAL | 0 refills | Status: DC | PRN

## 2021-03-27 MED ORDER — SODIUM CHLORIDE 0.9 % IV BOLUS
1000.0000 mL | Freq: Once | INTRAVENOUS | Status: AC
  Administered 2021-03-27: 1000 mL via INTRAVENOUS

## 2021-03-27 MED ORDER — MORPHINE SULFATE (PF) 4 MG/ML IV SOLN
4.0000 mg | Freq: Once | INTRAVENOUS | Status: AC
  Administered 2021-03-27: 4 mg via INTRAVENOUS
  Filled 2021-03-27: qty 1

## 2021-03-27 MED ORDER — HEPARIN SOD (PORK) LOCK FLUSH 100 UNIT/ML IV SOLN
500.0000 [IU] | Freq: Once | INTRAVENOUS | Status: AC
  Administered 2021-03-27: 500 [IU]
  Filled 2021-03-27: qty 5

## 2021-03-27 MED ORDER — LIDOCAINE 5 % EX PTCH
1.0000 | MEDICATED_PATCH | Freq: Every day | CUTANEOUS | 0 refills | Status: DC
  Filled 2021-03-27: qty 30, 30d supply, fill #0

## 2021-03-27 MED ORDER — ONDANSETRON HCL 4 MG/2ML IJ SOLN
2.0000 mg | Freq: Once | INTRAMUSCULAR | Status: AC
  Administered 2021-03-27: 2 mg via INTRAVENOUS
  Filled 2021-03-27: qty 2

## 2021-03-27 MED ORDER — LIDOCAINE 5 % EX PTCH
1.0000 | MEDICATED_PATCH | CUTANEOUS | Status: DC
  Administered 2021-03-27: 1 via TRANSDERMAL
  Filled 2021-03-27: qty 1

## 2021-03-27 MED ORDER — LIDOCAINE 5 % EX PTCH: 1.0000 | MEDICATED_PATCH | CUTANEOUS | 0 refills | Status: DC

## 2021-03-27 NOTE — Discharge Instructions (Addendum)
At this time there does not appear to be the presence of an emergent medical condition, however there is always the potential for conditions to change. Please read and follow the below instructions.  Please return to the Emergency Department immediately for any new or worsening symptoms. Please be sure to follow up with your Primary Care Provider within one week regarding your visit today; please call their office to schedule an appointment even if you are feeling better for a follow-up visit. Please go to your oncology appointment for further evaluation and treatment of your symptoms. You may use the Lidoderm patch as prescribed to help with your symptoms.  Lidoderm may be expensive so you may speak with your pharmacist about finding over-the-counter medications that work similarly.  Go to the nearest Emergency Department immediately if: You have fever or chills Your arm, hand, or fingers: Tingle. Are numb. Are swollen. Are painful. Turn white or blue. You have chest pain or trouble breathing You have dizziness or difficulty with vision You have difficulty speaking or trouble walking You have any new/concerning or worsening symptoms.    Please read the additional information packets attached to your discharge summary.  Do not take your medicine if  develop an itchy rash, swelling in your mouth or lips, or difficulty breathing; call 911 and seek immediate emergency medical attention if this occurs.  You may review your lab tests and imaging results in their entirety on your MyChart account.  Please discuss all results of fully with your primary care provider and other specialist at your follow-up visit.  Note: Portions of this text may have been transcribed using voice recognition software. Every effort was made to ensure accuracy; however, inadvertent computerized transcription errors may still be present.

## 2021-03-27 NOTE — ED Provider Notes (Signed)
[facility] EMERGENC[facility] DEPT Provider Note   CSN: [id number] Arrival date & time: 03/27/21  0907     History  Chief Complaint  Patient presents with   Shoulder Pain    Sharon Bennett is a 41 y.o. female history of colorectal cancer with right-sided port  Patient reports right shoulder pain onset 2 weeks ago, no clear inciting event pain is tight aching constant nonradiating worsens with movement of the right shoulder and neck, no alleviating factors has attempted steroids and muscle relaxers without significant relief.  Associated with a tingling sensation to the palmar aspect of her right hand and right fingers.  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  Denies fall/injury, fever, chills, headache/vision change, extremity weakness, saddle paresthesias, bowel/bladder continence, urinary retention, chest pain/shortness of breath, hemoptysis, abdominal pain or any additional concerns.  HPI     Home Medications Prior to Admission medications   Medication Sig Start Date End Date Taking? Authorizing Provider  acetaminophen (TYLENOL) 500 MG tablet Take 1,000 mg by mouth every 6 (six) hours as needed for mild pain or headache.    [provider]  apixaban (ELIQUIS) 5 MG TABS tablet Take 1 tablet (5 mg total) by mouth 2 (two) times daily. 02/09/21   Owens Shark, NP  apixaban (ELIQUIS) 5 MG TABS tablet Take 1 tablet (5 mg total) by mouth 2 (two) times daily. 02/09/21   Owens Shark, NP  aspirin-acetaminophen-caffeine (EXCEDRIN MIGRAINE) [phone] MG tablet Take 1 tablet by mouth every 6 (six) hours as needed for headache.    [provider]  cyclobenzaprine (FLEXERIL) 10 MG tablet Take 1 tablet (10 mg total) by mouth 2 (two) times daily as needed for muscle spasms. 03/20/21   Curatolo, Adam, DO  fluconazole (DIFLUCAN) 100 MG tablet Take 1 tablet (100 mg total) by mouth daily. Patient not  taking: Reported on 03/17/2021 01/05/21   Ladell Pier, MD  lidocaine (LIDODERM) 5 % Place 1 patch onto the skin daily. Remove & Discard patch within 12 hours or as directed by MD 03/27/21  Yes Nuala Alpha A, PA-C  lidocaine-prilocaine (EMLA) cream Apply 1/2 tablespoon to port site and cover with plastic wrap 2 hours prior to stick to numb site Patient not taking: Reported on 03/09/2021 09/23/20   Ladell Pier, MD  magic mouthwash SOLN Take 5 mLs by mouth 4 (four) times daily as needed for mouth pain. Patient not taking: Reported on 03/09/2021 01/05/21   Ladell Pier, MD  melatonin 5 MG TABS Take 5 mg by mouth at bedtime as needed (sleep).    [provider]  methylPREDNISolone (MEDROL DOSEPAK) 4 MG TBPK tablet Take as directed per package 03/20/21   Curatolo, Adam, DO  ondansetron (ZOFRAN) 8 MG tablet Take 1 tablet (8 mg total) by mouth every 8 (eight) hours as needed for nausea or vomiting. 09/12/20   Ladell Pier, MD  pantoprazole (PROTONIX) 40 MG tablet TAKE 1 TABLET BY MOUTH EVERY DAY Patient not taking: Reported on 03/17/2021 10/27/20   Ladell Pier, MD  potassium chloride SA (KLOR-CON M) 20 MEQ tablet Take 1 tablet (20 mEq total) by mouth daily. Patient taking differently: Take 20 mEq by mouth 2 (two) times daily. Twice daily as of 02/23/21 02/10/21   Owens Shark, NP  prochlorperazine (COMPAZINE) 10 MG tablet Take 1 tablet (10 mg total) by mouth every 6 (six) hours as needed  for nausea or vomiting. Patient not taking: Reported on 03/17/2021 09/12/20   Ladell Pier, MD  SLYND 4 MG TABS Take 1 tablet by mouth daily. 11/28/20   [provider]  traMADol (ULTRAM) 50 MG tablet Take 1 tablet (50 mg total) by mouth every 12 (twelve) hours as needed. Do not drive while taking 1/75/10   Owens Shark, NP      Allergies    Codeine, Dilaudid [hydromorphone], and Tegaderm ag mesh [silver]    Review of Systems   Review of Systems Ten systems are reviewed and are  negative for acute change except as noted in the HPI  Physical Exam Updated Vital Signs BP 108/90    Pulse 95    Temp 98 F (36.7 C)    Resp 16    SpO2 100%  Physical Exam Constitutional:      General: She is not in acute distress.    Appearance: Normal appearance. She is well-developed. She is not ill-appearing or diaphoretic.  HENT:     Head: Normocephalic and atraumatic.  Eyes:     General: Vision grossly intact. Gaze aligned appropriately.     Pupils: Pupils are equal, round, and reactive to light.  Neck:     Trachea: Trachea and phonation normal.  Cardiovascular:     Rate and Rhythm: Normal rate and regular rhythm.  Pulmonary:     Effort: Pulmonary effort is normal. No respiratory distress.  Abdominal:     General: There is no distension.     Palpations: Abdomen is soft.     Tenderness: There is no abdominal tenderness. There is no guarding or rebound.  Musculoskeletal:        General: Normal range of motion.     Cervical back: Normal range of motion.     Comments: Full ROM strength all major joints of bilateral upper and lower extremities.  No limitation with ROM of the right shoulder.  Radial pulses intact and equal bilaterally.  Pedal pulses intact and equal bilaterally.  Capillary refill and sensation intact slightly diminished sensation right fingers compared to left.  Patient reports history of neuropathy unclear if it has changed recently.  Negative clonus bilaterally.  Negative Hoffmann's bilaterally.  Skin:    General: Skin is warm and dry.  Neurological:     Mental Status: She is alert.     GCS: GCS eye subscore is 4. GCS verbal subscore is 5. GCS motor subscore is 6.     Comments: Speech is clear and goal oriented, follows commands Major Cranial nerves without deficit, no facial droop Moves extremities without ataxia, coordination intact  Psychiatric:        Behavior: Behavior normal.    ED Results / Procedures / Treatments   Labs (all labs ordered are  listed, but only abnormal results are displayed) Labs Reviewed  CBC WITH DIFFERENTIAL/PLATELET - Abnormal; Notable for the following components:      Result Value   Hemoglobin 11.8 (*)    RDW 16.0 (*)    All other components within normal limits  CULTURE, BLOOD (SINGLE)  BASIC METABOLIC PANEL  HCG, QUANTITATIVE, PREGNANCY    EKG EKG Interpretation  Date/Time:  Monday March 27 2021 09:53:51 EST Ventricular Rate:  102 PR Interval:  157 QRS Duration: 82 QT Interval:  331 QTC Calculation: 432 R Axis:   81 Text Interpretation: Sinus tachycardia Borderline T wave abnormalities Confirmed by Lennice Sites ([id number]) on 03/27/2021 10:11:27 AM  Radiology DG Chest 2 View  Result Date: 03/27/2021 CLINICAL DATA:  41 year old female with history of right-sided shoulder pain. EXAM: CHEST - 2 VIEW COMPARISON:  Chest x-ray 12/13/2020. FINDINGS: Right internal jugular single-lumen porta cath with tip terminating in the distal superior vena cava. Lung volumes are normal. No consolidative airspace disease. No pleural effusions. No pneumothorax. No pulmonary nodule or mass noted. Pulmonary vasculature and the cardiomediastinal silhouette are within normal limits. IMPRESSION: 1.  No radiographic evidence of acute cardiopulmonary disease. Electronically Signed   By: Vinnie Langton M.D.   On: 03/27/2021 10:56    Procedures Procedures    Medications Ordered in ED Medications  lidocaine (LIDODERM) 5 % 1 patch (1 patch Transdermal Patch Applied 03/27/21 1007)  fentaNYL (SUBLIMAZE) injection 50 mcg (50 mcg Intravenous Given 03/27/21 1007)  sodium chloride 0.9 % bolus 1,000 mL (1,000 mLs Intravenous New Bag/Given 03/27/21 1009)  ondansetron (ZOFRAN) injection 2 mg (2 mg Intravenous Given 03/27/21 1015)  morphine (PF) 4 MG/ML injection 4 mg (4 mg Intravenous Given 03/27/21 1104)    ED Course/ Medical Decision Making/ A&P                           Medical Decision Making Amount and/or Complexity of Data  Reviewed Labs: ordered. Radiology: ordered.  Risk Prescription drug management.   CTA PE study last week was negative for pulmonary embolus and showed some improvement of the right internal jugular thrombosis which was found in early January.  Beta-hCG negative.  BMP within normal limits, no emergent lecture derangement, AKI or gap.  CBC shows no looks ptosis to suggest infectious process, mild anemia of 11.8 similar to prior.  No thrombocytopenia.  EKG without acute ischemic changes reviewed by Dr. Ronnald Nian.  Radiology interpretation of chest x-ray shows no acute cardiopulmonary processes, I have personally reviewed chest x-ray and agree with radiologist interpretation. ------------------------ Work-up today does not show any emergent causes of patient's right sided shoulder/neck pain.  Patient does have a palpable muscle spasm of the right trapezius and right parascapular muscles which may be the source of her complaint.  She has a reassuring neurologic examination, no evidence for myelopathy.  Additionally low suspicion for ACS, PE, dissection, meningitis or other emergent causes of her pain.  I consulted oncology and spoke to [facility] NP, advises that neuropathy can worsen after patient's chemotherapy and they would see the patient this week for recheck; the plan to discuss outpatient MRI of the cervical spine for possible radicular symptoms.  They are sending in a refill for patient's tramadol.  I advised patient of oncology recommendations and she stated understanding, she is agreeable for discharge.  I have sent the patient a course of Lidoderm patches to help with her symptoms.     At this time there does not appear to be any evidence of an acute emergency medical condition and the patient appears stable for discharge with appropriate outpatient follow up. Diagnosis was discussed with patient who verbalizes understanding of care plan and is agreeable to discharge. I have discussed return  precautions with patient and her son at bedside who verbalizes understanding. Patient encouraged to follow-up with their PCP and oncology. All questions answered.  Patient's case discussed with Dr. Ronnald Nian who agrees with plan to discharge with outpatient oncology follow-up.   Note: Portions of this report may have been transcribed using voice recognition software. Every effort was made to ensure accuracy; however, inadvertent computerized transcription errors may still be present.  Final Clinical Impression(s) / ED Diagnoses Final diagnoses:  Right shoulder pain, unspecified chronicity    Rx / DC Orders ED Discharge Orders          Ordered    lidocaine (LIDODERM) 5 %  Every 24 hours        03/27/21 1307              Gari Crown 03/27/21 1308    Lennice Sites, DO 03/27/21 1357

## 2021-03-27 NOTE — ED Triage Notes (Signed)
Pt is here for continued right shoulder pain with pain radiating down right arm and into right side of neck.  Pain increases with movement of arm.

## 2021-03-30 ENCOUNTER — Other Ambulatory Visit (HOSPITAL_COMMUNITY): Payer: Self-pay

## 2021-03-30 ENCOUNTER — Inpatient Hospital Stay: Payer: 59 | Admitting: Nurse Practitioner

## 2021-03-30 ENCOUNTER — Ambulatory Visit (INDEPENDENT_AMBULATORY_CARE_PROVIDER_SITE_OTHER): Payer: 59

## 2021-03-30 ENCOUNTER — Encounter: Payer: Self-pay | Admitting: Nurse Practitioner

## 2021-03-30 ENCOUNTER — Other Ambulatory Visit: Payer: Self-pay

## 2021-03-30 VITALS — BP 119/98 | HR 120 | Temp 97.8°F | Resp 18 | Ht 65.0 in | Wt 160.8 lb

## 2021-03-30 DIAGNOSIS — C20 Malignant neoplasm of rectum: Secondary | ICD-10-CM

## 2021-03-30 DIAGNOSIS — I82C11 Acute embolism and thrombosis of right internal jugular vein: Secondary | ICD-10-CM | POA: Diagnosis not present

## 2021-03-30 DIAGNOSIS — R300 Dysuria: Secondary | ICD-10-CM | POA: Diagnosis not present

## 2021-03-30 DIAGNOSIS — Z87410 Personal history of cervical dysplasia: Secondary | ICD-10-CM | POA: Diagnosis not present

## 2021-03-30 DIAGNOSIS — R200 Anesthesia of skin: Secondary | ICD-10-CM | POA: Diagnosis not present

## 2021-03-30 DIAGNOSIS — A630 Anogenital (venereal) warts: Secondary | ICD-10-CM | POA: Diagnosis not present

## 2021-03-30 DIAGNOSIS — R87619 Unspecified abnormal cytological findings in specimens from cervix uteri: Secondary | ICD-10-CM | POA: Diagnosis not present

## 2021-03-30 DIAGNOSIS — R202 Paresthesia of skin: Secondary | ICD-10-CM | POA: Diagnosis not present

## 2021-03-30 DIAGNOSIS — N898 Other specified noninflammatory disorders of vagina: Secondary | ICD-10-CM | POA: Diagnosis not present

## 2021-03-30 DIAGNOSIS — M79601 Pain in right arm: Secondary | ICD-10-CM

## 2021-03-30 MED ORDER — HYDROCODONE-ACETAMINOPHEN 5-325 MG PO TABS
1.0000 | ORAL_TABLET | Freq: Four times a day (QID) | ORAL | 0 refills | Status: DC | PRN
  Filled 2021-03-30: qty 30, 8d supply, fill #0

## 2021-03-30 NOTE — Progress Notes (Signed)
[facility] OFFICE PROGRESS NOTE   Diagnosis: Rectal cancer  INTERVAL HISTORY:   Sharon Bennett returns prior to scheduled follow-up.  She completed the course of radiation/Xeloda 03/03/2021.  She is scheduled for surgery 05/10/2021.  She was seen in the emergency department 03/20/2021 for evaluation of right upper back pain.  Chest CT was negative for PE, other acute findings.  Right internal jugular vein above the level port insertion with less visible distention felt to likely be resolving thrombus.  Per ER physician note muscular process suspected, may be nerve impingement.  Medrol Dosepak and muscle relaxant prescribed.  Recommended follow-up with PCP.  She was seen in the emergency room 03/27/2021 for evaluation of same pain.  Chest x-ray negative for acute process.  Per ED provider note she had a palpable muscle spasm right trapezius and right parascapular muscles felt to possibly be source of her complaint.  She was discharged home.  She reports continued pain right shoulder/right arm.  She feels like she has a "blood pressure cuff" around the upper arm.  She describes the pain as "burning, aching, numbness".  The pain extends from the upper arm to the hand with certain movements.  The arm feels weak.  She is not sure if she has any swelling.  No improvement following the Medrol Dosepak.  She denies fever, cough, shortness of breath.  No bleeding.  She reports receiving fentanyl and morphine in the emergency room with no improvement in her pain.  Objective:  Vital signs in last 24 hours:  Blood pressure (!) 119/98, pulse (!) 120, temperature 97.8 F (36.6 C), resp. rate 18, height $RemoveBe'5\' 5"'[id number]$  (1.651 m), weight 160 lb 12.8 oz (72.9 kg), SpO2 100 %.   Resp: Lungs clear bilaterally. Cardio: Regular, tachycardic. GI: Abdomen soft and nontender.  No hepatomegaly. Vascular: Question trace edema right upper arm.  The right neck does not appear edematous. Neuro: She seems to have mild weakness  in the right upper arm but not clear if the weakness is due to pain.  Grip strength is intact. Port-A-Cath without erythema.  No significant tenderness.  Lab Results:  Lab Results  Component Value Date   WBC 4.2 03/27/2021   HGB 11.8 (L) 03/27/2021   HCT 36.7 03/27/2021   MCV 88.4 03/27/2021   PLT 237 03/27/2021   NEUTROABS 2.7 03/27/2021    Imaging:  No results found.  Medications: I have reviewed the patient's current medications.  Assessment/Plan: Rectal cancer  CT abdomen/pelvis 08/21/2020-large rectal mass measuring 4.4 x 2.6 x 5.5 cm, haziness in the mesorectal fat, obscuration of the fat plane between the mass and the adjacent posterior aspect of the uterus, numerous prominent mesorectal lymph nodes measuring up to 5 mm Colonoscopy 08/23/2020-fungating infiltrative nonobstructing large mass found in the rectum.  Mass was noncircumferential, measured 5 cm in length, 3 cm in diameter and was 7 cm from the anal verge.  No bleeding was present.   Pathology showed invasive colonic adenocarcinoma, moderately differentiated.  No evidence of mismatch repair deficiency. Chest CT 09/01/2020-no evidence of metastatic disease MRI pelvis 09/02/2020-tumor at 10.4 cm from anal verge, 5.9 cm from the internal anal sphincter, T3, approximately 15 greater than 5 mm lymph nodes, N2b, multiple uterine fibroids Cycle 1 FOLFOX 09/15/2020 Cycle 2 FOLFOX 09/29/2020 Cycle 3 FOLFOX 10/13/2020 Cycle 4 FOLFOX 10/27/2020 Cycle 5 FOLFOX 11/10/2020 Cycle 6 FOLFOX 11/24/2020 Cycle 7 FOLFOX 12/08/2020 Cycle 8 FOLFOX 01/05/2021, Ziextenzo Radiation/Xeloda 01/23/2021-03/03/2021 2.   rectal bleeding, change in bowel habits  secondary to #1 3.   Uterine fibroids noted on MRI pelvis 09/02/2020 4.   Port-A-Cath placement 09/09/2020, Interventional Radiology 5.  Admission 12/14/2020 with febrile neutropenia, E. coli bacteremia-completed an outpatient course of Augmentin 6.  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  She had a venous Doppler of the right upper extremity today.  Preliminary report shows the right internal jugular vein DVT now is nonocclusive (still clot within) and the innominate vein looks clear.  We are referring her for an MRI of the cervical spine.  Prescription for hydrocodone sent to her pharmacy.  Patient seen with [name].  [name] ANP/GNP-BC   03/30/2021  1:18 PM This was a shared visit with [name].  Sharon Bennett was interviewed and examined.  She has pain at the right arm and chest.  We reviewed recent imaging studies and I discussed the case with interventional radiology.  Recent imaging reveals improvement in the IJ thrombus and no evidence of clot in the right upper extremity. The differential diagnosis includes pain related to the right upper extremity thrombus, benign musculoskeletal pain such as cervical disc disease, and less likely pain related rectal cancer.  We will refer her for an MRI of the cervical spine.  I was present for greater than 50% of today's visit.  I performed medical decision making.  Sharon Manson, MD

## 2021-03-31 ENCOUNTER — Ambulatory Visit (HOSPITAL_COMMUNITY)
Admission: RE | Admit: 2021-03-31 | Discharge: 2021-03-31 | Disposition: A | Discharge status: Home / Self Care | Payer: 59 | Source: Ambulatory Visit | Attending: Nurse Practitioner | Admitting: Nurse Practitioner

## 2021-03-31 ENCOUNTER — Telehealth: Payer: Self-pay | Admitting: Nurse Practitioner

## 2021-03-31 DIAGNOSIS — M50221 Other cervical disc displacement at C4-C5 level: Secondary | ICD-10-CM | POA: Diagnosis not present

## 2021-03-31 DIAGNOSIS — M50223 Other cervical disc displacement at C6-C7 level: Secondary | ICD-10-CM | POA: Diagnosis not present

## 2021-03-31 DIAGNOSIS — M50222 Other cervical disc displacement at C5-C6 level: Secondary | ICD-10-CM | POA: Diagnosis not present

## 2021-03-31 DIAGNOSIS — C20 Malignant neoplasm of rectum: Secondary | ICD-10-CM | POA: Insufficient documentation

## 2021-03-31 DIAGNOSIS — Z85048 Personal history of other malignant neoplasm of rectum, rectosigmoid junction, and anus: Secondary | ICD-10-CM | POA: Diagnosis not present

## 2021-03-31 IMAGING — MR MR CERVICAL SPINE W/O CM
6 series · 36 of 48 positions shown · non-contrast
Comparison: None.

CLINICAL DATA: History of rectal cancer. Right upper back and arm
pain with radicular nature, beginning 2 weeks ago.

EXAM:
MRI CERVICAL SPINE WITHOUT CONTRAST
TECHNIQUE: Multiplanar, multisequence MR imaging of the cervical spine was
performed. No intravenous contrast was administered.

[Series 5: T1 · sagittal · 3.0mm · 0.69mm/px · 6 of 15 slices shown (1 of 2)]
[im 1/15]
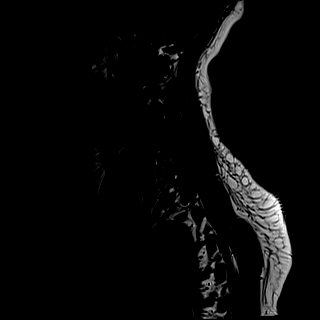
[im 3/15]
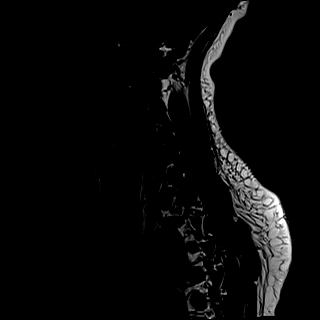
[im 6/15]
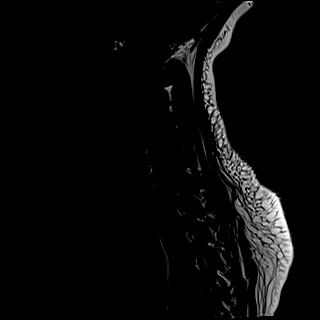
[im 9/15]
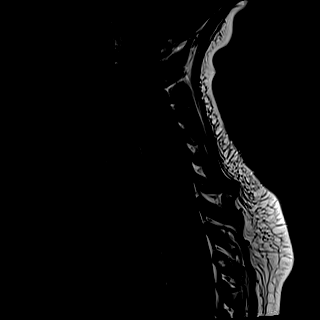
[im 12/15]
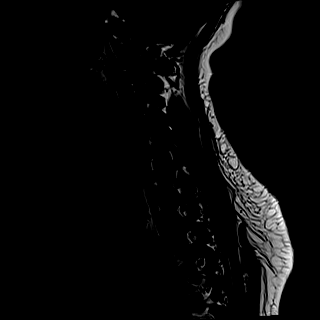
[im 15/15]
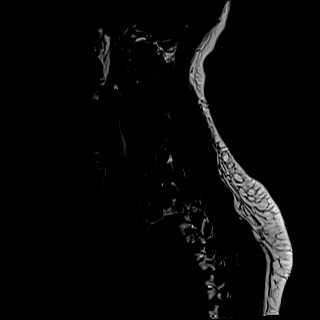

[Series 6: T2 · sagittal · 3.0mm · 0.69mm/px · 6 of 15 slices shown (1 of 2)]
[im 1/15]
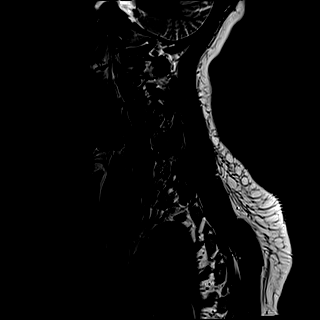
[im 3/15]
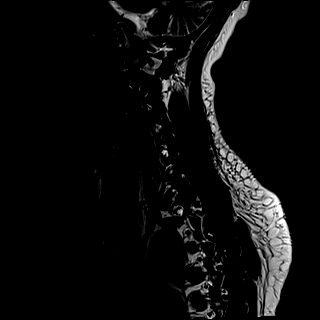
[im 6/15]
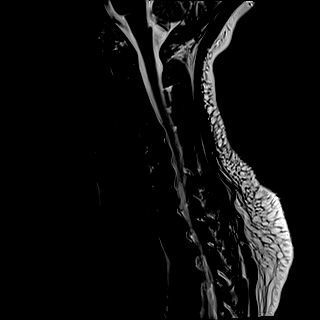
[im 9/15]
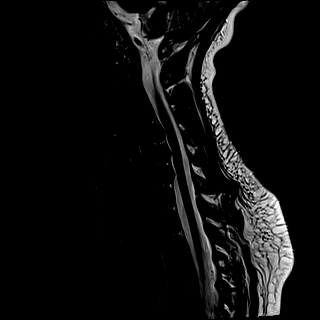
[im 12/15]
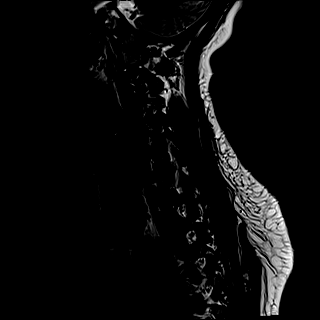
[im 15/15]
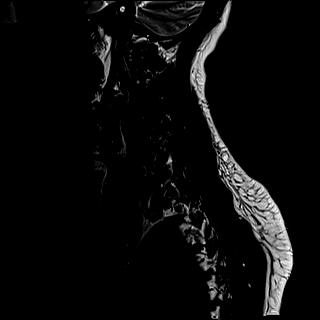

[Series 7: STIR · sagittal · 3.0mm · 0.86mm/px · 6 of 15 slices shown]
[im 1/15]
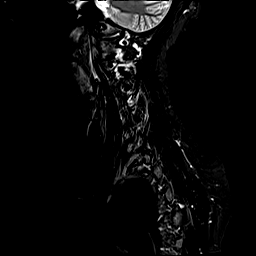
[im 3/15]
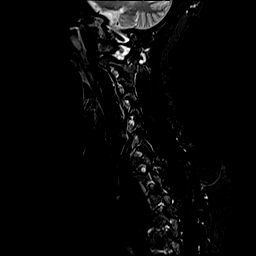
[im 6/15]
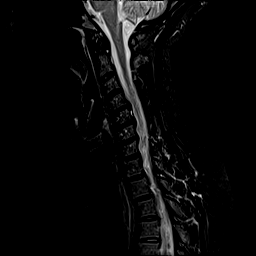
[im 9/15]
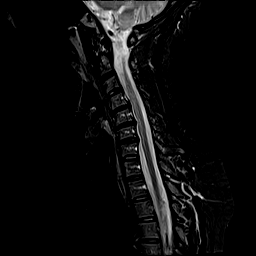
[im 12/15]
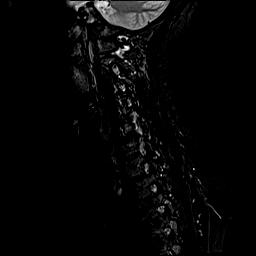
[im 15/15]
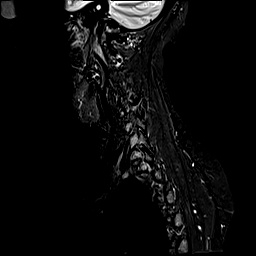

[Series 8: T2 · axial · 3.0mm · 0.70mm/px · z∈[-51,+36]mm · 8 of 28 slices shown (2 of 2)]
[im 1/28]
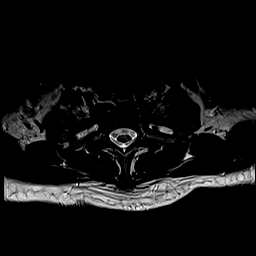
[im 4/28]
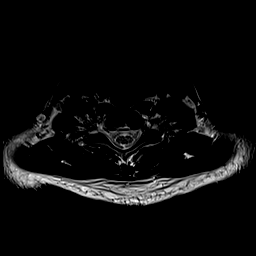
[im 10/28]
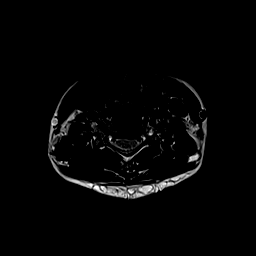
[im 13/28]
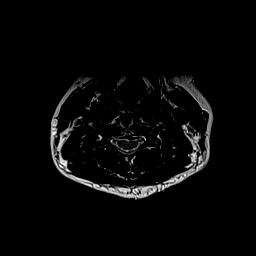
[im 16/28]
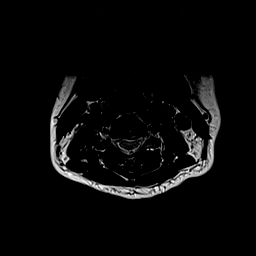
[im 19/28]
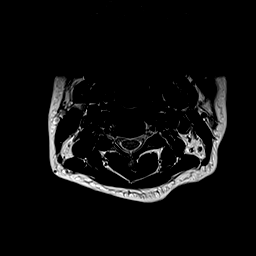
[im 25/28]
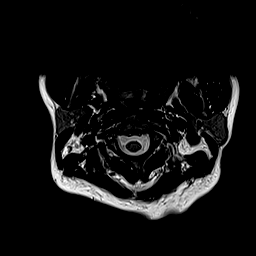
[im 28/28]
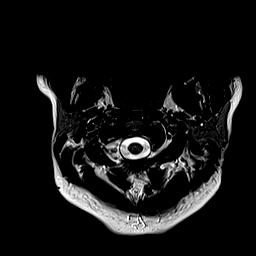

[Series 9: GRE · axial · 3.0mm · 0.35mm/px · z∈[-51,-41]mm · 2 of 28 slices shown]
[im 1/28]
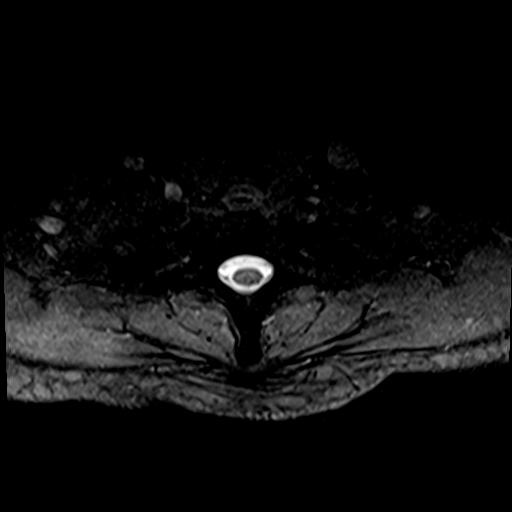
[im 4/28]
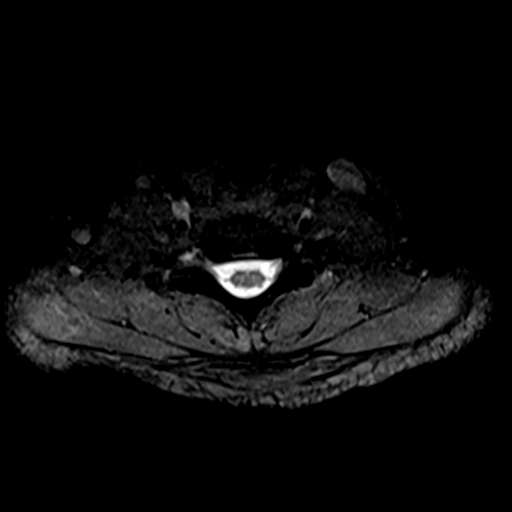

[Series 10: T1 · axial · 3.0mm · 0.35mm/px · z∈[-51,+36]mm · 8 of 28 slices shown (2 of 2)]
[im 1/28]
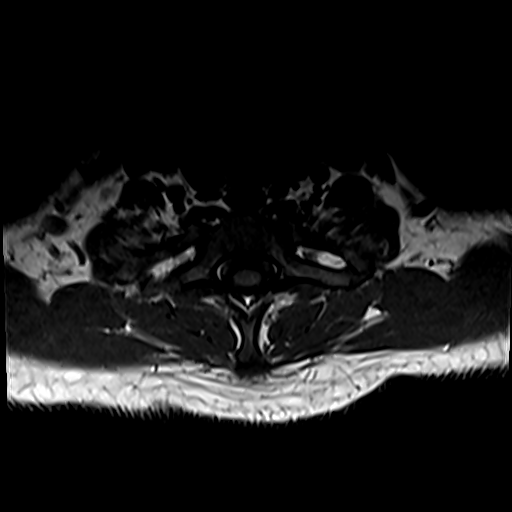
[im 4/28]
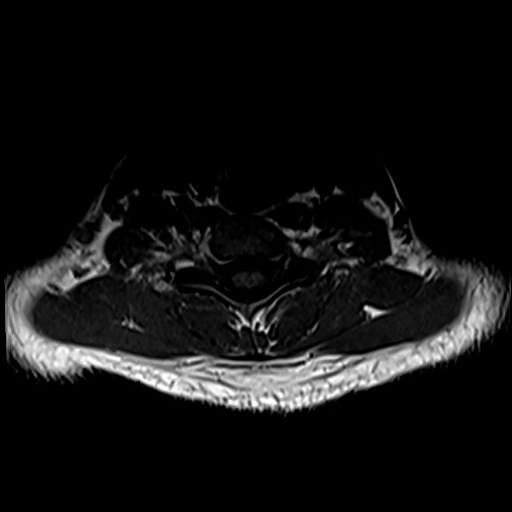
[im 10/28]
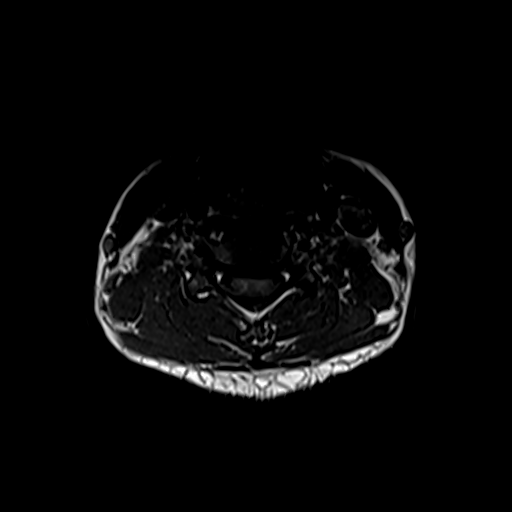
[im 13/28]
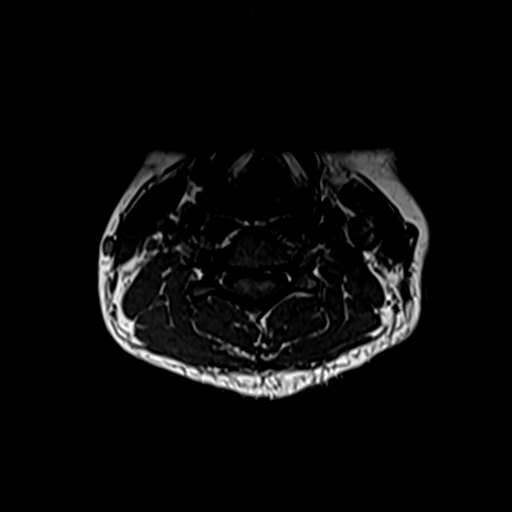
[im 16/28]
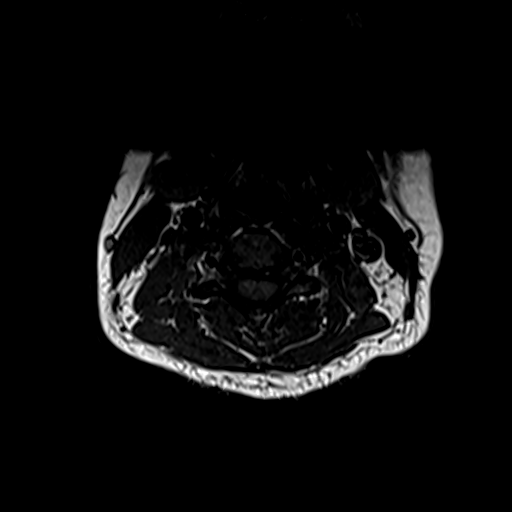
[im 19/28]
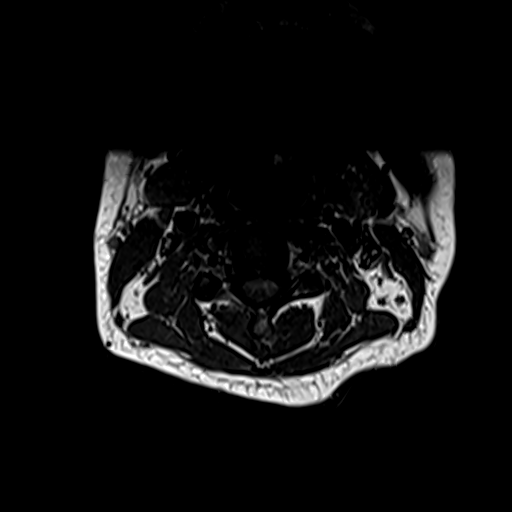
[im 25/28]
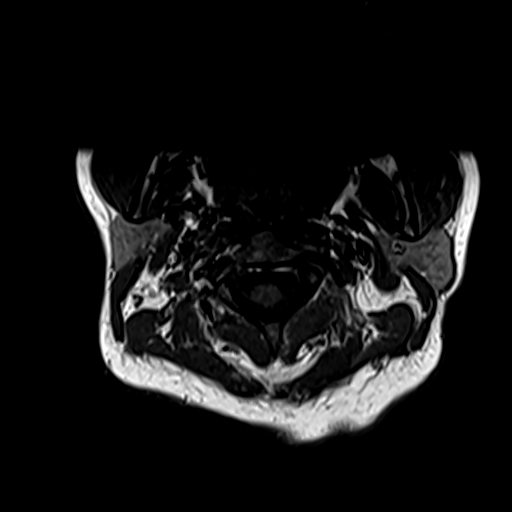
[im 28/28]
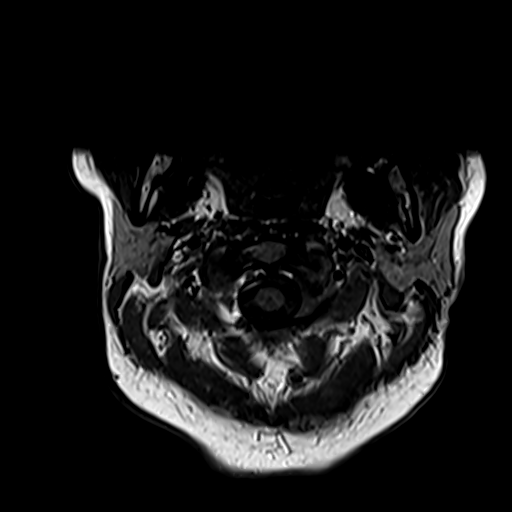

[36 of 48 positions shown; findings below may reference images not displayed]

FINDINGS: Alignment: Reversal of cervical lordosis.

Vertebrae: Diffusely low marrow signal, likely physiologic in this
patient who reportedly recently completed chemotherapy.

Cord: Normal signal and morphology.

Posterior Fossa, vertebral arteries, paraspinal tissues: Negative

Disc levels:

C2-3: Unremarkable.

C3-4: Tiny central protrusion

C4-5: Disc narrowing and foraminal predominant bulging with small
uncovertebral spurs. Small central protrusion.

C5-6: Disc narrowing and bulging eccentric to the left where there
is greater uncovertebral ridging. Mild left foraminal stenosis.

C6-7: Disc narrowing and bulging with right foraminal protrusion
suggested on sagittal images, but no neural compression.

C7-T1:Early facet spurring.  No neural impingement
IMPRESSION: 1. No impingement, inflammation, or mass to explain symptoms.
2. Disc bulging and ridging at C4-5 to C6-7.

## 2021-03-31 NOTE — Telephone Encounter (Signed)
I contacted Sharon Bennett regarding the cervical spine MRI result from today.  There is no explanation on the MRI for the symptoms she is experiencing.  She noted improvement in the pain with hydrocodone.  She will contact the office on 04/03/2021 with an update.

## 2021-04-01 LAB — CULTURE, BLOOD (SINGLE)
Culture: NO GROWTH
Special Requests: ADEQUATE

## 2021-04-03 ENCOUNTER — Encounter: Payer: Self-pay | Admitting: *Deleted

## 2021-04-03 ENCOUNTER — Telehealth: Payer: Self-pay

## 2021-04-03 ENCOUNTER — Telehealth: Payer: Self-pay | Admitting: Radiology

## 2021-04-03 DIAGNOSIS — C20 Malignant neoplasm of rectum: Secondary | ICD-10-CM

## 2021-04-03 NOTE — Telephone Encounter (Signed)
41 y.o. female history of rectal cancer s/p RIJ 8.5.22. Patient having right shoulder pain of unknown etiology. Confirmed with [name] who confirms that the patient has completed chemotherapy. Case discussed between [name] and Dr Kathlene Cote (IR Attending). Per [name] port related DVT has improved.    Patient scheduled for 10:30 am on 2.28.23. Patient instructed to come to [facility] at 8:30 am. Patient instructed not to eat after midnight and to bring a driver.

## 2021-04-03 NOTE — Progress Notes (Deleted)
Sharon Bennett, NPPatient ID: [name], female   DOB: [birth year], 41 y.o.   MRN: [id number]  41 y.o. female history of rectal cancer s/p RIJ 8.5.22. Patient having right shoulder pain of unknown etiology. Confirmed with [name] who confirms that the patient has completed chemotherapy. Case discussed between [name] and Dr Kathlene Cote (IR Attending). Per [name] port related DVT has improved.   Patient scheduled for 10:30 am on 2.28.23. Patient instructed to come to [facility] at 8:30 am. Patient instructed not to eat after midnight and to bring a driver.

## 2021-04-03 NOTE — Telephone Encounter (Addendum)
Pt left V/M message stating she is still in pain and would like to discuss port removal. Dr [name] and Leander Rams NP notified. Referral put in for port removal.Pt informed orders were placed.

## 2021-04-03 NOTE — Progress Notes (Signed)
Faxed 03/30/21 office note and CT angio chest of 03/20/21 to [facility] at [phone] per their request.  Claim #[id number]

## 2021-04-04 ENCOUNTER — Ambulatory Visit (HOSPITAL_COMMUNITY)
Admission: RE | Admit: 2021-04-04 | Discharge: 2021-04-04 | Disposition: A | Discharge status: Home / Self Care | Payer: 59 | Source: Ambulatory Visit | Attending: Oncology | Admitting: Oncology

## 2021-04-04 ENCOUNTER — Other Ambulatory Visit: Payer: Self-pay | Admitting: Radiology

## 2021-04-04 ENCOUNTER — Other Ambulatory Visit: Payer: Self-pay

## 2021-04-04 ENCOUNTER — Encounter (HOSPITAL_COMMUNITY): Payer: Self-pay

## 2021-04-04 DIAGNOSIS — Z452 Encounter for adjustment and management of vascular access device: Secondary | ICD-10-CM | POA: Diagnosis not present

## 2021-04-04 DIAGNOSIS — D259 Leiomyoma of uterus, unspecified: Secondary | ICD-10-CM | POA: Insufficient documentation

## 2021-04-04 DIAGNOSIS — Z7901 Long term (current) use of anticoagulants: Secondary | ICD-10-CM | POA: Diagnosis not present

## 2021-04-04 DIAGNOSIS — C20 Malignant neoplasm of rectum: Secondary | ICD-10-CM

## 2021-04-04 DIAGNOSIS — Z85048 Personal history of other malignant neoplasm of rectum, rectosigmoid junction, and anus: Secondary | ICD-10-CM | POA: Diagnosis not present

## 2021-04-04 DIAGNOSIS — L910 Hypertrophic scar: Secondary | ICD-10-CM | POA: Diagnosis not present

## 2021-04-04 DIAGNOSIS — Z86718 Personal history of other venous thrombosis and embolism: Secondary | ICD-10-CM | POA: Diagnosis not present

## 2021-04-04 DIAGNOSIS — Z9221 Personal history of antineoplastic chemotherapy: Secondary | ICD-10-CM | POA: Insufficient documentation

## 2021-04-04 DIAGNOSIS — Z923 Personal history of irradiation: Secondary | ICD-10-CM | POA: Insufficient documentation

## 2021-04-04 DIAGNOSIS — M79601 Pain in right arm: Secondary | ICD-10-CM | POA: Diagnosis not present

## 2021-04-04 DIAGNOSIS — M25511 Pain in right shoulder: Secondary | ICD-10-CM | POA: Diagnosis not present

## 2021-04-04 DIAGNOSIS — R202 Paresthesia of skin: Secondary | ICD-10-CM | POA: Insufficient documentation

## 2021-04-04 HISTORY — PX: IR REMOVAL TUN ACCESS W/ PORT W/O FL MOD SED: IMG2290

## 2021-04-04 MED ORDER — SODIUM CHLORIDE 0.9 % IV SOLN: INTRAVENOUS | Status: DC

## 2021-04-04 MED ORDER — ONDANSETRON HCL 4 MG/2ML IJ SOLN
INTRAMUSCULAR | Status: AC
  Administered 2021-04-04: 4 mg via INTRAVENOUS
  Filled 2021-04-04: qty 2

## 2021-04-04 MED ORDER — FENTANYL CITRATE (PF) 100 MCG/2ML IJ SOLN
INTRAMUSCULAR | Status: AC | PRN
  Administered 2021-04-04: 25 ug via INTRAVENOUS
  Administered 2021-04-04: 50 ug via INTRAVENOUS

## 2021-04-04 MED ORDER — FENTANYL CITRATE (PF) 100 MCG/2ML IJ SOLN
INTRAMUSCULAR | Status: AC
  Filled 2021-04-04: qty 2

## 2021-04-04 MED ORDER — LIDOCAINE-EPINEPHRINE 1 %-1:100000 IJ SOLN
INTRAMUSCULAR | Status: AC | PRN
  Administered 2021-04-04: 10 mL

## 2021-04-04 MED ORDER — MIDAZOLAM HCL 2 MG/2ML IJ SOLN
INTRAMUSCULAR | Status: AC
Start: 2021-04-04 — End: 2021-04-04
  Filled 2021-04-04: qty 4

## 2021-04-04 MED ORDER — MIDAZOLAM HCL 2 MG/2ML IJ SOLN
INTRAMUSCULAR | Status: AC | PRN
  Administered 2021-04-04: 1 mg via INTRAVENOUS

## 2021-04-04 MED ORDER — LIDOCAINE-EPINEPHRINE 1 %-1:100000 IJ SOLN
INTRAMUSCULAR | Status: AC
  Filled 2021-04-04: qty 1

## 2021-04-04 MED ORDER — ONDANSETRON HCL 4 MG/2ML IJ SOLN
4.0000 mg | Freq: Once | INTRAMUSCULAR | Status: AC
Start: 2021-04-04 — End: 2021-04-04

## 2021-04-04 NOTE — Discharge Instructions (Addendum)
For questions /concerns may call Interventional Radiology at [phone]  You may remove your dressing and shower tomorrow afternoon      Implanted Port Removal, Care After This sheet gives you information about how to care for yourself after your procedure. Your health care provider may also give you more specific instructions. If you have problems or questions, contact your health careprovider. What can I expect after the procedure? After the procedure, it is common to have: Soreness or pain near your incision. Some swelling or bruising near your incision. Follow these instructions at home: Medicines Take over-the-counter and prescription medicines only as told by your health care provider. If you were prescribed an antibiotic medicine, take it as told by your health care provider. Do not stop taking the antibiotic even if you start to feel better. Bathing Do not take baths, swim, or use a hot tub until your health care provider approves. Ask your health care provider if you can take showers. You may only be allowed to take sponge baths. Incision care Follow instructions from your health care provider about how to take care of your incision. Make sure you: Wash your hands with soap and water before you change your bandage (dressing). If soap and water are not available, use hand sanitizer. Change your dressing as told by your health care provider. Keep your dressing dry. Leave  skin glue, or adhesive strips in place. These skin closures may need to stay in place for 2 weeks or longer. If adhesive strip edges start to loosen and curl up, you may trim the loose edges.  Check your incision area every day for signs of infection. Check for:       - More redness, swelling, or pain.       - More fluid or blood.       - Warmth.       - Pus or a bad smell.  Driving Do not drive for 24 hours if you were given a medicine to help you relax (sedative) during your procedure. If you did not  receive a sedative, ask your health care provider when it is safe to drive.  Activity Return to your normal activities as told by your health care provider. Ask your health care provider what activities are safe for you. Do not lift anything that is heavier than 10 lb (4.5 kg), or the limit that you are told, until your health care provider says that it is safe. Do not do activities that involve lifting your arms over your head. General instructions Do not use any products that contain nicotine or tobacco, such as cigarettes and e-cigarettes. These can delay healing. If you need help quitting, ask your health care provider. Keep all follow-up visits as told by your health care provider. This is important. Contact a health care provider if: You have more redness, swelling, or pain around your incision. You have more fluid or blood coming from your incision. Your incision feels warm to the touch. You have pus or a bad smell coming from your incision. You have pain that is not relieved by your pain medicine. Get help right away if you have: A fever or chills. Chest pain. Difficulty breathing. Summary After the procedure, it is common to have pain, soreness, swelling, or bruising near your incision. If you were prescribed an antibiotic medicine, take it as told by your health care provider. Do not stop taking the antibiotic even if you start to feel better. Do not drive for  24 hours if you were given a sedative during your procedure. Return to your normal activities as told by your health care provider. Ask your health care provider what activities are safe for you. This information is not intended to replace advice given to you by your health care provider. Make sure you discuss any questions you have with your healthcare provider.  Moderate Conscious Sedation, Adult, Care After This sheet gives you information about how to care for yourself after your procedure. Your health care provider may  also give you more specific instructions. If you have problems or questions, contact your health careprovider. What can I expect after the procedure? After the procedure, it is common to have: Sleepiness for several hours. Impaired judgment for several hours. Difficulty with balance. Vomiting if you eat too soon. Follow these instructions at home: For the time period you were told by your health care provider: Rest. Do not participate in activities where you could fall or become injured. Do not drive or use machinery. Do not drink alcohol. Do not take sleeping pills or medicines that cause drowsiness. Do not make important decisions or sign legal documents. Do not take care of children on your own. Eating and drinking  Follow the diet recommended by your health care provider. Drink enough fluid to keep your urine pale yellow. If you vomit: Drink water, juice, or soup when you can drink without vomiting. Make sure you have little or no nausea before eating solid foods.  General instructions Take over-the-counter and prescription medicines only as told by your health care provider. Have a responsible adult stay with you for the time you are told. It is important to have someone help care for you until you are awake and alert. Do not smoke. Keep all follow-up visits as told by your health care provider. This is important. Contact a health care provider if: You are still sleepy or having trouble with balance after 24 hours. You feel light-headed. You keep feeling nauseous or you keep vomiting. You develop a rash. You have a fever. You have redness or swelling around the IV site. Get help right away if: You have trouble breathing. You have new-onset confusion at home. Summary After the procedure, it is common to feel sleepy, have impaired judgment, or feel nauseous if you eat too soon. Rest after you get home. Know the things you should not do after the procedure. Follow the diet  recommended by your health care provider and drink enough fluid to keep your urine pale yellow. Get help right away if you have trouble breathing or new-onset confusion at home. This information is not intended to replace advice given to you by your health care provider. Make sure you discuss any questions you have with your healthcare provider. Document Revised: 05/22/2019 Document Reviewed: 12/18/2018 Elsevier Patient Education  2022 [employer].

## 2021-04-04 NOTE — Procedures (Signed)
Vascular and Interventional Radiology Procedure Note  Patient: Sharon Bennett DOB: [birth year] Medical Record Number: [id number] Note Date/Time: 04/04/21 11:03 AM   Performing Physician: Michaelle Birks, MD Assistant(s): None  Diagnosis:  Colorectal CA. Completion of Rx.  Procedure: PORT REMOVAL  Anesthesia: Conscious Sedation Complications: None Estimated Blood Loss: Minimal Specimens:  None  Findings:  Successful removal of a right-sided venous port. Primary incision closure. Dermabond at skin.  See detailed procedure note with images in PACS. The patient tolerated the procedure well without incident or complication and was returned to Recovery in stable condition.    Michaelle Birks, MD [facility] Radiology   Pager. [place]

## 2021-04-04 NOTE — H&P (Signed)
Referring Physician(s): Ladell Pier  Supervising Physician: Michaelle Birks  Patient Status:  [facility] OP  Chief Complaint: "I'm getting my port out"   Subjective: Patient known to IR service from Port-A-Cath placement on 09/09/20.  She has a history of rectal cancer diagnosed in July 2022.  She has undergone chemotherapy and radiation. She is scheduled for surgery on 05/10/2021.  Past medical history also significant for uterine fibroids, right IJ DVT diagnosed in January of this year currently on Eliquis.  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 Right innominate vein has complete resolution.  Patient also reports persistent right shoulder/arm discomfort, right arm "tightness"  as well as occasional paresthesias involving right hand.  She denies fever, headache, chest pain, dyspnea, cough, abdominal/back pain, nausea, vomiting or bleeding.  She presents today for Port-A-Cath removal.  Past Medical History:  Diagnosis Date   Colorectal cancer ([place])    Family history of brain cancer    Family history of breast cancer    Family history of pancreatic cancer    Past Surgical History:  Procedure Laterality Date   IR IMAGING GUIDED PORT INSERTION  09/09/2020   LEEP     2015      Allergies: Codeine, Dilaudid [hydromorphone], and Tegaderm ag mesh [silver]  Medications: Prior to Admission medications   Medication Sig Start Date End Date Taking? Authorizing Provider  acetaminophen (TYLENOL) 500 MG tablet Take 1,000 mg by mouth every 6 (six) hours as needed for mild pain or headache.   Yes [provider]  cyclobenzaprine (FLEXERIL) 10 MG tablet Take 1 tablet (10 mg total) by mouth 2 (two) times daily as needed for muscle spasms. 03/20/21  Yes Curatolo, Adam, DO  HYDROcodone-acetaminophen (NORCO) 5-325 MG tablet Take 1 tablet by mouth every 6  (six) hours as needed for moderate pain. 03/30/21  Yes Owens Shark, NP  lidocaine (LIDODERM) 5 % Place 1 patch onto the skin daily. Remove and discard patch within 12 hours or as directed by MD 03/27/21  Yes Nuala Alpha A, PA-C  melatonin 5 MG TABS Take 5 mg by mouth at bedtime as needed (sleep).   Yes [provider]  SLYND 4 MG TABS Take 1 tablet by mouth daily. 11/28/20  Yes [provider]  apixaban (ELIQUIS) 5 MG TABS tablet Take 1 tablet (5 mg total) by mouth 2 (two) times daily. 02/09/21   Owens Shark, NP  apixaban (ELIQUIS) 5 MG TABS tablet Take 1 tablet (5 mg total) by mouth 2 (two) times daily. 02/09/21   Owens Shark, NP  aspirin-acetaminophen-caffeine (EXCEDRIN MIGRAINE) [phone] MG tablet Take 1 tablet by mouth every 6 (six) hours as needed for headache.    [provider]  fluconazole (DIFLUCAN) 100 MG tablet Take 1 tablet (100 mg total) by mouth daily. 01/05/21   Ladell Pier, MD  lidocaine (LIDODERM) 5 % Place 1 patch onto the skin daily. Remove & Discard patch within 12 hours or as directed by MD 03/27/21   Nuala Alpha A, PA-C  lidocaine-prilocaine (EMLA) cream Apply 1/2 tablespoon to port site and cover with plastic wrap 2 hours prior to stick to numb site Patient not taking: Reported on 03/09/2021 09/23/20   Ladell Pier, MD  magic mouthwash SOLN Take 5 mLs by mouth 4 (four) times daily as needed for mouth pain. Patient not taking: Reported on 03/09/2021 01/05/21   [name],  Izola Price, MD  methylPREDNISolone (MEDROL DOSEPAK) 4 MG TBPK tablet Take as directed per package 03/20/21   Curatolo, Adam, DO  ondansetron (ZOFRAN) 8 MG tablet Take 1 tablet (8 mg total) by mouth every 8 (eight) hours as needed for nausea or vomiting. 09/12/20   Ladell Pier, MD  pantoprazole (PROTONIX) 40 MG tablet TAKE 1 TABLET BY MOUTH EVERY DAY Patient not taking: Reported on 03/17/2021 10/27/20   Ladell Pier, MD  potassium chloride SA (KLOR-CON M) 20 MEQ tablet  Take 1 tablet (20 mEq total) by mouth daily. Patient taking differently: Take 20 mEq by mouth 2 (two) times daily. Twice daily as of 02/23/21 02/10/21   Owens Shark, NP  prochlorperazine (COMPAZINE) 10 MG tablet Take 1 tablet (10 mg total) by mouth every 6 (six) hours as needed for nausea or vomiting. Patient not taking: Reported on 03/17/2021 09/12/20   Ladell Pier, MD     Vital Signs:pending    Physical Exam awake, alert.  Chest clear to auscultation bilaterally.  Clean, intact right chest wall Port-A-Cath.  Keloid noted at site.  Heart with regular rate and rhythm.  Abdomen soft, positive bowel sounds, nontender.  No lower extremity edema.  Imaging: MR Cervical Spine Wo Contrast  Result Date: 03/31/2021 CLINICAL DATA:  History of rectal cancer. Right upper back and arm pain with radicular nature, beginning 2 weeks ago. EXAM: MRI CERVICAL SPINE WITHOUT CONTRAST TECHNIQUE: Multiplanar, multisequence MR imaging of the cervical spine was performed. No intravenous contrast was administered. COMPARISON:  None. FINDINGS: Alignment: Reversal of cervical lordosis. Vertebrae: Diffusely low marrow signal, likely physiologic in this patient who reportedly recently completed chemotherapy. Cord: Normal signal and morphology. Posterior Fossa, vertebral arteries, paraspinal tissues: Negative Disc levels: C2-3: Unremarkable. C3-4: Tiny central protrusion C4-5: Disc narrowing and foraminal predominant bulging with small uncovertebral spurs. Small central protrusion. C5-6: Disc narrowing and bulging eccentric to the left where there is greater uncovertebral ridging. Mild left foraminal stenosis. C6-7: Disc narrowing and bulging with right foraminal protrusion suggested on sagittal images, but no neural compression. C7-T1:Early facet spurring.  No neural impingement IMPRESSION: 1. No impingement, inflammation, or mass to explain symptoms. 2. Disc bulging and ridging at C4-5 to C6-7. Electronically Signed   By:  Jorje Guild M.D.   On: 03/31/2021 10:42    Labs:  CBC: Recent Labs    02/23/21 0844 03/09/21 1300 03/20/21 1549 03/27/21 1010  WBC 2.8* 4.1 4.8 4.2  HGB 10.8* 11.5* 11.7* 11.8*  HCT 33.6* 36.0 37.0 36.7  PLT 220 214 244 237    COAGS: No results for input(s): INR, APTT in the last 8760 hours.  BMP: Recent Labs    02/23/21 0844 03/09/21 1300 03/20/21 1549 03/27/21 1010  NA 138 139 139 137  K 3.3* 3.6 3.5 3.7  CL 104 105 104 103  CO2 25 25 28 24   GLUCOSE 83 122* 85 90  BUN 6 7 11 15   CALCIUM 9.0 9.1 9.3 9.3  CREATININE 0.58 0.61 0.88 0.57  GFRNONAA >60 >60 >60 >60    LIVER FUNCTION TESTS: Recent Labs    02/09/21 0822 02/10/21 2155 02/23/21 0844 03/09/21 1300  BILITOT 0.4 0.6 0.6 0.5  AST 26 22 25  38  ALT 45* 37 42 62*  ALKPHOS 66 67 66 72  PROT 7.3 7.3 7.3 7.7  ALBUMIN 4.2 4.1 4.2 4.3    Assessment and Plan: Patient known to IR service from Port-A-Cath placement on 09/09/20.  She has a history  of rectal cancer diagnosed in July 2022.  She has undergone chemotherapy and radiation. She is scheduled for surgery on 05/10/2021.  Past medical history also significant for uterine fibroids, right IJ DVT diagnosed in January of this year currently on Eliquis.  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 Right innominate vein has complete resolution.  Patient also reports persistent right shoulder/arm discomfort, right arm "tightness"  as well as occasional paresthesias involving right hand.  MRI C-spine on 2/24 revealed no impingement, inflammation or mass to explain symptoms.  There was disc bulging and ridging at C4-5 to C6-7.  She denies fever, headache, chest pain, dyspnea, cough, abdominal/back pain, nausea, vomiting or bleeding.  She presents today for Port-A-Cath removal.  Details/risks of procedure, including but not limited to,  internal bleeding, infection, injury to adjacent structures discussed with patient with her understanding and consent.   Electronically Signed: D. Rowe Robert, PA-C 04/04/2021, 9:23 AM   I spent a total of 20 minutes at the the patient's bedside AND on the patient's hospital floor or unit, greater than 50% of which was counseling/coordinating care for Port-A-Cath removal

## 2021-04-05 DIAGNOSIS — C20 Malignant neoplasm of rectum: Secondary | ICD-10-CM | POA: Diagnosis not present

## 2021-04-06 ENCOUNTER — Other Ambulatory Visit: Payer: Self-pay | Admitting: Surgery

## 2021-04-06 DIAGNOSIS — C20 Malignant neoplasm of rectum: Secondary | ICD-10-CM

## 2021-04-07 ENCOUNTER — Inpatient Hospital Stay: Payer: 59

## 2021-04-07 ENCOUNTER — Ambulatory Visit
Admission: RE | Admit: 2021-04-07 | Discharge: 2021-04-07 | Disposition: A | Discharge status: Home / Self Care | Payer: 59 | Source: Ambulatory Visit | Attending: Surgery | Admitting: Surgery

## 2021-04-07 ENCOUNTER — Inpatient Hospital Stay: Payer: 59 | Attending: Oncology | Admitting: Oncology

## 2021-04-07 ENCOUNTER — Inpatient Hospital Stay: Payer: 59 | Admitting: Oncology

## 2021-04-07 ENCOUNTER — Other Ambulatory Visit: Payer: Self-pay

## 2021-04-07 DIAGNOSIS — M79601 Pain in right arm: Secondary | ICD-10-CM | POA: Insufficient documentation

## 2021-04-07 DIAGNOSIS — C20 Malignant neoplasm of rectum: Secondary | ICD-10-CM

## 2021-04-07 DIAGNOSIS — N200 Calculus of kidney: Secondary | ICD-10-CM | POA: Diagnosis not present

## 2021-04-07 DIAGNOSIS — R0789 Other chest pain: Secondary | ICD-10-CM | POA: Insufficient documentation

## 2021-04-07 DIAGNOSIS — D259 Leiomyoma of uterus, unspecified: Secondary | ICD-10-CM | POA: Diagnosis not present

## 2021-04-07 DIAGNOSIS — S20211A Contusion of right front wall of thorax, initial encounter: Secondary | ICD-10-CM | POA: Diagnosis not present

## 2021-04-07 DIAGNOSIS — I82C11 Acute embolism and thrombosis of right internal jugular vein: Secondary | ICD-10-CM | POA: Insufficient documentation

## 2021-04-07 DIAGNOSIS — K6289 Other specified diseases of anus and rectum: Secondary | ICD-10-CM | POA: Diagnosis not present

## 2021-04-07 IMAGING — CT CT CHEST-ABD-PELV W/ CM
1 of 3 series · 14 of 32 positions shown, 18 images · IV contrast (APPLIED)
Comparison: CT chest, [DATE], CT abdomen pelvis, [DATE]

CLINICAL DATA: Rectal cancer restaging, status post chemotherapy
and radiation recent port removal

EXAM:
CT CHEST, ABDOMEN, AND PELVIS WITH CONTRAST
TECHNIQUE: Multidetector CT imaging of the chest, abdomen and pelvis was
performed following the standard protocol during bolus
administration of intravenous contrast.

[Series 5: super d · axial · 0.70mm/px · z∈[-278,+18]mm · 14 of 408 slices shown, 18 images]
[im 19/408  soft-tissue]
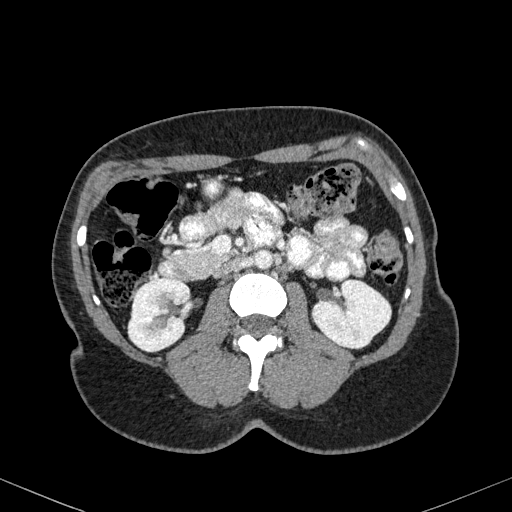
[im 19/408  bone]
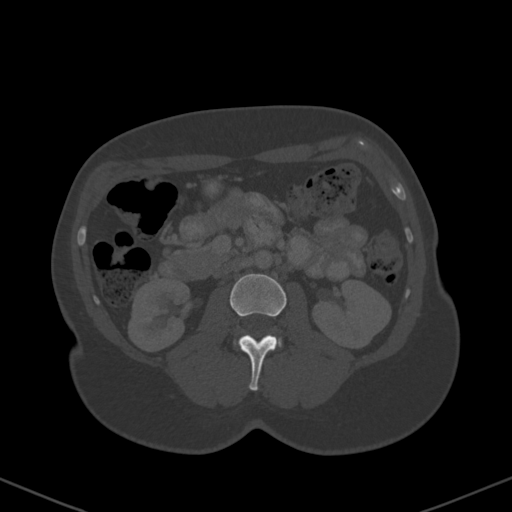
[im 56/408  soft-tissue]
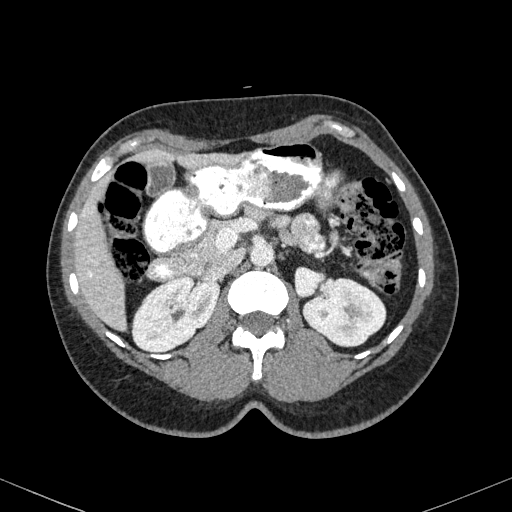
[im 93/408  soft-tissue]
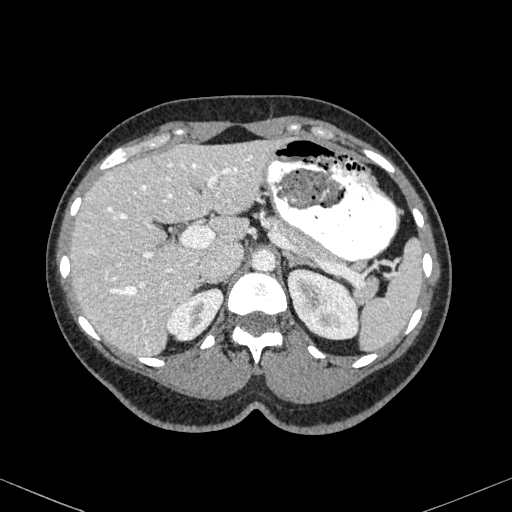
[im 130/408  soft-tissue]
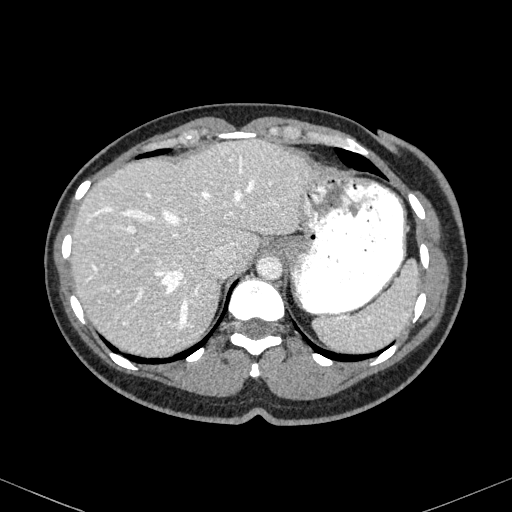
[im 149/408  soft-tissue]
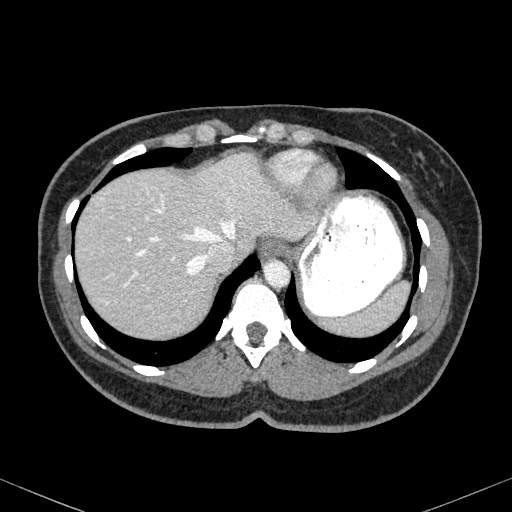
[im 186/408  soft-tissue]
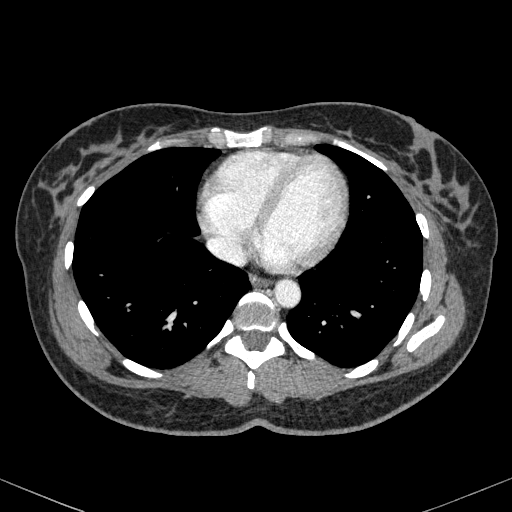
[im 223/408  soft-tissue]
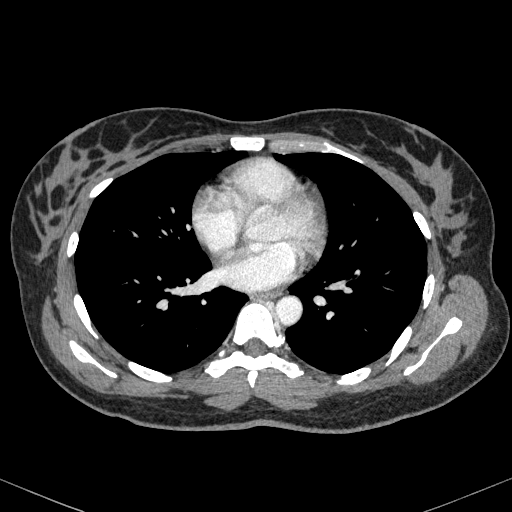
[im 260/408  soft-tissue]
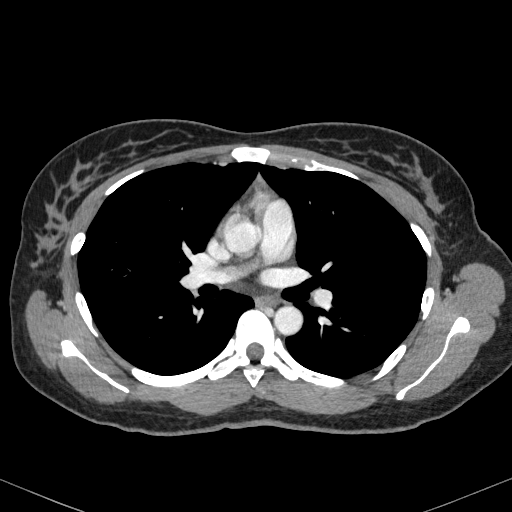
[im 278/408  soft-tissue]
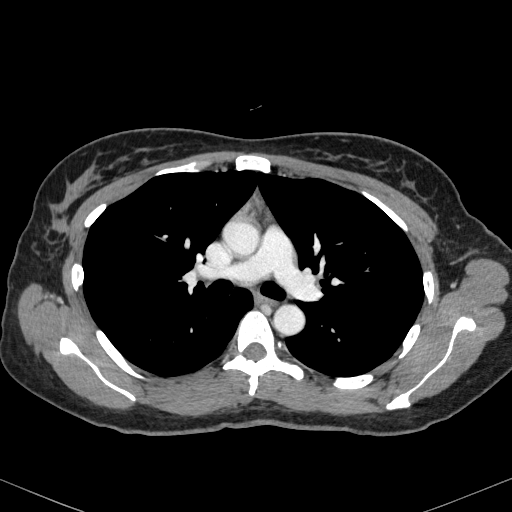
[im 278/408  bone]
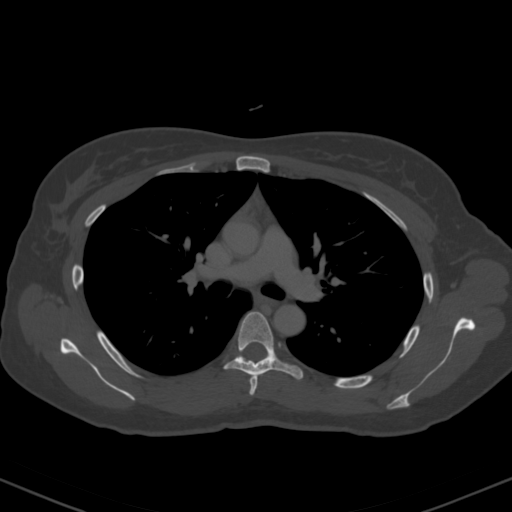
[im 315/408  soft-tissue]
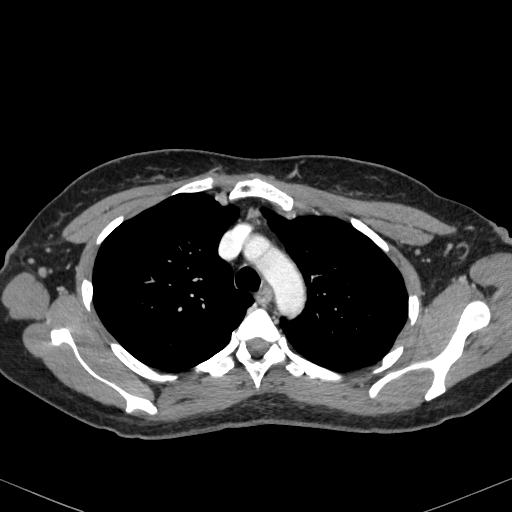
[im 334/408  lung]
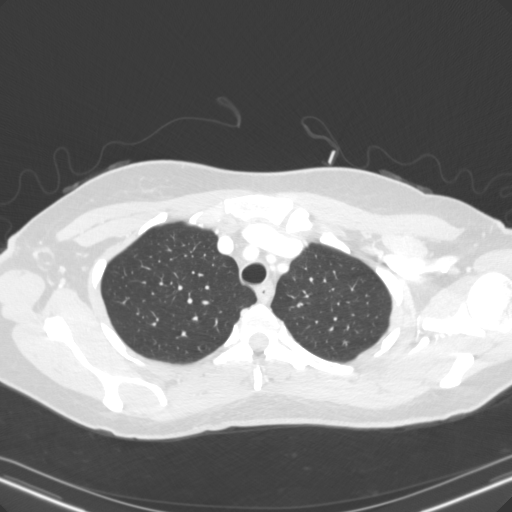
[im 352/408  soft-tissue]
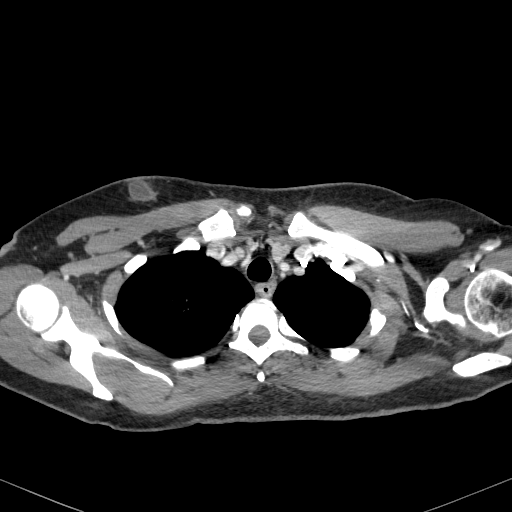
[im 352/408  lung]
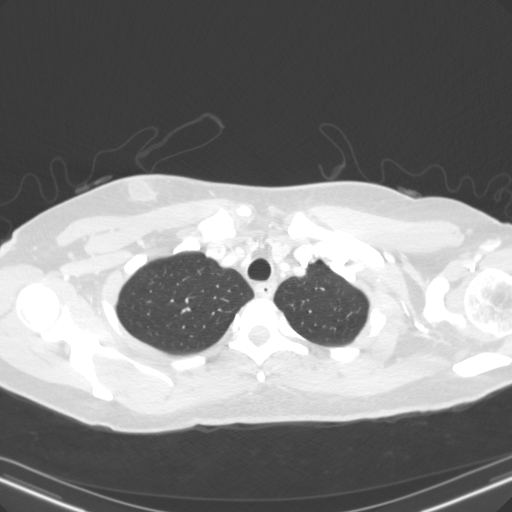
[im 371/408  lung]
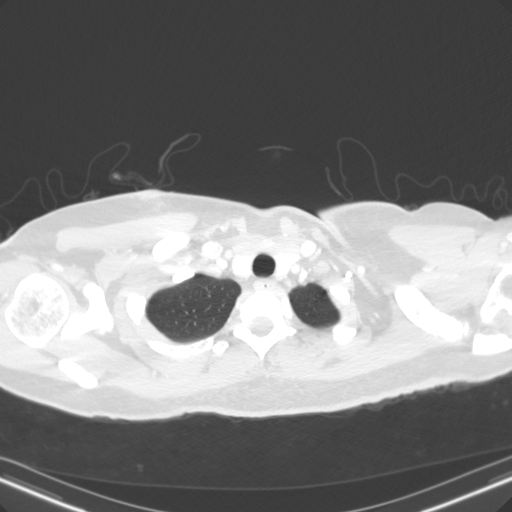
[im 389/408  soft-tissue]
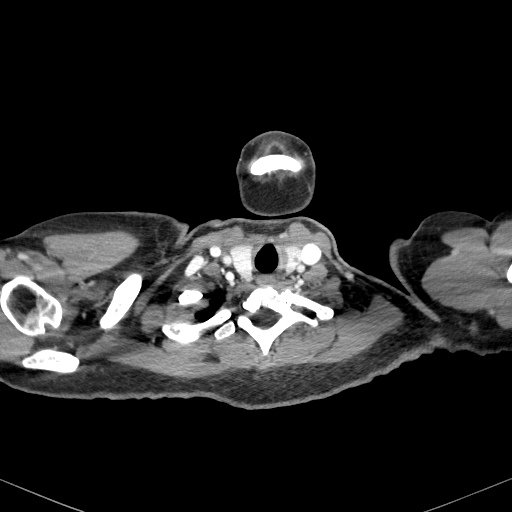
[im 389/408  lung]
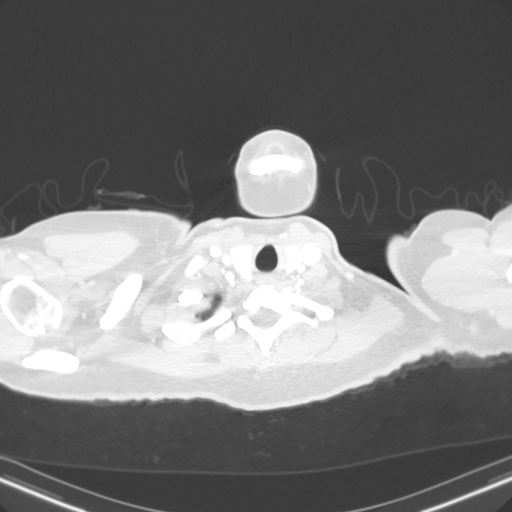

[14 of 32 positions shown; findings below may reference images not displayed]

RADIATION DOSE REDUCTION: This exam was performed according to the
departmental dose-optimization program which includes automated
exposure control, adjustment of the mA and/or kV according to
patient size and/or use of iterative reconstruction technique.

CONTRAST:  100mL [FI] IOPAMIDOL ([FI]) INJECTION 61%,
additional oral enteric contrast
FINDINGS: CT CHEST FINDINGS

Cardiovascular: Small hematoma of the anterior right chest at the
site of recent port catheter removal (series 2, image 10). Normal
heart size. No pericardial effusion.

Mediastinum/Nodes: No enlarged mediastinal, hilar, or axillary lymph
nodes. Thymic remnant in the anterior mediastinum. Thyroid gland,
trachea, and esophagus demonstrate no significant findings.

Lungs/Pleura: Lungs are clear. No pleural effusion or pneumothorax.

Musculoskeletal: No chest wall mass or suspicious osseous lesions
identified.

CT ABDOMEN PELVIS FINDINGS

Hepatobiliary: No solid liver abnormality is seen. No gallstones,
gallbladder wall thickening, or biliary dilatation.

Pancreas: Unremarkable. No pancreatic ductal dilatation or
surrounding inflammatory changes.

Spleen: Normal in size without significant abnormality.

Adrenals/Urinary Tract: Adrenal glands are unremarkable. Punctuate
nonobstructive calculus of the inferior pole of the right kidney
(series 3, image 88). No left-sided calculi, ureteral calculi, or
hydronephrosis. Bladder is unremarkable.

Stomach/Bowel: Stomach is within normal limits. Appendix appears
normal. Interval decrease in nearly circumferential wall thickening
of the low rectum, maximum thickness now approximately 0.8 cm,
previously 1.7 cm (series 2, image 99).

Vascular/Lymphatic: No significant vascular findings are present. No
enlarged abdominal or pelvic lymph nodes.

Reproductive: Uterine fibroids.

Other: No abdominal wall hernia or abnormality. No ascites.

Musculoskeletal: No acute osseous findings.
IMPRESSION: 1. Interval decrease in nearly circumferential wall thickening of
the low rectum, consistent with treatment response. Pelvic MR is the
test of choice for the local staging of rectal cancer.
2. No evidence of lymphadenopathy or metastatic disease in the
chest, abdomen, or pelvis.
3. Small hematoma of the anterior right chest at the site of recent
port catheter removal.
4. Nonobstructive right nephrolithiasis.
5. Uterine fibroids.

## 2021-04-07 MED ORDER — IOPAMIDOL (ISOVUE-300) INJECTION 61%
100.0000 mL | Freq: Once | INTRAVENOUS | Status: AC | PRN
  Administered 2021-04-07: 100 mL via INTRAVENOUS

## 2021-04-07 NOTE — Progress Notes (Signed)
?[place] ? ? ?HEMATOLOGY- ONCOLOGY TeleHEALTH OFFICE VISIT PROGRESS NOTE ? ?I connected with [name] on 04/07/21 at  2:40 PM EST by telephone and verified that I am speaking with the correct person using two identifiers. ?  ?I discussed the limitations, risks, security and privacy concerns of performing an evaluation and management service by telemedicine and the availability of in-person appointments. I also discussed with the patient that there may be a patient responsible charge related to this service. The patient expressed understanding and agreed to proceed. ? ? ?Patient's location: Home ?Provider's location: Office ? ? ? ?Diagnosis: Rectal cancer ? ?INTERVAL HISTORY:  ? ?[name] is seen today for a telehealth visit per her request.  She underwent a repeat upper extremity Doppler 03/30/2021.  This revealed partial recannulation of the right IJ vein.  No clot was seen in the inanimate, axillary, or brachial veins. ?She continued to have pain in the right upper extremity.  A cervical spine MRI 03/31/2021 revealed right foraminal protrusion and disc narrowing/bulging at C6-7 without neural compression.  No radiologic findings to explain the right arm pain. ? ?She underwent removal of the Port-A-Cath on 04/04/2021. ? ?She continues to have pain in the right arm.  She feels as though a "blood pressure cuff "is around the right arm.  She has mild numbness and tingling in the right greater than left hand.  She has increased numbness in the feet. ? ?She takes hydrocodone twice daily for relief of the arm pain.  The pain is worse in the mornings.  She is scheduled for rectal surgery 05/10/2021. ? ?No difficulty with bowel function.  No other complaint. ? ?Lab Results: ? ?Lab Results  ?Component Value Date  ? WBC 4.2 03/27/2021  ? HGB 11.8 (L) 03/27/2021  ? HCT 36.7 03/27/2021  ? MCV 88.4 03/27/2021  ? PLT 237 03/27/2021  ? NEUTROABS 2.7 03/27/2021  ? ? ?Imaging: ? ?No results  found. ? ?Medications: I have reviewed the patient's current medications. ? ?Assessment/Plan: ?Rectal cancer  ?CT abdomen/pelvis 08/21/2020-large rectal mass measuring 4.4 x 2.6 x 5.5 cm, haziness in the mesorectal fat, obscuration of the fat plane between the mass and the adjacent posterior aspect of the uterus, numerous prominent mesorectal lymph nodes measuring up to 5 mm ?Colonoscopy 08/23/2020-fungating infiltrative nonobstructing large mass found in the rectum.  Mass was noncircumferential, measured 5 cm in length, 3 cm in diameter and was 7 cm from the anal verge.  No bleeding was present.   ?Pathology showed invasive colonic adenocarcinoma, moderately differentiated.  No evidence of mismatch repair deficiency. ?Chest CT 09/01/2020-no evidence of metastatic disease ?MRI pelvis 09/02/2020-tumor at 10.4 cm from anal verge, 5.9 cm from the internal anal sphincter, T3, approximately 15 greater than 5 mm lymph nodes, N2b, multiple uterine fibroids ?Cycle 1 FOLFOX 09/15/2020 ?Cycle 2 FOLFOX 09/29/2020 ?Cycle 3 FOLFOX 10/13/2020 ?Cycle 4 FOLFOX 10/27/2020 ?Cycle 5 FOLFOX 11/10/2020 ?Cycle 6 FOLFOX 11/24/2020 ?Cycle 7 FOLFOX 12/08/2020 ?Cycle 8 FOLFOX 01/05/2021, Ziextenzo ?Radiation/Xeloda 01/23/2021-03/03/2021 ?2.   rectal bleeding, change in bowel habits secondary to #1 ?3.   Uterine fibroids noted on MRI pelvis 09/02/2020 ?4.   Port-A-Cath placement 09/09/2020, Interventional Radiology, Port-A-Cath removed 04/04/2021 ?5.  Admission 12/14/2020 with febrile neutropenia, E. coli bacteremia-completed an outpatient course of Augmentin ?6.  Right IJ DVT 02/09/2021-apixaban ?Right upper extremity Doppler 03/30/2021-improvement in right IJ thrombus, no other ?  ? ? ?Disposition: [name] completed total neoadjuvant therapy for treatment of rectal cancer.  She has  pain in the right upper extremity of unclear etiology.  She was diagnosed with a right IJ DVT on 02/09/2021.  A Doppler of the right upper extremity 03/30/2021 revealed improvement in  the right IJ thrombus and no other clot.  An MRI of the cervical spine revealed no explanation for right arm pain. ? ?[name] will continue hydrocodone as needed for pain.  She will call for increased pain or new symptoms.  She will return for an office visit next week. ? ? ?I discussed the assessment and treatment plan with the patient. The patient was provided an opportunity to ask questions and all were answered. The patient agreed with the plan and demonstrated an understanding of the instructions. ?  ?The patient was advised to call back or seek an in-person evaluation if the symptoms worsen or if the condition fails to improve as anticipated. ? ?I provided 25 minutes of chart review, telephone, and documentation time during this encounter, and > 50% was spent counseling as documented under my assessment & plan. ? ?[name] ANP/GNP-BC  ? ?04/07/2021 1:12 PM ? ? ? ? ? ? ? ? ?

## 2021-04-10 ENCOUNTER — Other Ambulatory Visit: Payer: 59

## 2021-04-10 ENCOUNTER — Other Ambulatory Visit: Payer: Self-pay

## 2021-04-10 ENCOUNTER — Ambulatory Visit (HOSPITAL_BASED_OUTPATIENT_CLINIC_OR_DEPARTMENT_OTHER)
Admission: RE | Admit: 2021-04-10 | Discharge: 2021-04-10 | Disposition: A | Discharge status: Home / Self Care | Payer: 59 | Source: Ambulatory Visit | Attending: Oncology | Admitting: Oncology

## 2021-04-10 ENCOUNTER — Telehealth: Payer: Self-pay

## 2021-04-10 ENCOUNTER — Ambulatory Visit: Payer: 59 | Admitting: Oncology

## 2021-04-10 DIAGNOSIS — M25511 Pain in right shoulder: Secondary | ICD-10-CM | POA: Diagnosis not present

## 2021-04-10 DIAGNOSIS — M79601 Pain in right arm: Secondary | ICD-10-CM | POA: Insufficient documentation

## 2021-04-10 IMAGING — DX DG HUMERUS 2V *R*
2 series · 2 of 2 positions shown · non-contrast
Comparison: None.

CLINICAL DATA: Right arm pain. Shoulder and arm pain for 1 month.
No known injury.

EXAM:
RIGHT HUMERUS - 2+ VIEW

[humerus ap]
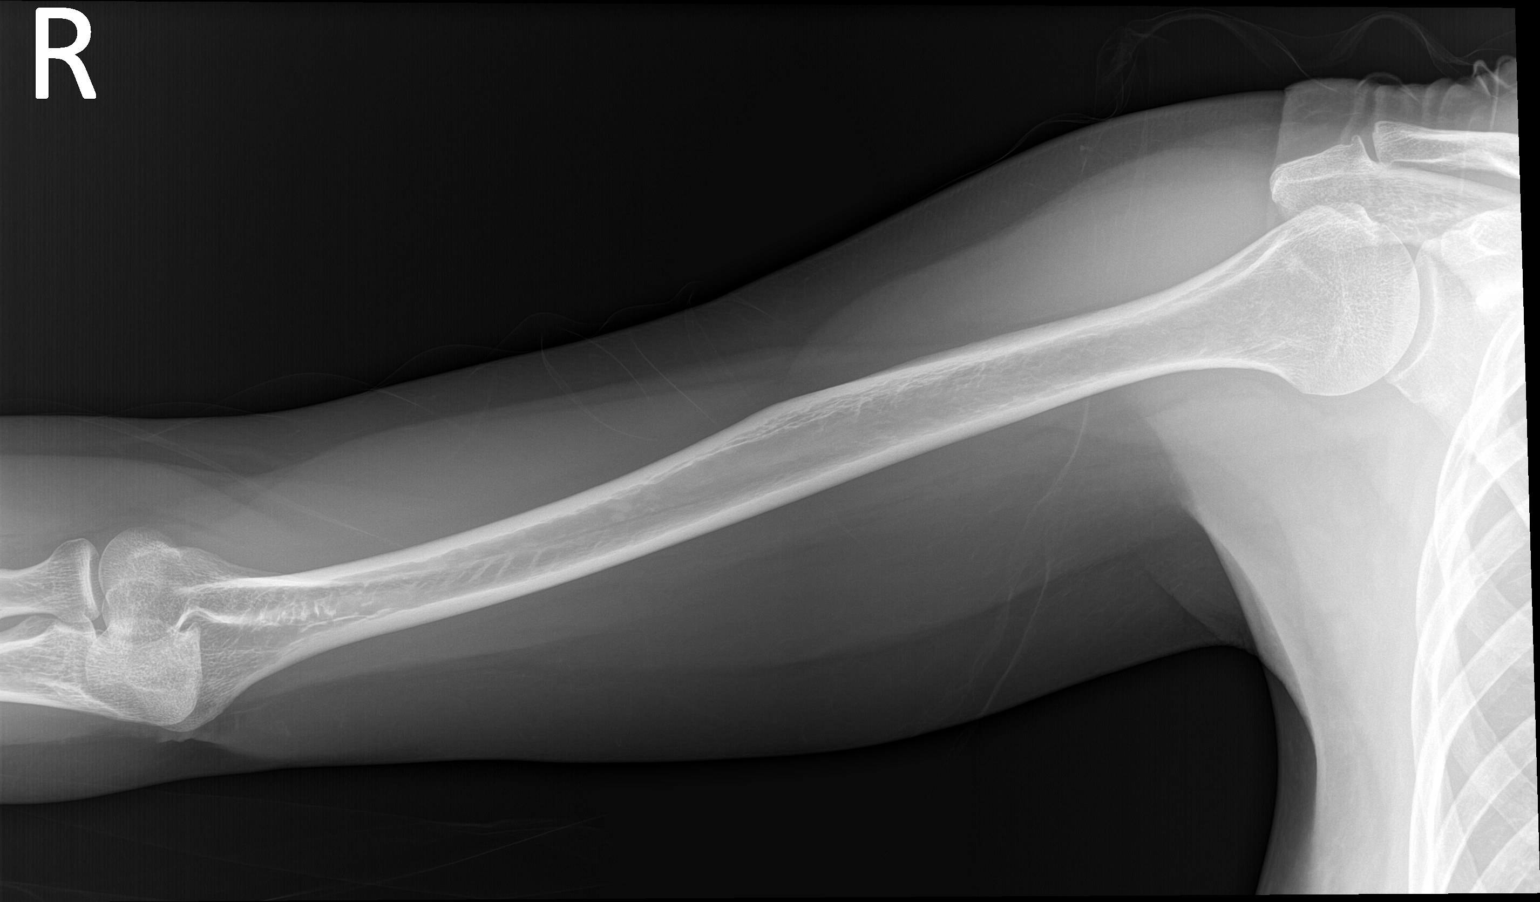

[humerus lat]
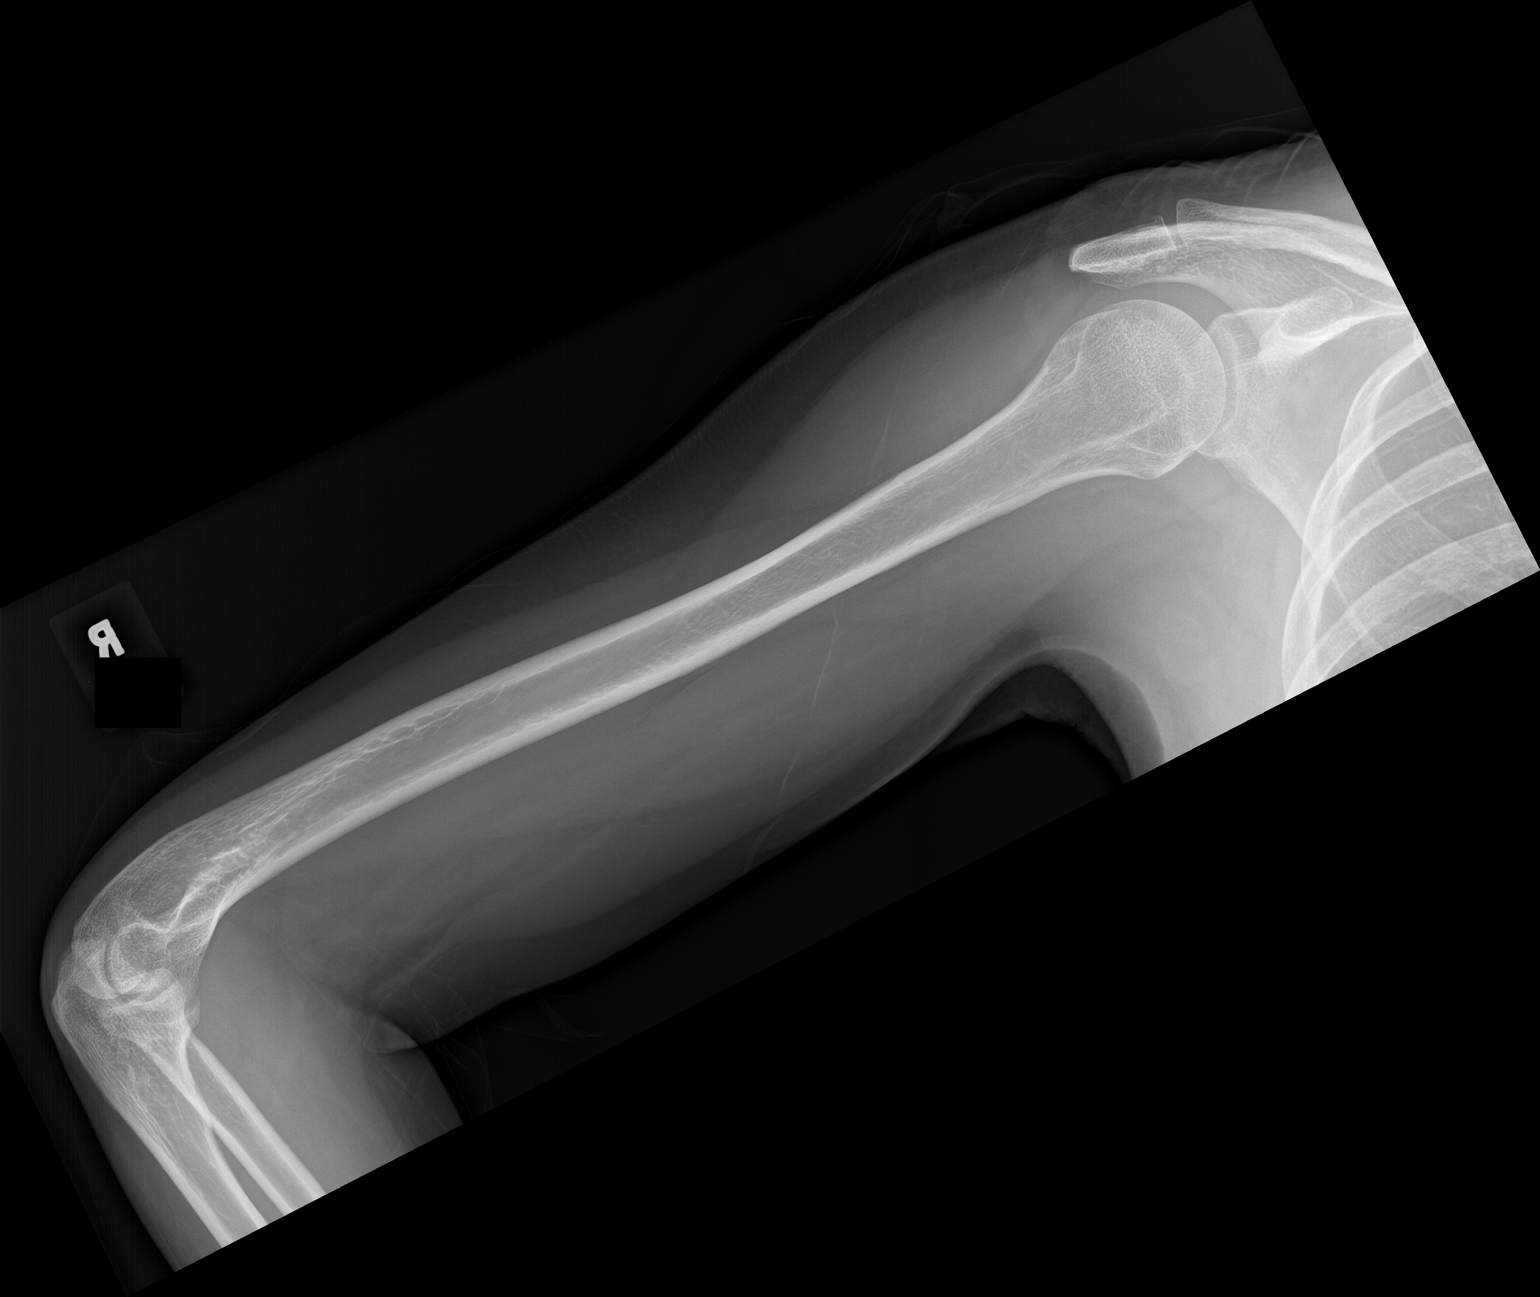

[2 of 2 positions shown; findings below may reference images not displayed]

FINDINGS: The cortical margins of the humerus are intact. There is no evidence
of fracture or other focal bone lesions. No erosion or periosteal
reaction. Elbow alignment is maintained. Soft tissues are
unremarkable.
IMPRESSION: Negative radiograph of the right humerus.

## 2021-04-10 IMAGING — DX DG SHOULDER 2+V*R*
3 series · 3 of 3 positions shown · non-contrast
Comparison: None.

CLINICAL DATA: Right arm and shoulder pain.  No known injury.

EXAM:
RIGHT SHOULDER - 2+ VIEW

[shoulder grashey]
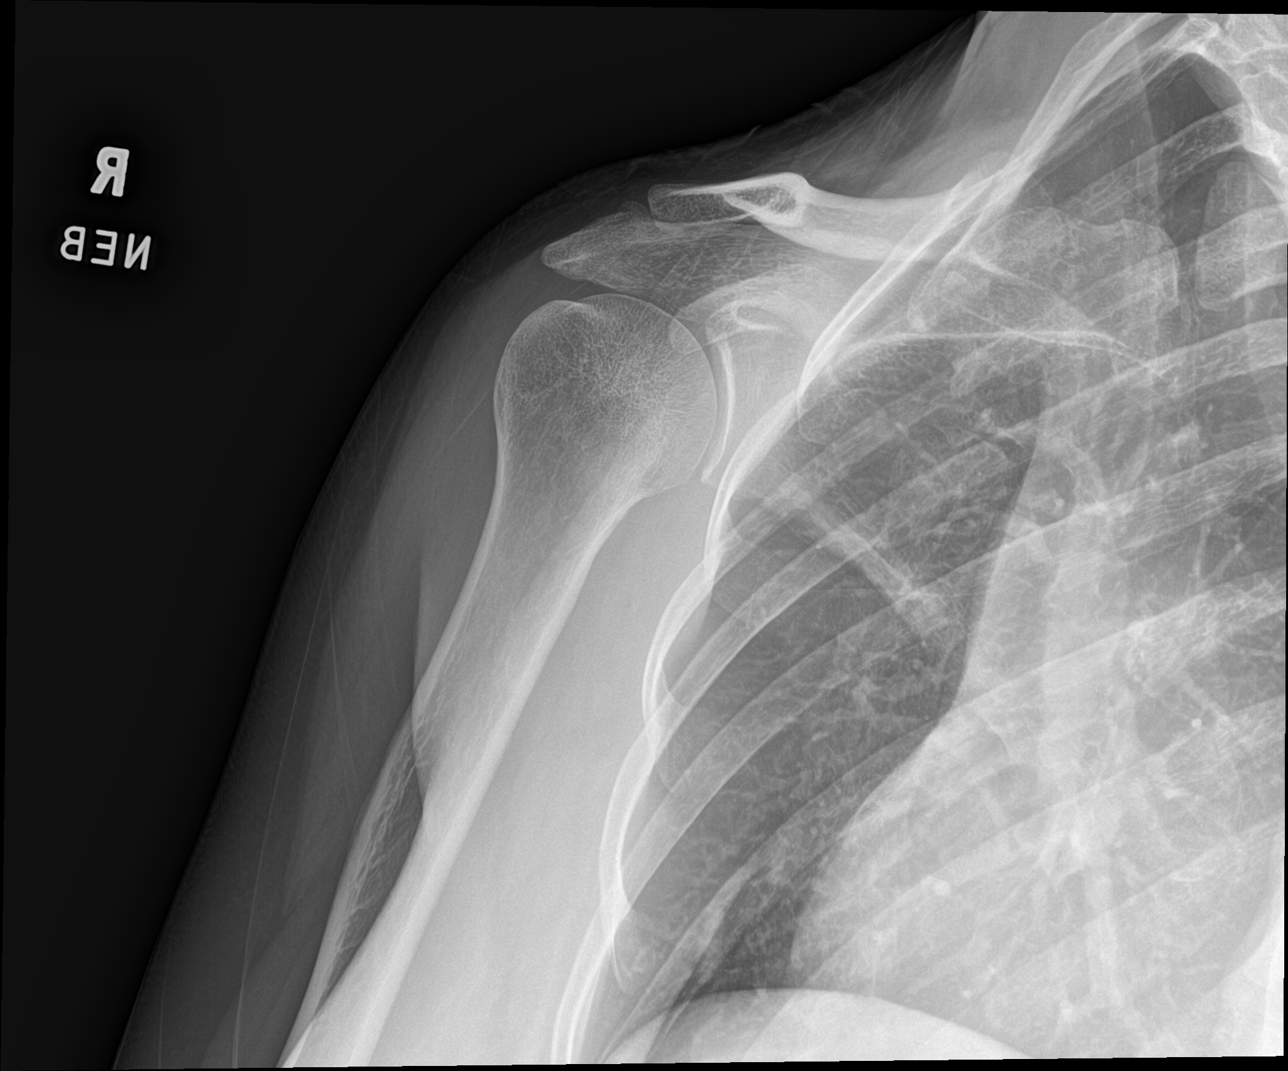

[shoulder y view]
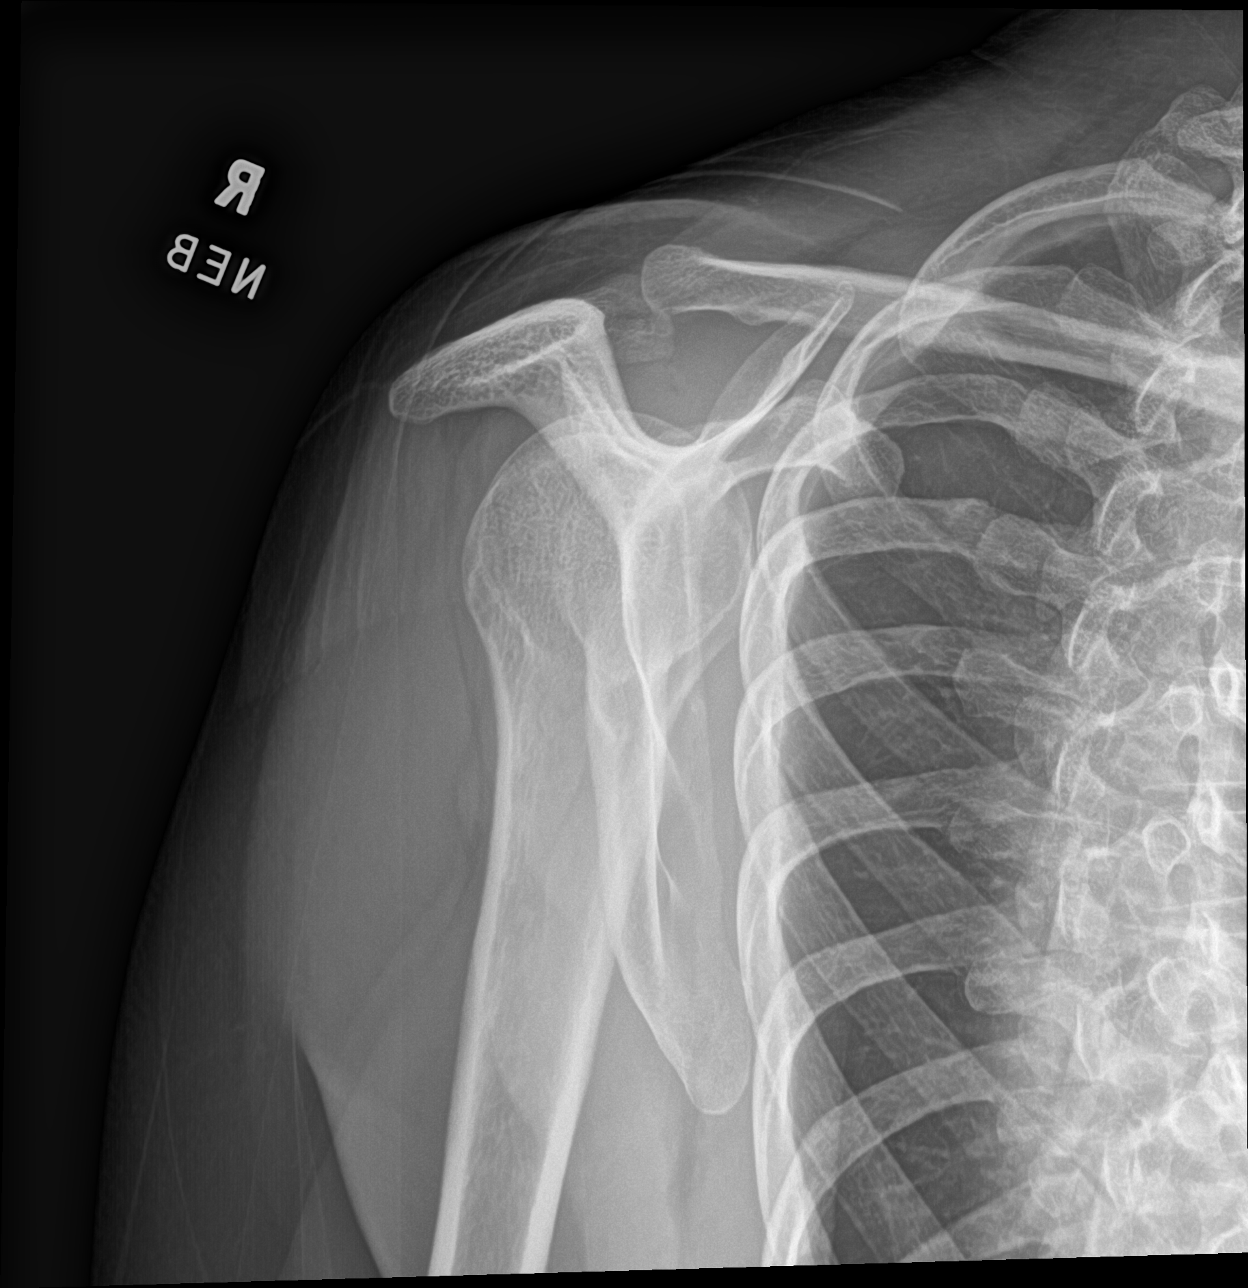

[shoulder axillary]
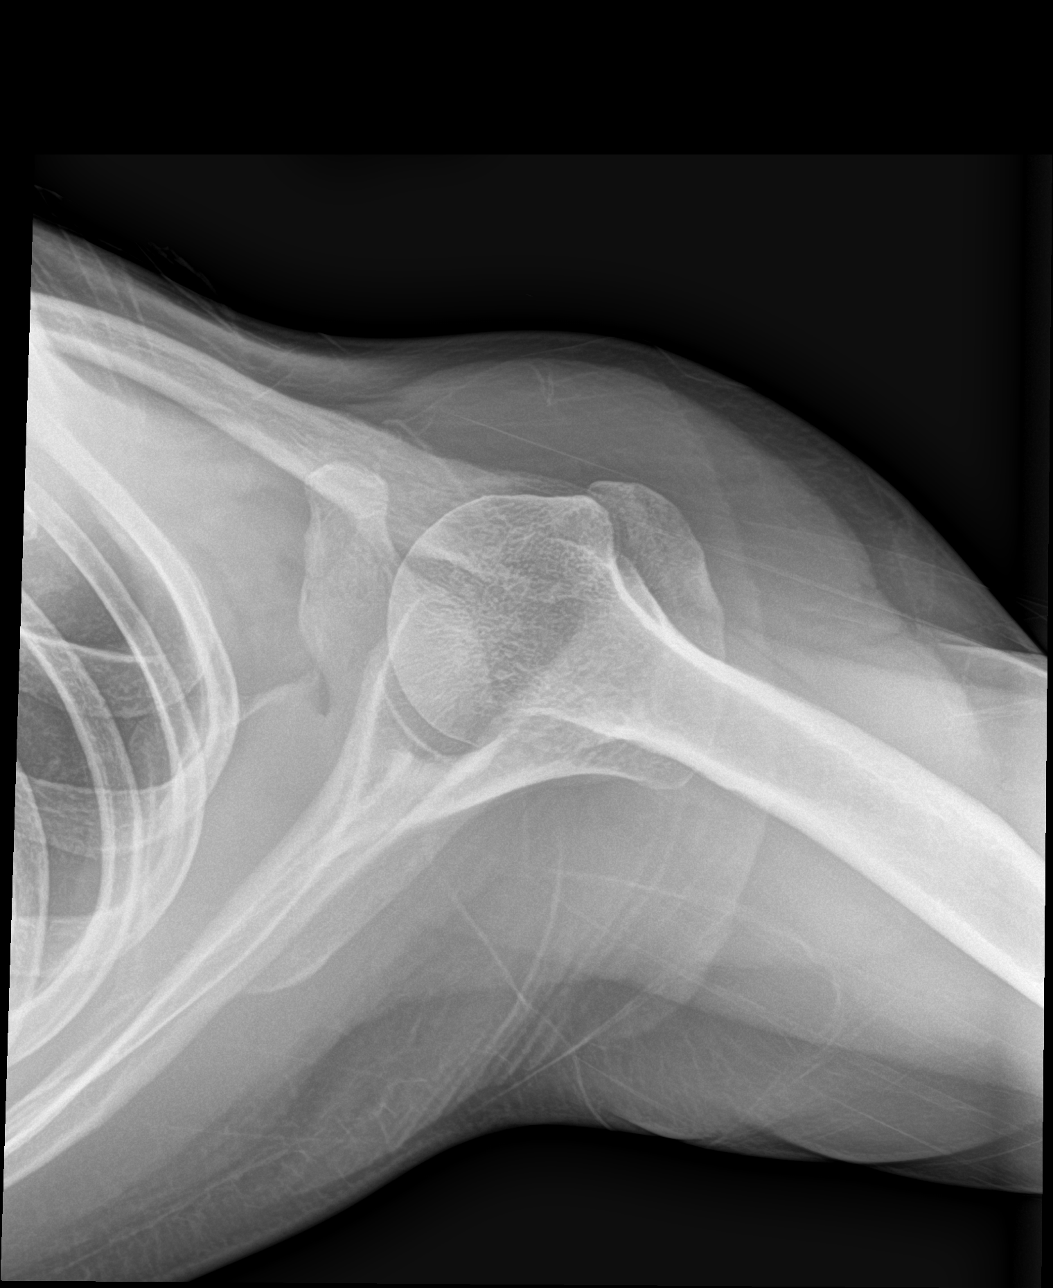

[3 of 3 positions shown; findings below may reference images not displayed]

FINDINGS: There is no evidence of fracture or dislocation. Normal alignment.
The joint spaces are preserved. There is no evidence of arthropathy
or other focal bone abnormality. No erosion or avascular necrosis.
No focal bone lesion. Soft tissues are unremarkable.
IMPRESSION: Negative radiographs of the right shoulder.

## 2021-04-10 NOTE — Telephone Encounter (Signed)
TC from Pt stating when she spoke with [name] on Friday he stated he would do a referral to Neuro Surgeon Pt states she would like that referral place because she is still having arm pain. Discussed with Dr [name] who stated we will send Pt for x-ray of shoulder and humerus informed Pt orders are in and she can go to any [facility] to get the x-rays. Pt verbalized understanding. ?

## 2021-04-11 ENCOUNTER — Encounter (HOSPITAL_COMMUNITY): Payer: Self-pay | Admitting: Emergency Medicine

## 2021-04-11 ENCOUNTER — Telehealth: Payer: Self-pay

## 2021-04-11 ENCOUNTER — Other Ambulatory Visit (HOSPITAL_COMMUNITY): Payer: Self-pay

## 2021-04-11 NOTE — Telephone Encounter (Signed)
TC to Pt to inform her that x-ray of the arm were negative and Dr [name] spoke with Dr Trenton Gammon who agreed to see her. Informed her Dr Trenton Gammon thinks she might have nerve compression in C6-C7 and could need a steroid injection. Pt grateful for some kind of answer to what's wrong with her arm and will be waiting for the call from Dr [facility] office. ?

## 2021-04-11 NOTE — Progress Notes (Signed)
STD disability through the [employer] completed and faxed to [phone] (conformation number [id number]). Sent to HIM. ? ?

## 2021-04-12 DIAGNOSIS — Z6826 Body mass index (BMI) 26.0-26.9, adult: Secondary | ICD-10-CM | POA: Diagnosis not present

## 2021-04-12 DIAGNOSIS — R030 Elevated blood-pressure reading, without diagnosis of hypertension: Secondary | ICD-10-CM | POA: Diagnosis not present

## 2021-04-12 DIAGNOSIS — M5412 Radiculopathy, cervical region: Secondary | ICD-10-CM | POA: Diagnosis not present

## 2021-04-14 ENCOUNTER — Other Ambulatory Visit: Payer: Self-pay

## 2021-04-14 ENCOUNTER — Inpatient Hospital Stay: Payer: 59 | Admitting: Nurse Practitioner

## 2021-04-14 ENCOUNTER — Inpatient Hospital Stay: Payer: 59

## 2021-04-14 ENCOUNTER — Other Ambulatory Visit (HOSPITAL_COMMUNITY): Payer: Self-pay

## 2021-04-14 ENCOUNTER — Encounter: Payer: Self-pay | Admitting: Nurse Practitioner

## 2021-04-14 DIAGNOSIS — C20 Malignant neoplasm of rectum: Secondary | ICD-10-CM

## 2021-04-14 DIAGNOSIS — R0789 Other chest pain: Secondary | ICD-10-CM | POA: Diagnosis not present

## 2021-04-14 DIAGNOSIS — N3000 Acute cystitis without hematuria: Secondary | ICD-10-CM

## 2021-04-14 DIAGNOSIS — I82C11 Acute embolism and thrombosis of right internal jugular vein: Secondary | ICD-10-CM | POA: Diagnosis not present

## 2021-04-14 DIAGNOSIS — M79601 Pain in right arm: Secondary | ICD-10-CM | POA: Diagnosis not present

## 2021-04-14 LAB — BASIC METABOLIC PANEL - CANCER CENTER ONLY
Anion gap: 8 (ref 5–15)
BUN: 9 mg/dL (ref 6–20)
CO2: 26 mmol/L (ref 22–32)
Calcium: 9.4 mg/dL (ref 8.9–10.3)
Chloride: 105 mmol/L (ref 98–111)
Creatinine: 0.65 mg/dL (ref 0.44–1.00)
GFR, Estimated: >60 mL/min (ref >60)
Glucose, Bld: 84 mg/dL (ref 70–99)
Potassium: 3.3 mmol/L — ABNORMAL LOW (ref 3.5–5.1)
Sodium: 139 mmol/L (ref 135–145)

## 2021-04-14 MED ORDER — POTASSIUM CHLORIDE CRYS ER 20 MEQ PO TBCR: 20.0000 meq | EXTENDED_RELEASE_TABLET | Freq: Every day | ORAL | 1 refills | Status: DC

## 2021-04-14 NOTE — Progress Notes (Signed)
?[place] ?OFFICE PROGRESS NOTE ? ? ?Diagnosis: Rectal cancer ? ?INTERVAL HISTORY:  ? ?Sharon Bennett returns as scheduled.  She continues to have severe right arm pain.  She reports she is scheduled for surgery early next week for a "ruptured disc".  She has intermittent indigestion.  Last night she woke up with severe chest pain.  The pain lasted about 45 minutes.  She had a similar occurrence about a week ago.  No shortness of breath, diaphoresis, radiation of pain to the left arm or nausea last night.  A week ago she did have nausea.  She wonders if the pain is due to indigestion but is concerned because it is more severe than typical indigestion symptoms she has experienced in the past.  She was on Protonix before beginning Xeloda.  It was placed on hold when she began Xeloda.  She has not resumed it.  She reports multiple family members have had sudden cardiac death in their 44s-50s. ? ?Objective: ? ?Vital signs in last 24 hours: ? ?Blood pressure (!) 130/91, pulse 94, temperature 97.8 ?F (36.6 ?C), temperature source Oral, resp. rate 18, height 5' 5"  (1.651 m), weight 162 lb (73.5 kg), SpO2 100 %. ?  ? ?HEENT: Mild white coating over tongue. ?Resp: Lungs clear bilaterally. ?Cardio: Regular rate and rhythm. ?GI: Abdomen soft and nontender.  No hepatosplenomegaly. ?Vascular: No leg edema.  Calves soft and nontender. ? ? ?Lab Results: ? ?Lab Results  ?Component Value Date  ? WBC 4.2 03/27/2021  ? HGB 11.8 (L) 03/27/2021  ? HCT 36.7 03/27/2021  ? MCV 88.4 03/27/2021  ? PLT 237 03/27/2021  ? NEUTROABS 2.7 03/27/2021  ? ? ?Imaging: ? ?No results found. ? ?Medications: I have reviewed the patient's current medications. ? ?Assessment/Plan: ?Rectal cancer  ?CT abdomen/pelvis 08/21/2020-large rectal mass measuring 4.4 x 2.6 x 5.5 cm, haziness in the mesorectal fat, obscuration of the fat plane between the mass and the adjacent posterior aspect of the uterus, numerous prominent mesorectal lymph nodes  measuring up to 5 mm ?Colonoscopy 08/23/2020-fungating infiltrative nonobstructing large mass found in the rectum.  Mass was noncircumferential, measured 5 cm in length, 3 cm in diameter and was 7 cm from the anal verge.  No bleeding was present.   ?Pathology showed invasive colonic adenocarcinoma, moderately differentiated.  No evidence of mismatch repair deficiency. ?Chest CT 09/01/2020-no evidence of metastatic disease ?MRI pelvis 09/02/2020-tumor at 10.4 cm from anal verge, 5.9 cm from the internal anal sphincter, T3, approximately 15 greater than 5 mm lymph nodes, N2b, multiple uterine fibroids ?Cycle 1 FOLFOX 09/15/2020 ?Cycle 2 FOLFOX 09/29/2020 ?Cycle 3 FOLFOX 10/13/2020 ?Cycle 4 FOLFOX 10/27/2020 ?Cycle 5 FOLFOX 11/10/2020 ?Cycle 6 FOLFOX 11/24/2020 ?Cycle 7 FOLFOX 12/08/2020 ?Cycle 8 FOLFOX 01/05/2021, Ziextenzo ?Radiation/Xeloda 01/23/2021-03/03/2021 ?2.   rectal bleeding, change in bowel habits secondary to #1 ?3.   Uterine fibroids noted on MRI pelvis 09/02/2020 ?4.   Port-A-Cath placement 09/09/2020, Interventional Radiology, Port-A-Cath removed 04/04/2021 ?5.  Admission 12/14/2020 with febrile neutropenia, E. coli bacteremia-completed an outpatient course of Augmentin ?6.  Right IJ DVT 02/09/2021-apixaban; Right upper extremity Doppler 03/30/2021-improvement in right IJ thrombus, no other ?7.  Right upper extremity pain-03/31/2021 MRI cervical spine-no impingement, inflammation or mass to explain symptoms; disc bulging and ridging at C4-5 to C6-7.  She has seen [name], neurosurgery and is scheduled for surgery early next week for a ruptured disc. ?  ?Disposition: Sharon Bennett appears stable.  She is scheduled for surgery Monday to address the  arm pain/ruptured disc. ? ?Recent CTs show improvement in the wall thickening at the low rectum, no evidence of metastatic disease.  She is scheduled for rectal surgery 05/10/2021. ? ?At today's visit she reports episodes of severe chest pain.  We discussed the possibility the pain is  due to indigestion.  She will resume Protonix.  If the pain recurs she will try Maalox or Mylanta.  She understands to seek emergency evaluation if the pain persists or she develops other concerning symptoms such as shortness of breath, nausea, diaphoresis, radiation of pain.  She will contact [name] to discuss further evaluation.  We recommend she discuss the episodes of chest pain as well as her family history with the anesthesiologist prior to surgery next week. ? ?She will return for follow-up here the week of 05/29/2021. ? ?Patient seen with [name]. ? ? ?[name] ANP/GNP-BC  ? ?04/14/2021  ?8:49 AM ?This was a shared visit with [name].  Sharon Bennett was interviewed and examined.  The restaging CTs reveal no evidence of metastatic rectal cancer.  She is scheduled for surgery in April. ? ?She continues to have pain in the right arm.  She was evaluated [name] and is scheduled for cervical disc surgery on 04/17/2021.  I discussed the case with [name] earlier this week. ?She reported recent episodes of substernal chest pain.  No associated symptoms.  We recommended she resume Protonix.  I have a low clinical suspicion for angina, but she related a family history of early cardiac death.  I recommended she discuss the chest pain with anesthesiology prior to the surgery next week.  I will let [name] know about her symptoms. ? ?I will plan to see her in the hospital after the rectal surgery. ?The plan is to continue apixaban anticoagulation for a total of 3 months.  Anticoagulation will be held for the planned cervical spine surgery. ? ?I was present for greater than 50% of today's visit.  I performed medical decision making. ? ?[name], MD ? ? ? ? ? ? ?

## 2021-04-15 ENCOUNTER — Telehealth: Payer: Self-pay

## 2021-04-15 ENCOUNTER — Other Ambulatory Visit (HOSPITAL_COMMUNITY): Payer: Self-pay

## 2021-04-15 MED ORDER — POTASSIUM CHLORIDE CRYS ER 20 MEQ PO TBCR
EXTENDED_RELEASE_TABLET | ORAL | 1 refills | Status: DC
  Filled 2021-04-15: qty 30, 30d supply, fill #0

## 2021-04-15 NOTE — Telephone Encounter (Signed)
Pt verbalized understanding.

## 2021-04-15 NOTE — Telephone Encounter (Signed)
-----   Message from Owens Shark, NP sent at 04/14/2021  3:53 PM EST ----- ?Please let her know potassium remains mildly low.  She should continue K-Dur 20 meq daily.  I will send a new prescription to her pharmacy. ? ?

## 2021-04-17 ENCOUNTER — Inpatient Hospital Stay: Payer: 59

## 2021-04-17 ENCOUNTER — Inpatient Hospital Stay: Payer: 59 | Admitting: Nurse Practitioner

## 2021-04-17 ENCOUNTER — Other Ambulatory Visit (HOSPITAL_COMMUNITY): Payer: Self-pay

## 2021-04-17 DIAGNOSIS — M4802 Spinal stenosis, cervical region: Secondary | ICD-10-CM | POA: Diagnosis not present

## 2021-04-17 DIAGNOSIS — M50123 Cervical disc disorder at C6-C7 level with radiculopathy: Secondary | ICD-10-CM | POA: Diagnosis not present

## 2021-04-17 DIAGNOSIS — M5412 Radiculopathy, cervical region: Secondary | ICD-10-CM | POA: Diagnosis not present

## 2021-04-17 MED ORDER — CYCLOBENZAPRINE HCL 10 MG PO TABS
10.0000 mg | ORAL_TABLET | Freq: Three times a day (TID) | ORAL | 0 refills | Status: DC | PRN
Start: 2021-04-17 — End: 2021-05-14
  Filled 2021-04-17: qty 30, 10d supply, fill #0

## 2021-04-17 MED ORDER — HYDROCODONE-ACETAMINOPHEN 5-325 MG PO TABS
1.0000 | ORAL_TABLET | ORAL | 0 refills | Status: DC | PRN
  Filled 2021-04-17: qty 30, 5d supply, fill #0

## 2021-04-19 ENCOUNTER — Encounter (HOSPITAL_COMMUNITY): Payer: Self-pay | Admitting: *Deleted

## 2021-04-19 ENCOUNTER — Other Ambulatory Visit: Payer: Self-pay

## 2021-04-19 ENCOUNTER — Emergency Department (HOSPITAL_COMMUNITY): Payer: 59 | Admitting: Certified Registered Nurse Anesthetist

## 2021-04-19 ENCOUNTER — Observation Stay (HOSPITAL_COMMUNITY)
Admission: EM | Admit: 2021-04-19 | Discharge: 2021-04-20 | Disposition: A | Discharge status: Home / Self Care | Payer: 59 | Attending: Neurosurgery | Admitting: Neurosurgery

## 2021-04-19 ENCOUNTER — Encounter (HOSPITAL_COMMUNITY)
Admission: EM | Disposition: A | Discharge status: Home / Self Care | Payer: Self-pay | Source: Home / Self Care | Attending: Emergency Medicine

## 2021-04-19 ENCOUNTER — Emergency Department (HOSPITAL_BASED_OUTPATIENT_CLINIC_OR_DEPARTMENT_OTHER): Payer: 59 | Admitting: Certified Registered Nurse Anesthetist

## 2021-04-19 ENCOUNTER — Emergency Department (HOSPITAL_COMMUNITY): Payer: 59

## 2021-04-19 DIAGNOSIS — G9761 Postprocedural hematoma of a nervous system organ or structure following a nervous system procedure: Secondary | ICD-10-CM | POA: Diagnosis not present

## 2021-04-19 DIAGNOSIS — M96840 Postprocedural hematoma of a musculoskeletal structure following a musculoskeletal system procedure: Secondary | ICD-10-CM | POA: Diagnosis not present

## 2021-04-19 DIAGNOSIS — G959 Disease of spinal cord, unspecified: Secondary | ICD-10-CM

## 2021-04-19 DIAGNOSIS — Z85038 Personal history of other malignant neoplasm of large intestine: Secondary | ICD-10-CM | POA: Diagnosis not present

## 2021-04-19 DIAGNOSIS — Y838 Other surgical procedures as the cause of abnormal reaction of the patient, or of later complication, without mention of misadventure at the time of the procedure: Secondary | ICD-10-CM | POA: Diagnosis not present

## 2021-04-19 DIAGNOSIS — M2578 Osteophyte, vertebrae: Secondary | ICD-10-CM | POA: Diagnosis not present

## 2021-04-19 DIAGNOSIS — Z0389 Encounter for observation for other suspected diseases and conditions ruled out: Secondary | ICD-10-CM | POA: Diagnosis not present

## 2021-04-19 DIAGNOSIS — R079 Chest pain, unspecified: Secondary | ICD-10-CM | POA: Diagnosis not present

## 2021-04-19 DIAGNOSIS — J9811 Atelectasis: Secondary | ICD-10-CM | POA: Diagnosis not present

## 2021-04-19 DIAGNOSIS — A419 Sepsis, unspecified organism: Secondary | ICD-10-CM | POA: Diagnosis not present

## 2021-04-19 DIAGNOSIS — R531 Weakness: Secondary | ICD-10-CM | POA: Diagnosis not present

## 2021-04-19 DIAGNOSIS — S064XAA Epidural hemorrhage with loss of consciousness status unknown, initial encounter: Secondary | ICD-10-CM | POA: Diagnosis present

## 2021-04-19 DIAGNOSIS — G8918 Other acute postprocedural pain: Secondary | ICD-10-CM | POA: Diagnosis present

## 2021-04-19 DIAGNOSIS — R000 Tachycardia, unspecified: Secondary | ICD-10-CM | POA: Diagnosis not present

## 2021-04-19 LAB — I-STAT CHEM 8, ED
BUN: 5 mg/dL — ABNORMAL LOW (ref 6–20)
Calcium, Ion: 1.07 mmol/L — ABNORMAL LOW (ref 1.15–1.40)
Chloride: 105 mmol/L (ref 98–111)
Creatinine, Ser: 0.4 mg/dL — ABNORMAL LOW (ref 0.44–1.00)
Glucose, Bld: 109 mg/dL — ABNORMAL HIGH (ref 70–99)
HCT: 36 % (ref 36.0–46.0)
Hemoglobin: 12.2 g/dL (ref 12.0–15.0)
Potassium: 3.4 mmol/L — ABNORMAL LOW (ref 3.5–5.1)
Sodium: 138 mmol/L (ref 135–145)
TCO2: 23 mmol/L (ref 22–32)

## 2021-04-19 LAB — COMPREHENSIVE METABOLIC PANEL
ALT: 31 U/L (ref 0–44)
AST: 26 U/L (ref 15–41)
Albumin: 3.7 g/dL (ref 3.5–5.0)
Alkaline Phosphatase: 62 U/L (ref 38–126)
Anion gap: 14 (ref 5–15)
BUN: 5 mg/dL — ABNORMAL LOW (ref 6–20)
CO2: 22 mmol/L (ref 22–32)
Calcium: 8.9 mg/dL (ref 8.9–10.3)
Chloride: 101 mmol/L (ref 98–111)
Creatinine, Ser: 0.47 mg/dL (ref 0.44–1.00)
GFR, Estimated: >60 mL/min (ref >60)
Glucose, Bld: 105 mg/dL — ABNORMAL HIGH (ref 70–99)
Potassium: 3.3 mmol/L — ABNORMAL LOW (ref 3.5–5.1)
Sodium: 137 mmol/L (ref 135–145)
Total Bilirubin: 0.5 mg/dL (ref 0.3–1.2)
Total Protein: 7.5 g/dL (ref 6.5–8.1)

## 2021-04-19 LAB — CBC WITH DIFFERENTIAL/PLATELET
Abs Immature Granulocytes: 0.04 10*3/uL (ref 0.00–0.07)
Basophils Absolute: 0 10*3/uL (ref 0.0–0.1)
Basophils Relative: 1 %
Eosinophils Absolute: 0 10*3/uL (ref 0.0–0.5)
Eosinophils Relative: 0 %
HCT: 36.2 % (ref 36.0–46.0)
Hemoglobin: 11.6 g/dL — ABNORMAL LOW (ref 12.0–15.0)
Immature Granulocytes: 1 %
Lymphocytes Relative: 11 %
Lymphs Abs: 0.9 10*3/uL (ref 0.7–4.0)
MCH: 29.1 pg (ref 26.0–34.0)
MCHC: 32 g/dL (ref 30.0–36.0)
MCV: 90.7 fL (ref 80.0–100.0)
Monocytes Absolute: 1.1 10*3/uL — ABNORMAL HIGH (ref 0.1–1.0)
Monocytes Relative: 13 %
Neutro Abs: 6.5 10*3/uL (ref 1.7–7.7)
Neutrophils Relative %: 74 %
Platelets: 244 10*3/uL (ref 150–400)
RBC: 3.99 MIL/uL (ref 3.87–5.11)
RDW: 14.5 % (ref 11.5–15.5)
WBC: 8.6 10*3/uL (ref 4.0–10.5)
nRBC: 0 % (ref 0.0–0.2)

## 2021-04-19 LAB — LACTIC ACID, PLASMA: Lactic Acid, Venous: 0.9 mmol/L (ref 0.5–1.9)

## 2021-04-19 LAB — CBG MONITORING, ED: Glucose-Capillary: 95 mg/dL (ref 70–99)

## 2021-04-19 LAB — I-STAT BETA HCG BLOOD, ED (MC, WL, AP ONLY): I-stat hCG, quantitative: <5 m[IU]/mL (ref <5)

## 2021-04-19 LAB — PROTIME-INR
INR: 1.2 (ref 0.8–1.2)
Prothrombin Time: 15 seconds (ref 11.4–15.2)

## 2021-04-19 LAB — APTT: aPTT: 38 seconds — ABNORMAL HIGH (ref 24–36)

## 2021-04-19 IMAGING — DX DG CHEST 1V PORT
1 series · 1 of 1 positions shown · non-contrast
Comparison: [DATE].

CLINICAL DATA: Evaluate for abnormality.  Questionable sepsis.

EXAM:
PORTABLE CHEST 1 VIEW

[chest ap]
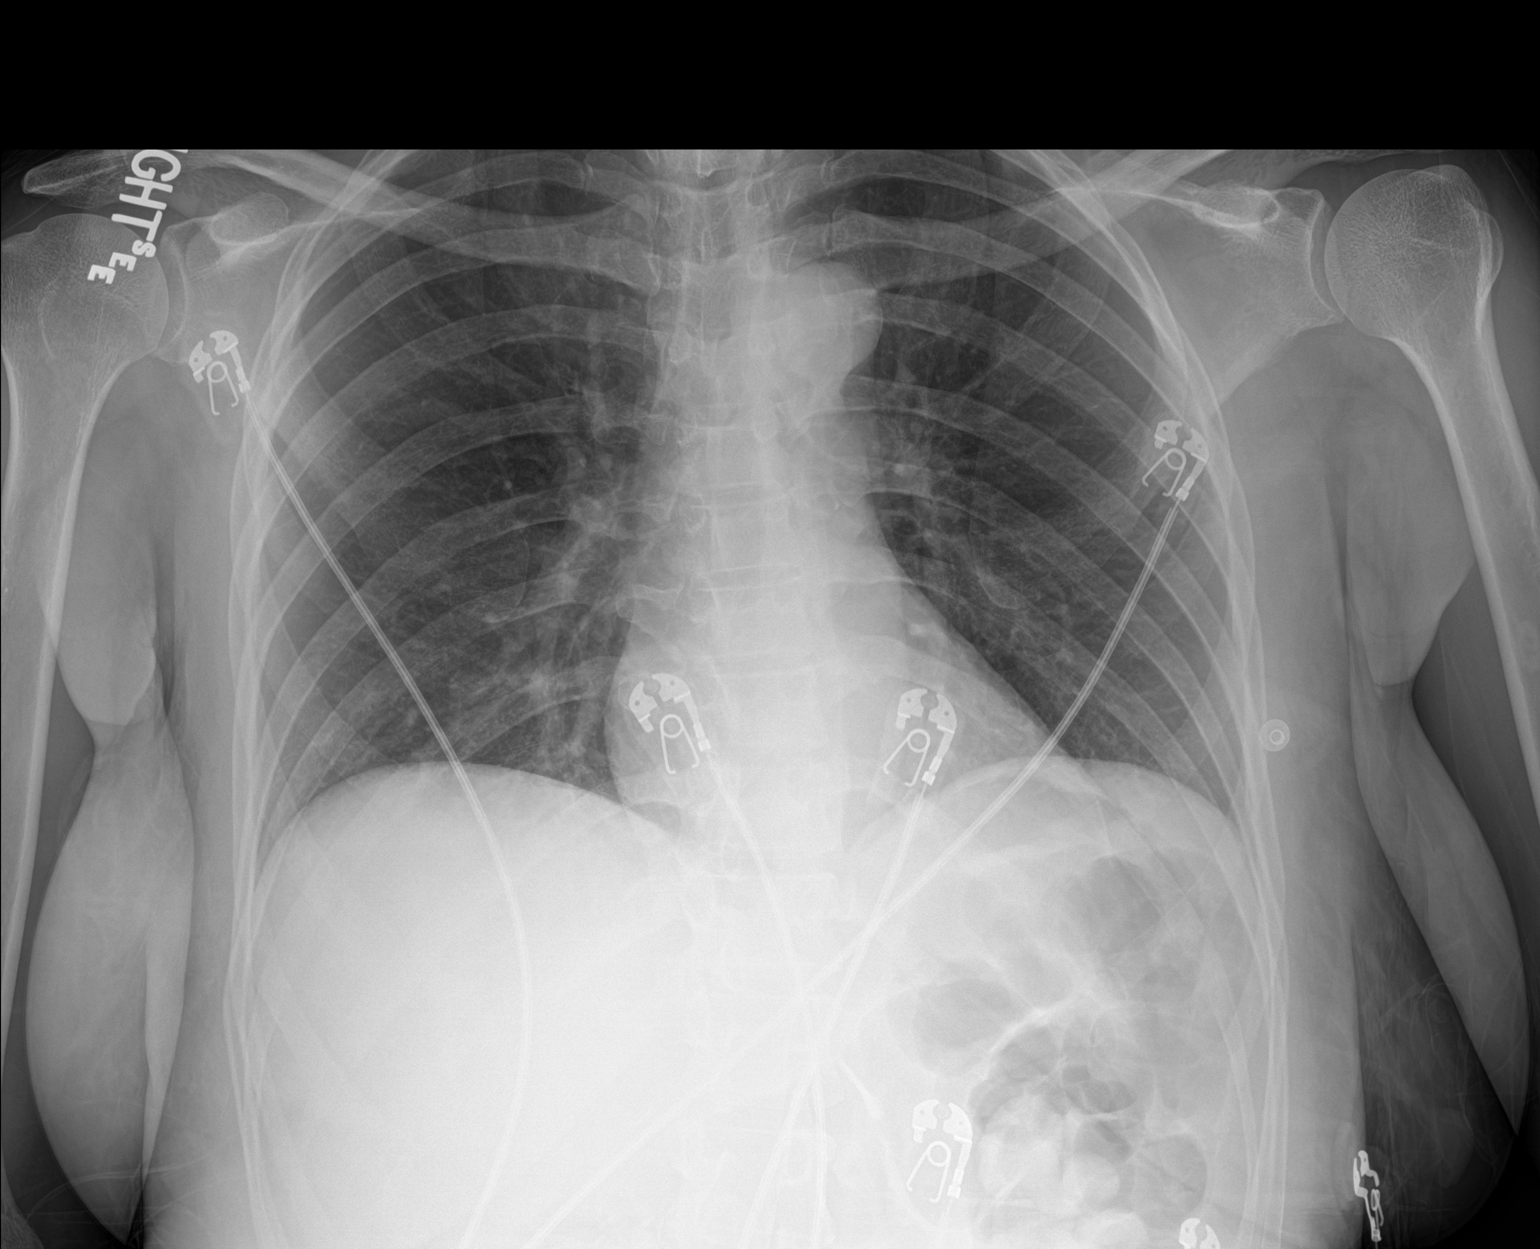

[1 of 1 positions shown; findings below may reference images not displayed]

FINDINGS: The heart size and mediastinal contours are within normal limits.
Both lungs are clear. The visualized skeletal structures are
unremarkable.
IMPRESSION: No active disease.

## 2021-04-19 IMAGING — MR MR CERVICAL SPINE WO/W CM
4 of 8 series · 18 of 48 positions shown · IV contrast (gadavist)
Comparison: MRI cervical [DATE].

CLINICAL DATA: Osteomyelitis, cervical

EXAM:
MRI CERVICAL SPINE WITHOUT AND WITH CONTRAST
TECHNIQUE: Multiplanar and multiecho pulse sequences of the cervical spine, to
include the craniocervical junction and cervicothoracic junction,
were obtained without and with intravenous contrast.
CONTRAST:  7mL GADAVIST GADOBUTROL 1 MMOL/ML IV SOLN

[Series 2: T2 · sagittal · 3.0mm · 0.43mm/px · 4 of 14 slices shown (1 of 2)]
[im 1/14]
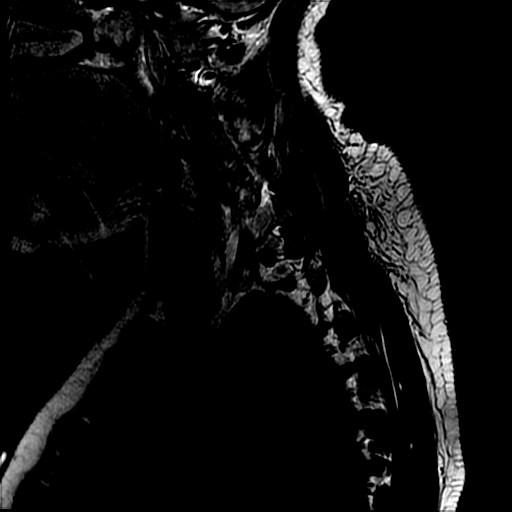
[im 5/14]
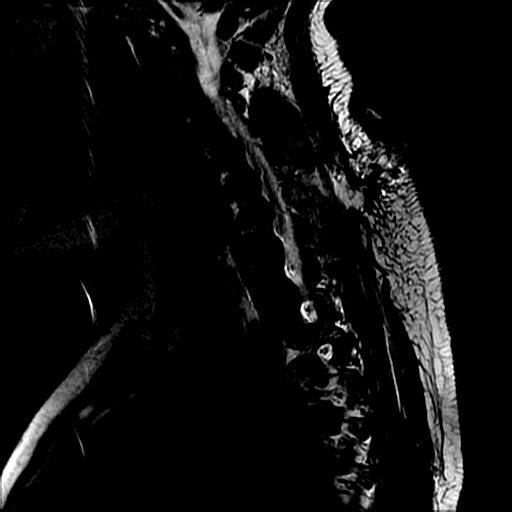
[im 9/14]
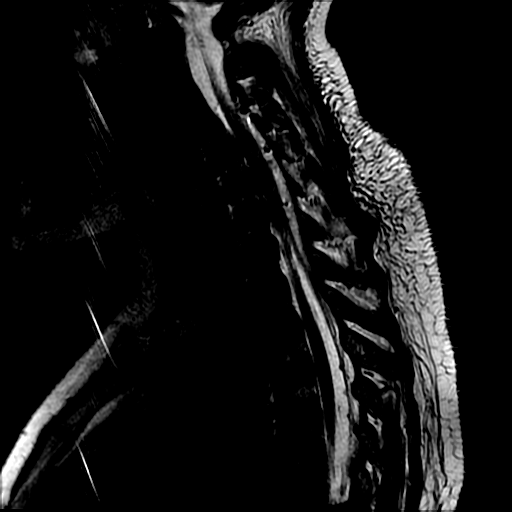
[im 14/14]
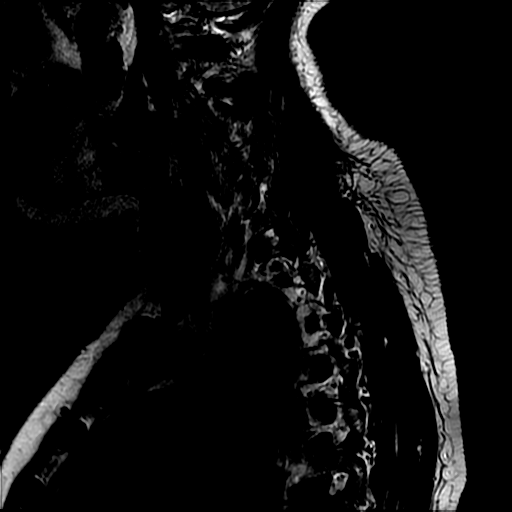

[Series 3: T1 · sagittal · 3.0mm · 0.43mm/px · 3 of 14 slices shown (1 of 2)]
[im 1/14]
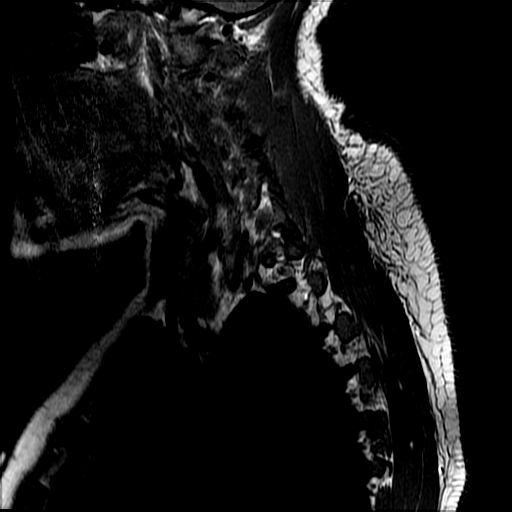
[im 9/14]
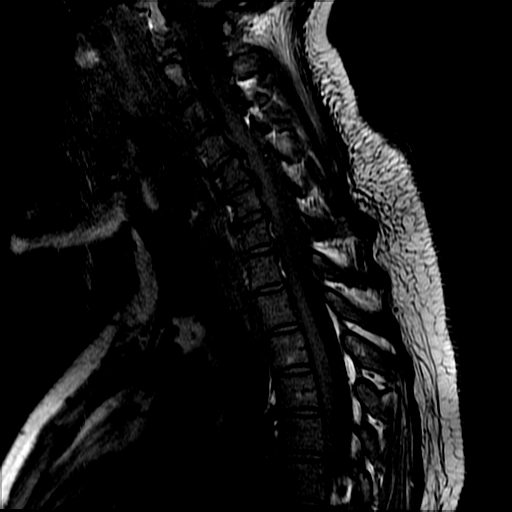
[im 14/14]
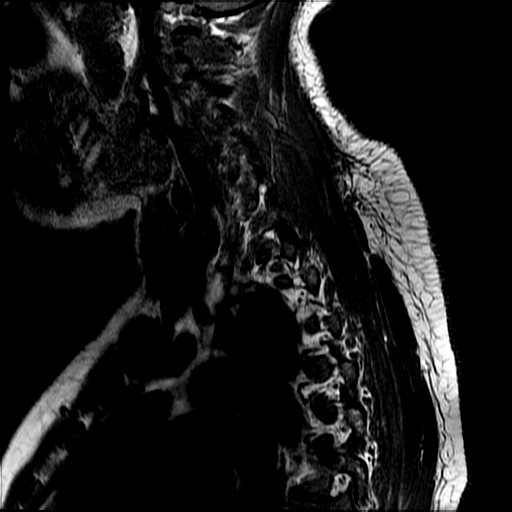

[Series 5: T2 · axial · 3.0mm · 0.39mm/px · z∈[-52,+29]mm · 8 of 27 slices shown (2 of 2)]
[im 1/27]
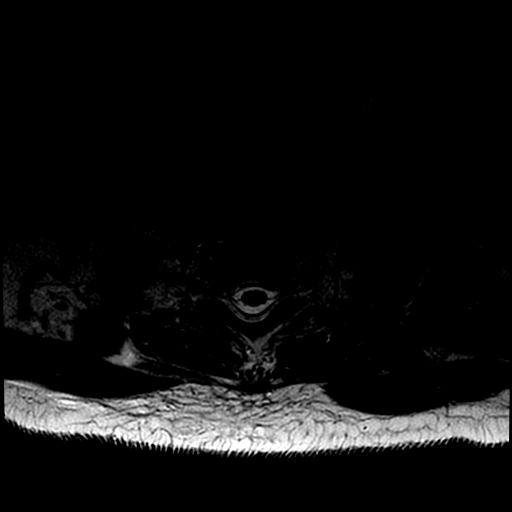
[im 4/27]
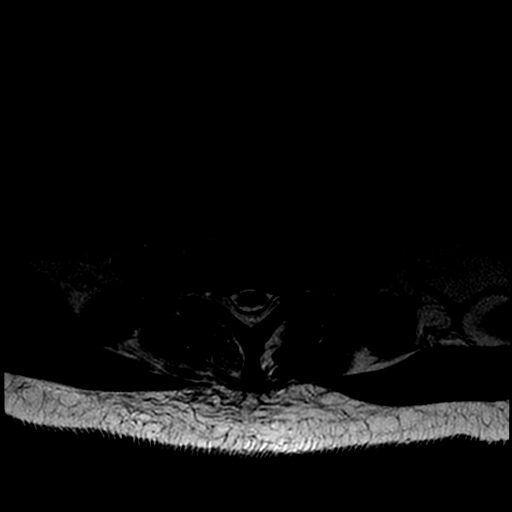
[im 8/27]
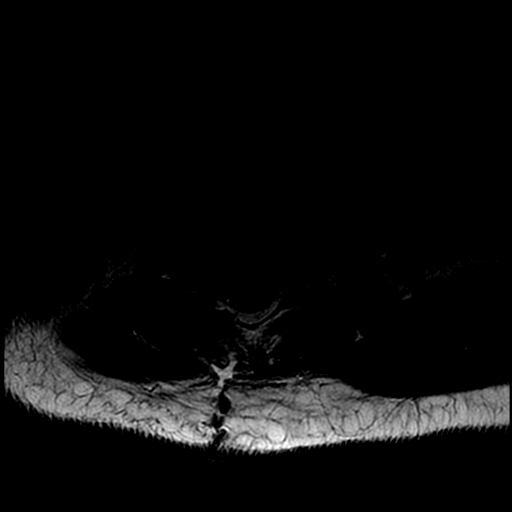
[im 12/27]
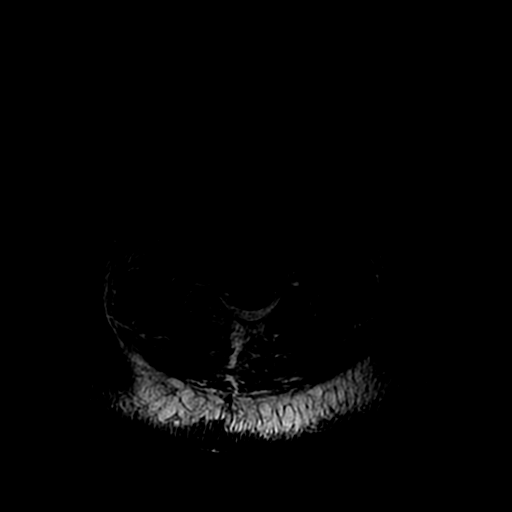
[im 15/27]
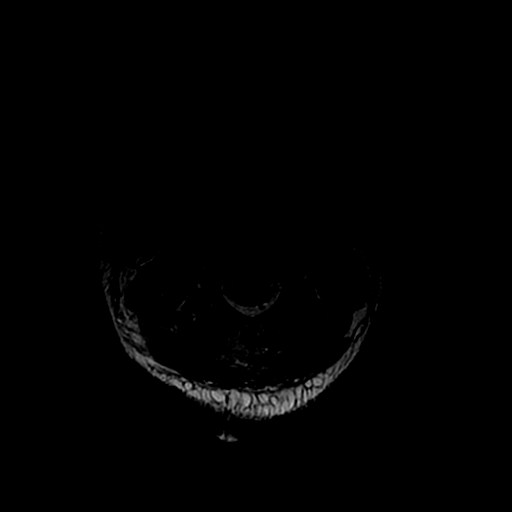
[im 19/27]
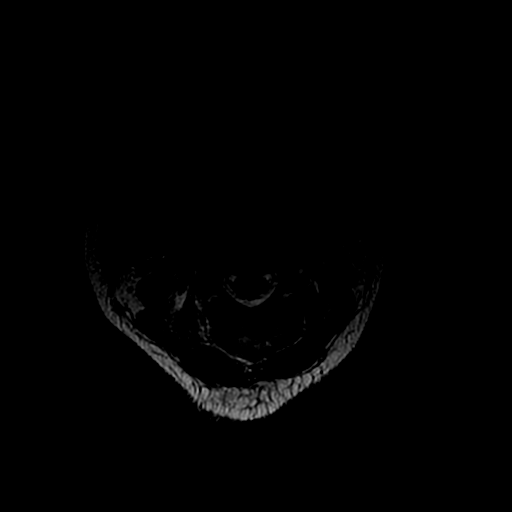
[im 23/27]
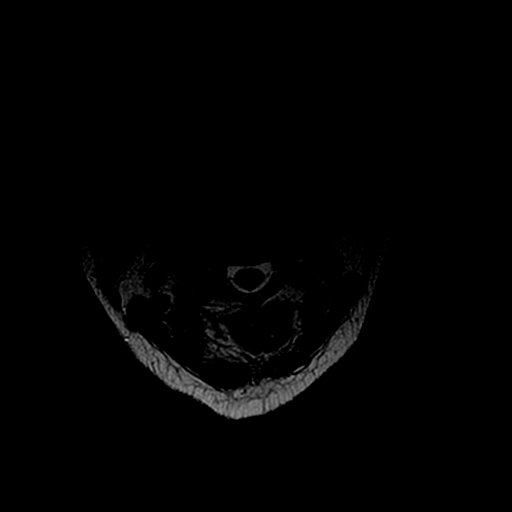
[im 27/27]
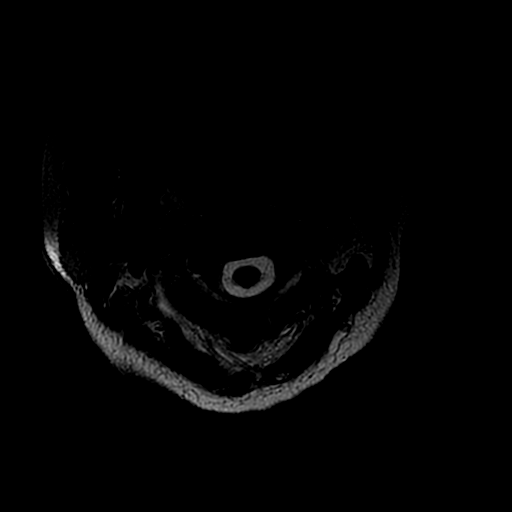

[Series 7: T1 · axial · non-contrast · 3.0mm · 0.39mm/px · z∈[-43,+16]mm · 3 of 27 slices shown (2 of 2)]
[im 4/27]
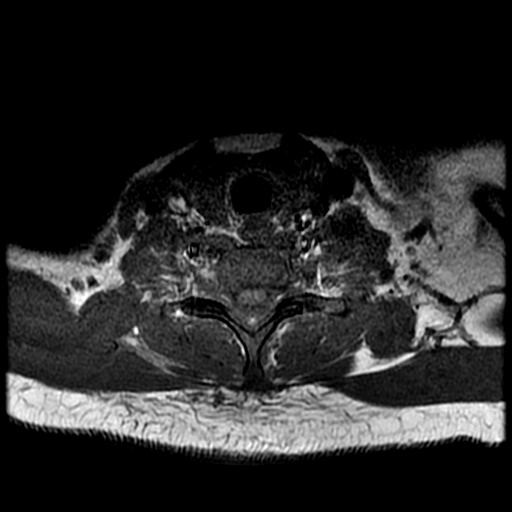
[im 15/27]
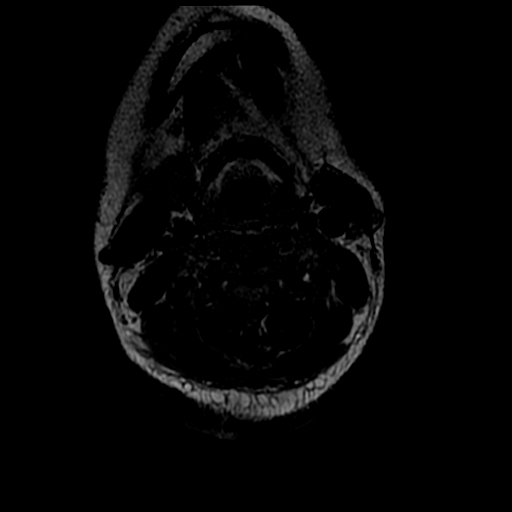
[im 23/27]
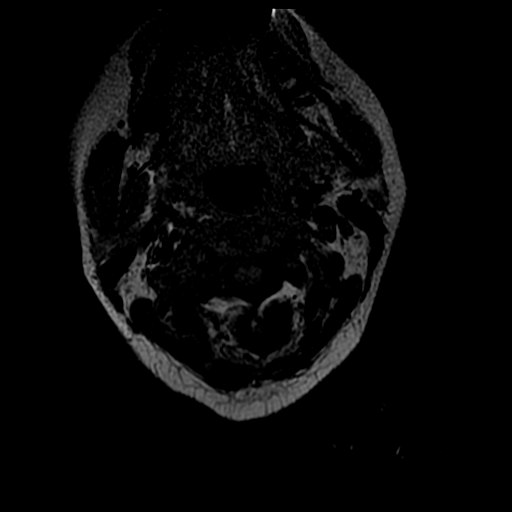

[18 of 48 positions shown; findings below may reference images not displayed]

FINDINGS: Alignment: Mild reversal of the normal cervical lordosis.

Vertebrae: Mild reversal of normal cervical lordosis. Vertebral
heights are maintained. No focal marrow edema to ski fracture
discitis/osteomyelitis.

Cord: Normal cord signal.

Posterior Fossa, vertebral arteries, paraspinal tissues: Visualized
vertebral artery flow voids are maintained. Posterior fossa is not
imaged. Posterior paraspinal edema and hemorrhage at the site of
recent surgery in the lower cervical spine.

Disc levels:

Posterior dorsal epidural fluid collection with peripheral
enhancement extending from approximately C2-C3 to T3-T4, measuring
up to approximately 5 mm thickness at C6-C7.

C2-C3: No significant disc protrusion, foraminal stenosis, or canal
stenosis.

C3-C4: Small posterior disc osteophyte complex. Dorsal epidural
fluid collection (as above). Resulting mild canal stenosis. Patent
foramina.

C4-C5: Posterior disc osteophyte complex. Dorsal epidural fluid
collection (as above). Resulting moderate to severe canal stenosis.
Bilateral facet and uncovertebral hypertrophy.

C5-C6: Postsurgical changes posteriorly. Posterior disc osteophyte
complex. Dorsal epidural fluid collection (as detailed above).
Resulting moderate to severe canal stenosis. Bilateral facet
uncovertebral hypertrophy with mild left foraminal stenosis.

C6-C7: Postsurgical changes posteriorly. Posterior disc osteophyte
complex. Dorsal epidural fluid collection (as above). Resulting
moderate to severe canal stenosis. Bilateral facet and uncovertebral
hypertrophy. Right uncovertebral hypertrophy with mild right
foraminal stenosis.

C7-T1: Dorsal epidural fluid collection (as above) results in mild
canal stenosis. Patent foramina.
IMPRESSION: Peripherally enhancing dorsal epidural fluid collection extending
from C2-C3 to T3-T4, measuring up to 6 mm in thickness at the site
of recent surgery at C6-C7. This collection most likely represents a
hematoma given recent surgery, but is sterility indeterminate by
imaging. When superimposed on degenerative change, resulting
moderate to severe canal stenosis at C4-C5, C5-C6, and C6-C7.

## 2021-04-19 IMAGING — CT CT ANGIO CHEST
2 of 6 series · 18 of 36 positions shown · IV contrast (agent unspecified)
Comparison: CTA chest [DATE].

CLINICAL DATA: 41-year-old female status post recent "neck
surgery". Increasing pain radiating down the arms and weakness.
Treated rectal cancer, recent port removal.

EXAM:
CT ANGIOGRAPHY CHEST WITH CONTRAST
TECHNIQUE: Multidetector CT imaging of the chest was performed using the
standard protocol during bolus administration of intravenous
contrast. Multiplanar CT image reconstructions and MIPs were
obtained to evaluate the vascular anatomy.

[Series 7: pe thins · axial · 0.69mm/px · z∈[+1171,+1435]mm · 17 of 419 slices shown]
[im 21/419  lung]
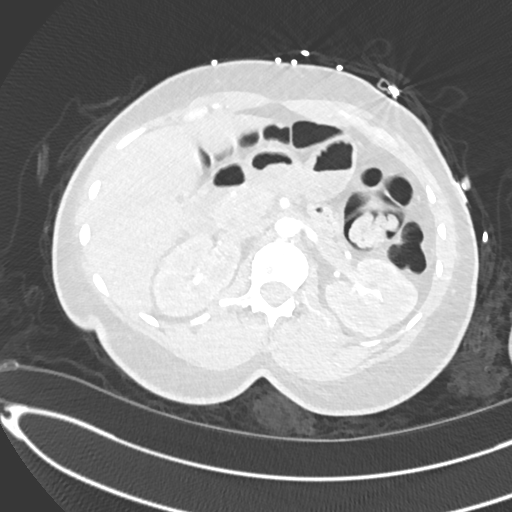
[im 42/419  mediastinal]
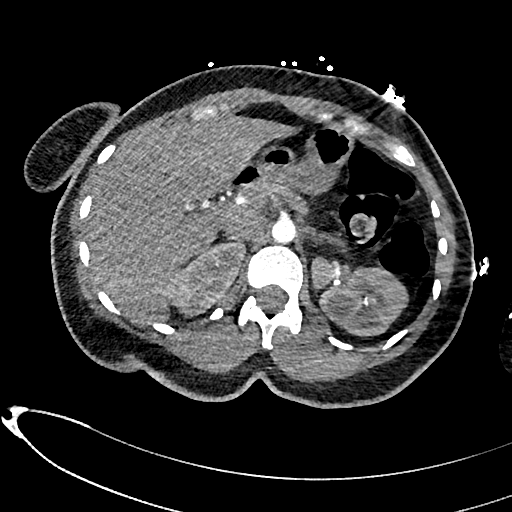
[im 63/419  lung]
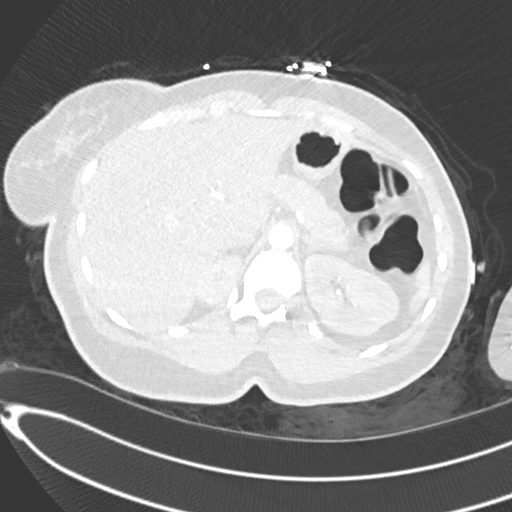
[im 84/419  mediastinal]
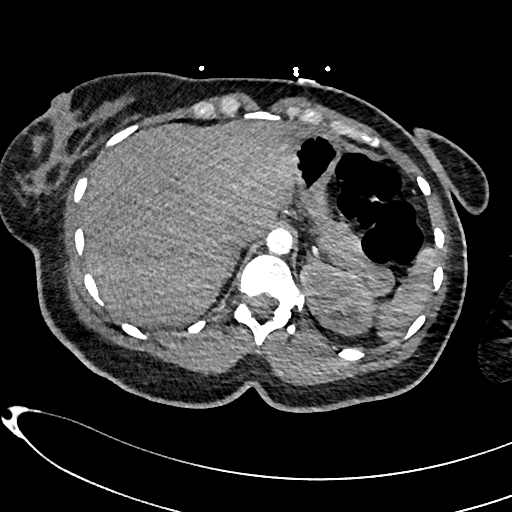
[im 126/419  lung]
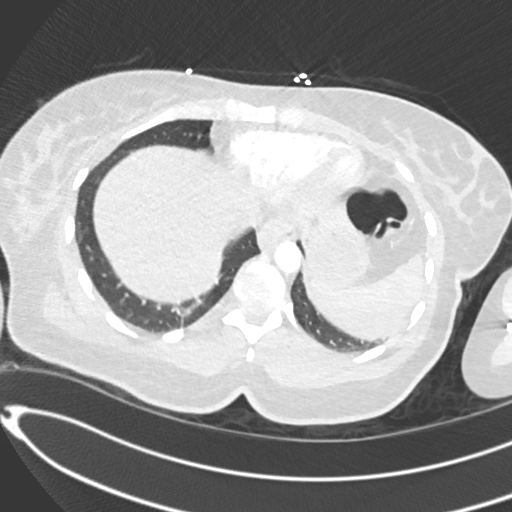
[im 147/419  mediastinal]
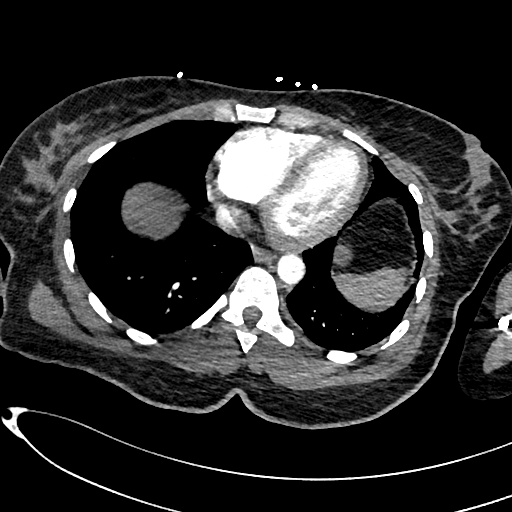
[im 168/419  lung]
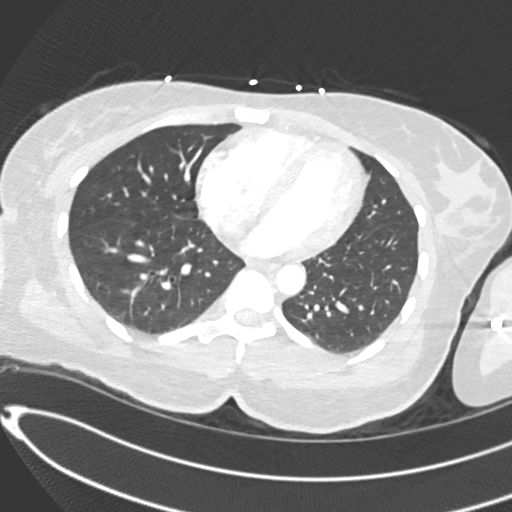
[im 189/419  mediastinal]
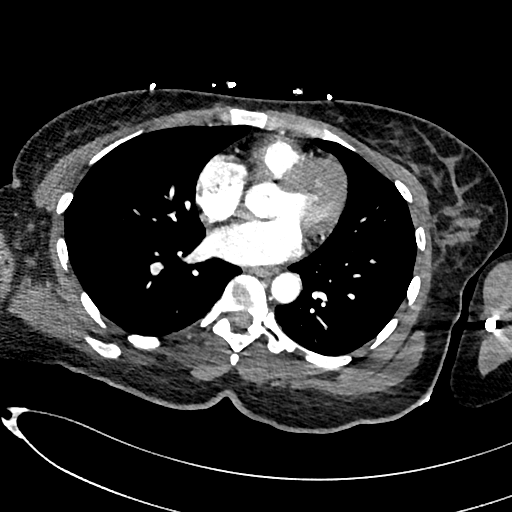
[im 210/419  lung]
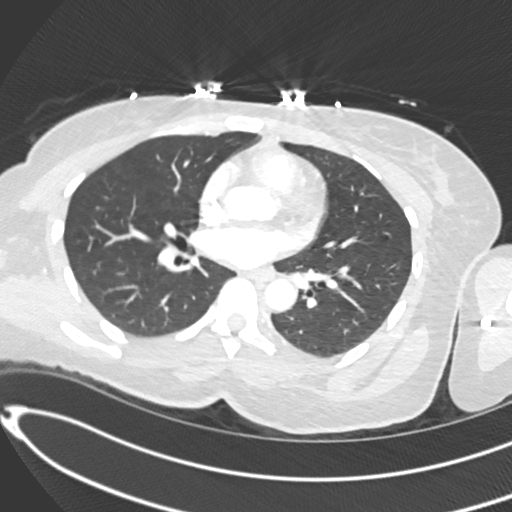
[im 230/419  mediastinal]
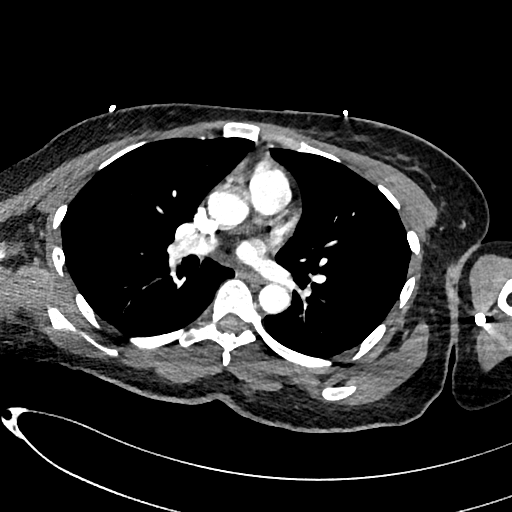
[im 251/419  lung]
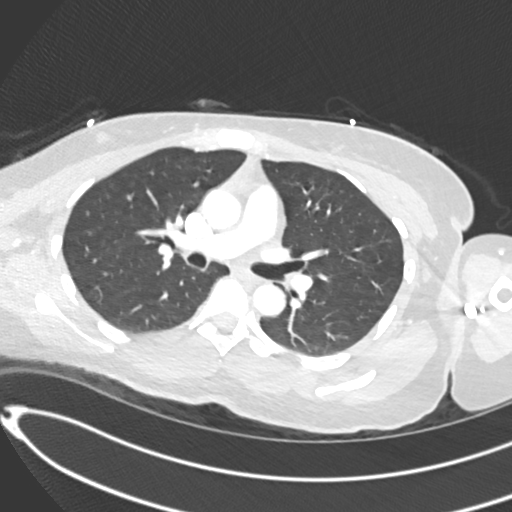
[im 272/419  mediastinal]
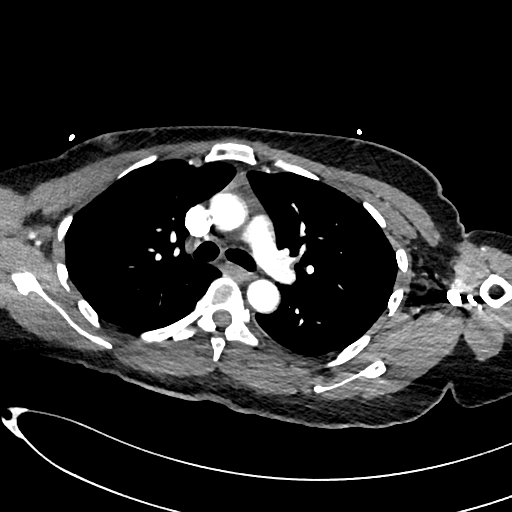
[im 293/419  lung]
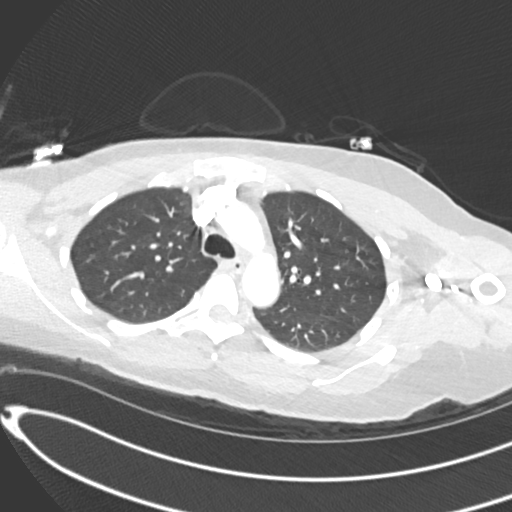
[im 335/419  mediastinal]
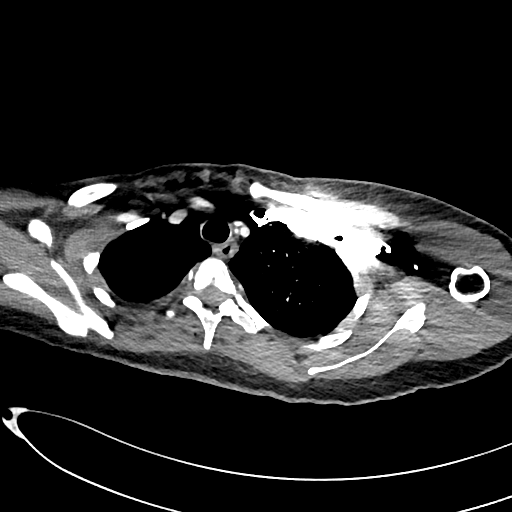
[im 356/419  lung]
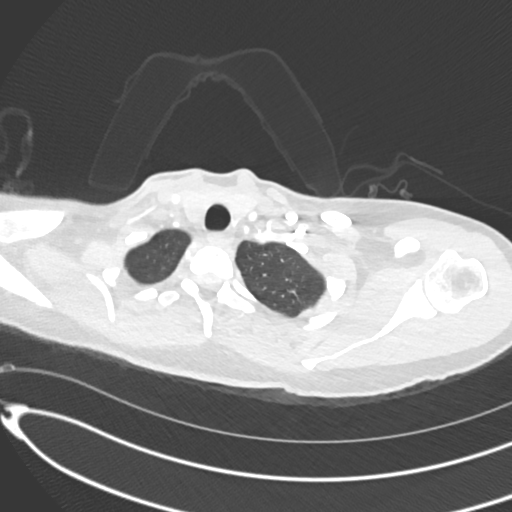
[im 377/419  mediastinal]
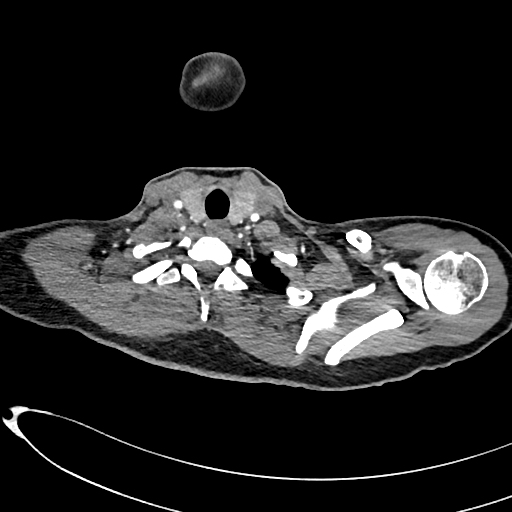
[im 398/419  lung]
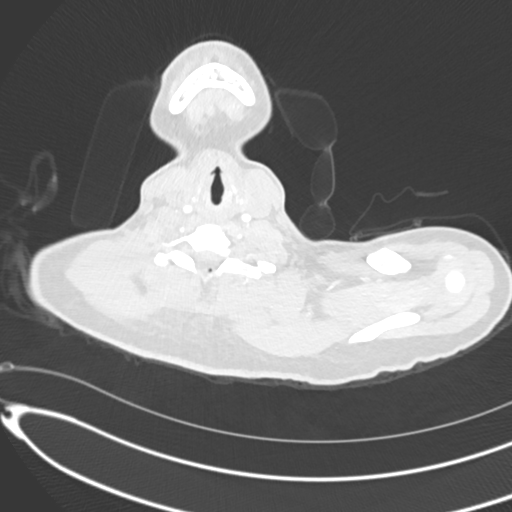

[Series 9: pe 2mm cor · coronal · 0.59mm/px · 1 of 110 slices shown]
[im 55/110  mediastinal]
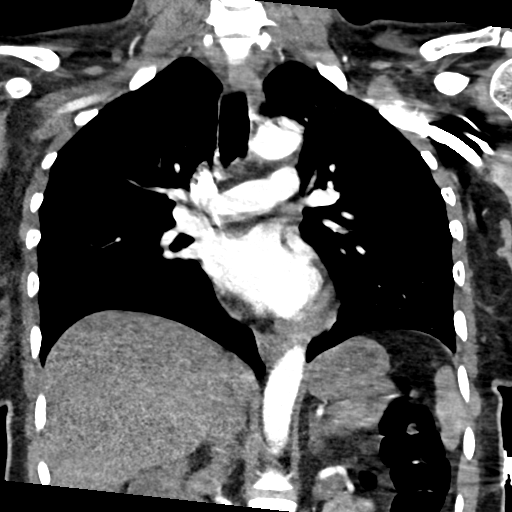

[18 of 36 positions shown; findings below may reference images not displayed]

RADIATION DOSE REDUCTION: This exam was performed according to the
departmental dose-optimization program which includes automated
exposure control, adjustment of the mA and/or kV according to
patient size and/or use of iterative reconstruction technique.

CONTRAST:  62mL OMNIPAQUE IOHEXOL 350 MG/ML SOLN
Cancer restaging CT Chest,
Abdomen, and Pelvis today are reported separately. [DATE].
FINDINGS: Cardiovascular: Adequate contrast bolus timing in the pulmonary
arterial tree.

No focal filling defect identified in the pulmonary arteries to
suggest acute pulmonary embolism.

Cardiac size remains within normal limits. No pericardial effusion.
Negative visible aorta.

Mediastinum/Nodes: Negative. No mediastinal mass or lymphadenopathy.

Lungs/Pleura: Stable and essentially clear. Minimal dependent
atelectasis in the lower lobes. No pleural effusion.

Upper Abdomen: Stable and negative visible upper abdominal viscera.

Musculoskeletal: Trace gas within the visible posterior cervical
spinal canal on series 10, image 74 is likely postoperative in this
setting. Visible cervical vertebrae appear intact. No cervical spine
hardware is visible. No acute or suspicious osseous lesion
identified. Recently removed right chest Port-A-Cath.

Review of the MIP images confirms the above findings.
IMPRESSION: 1. No evidence of acute pulmonary embolism. No acute finding in the
chest.
2. Small volume gas within the dorsal lower cervical spinal canal,
likely postoperative in this setting.

## 2021-04-19 SURGERY — POSTERIOR CERVICAL LAMINECTOMY
Anesthesia: General | Site: Neck

## 2021-04-19 MED ORDER — ACETAMINOPHEN 650 MG RE SUPP: 650.0000 mg | RECTAL | Status: DC | PRN

## 2021-04-19 MED ORDER — BACITRACIN ZINC 500 UNIT/GM EX OINT
TOPICAL_OINTMENT | CUTANEOUS | Status: AC
  Filled 2021-04-19: qty 28.35

## 2021-04-19 MED ORDER — THROMBIN 20000 UNITS EX SOLR
CUTANEOUS | Status: AC
  Filled 2021-04-19: qty 20000

## 2021-04-19 MED ORDER — PROPOFOL 10 MG/ML IV BOLUS
INTRAVENOUS | Status: AC
  Filled 2021-04-19: qty 20

## 2021-04-19 MED ORDER — DIAZEPAM 5 MG/ML IJ SOLN
5.0000 mg | Freq: Once | INTRAMUSCULAR | Status: AC
  Administered 2021-04-19: 5 mg via INTRAVENOUS
  Filled 2021-04-19: qty 2

## 2021-04-19 MED ORDER — ONDANSETRON HCL 4 MG/2ML IJ SOLN: 4.0000 mg | Freq: Four times a day (QID) | INTRAMUSCULAR | Status: DC | PRN

## 2021-04-19 MED ORDER — SUCCINYLCHOLINE CHLORIDE 200 MG/10ML IV SOSY
PREFILLED_SYRINGE | INTRAVENOUS | Status: DC | PRN
  Administered 2021-04-19: 100 mg via INTRAVENOUS

## 2021-04-19 MED ORDER — SODIUM CHLORIDE 0.9 % IV SOLN: INTRAVENOUS | Status: AC

## 2021-04-19 MED ORDER — DROSPIRENONE 4 MG PO TABS: 1.0000 | ORAL_TABLET | Freq: Every day | ORAL | Status: DC

## 2021-04-19 MED ORDER — PHENYLEPHRINE 40 MCG/ML (10ML) SYRINGE FOR IV PUSH (FOR BLOOD PRESSURE SUPPORT)
PREFILLED_SYRINGE | INTRAVENOUS | Status: DC | PRN
  Administered 2021-04-19: 80 ug via INTRAVENOUS
  Administered 2021-04-19: 40 ug via INTRAVENOUS

## 2021-04-19 MED ORDER — POTASSIUM CHLORIDE CRYS ER 20 MEQ PO TBCR: 20.0000 meq | EXTENDED_RELEASE_TABLET | Freq: Every day | ORAL | Status: DC

## 2021-04-19 MED ORDER — PROPOFOL 10 MG/ML IV BOLUS
INTRAVENOUS | Status: DC | PRN
  Administered 2021-04-19: 150 mg via INTRAVENOUS
  Administered 2021-04-19: 50 mg via INTRAVENOUS

## 2021-04-19 MED ORDER — MELATONIN 5 MG PO TABS: 5.0000 mg | ORAL_TABLET | Freq: Every evening | ORAL | Status: DC | PRN

## 2021-04-19 MED ORDER — MORPHINE SULFATE (PF) 4 MG/ML IV SOLN
4.0000 mg | Freq: Once | INTRAVENOUS | Status: AC
  Administered 2021-04-19: 4 mg via INTRAVENOUS
  Filled 2021-04-19: qty 1

## 2021-04-19 MED ORDER — LABETALOL HCL 5 MG/ML IV SOLN
INTRAVENOUS | Status: AC
  Filled 2021-04-19: qty 4

## 2021-04-19 MED ORDER — MENTHOL 3 MG MT LOZG: 1.0000 | LOZENGE | OROMUCOSAL | Status: DC | PRN

## 2021-04-19 MED ORDER — DEXAMETHASONE SODIUM PHOSPHATE 10 MG/ML IJ SOLN
INTRAMUSCULAR | Status: AC
  Filled 2021-04-19: qty 1

## 2021-04-19 MED ORDER — SODIUM CHLORIDE 0.9 % IV BOLUS (SEPSIS)
1000.0000 mL | Freq: Once | INTRAVENOUS | Status: AC
  Administered 2021-04-19: 1000 mL via INTRAVENOUS

## 2021-04-19 MED ORDER — ROCURONIUM BROMIDE 10 MG/ML (PF) SYRINGE
PREFILLED_SYRINGE | INTRAVENOUS | Status: AC
  Filled 2021-04-19: qty 10

## 2021-04-19 MED ORDER — CYCLOBENZAPRINE HCL 10 MG PO TABS
10.0000 mg | ORAL_TABLET | Freq: Three times a day (TID) | ORAL | Status: DC | PRN
  Administered 2021-04-19: 10 mg via ORAL
  Filled 2021-04-19: qty 1

## 2021-04-19 MED ORDER — LIDOCAINE 2% (20 MG/ML) 5 ML SYRINGE
INTRAMUSCULAR | Status: DC | PRN
  Administered 2021-04-19: 100 mg via INTRAVENOUS

## 2021-04-19 MED ORDER — HYDROCODONE-ACETAMINOPHEN 10-325 MG PO TABS: 1.0000 | ORAL_TABLET | ORAL | Status: DC | PRN

## 2021-04-19 MED ORDER — CHLORHEXIDINE GLUCONATE 0.12 % MT SOLN
OROMUCOSAL | Status: AC
  Filled 2021-04-19: qty 15

## 2021-04-19 MED ORDER — FENTANYL CITRATE (PF) 250 MCG/5ML IJ SOLN
INTRAMUSCULAR | Status: DC | PRN
  Administered 2021-04-19: 100 ug via INTRAVENOUS

## 2021-04-19 MED ORDER — CEFAZOLIN SODIUM-DEXTROSE 2-4 GM/100ML-% IV SOLN
INTRAVENOUS | Status: AC
  Filled 2021-04-19: qty 100

## 2021-04-19 MED ORDER — SODIUM CHLORIDE 0.9% FLUSH
3.0000 mL | Freq: Two times a day (BID) | INTRAVENOUS | Status: DC
  Administered 2021-04-19 (×2): 3 mL via INTRAVENOUS

## 2021-04-19 MED ORDER — MIDAZOLAM HCL 5 MG/5ML IJ SOLN
INTRAMUSCULAR | Status: DC | PRN
Start: 2021-04-19 — End: 2021-04-19
  Administered 2021-04-19: 1 mg via INTRAVENOUS

## 2021-04-19 MED ORDER — FENTANYL CITRATE PF 50 MCG/ML IJ SOSY
100.0000 ug | PREFILLED_SYRINGE | Freq: Once | INTRAMUSCULAR | Status: AC
  Administered 2021-04-19: 100 ug via INTRAVENOUS
  Filled 2021-04-19: qty 2

## 2021-04-19 MED ORDER — FENTANYL CITRATE PF 50 MCG/ML IJ SOSY
75.0000 ug | PREFILLED_SYRINGE | Freq: Once | INTRAMUSCULAR | Status: AC
  Administered 2021-04-19: 75 ug via INTRAVENOUS
  Filled 2021-04-19: qty 2

## 2021-04-19 MED ORDER — SUGAMMADEX SODIUM 200 MG/2ML IV SOLN
INTRAVENOUS | Status: DC | PRN
  Administered 2021-04-19: 200 mg via INTRAVENOUS

## 2021-04-19 MED ORDER — BACITRACIN ZINC 500 UNIT/GM EX OINT
TOPICAL_OINTMENT | CUTANEOUS | Status: DC | PRN
Start: 2021-04-19 — End: 2021-04-19
  Administered 2021-04-19: 1 via TOPICAL

## 2021-04-19 MED ORDER — LIDOCAINE 2% (20 MG/ML) 5 ML SYRINGE
INTRAMUSCULAR | Status: AC
  Filled 2021-04-19: qty 5

## 2021-04-19 MED ORDER — ONDANSETRON HCL 4 MG/2ML IJ SOLN
4.0000 mg | Freq: Once | INTRAMUSCULAR | Status: AC
  Administered 2021-04-19: 4 mg via INTRAVENOUS
  Filled 2021-04-19: qty 2

## 2021-04-19 MED ORDER — MAGIC MOUTHWASH
5.0000 mL | Freq: Four times a day (QID) | ORAL | Status: DC | PRN
  Filled 2021-04-19: qty 5

## 2021-04-19 MED ORDER — CEFAZOLIN SODIUM-DEXTROSE 2-3 GM-%(50ML) IV SOLR
INTRAVENOUS | Status: DC | PRN
  Administered 2021-04-19: 2 g via INTRAVENOUS

## 2021-04-19 MED ORDER — FENTANYL CITRATE (PF) 250 MCG/5ML IJ SOLN
INTRAMUSCULAR | Status: AC
  Filled 2021-04-19: qty 5

## 2021-04-19 MED ORDER — ONDANSETRON HCL 4 MG/2ML IJ SOLN
INTRAMUSCULAR | Status: AC
  Filled 2021-04-19: qty 2

## 2021-04-19 MED ORDER — SUCCINYLCHOLINE CHLORIDE 200 MG/10ML IV SOSY
PREFILLED_SYRINGE | INTRAVENOUS | Status: AC
  Filled 2021-04-19: qty 10

## 2021-04-19 MED ORDER — ONDANSETRON HCL 4 MG PO TABS: 8.0000 mg | ORAL_TABLET | Freq: Three times a day (TID) | ORAL | Status: DC | PRN

## 2021-04-19 MED ORDER — PANTOPRAZOLE SODIUM 40 MG PO TBEC: 40.0000 mg | DELAYED_RELEASE_TABLET | Freq: Every day | ORAL | Status: DC

## 2021-04-19 MED ORDER — MIDAZOLAM HCL 2 MG/2ML IJ SOLN
INTRAMUSCULAR | Status: AC
  Filled 2021-04-19: qty 2

## 2021-04-19 MED ORDER — ACETAMINOPHEN 10 MG/ML IV SOLN
INTRAVENOUS | Status: AC
  Filled 2021-04-19: qty 100

## 2021-04-19 MED ORDER — LABETALOL HCL 5 MG/ML IV SOLN
10.0000 mg | Freq: Once | INTRAVENOUS | Status: AC
  Administered 2021-04-19: 10 mg via INTRAVENOUS

## 2021-04-19 MED ORDER — HYDROCODONE-ACETAMINOPHEN 5-325 MG PO TABS
1.0000 | ORAL_TABLET | ORAL | Status: DC | PRN
  Administered 2021-04-19 – 2021-04-20 (×3): 1 via ORAL
  Filled 2021-04-19 (×3): qty 1

## 2021-04-19 MED ORDER — ONDANSETRON HCL 4 MG/2ML IJ SOLN
INTRAMUSCULAR | Status: DC | PRN
Start: 2021-04-19 — End: 2021-04-19
  Administered 2021-04-19: 4 mg via INTRAVENOUS

## 2021-04-19 MED ORDER — GADOBUTROL 1 MMOL/ML IV SOLN
7.0000 mL | Freq: Once | INTRAVENOUS | Status: AC | PRN
  Administered 2021-04-19: 7 mL via INTRAVENOUS

## 2021-04-19 MED ORDER — BUPIVACAINE HCL (PF) 0.25 % IJ SOLN
INTRAMUSCULAR | Status: AC
  Filled 2021-04-19: qty 30

## 2021-04-19 MED ORDER — PHENOL 1.4 % MT LIQD: 1.0000 | OROMUCOSAL | Status: DC | PRN

## 2021-04-19 MED ORDER — ONDANSETRON HCL 4 MG/2ML IJ SOLN: 4.0000 mg | Freq: Once | INTRAMUSCULAR | Status: DC | PRN

## 2021-04-19 MED ORDER — IOHEXOL 350 MG/ML SOLN
100.0000 mL | Freq: Once | INTRAVENOUS | Status: AC | PRN
Start: 2021-04-19 — End: 2021-04-19
  Administered 2021-04-19: 62 mL via INTRAVENOUS

## 2021-04-19 MED ORDER — HYDROMORPHONE HCL 1 MG/ML IJ SOLN: 1.0000 mg | INTRAMUSCULAR | Status: DC | PRN

## 2021-04-19 MED ORDER — PROCHLORPERAZINE MALEATE 10 MG PO TABS: 10.0000 mg | ORAL_TABLET | Freq: Four times a day (QID) | ORAL | Status: DC | PRN

## 2021-04-19 MED ORDER — LACTATED RINGERS IV SOLN: INTRAVENOUS | Status: DC | PRN

## 2021-04-19 MED ORDER — CEFAZOLIN SODIUM-DEXTROSE 1-4 GM/50ML-% IV SOLN
1.0000 g | Freq: Three times a day (TID) | INTRAVENOUS | Status: AC
  Administered 2021-04-19 – 2021-04-20 (×2): 1 g via INTRAVENOUS
  Filled 2021-04-19 (×2): qty 50

## 2021-04-19 MED ORDER — ACETAMINOPHEN 325 MG PO TABS: 650.0000 mg | ORAL_TABLET | ORAL | Status: DC | PRN

## 2021-04-19 MED ORDER — POTASSIUM CHLORIDE CRYS ER 20 MEQ PO TBCR
20.0000 meq | EXTENDED_RELEASE_TABLET | Freq: Every day | ORAL | Status: DC
  Administered 2021-04-19: 20 meq via ORAL
  Filled 2021-04-19: qty 1

## 2021-04-19 MED ORDER — 0.9 % SODIUM CHLORIDE (POUR BTL) OPTIME
TOPICAL | Status: DC | PRN
Start: 2021-04-19 — End: 2021-04-19
  Administered 2021-04-19: 1000 mL

## 2021-04-19 MED ORDER — HYDROMORPHONE HCL 1 MG/ML IJ SOLN
1.0000 mg | Freq: Once | INTRAMUSCULAR | Status: AC
  Administered 2021-04-19: 1 mg via INTRAVENOUS
  Filled 2021-04-19: qty 1

## 2021-04-19 MED ORDER — ACETAMINOPHEN 10 MG/ML IV SOLN
INTRAVENOUS | Status: DC | PRN
Start: 2021-04-19 — End: 2021-04-19
  Administered 2021-04-19: 1000 mg via INTRAVENOUS

## 2021-04-19 MED ORDER — THROMBIN 5000 UNITS EX SOLR: CUTANEOUS | Status: DC | PRN

## 2021-04-19 MED ORDER — THROMBIN 5000 UNITS EX SOLR
CUTANEOUS | Status: AC
  Filled 2021-04-19: qty 5000

## 2021-04-19 MED ORDER — ROCURONIUM BROMIDE 10 MG/ML (PF) SYRINGE
PREFILLED_SYRINGE | INTRAVENOUS | Status: DC | PRN
  Administered 2021-04-19: 60 mg via INTRAVENOUS

## 2021-04-19 MED ORDER — FENTANYL CITRATE (PF) 100 MCG/2ML IJ SOLN: 25.0000 ug | INTRAMUSCULAR | Status: DC | PRN

## 2021-04-19 MED ORDER — ONDANSETRON HCL 4 MG PO TABS: 4.0000 mg | ORAL_TABLET | Freq: Four times a day (QID) | ORAL | Status: DC | PRN

## 2021-04-19 MED ORDER — THROMBIN 20000 UNITS EX SOLR: CUTANEOUS | Status: DC | PRN

## 2021-04-19 MED ORDER — DROPERIDOL 2.5 MG/ML IJ SOLN
1.2500 mg | Freq: Once | INTRAMUSCULAR | Status: AC
  Administered 2021-04-19: 1.25 mg via INTRAVENOUS
  Filled 2021-04-19: qty 2

## 2021-04-19 MED ORDER — SODIUM CHLORIDE 0.9 % IV SOLN
250.0000 mL | INTRAVENOUS | Status: DC
  Administered 2021-04-19: 250 mL via INTRAVENOUS

## 2021-04-19 MED ORDER — DEXAMETHASONE SODIUM PHOSPHATE 10 MG/ML IJ SOLN
INTRAMUSCULAR | Status: DC | PRN
  Administered 2021-04-19: 10 mg via INTRAVENOUS

## 2021-04-19 MED ORDER — SODIUM CHLORIDE 0.9% FLUSH: 3.0000 mL | INTRAVENOUS | Status: DC | PRN

## 2021-04-19 MED ORDER — BUPIVACAINE HCL (PF) 0.25 % IJ SOLN
INTRAMUSCULAR | Status: DC | PRN
  Administered 2021-04-19: 20 mL

## 2021-04-19 SURGICAL SUPPLY — 55 items
ADH SKN CLS APL DERMABOND .7 (GAUZE/BANDAGES/DRESSINGS) ×1
APL SKNCLS STERI-STRIP NONHPOA (GAUZE/BANDAGES/DRESSINGS) ×1
BAG COUNTER SPONGE SURGICOUNT (BAG) ×2 IMPLANT
BAG DECANTER FOR FLEXI CONT (MISCELLANEOUS) ×1 IMPLANT
BAG SPNG CNTER NS LX DISP (BAG) ×1
BAND INSRT 18 STRL LF DISP RB (MISCELLANEOUS)
BAND RUBBER #18 3X1/16 STRL (MISCELLANEOUS) IMPLANT
BENZOIN TINCTURE PRP APPL 2/3 (GAUZE/BANDAGES/DRESSINGS) ×2 IMPLANT
BLADE CLIPPER SURG (BLADE) IMPLANT
BUR MATCHSTICK NEURO 3.0 LAGG (BURR) ×2 IMPLANT
CANISTER SUCT 3000ML PPV (MISCELLANEOUS) ×2 IMPLANT
CARTRIDGE OIL MAESTRO DRILL (MISCELLANEOUS) ×1 IMPLANT
DERMABOND ADVANCED (GAUZE/BANDAGES/DRESSINGS) ×1
DERMABOND ADVANCED .7 DNX12 (GAUZE/BANDAGES/DRESSINGS) ×1 IMPLANT
DIFFUSER DRILL AIR PNEUMATIC (MISCELLANEOUS) ×2 IMPLANT
DRAPE LAPAROTOMY 100X72 PEDS (DRAPES) ×2 IMPLANT
DRAPE MICROSCOPE LEICA (MISCELLANEOUS) IMPLANT
DURAPREP 26ML APPLICATOR (WOUND CARE) ×2 IMPLANT
ELECT REM PT RETURN 9FT ADLT (ELECTROSURGICAL) ×2
ELECTRODE REM PT RTRN 9FT ADLT (ELECTROSURGICAL) ×1 IMPLANT
EVACUATOR 1/8 PVC DRAIN (DRAIN) ×1 IMPLANT
GAUZE 4X4 16PLY ~~LOC~~+RFID DBL (SPONGE) ×1 IMPLANT
GAUZE SPONGE 4X4 12PLY STRL (GAUZE/BANDAGES/DRESSINGS) ×2 IMPLANT
GLOVE EXAM NITRILE XL STR (GLOVE) IMPLANT
GLOVE SURG LTX SZ9 (GLOVE) ×2 IMPLANT
GOWN STRL REUS W/ TWL LRG LVL3 (GOWN DISPOSABLE) IMPLANT
GOWN STRL REUS W/ TWL XL LVL3 (GOWN DISPOSABLE) ×1 IMPLANT
GOWN STRL REUS W/TWL 2XL LVL3 (GOWN DISPOSABLE) IMPLANT
GOWN STRL REUS W/TWL LRG LVL3 (GOWN DISPOSABLE)
GOWN STRL REUS W/TWL XL LVL3 (GOWN DISPOSABLE) ×6
HEMOSTAT POWDER KIT SURGIFOAM (HEMOSTASIS) ×1 IMPLANT
KIT BASIN OR (CUSTOM PROCEDURE TRAY) ×2 IMPLANT
KIT TURNOVER KIT B (KITS) ×2 IMPLANT
NDL SPNL 22GX3.5 QUINCKE BK (NEEDLE) ×1 IMPLANT
NEEDLE HYPO 22GX1.5 SAFETY (NEEDLE) ×1 IMPLANT
NEEDLE SPNL 22GX3.5 QUINCKE BK (NEEDLE) IMPLANT
NS IRRIG 1000ML POUR BTL (IV SOLUTION) ×2 IMPLANT
OIL CARTRIDGE MAESTRO DRILL (MISCELLANEOUS) ×2
PACK LAMINECTOMY NEURO (CUSTOM PROCEDURE TRAY) ×2 IMPLANT
PAD ARMBOARD 7.5X6 YLW CONV (MISCELLANEOUS) ×6 IMPLANT
PIN MAYFIELD SKULL DISP (PIN) ×2 IMPLANT
SPONGE SURGIFOAM ABS GEL 100 (HEMOSTASIS) ×1 IMPLANT
SPONGE SURGIFOAM ABS GEL SZ50 (HEMOSTASIS) ×1 IMPLANT
SPONGE T-LAP 4X18 ~~LOC~~+RFID (SPONGE) ×1 IMPLANT
STRIP CLOSURE SKIN 1/2X4 (GAUZE/BANDAGES/DRESSINGS) ×2 IMPLANT
SUT VIC AB 0 CT1 18XCR BRD8 (SUTURE) ×1 IMPLANT
SUT VIC AB 0 CT1 8-18 (SUTURE) ×2
SUT VIC AB 2-0 CT1 18 (SUTURE) ×2 IMPLANT
SUT VIC AB 3-0 SH 8-18 (SUTURE) ×2 IMPLANT
TAPE CLOTH SURG 4X10 WHT LF (GAUZE/BANDAGES/DRESSINGS) ×1 IMPLANT
TOWEL GREEN STERILE (TOWEL DISPOSABLE) ×2 IMPLANT
TOWEL GREEN STERILE FF (TOWEL DISPOSABLE) ×2 IMPLANT
TRAY FOLEY W/BAG SLVR 16FR (SET/KITS/TRAYS/PACK) ×2
TRAY FOLEY W/BAG SLVR 16FR ST (SET/KITS/TRAYS/PACK) IMPLANT
WATER STERILE IRR 1000ML POUR (IV SOLUTION) ×2 IMPLANT

## 2021-04-19 NOTE — Transfer of Care (Signed)
Immediate Anesthesia Transfer of Care Note ? ?Patient: Sharon Bennett ? ?Procedure(s) Performed: CERVICAL LAMINECTOMY FOR EPIDURAL HEMOATOMA (Neck) ? ?Patient Location: PACU ? ?Anesthesia Type:General ? ?Level of Consciousness: drowsy ? ?Airway & Oxygen Therapy: Patient Spontanous Breathing and Patient connected to nasal cannula oxygen ? ?Post-op Assessment: Report given to RN and Post -op Vital signs reviewed and stable ? ?Post vital signs: Reviewed and stable ? ?Last Vitals:  ?Vitals Value Taken Time  ?BP 123/93 04/19/21 1141  ?Temp    ?Pulse 110 04/19/21 1142  ?Resp 17 04/19/21 1142  ?SpO2 100 % 04/19/21 1142  ?Vitals shown include unvalidated device data. ? ?Last Pain:  ?Vitals:  ? 04/19/21 0944  ?TempSrc: Oral  ?PainSc: Asleep  ?   ? ?  ? ?Complications: No notable events documented. ?

## 2021-04-19 NOTE — ED Notes (Signed)
Patient transported to CT 

## 2021-04-19 NOTE — ED Notes (Signed)
Patient having pain when she walks.  States that she would have shooting pain when she steps from her foot up her body to her back.  No shortness of breath at this time.   ?

## 2021-04-19 NOTE — Progress Notes (Signed)
Pt belongings given to son prior to coming to short stay per pt. Pt did have cell phone at bedside and cellphone labeled with pt sticker and brought to PACU.  ?

## 2021-04-19 NOTE — ED Provider Notes (Signed)
?  Physical Exam  ?BP (!) 151/96   Pulse (!) 125   Temp 98.6 ?F (37 ?C) (Oral)   Resp (!) 24   SpO2 100%  ? ? ? ?Procedures  ?Procedures ? ?ED Course / MDM  ?  ?Medical Decision Making ?Amount and/or Complexity of Data Reviewed ?Labs: ordered. ?Radiology: ordered. ?ECG/medicine tests: ordered. ? ?Risk ?Prescription drug management. ? ? ?41F, hx of colorectal cancer, previous thrombus on Eliquis, now off, recent laminectomy with neurosurgery, here with severe neck pain and tachycardia, low grade fever at home. Getting an MRI to evaluate for possible post op infection. No ABX yet, labs look ok. ? ?Patient evaluated by neurosurgery and was subsequently taken to the OR for evacuation of a post-op epidural hematoma. ? ? ? ? ?  ?Regan Lemming, MD ?04/19/21 1306 ? ?

## 2021-04-19 NOTE — ED Provider Notes (Signed)
?[place] ?Provider Note ? ?CSN: [id number] ?Arrival date & time: 04/19/21 0328 ? ?Chief Complaint(s) ?Post-op Problem and Shoulder Pain ? ?HPI ?Sharon Bennett is a 41 y.o. female with a past medical history listed below including colorectal cancer status post chemo through right Port-A-Cath.  Her care was complicated due to catheter related thrombus and patient was started on Eliquis.  She recently had the Port-A-Cath removed 2 weeks ago.  ? ?Additionally patient had a posterior cervical laminectomy this past Monday.  She presents today for progressively worsening neck and upper back pain since the surgery.  Pain radiates down bilateral upper extremities.  She endorses weakness, stating that it might be related to the pain.  No bladder/bowel incontinence.  No lower extremity weakness but states that being upright makes the pain worse.  Is on narcotic medication which she takes every 4 hours and provides minimal relief.  She denies any known fevers or chills.  No coughing or congestion.  No chest pain or shortness of breath.  No nausea or vomiting.  ? ? ?Shoulder Pain ? ?Past Medical History ?Past Medical History:  ?Diagnosis Date  ? Colorectal cancer ([place])   ? Family history of brain cancer   ? Family history of breast cancer   ? Family history of pancreatic cancer   ? ?Patient Active Problem List  ? Diagnosis Date Noted  ? Abnormal cervical Papanicolaou smear 03/21/2021  ? 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 Bacteremia 12/15/2020  ? Elevated transaminase level 12/15/2020  ? Genetic testing 10/06/2020  ? Uterine fibroid 09/22/2020  ? Family history of pancreatic cancer 09/09/2020  ? Family history of brain cancer 09/09/2020  ? Family history of breast cancer 09/09/2020  ? Rectal cancer ([place]) 09/05/2020  ? ?Home Medication(s) ?Prior to Admission medications   ?Medication Sig Start Date End Date Taking? Authorizing Provider  ?acetaminophen (TYLENOL) 500 MG tablet Take 1,000  mg by mouth every 6 (six) hours as needed for mild pain or headache.    [provider]  ?apixaban (ELIQUIS) 5 MG TABS tablet Take 1 tablet (5 mg total) by mouth 2 (two) times daily. 02/09/21   Owens Shark, NP  ?apixaban (ELIQUIS) 5 MG TABS tablet Take 1 tablet (5 mg total) by mouth 2 (two) times daily. 02/09/21   Owens Shark, NP  ?aspirin-acetaminophen-caffeine (EXCEDRIN MIGRAINE) [phone] MG tablet Take 1 tablet by mouth every 6 (six) hours as needed for headache.    [provider]  ?cyclobenzaprine (FLEXERIL) 10 MG tablet Take 1 tablet (10 mg total) by mouth 2 (two) times daily as needed for muscle spasms. ?Patient not taking: Reported on 04/14/2021 03/20/21   Lennice Sites, DO  ?cyclobenzaprine (FLEXERIL) 10 MG tablet Take 1 tablet (10 mg total) by mouth 3 (three) times daily as needed for muscle spasms 04/17/21     ?HYDROcodone-acetaminophen (NORCO/VICODIN) 5-325 MG tablet Take 1 tablet by mouth every 4 (four) hours as needed for pain 04/17/21     ?lidocaine (LIDODERM) 5 % Place 1 patch onto the skin daily. Remove & Discard patch within 12 hours or as directed by MD ?Patient not taking: Reported on 04/14/2021 03/27/21   Nuala Alpha A, PA-C  ?lidocaine (LIDODERM) 5 % Place 1 patch onto the skin daily. Remove and discard patch within 12 hours or as directed by MD ?Patient not taking: Reported on 04/14/2021 03/27/21   Nuala Alpha A, PA-C  ?lidocaine-prilocaine (EMLA) cream Apply 1/2 tablespoon to port site and cover  with plastic wrap 2 hours prior to stick to numb site ?Patient not taking: Reported on 03/09/2021 09/23/20   Ladell Pier, MD  ?magic mouthwash SOLN Take 5 mLs by mouth 4 (four) times daily as needed for mouth pain. ?Patient not taking: Reported on 03/09/2021 01/05/21   Ladell Pier, MD  ?melatonin 5 MG TABS Take 5 mg by mouth at bedtime as needed (sleep).    [provider]  ?ondansetron (ZOFRAN) 8 MG tablet Take 1 tablet (8 mg total) by mouth every 8 (eight) hours  as needed for nausea or vomiting. ?Patient not taking: Reported on 04/14/2021 09/12/20   Ladell Pier, MD  ?pantoprazole (PROTONIX) 40 MG tablet TAKE 1 TABLET BY MOUTH EVERY DAY ?Patient not taking: Reported on 03/17/2021 10/27/20   Ladell Pier, MD  ?potassium chloride SA (KLOR-CON M) 20 MEQ tablet Take 1 tablet (20 mEq total) by mouth daily. 04/14/21   Owens Shark, NP  ?potassium chloride SA (KLOR-CON M) 20 MEQ tablet Take 1 tablet by mouth daily 04/14/21   Owens Shark, NP  ?prochlorperazine (COMPAZINE) 10 MG tablet Take 1 tablet (10 mg total) by mouth every 6 (six) hours as needed for nausea or vomiting. ?Patient not taking: Reported on 03/17/2021 09/12/20   Ladell Pier, MD  ?psyllium (METAMUCIL) 58.6 % powder Take 1 packet by mouth daily.    [provider]  ?SLYND 4 MG TABS Take 1 tablet by mouth daily. 11/28/20   [provider]  ?                                                                                                                                  ?Allergies ?Codeine, Dilaudid [hydromorphone], and Tegaderm ag mesh [silver] ? ?Review of Systems ?Review of Systems ?As noted in HPI ? ?Physical Exam ?Vital Signs  ?I have reviewed the triage vital signs ?BP (!) 151/96   Pulse (!) 125   Temp 98.6 ?F (37 ?C) (Oral)   Resp (!) 24   SpO2 100%  ? ?Physical Exam ?Vitals reviewed.  ?Constitutional:   ?   General: She is not in acute distress. ?   Appearance: She is well-developed. She is not diaphoretic.  ?HENT:  ?   Head: Normocephalic and atraumatic.  ?   Nose: Nose normal.  ?Eyes:  ?   General: No scleral icterus.    ?   Right eye: No discharge.     ?   Left eye: No discharge.  ?   Conjunctiva/sclera: Conjunctivae normal.  ?   Pupils: Pupils are equal, round, and reactive to light.  ?Neck:  ? ?Cardiovascular:  ?   Rate and Rhythm: Regular rhythm. Tachycardia present.  ?   Heart sounds: No murmur heard. ?  No friction rub. No gallop.  ?Pulmonary:  ?   Effort: Pulmonary effort is  normal. No respiratory distress.  ?   Breath sounds:  Normal breath sounds. No stridor. No rales.  ?Abdominal:  ?   General: There is no distension.  ?   Palpations: Abdomen is soft.  ?   Tenderness: There is no abdominal tenderness.  ?Musculoskeletal:     ?   General: No tenderness.  ?   Right hand: Decreased strength (limited by pain). Normal sensation. Normal pulse.  ?   Left hand: Decreased strength (limited by pain). Normal sensation. Normal pulse.  ?   Cervical back: Rigidity present. Muscular tenderness present. Decreased range of motion.  ?Skin: ?   General: Skin is warm and dry.  ?   Findings: No erythema or rash.  ?Neurological:  ?   Mental Status: She is alert and oriented to person, place, and time.  ? ? ?ED Results and Treatments ?Labs ?(all labs ordered are listed, but only abnormal results are displayed) ?Labs Reviewed  ?COMPREHENSIVE METABOLIC PANEL - Abnormal; Notable for the following components:  ?    Result Value  ? Potassium 3.3 (*)   ? Glucose, Bld 105 (*)   ? BUN 5 (*)   ? All other components within normal limits  ?CBC WITH DIFFERENTIAL/PLATELET - Abnormal; Notable for the following components:  ? Hemoglobin 11.6 (*)   ? Monocytes Absolute 1.1 (*)   ? All other components within normal limits  ?APTT - Abnormal; Notable for the following components:  ? aPTT 38 (*)   ? All other components within normal limits  ?I-STAT CHEM 8, ED - Abnormal; Notable for the following components:  ? Potassium 3.4 (*)   ? BUN 5 (*)   ? Creatinine, Ser 0.40 (*)   ? Glucose, Bld 109 (*)   ? Calcium, Ion 1.07 (*)   ? All other components within normal limits  ?CULTURE, BLOOD (ROUTINE X 2)  ?CULTURE, BLOOD (ROUTINE X 2)  ?LACTIC ACID, PLASMA  ?PROTIME-INR  ?LACTIC ACID, PLASMA  ?URINALYSIS, ROUTINE W REFLEX MICROSCOPIC  ?CBG MONITORING, ED  ?I-STAT BETA HCG BLOOD, ED ([facility], [facility], [facility] ONLY)  ?                                                                                                                       ?EKG ? EKG  Interpretation ? ?Date/Time:  Wednesday April 19 2021 05:11:55 EDT ?Ventricular Rate:  130 ?PR Interval:  150 ?QRS Duration: 85 ?QT Interval:  307 ?QTC Calculation: 452 ?R Axis:   106 ?Text Interpretation:

## 2021-04-19 NOTE — Brief Op Note (Signed)
04/19/2021 ? ?11:29 AM ? ?PATIENT:  Sharon Bennett  41 y.o. female ? ?PRE-OPERATIVE DIAGNOSIS:  hematoma ? ?POST-OPERATIVE DIAGNOSIS:  hematoma ? ?PROCEDURE:  Procedure(s): ?CERVICAL LAMINECTOMY FOR EPIDURAL HEMOATOMA (N/A) ? ?SURGEON:  Surgeon(s) and Role: ?   Earnie Larsson, MD - Primary ? ?PHYSICIAN ASSISTANT:  ? ?ASSISTANTS:   ? ?ANESTHESIA:   general ? ?EBL:  100 mL  ? ?BLOOD ADMINISTERED:none ? ?DRAINS: (Medium) Hemovact drain(s) in the deep wound space with  Suction Open  ? ?LOCAL MEDICATIONS USED:  MARCAINE    ? ?SPECIMEN:  No Specimen ? ?DISPOSITION OF SPECIMEN:  N/A ? ?COUNTS:  YES ? ?TOURNIQUET:  * No tourniquets in log * ? ?DICTATION: .Dragon Dictation ? ?PLAN OF CARE: Admit for overnight observation ? ?PATIENT DISPOSITION:  PACU - hemodynamically stable. ?  ?Delay start of Pharmacological VTE agent (>24hrs) due to surgical blood loss or risk of bleeding: yes ? ?

## 2021-04-19 NOTE — ED Notes (Signed)
Pt belongings given to son prior to going to short stay.  ?

## 2021-04-19 NOTE — Progress Notes (Signed)
Orthopedic Tech Progress Note ?Patient Details:  ?Sharon Bennett ?1980-05-28 ?[id number] ? ?RN stated patient has SOFT COLLAR  ? ?Patient ID: Sharon Bennett, female   DOB: 15-Aug-1980, 41 y.o.   MRN: [id number] ? ?Janit Pagan ?04/19/2021, 1:47 PM ? ?

## 2021-04-19 NOTE — Anesthesia Postprocedure Evaluation (Signed)
Anesthesia Post Note ? ?Patient: Sharon Bennett ? ?Procedure(s) Performed: CERVICAL LAMINECTOMY FOR EPIDURAL HEMOATOMA (Neck) ? ?  ? ?Patient location during evaluation: PACU ?Anesthesia Type: General ?Level of consciousness: awake and alert ?Pain management: pain level controlled ?Vital Signs Assessment: post-procedure vital signs reviewed and stable ?Respiratory status: spontaneous breathing, nonlabored ventilation, respiratory function stable and patient connected to nasal cannula oxygen ?Cardiovascular status: blood pressure returned to baseline and stable ?Postop Assessment: no apparent nausea or vomiting ?Anesthetic complications: no ? ? ?No notable events documented. ? ?Last Vitals:  ?Vitals:  ? 04/19/21 1235 04/19/21 1253  ?BP: (!) 124/91 (!) 134/94  ?Pulse: 98 91  ?Resp: 20 20  ?Temp: 36.8 ?C 36.6 ?C  ?SpO2: 99% 100%  ?  ?Last Pain:  ?Vitals:  ? 04/19/21 1253  ?TempSrc: Oral  ?PainSc:   ? ? ?  ?  ?  ?  ?  ?  ? ?[place] ? ? ? ? ?

## 2021-04-19 NOTE — Anesthesia Procedure Notes (Signed)
Procedure Name: Intubation ?Date/Time: 04/19/2021 10:15 AM ?Performed by: Colin Benton, CRNA ?Pre-anesthesia Checklist: Patient identified, Emergency Drugs available, Suction available and Patient being monitored ?Patient Re-evaluated:Patient Re-evaluated prior to induction ?Oxygen Delivery Method: Circle system utilized ?Preoxygenation: Pre-oxygenation with 100% oxygen ?Induction Type: IV induction ?Ventilation: Mask ventilation without difficulty ?Laryngoscope Size: Glidescope and 3 ?Grade View: Grade I ?Tube type: Oral ?Tube size: 7.0 mm ?Number of attempts: 1 ?Airway Equipment and Method: Rigid stylet and Video-laryngoscopy ?Placement Confirmation: ETT inserted through vocal cords under direct vision, positive ETCO2 and breath sounds checked- equal and bilateral ?Secured at: 23 cm ?Tube secured with: Tape ?Dental Injury: Teeth and Oropharynx as per pre-operative assessment  ?Difficulty Due To: Difficulty was anticipated and Difficult Airway- due to reduced neck mobility ?Comments: Elective glidescope intubation d/t posterior cervical hematoma. ? ? ? ? ?

## 2021-04-19 NOTE — ED Triage Notes (Signed)
Pt had neck surgery on Monday; reports pain has progressively gotten worse down bilateral arms with increased weakness  ?

## 2021-04-19 NOTE — H&P (Signed)
Sharon Bennett is an 41 y.o. female.   ?Chief Complaint: Pain ?HPI: 41 year old female recently status post C6-7 posterior cervical laminotomy and foraminotomy and microdiscectomy.  Patient with acutely worsening posterior cervical pain with radiating pain and paresthesias into her upper extremities.  Some feelings of subjective weakness.  Work-up demonstrates evidence of a dorsal postoperative epidural hematoma with some stenosis.  Patient presents emergently for redo cervical decompressive surgery and evacuation of hematoma. ? ?Past Medical History:  ?Diagnosis Date  ? Colorectal cancer ([place])   ? Family history of brain cancer   ? Family history of breast cancer   ? Family history of pancreatic cancer   ? ? ?Past Surgical History:  ?Procedure Laterality Date  ? IR IMAGING GUIDED PORT INSERTION  09/09/2020  ? IR REMOVAL TUN ACCESS W/ PORT W/O FL MOD SED  04/04/2021  ? LEEP    ? 2015  ? ? ?Family History  ?Problem Relation Age of Onset  ? Hypertension Mother   ? Heart failure Father   ? Hypertension Father   ? Pancreatic cancer Maternal Grandfather   ?     dx 36s  ? Prostate cancer Maternal Grandfather   ? Brain cancer Paternal Grandmother   ?     dx >50  ? Breast cancer Other 82  ?     MGF's sister  ? Endometrial cancer Neg Hx   ? Ovarian cancer Neg Hx   ? ?Social History:  reports that she has never smoked. She has never used smokeless tobacco. She reports current alcohol use. She reports that she does not currently use drugs. ? ?Allergies:  ?Allergies  ?Allergen Reactions  ? Codeine Nausea And Vomiting  ? Dilaudid [Hydromorphone] Nausea And Vomiting  ? Tegaderm Ag Mesh [Silver] Rash  ?  Allergic reaction to Tegaderm dressing  ? ? ?(Not in a hospital admission) ? ? ?Results for orders placed or performed during the hospital encounter of 04/19/21 (from the past 48 hour(s))  ?CBG monitoring, ED     Status: None  ? Collection Time: 04/19/21  3:43 AM  ?Result Value Ref Range  ? Glucose-Capillary 95 70 - 99 mg/dL  ?   Comment: Glucose reference range applies only to samples taken after fasting for at least 8 hours.  ? Comment 1 Notify RN   ? Comment 2 Document in Chart   ?Lactic acid, plasma     Status: None  ? Collection Time: 04/19/21  4:21 AM  ?Result Value Ref Range  ? Lactic Acid, Venous 0.9 0.5 - 1.9 mmol/L  ?  Comment: Performed at [facility], [place] [address] [place], [place] [address]  ?I-Stat beta hCG blood, ED     Status: None  ? Collection Time: 04/19/21  5:15 AM  ?Result Value Ref Range  ? I-stat hCG, quantitative <5.0 <5 mIU/mL  ? Comment 3          ?  Comment:   GEST. AGE      CONC.  (mIU/mL) ?  <=1 WEEK        5 - 50 ?    2 WEEKS       50 - 500 ?    3 WEEKS       100 - 10,000 ?    4 WEEKS     1,000 - 30,000 ?       ?FEMALE AND NON-PREGNANT FEMALE: ?    LESS THAN 5 mIU/mL ?  ?I-Stat Chem 8, ED  Status: Abnormal  ? Collection Time: 04/19/21  5:17 AM  ?Result Value Ref Range  ? Sodium 138 135 - 145 mmol/L  ? Potassium 3.4 (L) 3.5 - 5.1 mmol/L  ? Chloride 105 98 - 111 mmol/L  ? BUN 5 (L) 6 - 20 mg/dL  ? Creatinine, Ser 0.40 (L) 0.44 - 1.00 mg/dL  ? Glucose, Bld 109 (H) 70 - 99 mg/dL  ?  Comment: Glucose reference range applies only to samples taken after fasting for at least 8 hours.  ? Calcium, Ion 1.07 (L) 1.15 - 1.40 mmol/L  ? TCO2 23 22 - 32 mmol/L  ? Hemoglobin 12.2 12.0 - 15.0 g/dL  ? HCT 36.0 36.0 - 46.0 %  ?Comprehensive metabolic panel     Status: Abnormal  ? Collection Time: 04/19/21  5:20 AM  ?Result Value Ref Range  ? Sodium 137 135 - 145 mmol/L  ? Potassium 3.3 (L) 3.5 - 5.1 mmol/L  ? Chloride 101 98 - 111 mmol/L  ? CO2 22 22 - 32 mmol/L  ? Glucose, Bld 105 (H) 70 - 99 mg/dL  ?  Comment: Glucose reference range applies only to samples taken after fasting for at least 8 hours.  ? BUN 5 (L) 6 - 20 mg/dL  ? Creatinine, Ser 0.47 0.44 - 1.00 mg/dL  ? Calcium 8.9 8.9 - 10.3 mg/dL  ? Total Protein 7.5 6.5 - 8.1 g/dL  ? Albumin 3.7 3.5 - 5.0 g/dL  ? AST 26 15 - 41 U/L  ? ALT 31 0 - 44 U/L  ? Alkaline  Phosphatase 62 38 - 126 U/L  ? Total Bilirubin 0.5 0.3 - 1.2 mg/dL  ? GFR, Estimated >60 >60 mL/min  ?  Comment: (NOTE) ?Calculated using the CKD-EPI Creatinine Equation (2021) ?  ? Anion gap 14 5 - 15  ?  Comment: Performed at [facility], [place] [address], [place], [place] [address]  ?CBC WITH DIFFERENTIAL     Status: Abnormal  ? Collection Time: 04/19/21  5:20 AM  ?Result Value Ref Range  ? WBC 8.6 4.0 - 10.5 K/uL  ? RBC 3.99 3.87 - 5.11 MIL/uL  ? Hemoglobin 11.6 (L) 12.0 - 15.0 g/dL  ? HCT 36.2 36.0 - 46.0 %  ? MCV 90.7 80.0 - 100.0 fL  ? MCH 29.1 26.0 - 34.0 pg  ? MCHC 32.0 30.0 - 36.0 g/dL  ? RDW 14.5 11.5 - 15.5 %  ? Platelets 244 150 - 400 K/uL  ? nRBC 0.0 0.0 - 0.2 %  ? Neutrophils Relative % 74 %  ? Neutro Abs 6.5 1.7 - 7.7 K/uL  ? Lymphocytes Relative 11 %  ? Lymphs Abs 0.9 0.7 - 4.0 K/uL  ? Monocytes Relative 13 %  ? Monocytes Absolute 1.1 (H) 0.1 - 1.0 K/uL  ? Eosinophils Relative 0 %  ? Eosinophils Absolute 0.0 0.0 - 0.5 K/uL  ? Basophils Relative 1 %  ? Basophils Absolute 0.0 0.0 - 0.1 K/uL  ? Immature Granulocytes 1 %  ? Abs Immature Granulocytes 0.04 0.00 - 0.07 K/uL  ?  Comment: Performed at [facility], [place] [address] [place],  [address]  ?Protime-INR     Status: None  ? Collection Time: 04/19/21  5:20 AM  ?Result Value Ref Range  ? Prothrombin Time 15.0 11.4 - 15.2 seconds  ? INR 1.2 0.8 - 1.2  ?  Comment: (NOTE) ?INR goal varies based on device and disease states. ?Performed at [facility]  Lab, [address], [place], [place] ?[address] ?  ?APTT     Status: Abnormal  ? Collection Time: 04/19/21  5:20 AM  ?Result Value Ref Range  ? aPTT 38 (H) 24 - 36 seconds  ?  Comment:        ?IF BASELINE aPTT IS ELEVATED, ?SUGGEST PATIENT RISK ASSESSMENT ?BE USED TO DETERMINE APPROPRIATE ?ANTICOAGULANT THERAPY. ?Performed at [facility], [place] [address] [place],  [address] ?  ? ?CT Angio Chest PE W and/or Wo Contrast ? ?Result Date: 04/19/2021 ?CLINICAL DATA:  41 year old  female status post recent "neck surgery". Increasing pain radiating down the arms and weakness. Treated rectal cancer, recent port removal. EXAM: CT ANGIOGRAPHY CHEST WITH CONTRAST TECHNIQUE: Multidetector CT imaging of the chest was performed using the standard protocol during bolus administration of intravenous contrast. Multiplanar CT image reconstructions and MIPs were obtained to evaluate the vascular anatomy. RADIATION DOSE REDUCTION: This exam was performed according to the departmental dose-optimization program which includes automated exposure control, adjustment of the mA and/or kV according to patient size and/or use of iterative reconstruction technique. CONTRAST:  59m OMNIPAQUE IOHEXOL 350 MG/ML SOLN COMPARISON:  CTA chest 03/20/2021. Cancer restaging CT Chest, Abdomen, and Pelvis today are reported separately. 04/07/2021. FINDINGS: Cardiovascular: Adequate contrast bolus timing in the pulmonary arterial tree. No focal filling defect identified in the pulmonary arteries to suggest acute pulmonary embolism. Cardiac size remains within normal limits. No pericardial effusion. Negative visible aorta. Mediastinum/Nodes: Negative. No mediastinal mass or lymphadenopathy. Lungs/Pleura: Stable and essentially clear. Minimal dependent atelectasis in the lower lobes. No pleural effusion. Upper Abdomen: Stable and negative visible upper abdominal viscera. Musculoskeletal: Trace gas within the visible posterior cervical spinal canal on series 10, image 74 is likely postoperative in this setting. Visible cervical vertebrae appear intact. No cervical spine hardware is visible. No acute or suspicious osseous lesion identified. Recently removed right chest Port-A-Cath. Review of the MIP images confirms the above findings. IMPRESSION: 1. No evidence of acute pulmonary embolism. No acute finding in the chest. 2. Small volume gas within the dorsal lower cervical spinal canal, likely postoperative in this setting.  Electronically Signed   By: HGenevie AnnM.D.   On: 04/19/2021 07:39  ? ?DG Chest Port 1 View ? ?Result Date: 04/19/2021 ?CLINICAL DATA:  Evaluate for abnormality.  Questionable sepsis. EXAM: PORTABLE CHEST 1 VIEW COMPA

## 2021-04-19 NOTE — Anesthesia Preprocedure Evaluation (Signed)
Anesthesia Evaluation  ?Patient identified by MRN, date of birth, ID band ?Patient awake ? ? ? ?Reviewed: ?Allergy & Precautions, NPO status , Patient's Chart, lab work & pertinent test results ? ?Airway ?Mallampati: II ? ?TM Distance: >3 FB ?Neck ROM: Limited ? ? ? Dental ? ?(+) Teeth Intact, Dental Advisory Given ?  ?Pulmonary ?neg pulmonary ROS,  ?  ?Pulmonary exam normal ?breath sounds clear to auscultation ? ? ? ? ? ? Cardiovascular ?negative cardio ROS ?Normal cardiovascular exam ?Rhythm:Regular Rate:Normal ? ? ?  ?Neuro/Psych ?Hematoma following recent status post C6-7 posterior cervical laminotomy and foraminotomy and microdiscectomy ?  ? GI/Hepatic ?Neg liver ROS, GERD  Medicated,H/o colorectal cancer ? ?  ?Endo/Other  ?negative endocrine ROS ? Renal/GU ?negative Renal ROS  ? ?  ?Musculoskeletal ?negative musculoskeletal ROS ?(+)  ? Abdominal ?  ?Peds ? Hematology ? ?(+) Blood dyscrasia (Eliquis), anemia ,   ?Anesthesia Other Findings ?Day of surgery medications reviewed with the patient. ? Reproductive/Obstetrics ? ?  ? ? ? ? ? ? ? ? ? ? ? ? ? ?  ?  ? ? ? ? ? ? ? ? ?Anesthesia Physical ?Anesthesia Plan ? ?ASA: 2 and emergent ? ?Anesthesia Plan: General  ? ?Post-op Pain Management: Ofirmev IV (intra-op)*  ? ?Induction: Intravenous ? ?PONV Risk Score and Plan: 3 and Midazolam, Dexamethasone and Ondansetron ? ?Airway Management Planned: Oral ETT and Video Laryngoscope Planned ? ?Additional Equipment:  ? ?Intra-op Plan:  ? ?Post-operative Plan: Extubation in OR ? ?Informed Consent: I have reviewed the patients History and Physical, chart, labs and discussed the procedure including the risks, benefits and alternatives for the proposed anesthesia with the patient or authorized representative who has indicated his/her understanding and acceptance.  ? ? ? ?Dental advisory given ? ?Plan Discussed with: CRNA ? ?Anesthesia Plan Comments:   ? ? ? ? ? ? ?Anesthesia Quick Evaluation ? ?

## 2021-04-19 NOTE — Op Note (Signed)
Date of procedure: 04/19/2021 ? ?Date of dictation: Same ? ?Service: Neurosurgery ? 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 ?Postoperative diagnosis: Same ? ?Procedure Name: Reexploration of right C6-7 laminotomy with C6 and C5 decompressive laminectomy and partial C7 decompressive laminectomy and evacuation of hemorrhage ? ?Surgeon:Lakiyah Arntson A.Norberta Stobaugh, M.D. ? ?Asst. Surgeon: None ? ?Anesthesia: General ? ?Indication: 41 year old female with history of colorectal cancer previously on anticoagulation who recently underwent right-sided C6-7 laminotomy foraminotomy microdiscectomy.  She presents now with postoperative pain and increasing paresthesias, dysesthesias and numbness in the both upper extremities with some subjective weakness.  Patient has an MRI scan which demonstrates a large postoperative dorsal epidural hematoma with compression.  Patient presents now for surgical reexploration and evacuation. ? ?Operative note: After induction of anesthesia, patient position prone onto bolsters with her head fixed in [place] pin headrest.  Patient's posterior cervical region prepped and draped sterilely.  Incision reopened.  Dissection performed on the right side.  Retractor placed.  Previous laminotomy site was dissected free.  There was some overlying Gelfoam which was removed.  There was some bleeding from the lateral aspect of the facet joint of C5-6 which was controlled with Bovie hemostasis.  Attention was then placed back into the spinal canal.  The laminotomy site was evacuated.  The dura was uncovered.  There was some epidural clot mixed with then some dorsal epidural fat which was resected.  I completed a laminectomy on the right side at C6 removing the whole hemilamina and extended up into the C5 level all the way up to the inferior aspect of the C4 lamina.  Epidural clot was resected.  I also inferiorly extended the laminectomy of C7 and removed epidural clot there  as well.  I took the decompression past midline and was able to ensure there was no further compressive clot present.  I did not find any evidence of active epidural bleeding.  There was no evidence of injury to the thecal sac, nerve root or spinal cord.  Wound was then irrigated.  Surgifoam was used for hemostasis.  A medium epidural Hemovac drain was placed.  Wound is then closed in layers with Vicryl sutures.  Steri-Strips and sterile dressing were applied.  No apparent complications.  Patient returns to the recovery room postop. ?04/19/2021 ?

## 2021-04-20 ENCOUNTER — Telehealth: Payer: Self-pay | Admitting: Nurse Practitioner

## 2021-04-20 ENCOUNTER — Encounter (HOSPITAL_COMMUNITY): Payer: Self-pay | Admitting: Neurosurgery

## 2021-04-20 ENCOUNTER — Ambulatory Visit (HOSPITAL_BASED_OUTPATIENT_CLINIC_OR_DEPARTMENT_OTHER): Payer: 59 | Admitting: Family Medicine

## 2021-04-20 DIAGNOSIS — G9761 Postprocedural hematoma of a nervous system organ or structure following a nervous system procedure: Secondary | ICD-10-CM | POA: Diagnosis not present

## 2021-04-20 DIAGNOSIS — Z85038 Personal history of other malignant neoplasm of large intestine: Secondary | ICD-10-CM | POA: Diagnosis not present

## 2021-04-20 NOTE — Discharge Summary (Signed)
Physician Discharge Summary  ?Patient ID: ?Sharon Bennett ?MRN: [id number] ?DOB/AGE: 08/17/1980 41 y.o. ? ?Admit date: 04/19/2021 ?Discharge date: 04/20/2021 ? ?Admission Diagnoses: ? ?Discharge Diagnoses:  ?Principal Problem: ?  Epidural hematoma ? ? ?Discharged Condition: good ? ?Hospital Course: Patient admitted to the hospital for treatment of a postoperative epidural hemorrhage.  The patient had severe pain numbness and weakness into both upper extremities following recent posterior cervical surgery.  Work-up demonstrates evidence of a postoperative epidural hemorrhage.  She was taken to the operating room and the epidural hemorrhage was evacuated.  Postoperatively she is done very well.  Her neck and upper extremity pain numbness and weakness have completely resolved.  She is standing ambulating and voiding without difficulty.  She is ready for discharge home. ? ?Consults:  ? ?Significant Diagnostic Studies:  ? ?Treatments:  ? ?Discharge Exam: ?Blood pressure 133/87, pulse 88, temperature 98.2 ?F (36.8 ?C), temperature source Oral, resp. rate 18, SpO2 100 %. ?Awake and alert.  Oriented and appropriate.  Motor and sensory function intact.  Wound clean and dry.  Chest and abdomen benign ? ? ? ?Disposition: Discharge disposition: 01-Home or Self Care ? ? ? ? ? ? ? ?Allergies as of 04/20/2021   ? ?   Reactions  ? Codeine Nausea And Vomiting  ? Dilaudid [hydromorphone] Nausea And Vomiting  ? Tegaderm Ag Mesh [silver] Rash  ? Allergic reaction to Tegaderm dressing  ? ?  ? ?  ?Medication List  ?  ? ?TAKE these medications   ? ?acetaminophen 500 MG tablet ?Commonly known as: TYLENOL ?Take 1,000 mg by mouth every 6 (six) hours as needed for mild pain or headache. ?  ?apixaban 5 MG Tabs tablet ?Commonly known as: ELIQUIS ?Take 1 tablet (5 mg total) by mouth 2 (two) times daily. ?What changed: Another medication with the same name was removed. Continue taking this medication, and follow the directions you see here. ?   ?aspirin-acetaminophen-caffeine 250-250-65 MG tablet ?Commonly known as: Brandywine ?Take 1 tablet by mouth every 6 (six) hours as needed for headache. ?  ?cyclobenzaprine 10 MG tablet ?Commonly known as: FLEXERIL ?Take 1 tablet (10 mg total) by mouth 3 (three) times daily as needed for muscle spasms ?  ?HYDROcodone-acetaminophen 5-325 MG tablet ?Commonly known as: NORCO/VICODIN ?Take 1 tablet by mouth every 4 (four) hours as needed for pain ?  ?lidocaine 5 % ?Commonly known as: Lidoderm ?Place 1 patch onto the skin daily. Remove & Discard patch within 12 hours or as directed by MD ?  ?lidocaine-prilocaine cream ?Commonly known as: EMLA ?Apply 1/2 tablespoon to port site and cover with plastic wrap 2 hours prior to stick to numb site ?  ?magic mouthwash Soln ?Take 5 mLs by mouth 4 (four) times daily as needed for mouth pain. ?  ?melatonin 5 MG Tabs ?Take 5 mg by mouth at bedtime as needed (sleep). ?  ?ondansetron 8 MG tablet ?Commonly known as: ZOFRAN ?Take 1 tablet (8 mg total) by mouth every 8 (eight) hours as needed for nausea or vomiting. ?  ?pantoprazole 40 MG tablet ?Commonly known as: PROTONIX ?TAKE 1 TABLET BY MOUTH EVERY DAY ?  ?potassium chloride SA 20 MEQ tablet ?Commonly known as: KLOR-CON M ?Take 1 tablet (20 mEq total) by mouth daily. ?  ?potassium chloride SA 20 MEQ tablet ?Commonly known as: KLOR-CON M ?Take 1 tablet by mouth daily ?  ?prochlorperazine 10 MG tablet ?Commonly known as: COMPAZINE ?Take 1 tablet (10 mg total) by mouth every  6 (six) hours as needed for nausea or vomiting. ?  ?psyllium 58.6 % powder ?Commonly known as: METAMUCIL ?Take 1 packet by mouth daily. ?  ?Slynd 4 MG Tabs ?Generic drug: Drospirenone ?Take 1 tablet by mouth daily. ?  ? ?  ? ? ? ?Signed: ?Cooper Render Brynnlee Cumpian ?04/20/2021, 8:15 AM ? ? ?

## 2021-04-20 NOTE — Telephone Encounter (Signed)
I contacted Sharon Bennett to follow-up on the most recent hospitalization/surgery.  She is now home.  She is feeling much better.  I confirmed she has instructions to hold anticoagulation. She sees [name] in follow-up in approximately 2 weeks.  [name] agrees with this plan. ?

## 2021-04-20 NOTE — Progress Notes (Signed)
Patient awaiting transport via wheelchair by volunteer for discharge home; in no acute distress nor complaints of pain nor discomfort; incision on her posterior neck with honeycomb dressing and is clean, dry and intact; room was checked for her belongings; discharge instructions concerning her medications, incision care; follow up appointment and when to call the doctor as needed were discussed with patient by RN and  she verbalized understanding on the instructions given.  ?  ?  ?  ? ?

## 2021-04-20 NOTE — Plan of Care (Signed)
?  Problem: Education: Goal: Ability to verbalize activity precautions or restrictions will improve Outcome: Completed/Met Goal: Knowledge of the prescribed therapeutic regimen will improve Outcome: Completed/Met Goal: Understanding of discharge needs will improve Outcome: Completed/Met   Problem: Activity: Goal: Ability to avoid complications of mobility impairment will improve Outcome: Completed/Met Goal: Ability to tolerate increased activity will improve Outcome: Completed/Met Goal: Will remain free from falls Outcome: Completed/Met   Problem: Bowel/Gastric: Goal: Gastrointestinal status for postoperative course will improve Outcome: Completed/Met   Problem: Clinical Measurements: Goal: Ability to maintain clinical measurements within normal limits will improve Outcome: Completed/Met Goal: Postoperative complications will be avoided or minimized Outcome: Completed/Met Goal: Diagnostic test results will improve Outcome: Completed/Met   Problem: Pain Management: Goal: Pain level will decrease Outcome: Completed/Met   Problem: Skin Integrity: Goal: Will show signs of wound healing Outcome: Completed/Met   Problem: Health Behavior/Discharge Planning: Goal: Identification of resources available to assist in meeting health care needs will improve Outcome: Completed/Met   Problem: Bladder/Genitourinary: Goal: Urinary functional status for postoperative course will improve Outcome: Completed/Met   Problem: Safety: Goal: Ability to remain free from injury will improve Outcome: Completed/Met   

## 2021-04-20 NOTE — Evaluation (Signed)
Occupational Therapy Evaluation ?Patient Details ?Name: Sharon Bennett ?MRN: [id number] ?DOB: 1980-03-07 ?Today's Date: 04/20/2021 ? ? ?History of Present Illness Pt is a 41 y/o female who recently underwent right-sided C6-7 laminotomy foraminotomy microdiscectomy. She presents now with postoperative pain and increasing paresthesias, dysesthesias and numbness in the both upper extremities with some subjective weakness. Patient has an MRI scan which demonstrates a large postoperative dorsal epidural hematoma with compression. She is now s/p reexploration of right C6-7 laminotomy with C6 and C5 decompressive laminectomy and partial C7 decompressive laminectomy and evacuation of hemorrhage on 04/19/2021. PMH significant for cancer.  ? ?Clinical Impression ?  ?Pt admitted for concerns listed above. 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 At this time, pt presents near her baseline, independent with all ADL's and functional mobility. Pt was educated on compensatory strategies and cervical precautions. She has no further OT needs at this time and acute OT will sign off.   ?   ? ?Recommendations for follow up therapy are one component of a multi-disciplinary discharge planning process, led by the attending physician.  Recommendations may be updated based on patient status, additional functional criteria and insurance authorization.  ? ?Follow Up Recommendations ? No OT follow up  ?  ?Assistance Recommended at Discharge PRN  ?Patient can return home with the following A little help with bathing/dressing/bathroom;Assistance with cooking/housework ? ?  ?Functional Status Assessment ? Patient has had a recent decline in their functional status and demonstrates the ability to make significant improvements in function in a reasonable and predictable amount of time.  ?Equipment Recommendations ? None recommended by OT  ?  ?Recommendations for Other Services    ? ? ?  ?Precautions / Restrictions Precautions ?Precautions: Fall ?Precaution Booklet Issued: Yes (comment) ?Precaution Comments: Reviewed handout and pt was cued for precautions during functional mobility. ?Required Braces or Orthoses: Cervical Brace ?Cervical Brace: Soft collar ?Restrictions ?Weight Bearing Restrictions: No  ? ?  ? ?Mobility Bed Mobility ?  ?  ?  ?  ?  ?  ?  ?General bed mobility comments: Up in recliner ?  ? ?Transfers ?Overall transfer level: Modified independent ?Equipment used: None ?  ?  ?  ?  ?  ?  ?  ?General transfer comment: No assist to power up to full stand. ?  ? ?  ?Balance Overall balance assessment: No apparent balance deficits (not formally assessed) ?  ?  ?  ?  ?  ?  ?  ?  ?  ?  ?  ?  ?  ?  ?  ?  ?  ?  ?   ? ?ADL either performed or assessed with clinical judgement  ? ?ADL Overall ADL's : Independent ?  ?  ?  ?  ?  ?  ?  ?  ?  ?  ?  ?  ?  ?  ?  ?  ?  ?  ?  ?General ADL Comments: Pt able to complete ADL's safely sitting or standing  ? ? ? ?Vision Baseline Vision/History: 0 No visual deficits ?Ability to See in Adequate Light: 0 Adequate ?Patient Visual Report: No change from baseline ?Vision Assessment?: No apparent visual deficits  ?   ?Perception   ?  ?Praxis   ?  ? ?Pertinent Vitals/Pain Pain Assessment ?Pain Assessment: Faces ?Faces Pain Scale: Hurts a little bit ?Pain Location: Incision site ?Pain Descriptors / Indicators: Operative site  guarding ?Pain Intervention(s): Limited activity within patient's tolerance, Monitored during session, Repositioned  ? ? ? ?Hand Dominance   ?  ?Extremity/Trunk Assessment Upper Extremity Assessment ?Upper Extremity Assessment: Overall WFL for tasks assessed (Some numbness/tingling due to chemo effects.) ?  ?Lower Extremity Assessment ?Lower Extremity Assessment: Defer to PT evaluation ?  ?Cervical / Trunk Assessment ?Cervical / Trunk Assessment: Neck Surgery ?  ?Communication Communication ?Communication: No difficulties ?  ?Cognition  Arousal/Alertness: Awake/alert ?Behavior During Therapy: [facility] for tasks assessed/performed ?Overall Cognitive Status: Within Functional Limits for tasks assessed ?  ?  ?  ?  ?  ?  ?  ?  ?  ?  ?  ?  ?  ?  ?  ?  ?  ?  ?  ?General Comments  VSS on RA, bandage intact, not apparent drainage ? ?  ?Exercises   ?  ?Shoulder Instructions    ? ? ?Home Living Family/patient expects to be discharged to:: Private residence ?Living Arrangements: Alone ?Available Help at Discharge: Family;Available PRN/intermittently ?Type of Home: House ?Home Access: Stairs to enter ?Entrance Stairs-Number of Steps: 1 ?Entrance Stairs-Rails: None ?Home Layout: Two level;Bed/bath upstairs ?Alternate Level Stairs-Number of Steps: flight ?Alternate Level Stairs-Rails: Right ?Bathroom Shower/Tub: Tub/shower unit ?  ?Bathroom Toilet: Standard ?  ?  ?Home Equipment: None ?  ?  ?  ? ?  ?Prior Functioning/Environment Prior Level of Function : Independent/Modified Independent;Working/employed ?  ?  ?  ?  ?  ?  ?  ?  ?  ? ?  ?  ?OT Problem List: Decreased activity tolerance;Impaired balance (sitting and/or standing);Pain ?  ?   ?OT Treatment/Interventions:    ?  ?OT Goals(Current goals can be found in the care plan section) Acute Rehab OT Goals ?Patient Stated Goal: To go home ?OT Goal Formulation: All assessment and education complete, DC therapy ?Time For Goal Achievement: 04/20/21 ?Potential to Achieve Goals: Good  ?OT Frequency:   ?  ? ?Co-evaluation   ?  ?  ?  ?  ? ?  ?AM-PAC OT "6 Clicks" Daily Activity     ?Outcome Measure Help from another person eating meals?: None ?Help from another person taking care of personal grooming?: None ?Help from another person toileting, which includes using toliet, bedpan, or urinal?: None ?Help from another person bathing (including washing, rinsing, drying)?: None ?Help from another person to put on and taking off regular upper body clothing?: None ?Help from another person to put on and taking off regular lower body  clothing?: None ?6 Click Score: 24 ?  ?End of Session Equipment Utilized During Treatment: Cervical collar ?Nurse Communication: Mobility status ? ?Activity Tolerance: Patient tolerated treatment well ?Patient left: in chair;with call bell/phone within reach ? ?OT Visit Diagnosis: Unsteadiness on feet (R26.81);Other abnormalities of gait and mobility (R26.89);Muscle weakness (generalized) (M62.81)  ?              ?Time: 0076-2263 ?OT Time Calculation (min): 12 min ?Charges:  OT General Charges ?$OT Visit: 1 Visit ?OT Evaluation ?$OT Eval Moderate Complexity: 1 Mod ? ?Reginia Battie H., OTR/L ?Acute Rehabilitation ? ?Vicci Reder Elane Yolanda Bonine ?04/20/2021, 10:11 AM ?

## 2021-04-20 NOTE — Discharge Instructions (Signed)

## 2021-04-20 NOTE — Evaluation (Signed)
Physical Therapy Evaluation and Discharge ?Patient Details ?Name: Sharon Bennett ?MRN: [id number] ?DOB: [birth year] ?Today's Date: 04/20/2021 ? ?History of Present Illness ? Pt is a 41 y/o female who recently underwent right-sided C6-7 laminotomy foraminotomy microdiscectomy. She presents now with postoperative pain and increasing paresthesias, dysesthesias and numbness in the both upper extremities with some subjective weakness. Patient has an MRI scan which demonstrates a large postoperative dorsal epidural hematoma with compression. She is now s/p reexploration of right C6-7 laminotomy with C6 and C5 decompressive laminectomy and partial C7 decompressive laminectomy and evacuation of hemorrhage on 04/19/2021. PMH significant for cancer. ?  ?Clinical Impression ? Patient evaluated by Physical Therapy with no further acute PT needs identified. All education has been completed and the patient has no further questions. Pt was able to demonstrate transfers and ambulation with gross modified independence and no AD. Pt was educated on precautions, brace application/wearing schedule, appropriate activity progression, and car transfer. See below for any follow-up Physical Therapy or equipment needs. PT is signing off. Thank you for this referral.    ?   ? ?Recommendations for follow up therapy are one component of a multi-disciplinary discharge planning process, led by the attending physician.  Recommendations may be updated based on patient status, additional functional criteria and insurance authorization. ? ?Follow Up Recommendations No PT follow up ? ?  ?Assistance Recommended at Discharge PRN  ?Patient can return home with the following ? Assist for transportation ? ?  ?Equipment Recommendations None recommended by PT  ?Recommendations for Other Services ?    ?  ?Functional Status Assessment Patient has had a recent decline in their functional status and demonstrates the ability to make significant improvements in  function in a reasonable and predictable amount of time.  ? ?  ?Precautions / Restrictions Precautions ?Precautions: Fall ?Precaution Booklet Issued: Yes (comment) ?Precaution Comments: Reviewed handout and pt was cued for precautions during functional mobility. ?Required Braces or Orthoses: Cervical Brace ?Cervical Brace: Soft collar ?Restrictions ?Weight Bearing Restrictions: No  ? ?  ? ?Mobility ? Bed Mobility ?Overal bed mobility: Modified Independent ?  ?  ?  ?  ?  ?  ?General bed mobility comments: HOB elevated - reports this is how she sleeps at home ?  ? ?Transfers ?Overall transfer level: Modified independent ?Equipment used: None ?  ?  ?  ?  ?  ?  ?  ?General transfer comment: No assist to power up to full stand. ?  ? ?Ambulation/Gait ?Ambulation/Gait assistance: Modified independent (Device/Increase time) ?Gait Distance (Feet): 500 Feet ?Assistive device: None ?Gait Pattern/deviations: Step-through pattern, Decreased stride length, Trunk flexed ?Gait velocity: Decreased ?Gait velocity interpretation: 1.31 - 2.62 ft/sec, indicative of limited community ambulator ?  ?General Gait Details: Pt with maintenance of good posture throughout gait. No unsteadiness or LOB noted. ? ?Stairs ?Stairs: Yes ?Stairs assistance: Modified independent (Device/Increase time) ?Stair Management: One rail Right, Alternating pattern, Forwards ?Number of Stairs: 10 ?General stair comments: VC's for sequencing and general safety. No assist required ? ?Wheelchair Mobility ?  ? ?Modified Rankin (Stroke Patients Only) ?  ? ?  ? ?Balance Overall balance assessment: No apparent balance deficits (not formally assessed) ?  ?  ?  ?  ?  ?  ?  ?  ?  ?  ?  ?  ?  ?  ?  ?  ?  ?  ?   ? ? ? ?Pertinent Vitals/Pain Pain Assessment ?Pain Assessment: Faces ?Faces Pain Scale: Hurts a little  bit ?Pain Location: Incision site ?Pain Descriptors / Indicators: Operative site guarding ?Pain Intervention(s): Limited activity within patient's tolerance,  Monitored during session, Repositioned  ? ? ?Home Living Family/patient expects to be discharged to:: Private residence ?Living Arrangements: Alone ?Available Help at Discharge: Family;Available PRN/intermittently ?Type of Home: House ?Home Access: Stairs to enter ?Entrance Stairs-Rails: None ?Entrance Stairs-Number of Steps: 1 ?Alternate Level Stairs-Number of Steps: flight ?Home Layout: Two level;Bed/bath upstairs ?Home Equipment: None ?   ?  ?Prior Function Prior Level of Function : Independent/Modified Independent;Working/employed ?  ?  ?  ?  ?  ?  ?  ?  ?  ? ? ?Hand Dominance  ?   ? ?  ?Extremity/Trunk Assessment  ? Upper Extremity Assessment ?Upper Extremity Assessment: Overall WFL for tasks assessed (Some numbness/tingling due to chemo effects.) ?  ? ?Lower Extremity Assessment ?Lower Extremity Assessment: Defer to PT evaluation ?  ? ?Cervical / Trunk Assessment ?Cervical / Trunk Assessment: Neck Surgery  ?Communication  ? Communication: No difficulties  ?Cognition Arousal/Alertness: Awake/alert ?Behavior During Therapy: [facility] for tasks assessed/performed ?Overall Cognitive Status: Within Functional Limits for tasks assessed ?  ?  ?  ?  ?  ?  ?  ?  ?  ?  ?  ?  ?  ?  ?  ?  ?  ?  ?  ? ?  ?General Comments General comments (skin integrity, edema, etc.): VSS on RA, bandage intact, not apparent drainage ? ?  ?Exercises    ? ?Assessment/Plan  ?  ?PT Assessment Patient does not need any further PT services  ?PT Problem List   ? ?   ?  ?PT Treatment Interventions     ? ?PT Goals (Current goals can be found in the Care Plan section)  ?Acute Rehab PT Goals ?Patient Stated Goal: Home today ?PT Goal Formulation: All assessment and education complete, DC therapy ? ?  ?Frequency   ?  ? ? ?Co-evaluation   ?  ?  ?  ?  ? ? ?  ?AM-PAC PT "6 Clicks" Mobility  ?Outcome Measure Help needed turning from your back to your side while in a flat bed without using bedrails?: None ?Help needed moving from lying on your back to sitting on  the side of a flat bed without using bedrails?: None ?Help needed moving to and from a bed to a chair (including a wheelchair)?: None ?Help needed standing up from a chair using your arms (e.g., wheelchair or bedside chair)?: None ?Help needed to walk in hospital room?: None ?Help needed climbing 3-5 steps with a railing? : None ?6 Click Score: 24 ? ?  ?End of Session Equipment Utilized During Treatment: Cervical collar ?Activity Tolerance: Patient tolerated treatment well ?Patient left: in chair;with call bell/phone within reach ?Nurse Communication: Mobility status ?PT Visit Diagnosis: Unsteadiness on feet (R26.81);Pain ?Pain - part of body:  (neck) ?  ? ?Time: 5573-2202 ?PT Time Calculation (min) (ACUTE ONLY): 19 min ? ? ?Charges:   PT Evaluation ?$PT Eval Low Complexity: 1 Low ?  ?  ?   ? ? ?Rolinda Roan, PT, DPT ?Acute Rehabilitation Services ?Pager: [phone] ?Office: [phone]  ? ?Thelma Comp ?04/20/2021, 10:30 AM ? ?

## 2021-04-21 ENCOUNTER — Other Ambulatory Visit (HOSPITAL_COMMUNITY): Payer: Self-pay

## 2021-04-21 MED ORDER — METHYLPREDNISOLONE 4 MG PO TBPK
ORAL_TABLET | ORAL | 0 refills | Status: DC
  Filled 2021-04-21: qty 21, 6d supply, fill #0

## 2021-04-21 NOTE — Progress Notes (Signed)
Sent message, via epic in basket, requesting orders in epic from surgeon.  

## 2021-04-23 ENCOUNTER — Ambulatory Visit: Payer: Self-pay | Admitting: Surgery

## 2021-04-23 DIAGNOSIS — Z01818 Encounter for other preprocedural examination: Secondary | ICD-10-CM

## 2021-04-24 LAB — CULTURE, BLOOD (ROUTINE X 2)
Culture: NO GROWTH
Culture: NO GROWTH
Special Requests: ADEQUATE
Special Requests: ADEQUATE

## 2021-04-26 ENCOUNTER — Other Ambulatory Visit: Payer: Self-pay

## 2021-04-26 ENCOUNTER — Ambulatory Visit (HOSPITAL_BASED_OUTPATIENT_CLINIC_OR_DEPARTMENT_OTHER): Payer: 59 | Admitting: Family Medicine

## 2021-04-26 ENCOUNTER — Encounter (HOSPITAL_BASED_OUTPATIENT_CLINIC_OR_DEPARTMENT_OTHER): Payer: Self-pay | Admitting: Family Medicine

## 2021-04-26 ENCOUNTER — Other Ambulatory Visit (HOSPITAL_COMMUNITY): Payer: Self-pay

## 2021-04-26 DIAGNOSIS — R079 Chest pain, unspecified: Secondary | ICD-10-CM | POA: Diagnosis not present

## 2021-04-26 NOTE — Progress Notes (Signed)
? ? ?  Procedures performed today:   ? ?None. ? ?Independent interpretation of notes and tests performed by another provider:  ? ?None. ? ?Brief History, Exam, Impression, and Recommendations:   ? ?BP 122/84   Pulse (!) 112   Temp 97.9 ?F (36.6 ?C)   Ht '5\' 6"'$  (1.676 m)   Wt 163 lb 3.2 oz (74 kg)   SpO2 100%   BMI 26.34 kg/m?  ? ?Chest pain ?Patient reports over the past 3 to 4 weeks she has had intermittent episodes of central chest pain.  Initial episode occurred while laying in bed.  It was felt this was likely related to indigestion as she does have a history of this.  She tried taking Tums as this typically has helped in the past, she is unsure if she had relief with use of Tums however.  She also had another episode while watching TV.  Generally these episodes have lasted about 30 to 40 minutes.  She does have some concern given her family history of heart disease and she reports family history of sudden death in the family member who is younger than the age of 91.  She has had various EKGs in the past due to her other underlying medical conditions, these have been generally nonspecific ?On exam, cardiovascular exam with borderline tachycardia, regular rhythm.  Lungs clear to auscultation bilaterally. ?Discussed options with patient.  Particularly given her family history, will proceed with referral to cardiology for further evaluation and recommendations.  Additional consideration will be evaluation with gastroenterology, however will complete cardiology evaluation initially ? ?Plan for follow-up in about 1 to 2 months to monitor progress ? ? ?___________________________________________ ?Maridel Pixler de [place], MD, ABFM, CAQSM ?Primary Care and Sports Medicine ?[place] ?

## 2021-04-26 NOTE — Patient Instructions (Signed)

## 2021-04-26 NOTE — Assessment & Plan Note (Addendum)
Patient reports over the past 3 to 4 weeks she has had intermittent episodes of central chest pain.  Initial episode occurred while laying in bed.  It was felt this was likely related to indigestion as she does have a history of this.  She tried taking Tums as this typically has helped in the past, she is unsure if she had relief with use of Tums however.  She also had another episode while watching TV.  Generally these episodes have lasted about 30 to 40 minutes.  She does have some concern given her family history of heart disease and she reports family history of sudden death in the family member who is younger than the age of 54.  She has had various EKGs in the past due to her other underlying medical conditions, these have been generally nonspecific ?On exam, cardiovascular exam with borderline tachycardia, regular rhythm.  Lungs clear to auscultation bilaterally. ?Discussed options with patient.  Particularly given her family history, will proceed with referral to cardiology for further evaluation and recommendations.  Additional consideration will be evaluation with gastroenterology, however will complete cardiology evaluation initially ?

## 2021-04-28 NOTE — Patient Instructions (Signed)
2 VISITORS  (aged 42 and older)  IS ALLOWED TO COME WITH YOU AND STAY IN THE WAITING ROOM ONLY DURING PRE OP AND PROCEDURE.  ?  ?**NO VISITORS ARE ALLOWED IN THE SHORT STAY AREA OR RECOVERY ROOM!!** ? ?IF YOU WILL BE ADMITTED INTO THE HOSPITAL YOU ARE ALLOWED ONLY 4 SUPPORT PEOPLE DURING VISITATION HOURS ONLY (7 AM -8PM)   ?The support person(s) must pass our screening, gel in and out, and wear a mask at all times, including in the patient?s room. ?Patients must also wear a mask when staff or their support person are in the room. ?Visitors GUEST BADGE MUST BE WORN VISIBLY  ?One adult visitor may remain with you overnight and MUST be in the room by 8 P.M. ?  ? ? Your procedure is scheduled on: 05/10/21 ? ? Report to [facility] Main Entrance ? ?  Report to admitting at  6:15 AM ? ? Call this number if you have problems the morning of surgery [phone] ? ? Do not eat food :After Midnight.05/08/21 ? ? After Midnight you may have the following liquids until _5:30_____ AM DAY OF SURGERY ? ?Water ?Black Coffee (sugar ok, NO MILK/CREAM OR CREAMERS)  ?Tea (sugar ok, NO MILK/CREAM OR CREAMERS) regular and decaf                             ?Plain Jell-O (NO RED)                                           ?Fruit ices (not with fruit pulp, NO RED)                                     ?Popsicles (NO RED)                                                                  ?Juice: apple, WHITE grape, WHITE cranberry ?Sports drinks like Gatorade (NO RED) ?Clear broth(vegetable,chicken,beef) ? ?Make sure you drink plenty of liquids on your day of prep 05/09/21. ?             ?Drink 2 Ensure/G2 drinks AT 10:00 PM the night before surgery.  05/09/21 ? ?  ?  ?The day of surgery: 05/10/21 ? ?Drink ONE (1) Pre-Surgery Clear Ensure or at  5:15 AM the morning of surgery. Drink in one sitting. Do not sip.  ?This drink was given to you during your hospital  ?pre-op appointment visit. ?Nothing else to drink after completing the  ?Pre-Surgery  Clear Ensure at 5:30 AM ?  ?       If you have questions, please contact your surgeon?s office. ? ? ?FOLLOW BOWEL PREP AND ANY ADDITIONAL PRE OP INSTRUCTIONS YOU RECEIVED FROM YOUR SURGEON'S OFFICE!!! ?  ?  ?Oral Hygiene is also important to reduce your risk of infection.                                    ?  Remember - BRUSH YOUR TEETH THE MORNING OF SURGERY WITH YOUR REGULAR TOOTHPASTE ? ? Do NOT smoke after Midnight ? ? Take these medicines the morning of surgery with A SIP OF WATER: none ? ? ?Bring CPAP mask and tubing day of surgery. ?                  ?           You may not have any metal on your body including hair pins, jewelry, and body piercing ? ?           Do not wear make-up, lotions, powders, perfumes or deodorant ? ?Do not wear nail polish including gel and S&S, artificial/acrylic nails, or any other type of covering on natural nails including finger and toenails. If you have artificial nails, gel coating, etc. that needs to be removed by a nail salon please have this removed prior to surgery or surgery may need to be canceled/ delayed if the surgeon/ anesthesia feels like they are unable to be safely monitored.  ? ?Do not shave  48 hours prior to surgery.  ? ? ? Do not bring valuables to the hospital. [facility] NOT ?            RESPONSIBLE   FOR VALUABLES. ? ? Contacts, dentures or bridgework may not be worn into surgery. ? ? Bring small overnight bag day of surgery. ?  ? ? Special Instructions: Bring a copy of your healthcare power of attorney and living will documents  the day of surgery if you have them.  ? ?            Please read over the following fact sheets you were given: IF [place] [phone] ? ? ?   [place] - Preparing for Surgery ?Before surgery, you can play an important role.  Because skin is not sterile, your skin needs to be as free of germs as possible.  You can reduce the number of germs on your skin by washing with CHG  (chlorahexidine gluconate) soap before surgery.  CHG is an antiseptic cleaner which kills germs and bonds with the skin to continue killing germs even after washing. ?Please DO NOT use if you have an allergy to CHG or antibacterial soaps.  If your skin becomes reddened/irritated stop using the CHG and inform your nurse when you arrive at Short Stay. ?Do not shave (including legs and underarms) for at least 48 hours prior to the first CHG shower.  [facility] ?Please follow these instructions carefully: ? 1.  Shower with CHG Soap the night before surgery and the  morning of Surgery. ? 2.  If you choose to wash your hair, wash your hair first as usual with your  normal  shampoo. ? 3.  After you shampoo, rinse your hair and body thoroughly to remove the  shampoo.                        ?    4.  Use CHG as you would any other liquid soap.  You can apply chg directly  to the skin and wash  ?                     Gently with a scrungie or clean washcloth. ? 5.  Apply the CHG Soap to your body ONLY FROM THE NECK DOWN.   Do not use  on face/ open      ?                     Wound or open sores. Avoid contact with eyes, ears mouth and genitals (private parts).  ?                     Production manager,  Genitals (private parts) with your normal soap. ?            6.  Wash thoroughly, paying special attention to the area where your surgery  will be performed. ? 7.  Thoroughly rinse your body with warm water from the neck down. ? 8.  DO NOT shower/wash with your normal soap after using and rinsing off  the CHG Soap. ?            9.  Pat yourself dry with a clean towel. ?           10.  Wear clean pajamas. ?           11.  Place clean sheets on your bed the night of your first shower and do not  sleep with pets. ?Day of Surgery : ?Do not apply any lotions/deodorants the morning of surgery.  Please wear clean clothes to the hospital/surgery center. ? ?FAILURE TO FOLLOW THESE INSTRUCTIONS MAY RESULT IN THE CANCELLATION OF YOUR  SURGERY ? ? ? ?________________________________________________________________________  ? ?Incentive Spirometer ? ?An incentive spirometer is a tool that can help keep your lungs clear and active. This tool measures how well you are filling your lungs with each breath. Taking long deep breaths may help reverse or decrease the chance of developing breathing (pulmonary) problems (especially infection) following: ?A long period of time when you are unable to move or be active. ?BEFORE THE PROCEDURE  ?If the spirometer includes an indicator to show your best effort, your nurse or respiratory therapist will set it to a desired goal. ?If possible, sit up straight or lean slightly forward. Try not to slouch. ?Hold the incentive spirometer in an upright position. ?INSTRUCTIONS FOR USE  ?Sit on the edge of your bed if possible, or sit up as far as you can in bed or on a chair. ?Hold the incentive spirometer in an upright position. ?Breathe out normally. ?Place the mouthpiece in your mouth and seal your lips tightly around it. ?Breathe in slowly and as deeply as possible, raising the piston or the ball toward the top of the column. ?Hold your breath for 3-5 seconds or for as long as possible. Allow the piston or ball to fall to the bottom of the column. ?Remove the mouthpiece from your mouth and breathe out normally. ?Rest for a few seconds and repeat Steps 1 through 7 at least 10 times every 1-2 hours when you are awake. Take your time and take a few normal breaths between deep breaths. ?The spirometer may include an indicator to show your best effort. Use the indicator as a goal to work toward during each repetition. ?After each set of 10 deep breaths, practice coughing to be sure your lungs are clear. If you have an incision (the cut made at the time of surgery), support your incision when coughing by placing a pillow or rolled up towels firmly against it. ?Once you are able to get out of bed, walk around indoors and  cough well. You may stop using the incentive spirometer when instructed by your caregiver.  ?RISKS AND  COMPLICATIONS ?Take your time so you do not get dizzy or light-headed. ?If you are in pain, you may need to take or ask for pai

## 2021-05-01 ENCOUNTER — Encounter (HOSPITAL_COMMUNITY)
Admission: RE | Admit: 2021-05-01 | Discharge: 2021-05-01 | Disposition: A | Discharge status: Home / Self Care | Payer: 59 | Source: Ambulatory Visit | Attending: Surgery | Admitting: Surgery

## 2021-05-01 ENCOUNTER — Encounter (HOSPITAL_COMMUNITY): Payer: Self-pay

## 2021-05-01 ENCOUNTER — Other Ambulatory Visit: Payer: Self-pay

## 2021-05-01 VITALS — BP 123/80 | HR 110 | Temp 99.4°F | Resp 16 | Ht 66.0 in | Wt 160.0 lb

## 2021-05-01 DIAGNOSIS — C20 Malignant neoplasm of rectum: Secondary | ICD-10-CM | POA: Diagnosis not present

## 2021-05-01 DIAGNOSIS — Z01812 Encounter for preprocedural laboratory examination: Secondary | ICD-10-CM | POA: Insufficient documentation

## 2021-05-01 DIAGNOSIS — Z01818 Encounter for other preprocedural examination: Secondary | ICD-10-CM

## 2021-05-01 NOTE — Consult Note (Signed)
[facility] Nurse Consult Note: ? ?[facility] Nurse requested for preoperative stoma site marking by Dr. Deland Pretty. ? ?Discussed surgical procedure and stoma creation with patient.  Explained role of the [facility] nurse team.  Answered patient's questions.  ? 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 She has some familiarity with ostomies, but is reassured when I tell her we will teach and treat her as if she has no prior knowledge of ostomies, as each one is different and as the "lived experience" is different than a care provider experience.  ? ?Examined patient sitting and standing in order to place the marking in the patient's visual field, away from any creases or abdominal contour issues and within the rectus muscle.   ? ?Marked for colostomy in the LLQ  5cm to the left of the umbilicus and 3cm below the umbilicus. ? ?Marked for ileostomy in the RLQ  5.5cm to the right of the umbilicus and  8.3FX below the umbilicus. ? ?Patient's abdomen cleansed with CHG wipes at site markings, allowed to air dry prior to marking. Covered marks with thin film transparent dressing to preserve until date of surgery (Wednesday, 05/10/21).  ? ?[facility] Nurse team will follow up with patient after surgery for continued ostomy care and teaching.  ? ?Thank you for inviting [place] to meet and mark this nice patient before surgery.  ? ?Maudie Flakes, MSN, RN, [place], [place], CWON-AP, [place]  ?Pager# [phone]  ?

## 2021-05-01 NOTE — Progress Notes (Signed)
Anesthesia note: ? ?Bowel prep reminder:yes reviewed with Pt. ? ?PCP - Dr. Sydell Axon ?Cardiologist -Pt will see Dr. Berneice Gandy on 05/25/21 for sinus tachycardia ?Other-  ? ?Chest x-ray - 04/19/21 ?EKG - 04/19/21 ?Stress Test - no ?ECHO - no ?Cardiac Cath - NA ? ?Pacemaker/ICD device last checked:NA ? ?Sleep Study - no ?CPAP -  ? ?Pt is pre diabetic-NA ?Fasting Blood Sugar -  ?Checks Blood Sugar _____ ? ?Blood Thinner:NA ?Blood Thinner Instructions: ?Aspirin Instructions: ?Last Dose: ? ?Anesthesia review: yes ? ?Patient denies shortness of breath, fever, cough and chest pain at PAT appointment ?Pt has no SOB with activities. She is still sore from neck surgery 04/19/21. She had a hematoma after surgery and had to have a 2nd procedure. She had her port removed because of a blood clot. She is anxious about complications with this surgery. ?The pre surgical ensure made her feel sick before her neck surgery so she opted for the G2 drinks. ? ?Patient verbalized understanding of instructions that were given to them at the PAT appointment. Patient was also instructed that they will need to review over the PAT instructions again at home before surgery. yes ?

## 2021-05-03 ENCOUNTER — Encounter (HOSPITAL_COMMUNITY): Payer: Self-pay

## 2021-05-03 NOTE — Progress Notes (Signed)
Long term disability through [employer] completed and faxed to [phone].  Confirmation # [id number] ?

## 2021-05-09 NOTE — Anesthesia Preprocedure Evaluation (Addendum)
Anesthesia Evaluation  ?Patient identified by MRN, date of birth, ID band ?Patient awake ? ? ? ?Reviewed: ?Allergy & Precautions, NPO status , Patient's Chart, lab work & pertinent test results ? ?History of Anesthesia Complications ?Negative for: history of anesthetic complications ? ?Airway ?Mallampati: II ? ?TM Distance: >3 FB ?Neck ROM: Full ? ? ? Dental ?no notable dental hx. ?(+) Dental Advisory Given ?  ?Pulmonary ?neg pulmonary ROS,  ?  ?Pulmonary exam normal ? ? ? ? ? ? ? Cardiovascular ?negative cardio ROS ?Normal cardiovascular exam ? ? ?  ?Neuro/Psych ?Hematoma following recent status post C6-7 posterior cervical laminotomy and foraminotomy and microdiscectomy ?  ? GI/Hepatic ?Neg liver ROS, GERD  Medicated,H/o colorectal cancer ? ?  ?Endo/Other  ?negative endocrine ROS ? Renal/GU ?negative Renal ROS  ? ?  ?Musculoskeletal ?negative musculoskeletal ROS ?(+)  ? Abdominal ?  ?Peds ? Hematology ? ?(+) Blood dyscrasia (Eliquis), anemia ,   ?Anesthesia Other Findings ?Day of surgery medications reviewed with the patient. ? Reproductive/Obstetrics ? ?  ? ? ? ? ? ? ? ? ? ? ? ? ? ?  ?  ? ? ? ? ? ? ? ?Anesthesia Physical ? ?Anesthesia Plan ? ?ASA: 2 ? ?Anesthesia Plan: General  ? ?Post-op Pain Management: Celebrex PO (pre-op)*, Tylenol PO (pre-op)* and Lidocaine infusion*  ? ?Induction: Intravenous ? ?PONV Risk Score and Plan: 4 or greater and Midazolam, Dexamethasone, Ondansetron and Scopolamine patch - Pre-op ? ?Airway Management Planned: Oral ETT ? ?Additional Equipment:  ? ?Intra-op Plan:  ? ?Post-operative Plan: Extubation in OR ? ?Informed Consent: I have reviewed the patients History and Physical, chart, labs and discussed the procedure including the risks, benefits and alternatives for the proposed anesthesia with the patient or authorized representative who has indicated his/her understanding and acceptance.  ? ? ? ?Dental advisory given ? ?Plan Discussed with:  Anesthesiologist and CRNA ? ?Anesthesia Plan Comments:   ? ? ? ? ? ?Anesthesia Quick Evaluation ? ?

## 2021-05-10 ENCOUNTER — Other Ambulatory Visit: Payer: Self-pay

## 2021-05-10 ENCOUNTER — Encounter (HOSPITAL_COMMUNITY)
Admission: RE | Disposition: A | Discharge status: Home / Self Care | Payer: Self-pay | Source: Home / Self Care | Attending: Surgery

## 2021-05-10 ENCOUNTER — Inpatient Hospital Stay (HOSPITAL_COMMUNITY)
Admission: RE | Admit: 2021-05-10 | Discharge: 2021-05-14 | DRG: 331 | Disposition: A | Discharge status: Home / Self Care | Payer: 59 | Attending: Surgery | Admitting: Surgery

## 2021-05-10 ENCOUNTER — Inpatient Hospital Stay (HOSPITAL_COMMUNITY): Payer: 59 | Admitting: Anesthesiology

## 2021-05-10 ENCOUNTER — Encounter (HOSPITAL_COMMUNITY): Payer: Self-pay | Admitting: Surgery

## 2021-05-10 DIAGNOSIS — Z432 Encounter for attention to ileostomy: Secondary | ICD-10-CM | POA: Diagnosis not present

## 2021-05-10 DIAGNOSIS — Z9109 Other allergy status, other than to drugs and biological substances: Secondary | ICD-10-CM | POA: Diagnosis not present

## 2021-05-10 DIAGNOSIS — Z634 Disappearance and death of family member: Secondary | ICD-10-CM | POA: Diagnosis not present

## 2021-05-10 DIAGNOSIS — C20 Malignant neoplasm of rectum: Secondary | ICD-10-CM | POA: Diagnosis not present

## 2021-05-10 DIAGNOSIS — Z800 Family history of malignant neoplasm of digestive organs: Secondary | ICD-10-CM | POA: Diagnosis not present

## 2021-05-10 DIAGNOSIS — D259 Leiomyoma of uterus, unspecified: Secondary | ICD-10-CM | POA: Diagnosis present

## 2021-05-10 DIAGNOSIS — Z885 Allergy status to narcotic agent status: Secondary | ICD-10-CM | POA: Diagnosis not present

## 2021-05-10 DIAGNOSIS — Z86718 Personal history of other venous thrombosis and embolism: Secondary | ICD-10-CM

## 2021-05-10 DIAGNOSIS — Z01818 Encounter for other preprocedural examination: Secondary | ICD-10-CM

## 2021-05-10 DIAGNOSIS — Z932 Ileostomy status: Principal | ICD-10-CM

## 2021-05-10 DIAGNOSIS — K6389 Other specified diseases of intestine: Secondary | ICD-10-CM | POA: Diagnosis not present

## 2021-05-10 HISTORY — PX: FLEXIBLE SIGMOIDOSCOPY: SHX5431

## 2021-05-10 HISTORY — PX: XI ROBOTIC ASSISTED LOWER ANTERIOR RESECTION: SHX6558

## 2021-05-10 LAB — TYPE AND SCREEN
ABO/RH(D): O POS
Antibody Screen: NEGATIVE

## 2021-05-10 LAB — ABO/RH: ABO/RH(D): O POS

## 2021-05-10 LAB — PREGNANCY, URINE: Preg Test, Ur: NEGATIVE

## 2021-05-10 SURGERY — RESECTION, RECTUM, LOW ANTERIOR, ROBOT-ASSISTED
Anesthesia: General

## 2021-05-10 MED ORDER — ALVIMOPAN 12 MG PO CAPS
12.0000 mg | ORAL_CAPSULE | ORAL | Status: AC
  Administered 2021-05-10: 12 mg via ORAL
  Filled 2021-05-10: qty 1

## 2021-05-10 MED ORDER — LIDOCAINE HCL (PF) 2 % IJ SOLN
INTRAMUSCULAR | Status: AC
  Filled 2021-05-10: qty 5

## 2021-05-10 MED ORDER — ACETAMINOPHEN 500 MG PO TABS: 1000.0000 mg | ORAL_TABLET | Freq: Once | ORAL | Status: DC

## 2021-05-10 MED ORDER — BUPIVACAINE LIPOSOME 1.3 % IJ SUSP
20.0000 mL | Freq: Once | INTRAMUSCULAR | Status: DC
Start: 2021-05-10 — End: 2021-05-10

## 2021-05-10 MED ORDER — BISACODYL 5 MG PO TBEC: 20.0000 mg | DELAYED_RELEASE_TABLET | Freq: Once | ORAL | Status: DC

## 2021-05-10 MED ORDER — ROCURONIUM BROMIDE 10 MG/ML (PF) SYRINGE
PREFILLED_SYRINGE | INTRAVENOUS | Status: DC | PRN
  Administered 2021-05-10: 100 mg via INTRAVENOUS
  Administered 2021-05-10: 20 mg via INTRAVENOUS

## 2021-05-10 MED ORDER — BUPIVACAINE-EPINEPHRINE (PF) 0.25% -1:200000 IJ SOLN
INTRAMUSCULAR | Status: DC | PRN
  Administered 2021-05-10: 30 mL via PERINEURAL

## 2021-05-10 MED ORDER — HYDROMORPHONE HCL 1 MG/ML IJ SOLN: 0.5000 mg | INTRAMUSCULAR | Status: DC | PRN

## 2021-05-10 MED ORDER — HYDRALAZINE HCL 20 MG/ML IJ SOLN: 10.0000 mg | INTRAMUSCULAR | Status: DC | PRN

## 2021-05-10 MED ORDER — ENSURE PRE-SURGERY PO LIQD
296.0000 mL | Freq: Once | ORAL | Status: DC
Start: 2021-05-10 — End: 2021-05-10
  Filled 2021-05-10: qty 296

## 2021-05-10 MED ORDER — PHENYLEPHRINE HCL-NACL 20-0.9 MG/250ML-% IV SOLN
INTRAVENOUS | Status: AC
  Filled 2021-05-10: qty 250

## 2021-05-10 MED ORDER — LABETALOL HCL 5 MG/ML IV SOLN
INTRAVENOUS | Status: AC
  Filled 2021-05-10: qty 4

## 2021-05-10 MED ORDER — ONDANSETRON HCL 4 MG/2ML IJ SOLN
4.0000 mg | Freq: Four times a day (QID) | INTRAMUSCULAR | Status: DC | PRN
Start: 2021-05-10 — End: 2021-05-14

## 2021-05-10 MED ORDER — BUPIVACAINE LIPOSOME 1.3 % IJ SUSP
INTRAMUSCULAR | Status: AC
  Filled 2021-05-10: qty 20

## 2021-05-10 MED ORDER — ORAL CARE MOUTH RINSE: 15.0000 mL | Freq: Once | OROMUCOSAL | Status: AC

## 2021-05-10 MED ORDER — FENTANYL CITRATE PF 50 MCG/ML IJ SOSY
PREFILLED_SYRINGE | INTRAMUSCULAR | Status: AC
  Administered 2021-05-10: 50 ug via INTRAVENOUS
  Filled 2021-05-10: qty 1

## 2021-05-10 MED ORDER — ONDANSETRON HCL 4 MG PO TABS
4.0000 mg | ORAL_TABLET | Freq: Four times a day (QID) | ORAL | Status: DC | PRN
  Filled 2021-05-10: qty 1

## 2021-05-10 MED ORDER — ROCURONIUM BROMIDE 10 MG/ML (PF) SYRINGE
PREFILLED_SYRINGE | INTRAVENOUS | Status: AC
  Filled 2021-05-10: qty 10

## 2021-05-10 MED ORDER — ONDANSETRON HCL 4 MG/2ML IJ SOLN
INTRAMUSCULAR | Status: AC
  Filled 2021-05-10: qty 2

## 2021-05-10 MED ORDER — IBUPROFEN 400 MG PO TABS
600.0000 mg | ORAL_TABLET | Freq: Four times a day (QID) | ORAL | Status: DC | PRN
  Administered 2021-05-10 – 2021-05-13 (×3): 600 mg via ORAL
  Filled 2021-05-10 (×3): qty 1

## 2021-05-10 MED ORDER — FENTANYL CITRATE (PF) 250 MCG/5ML IJ SOLN
INTRAMUSCULAR | Status: AC
  Filled 2021-05-10: qty 5

## 2021-05-10 MED ORDER — AMISULPRIDE (ANTIEMETIC) 5 MG/2ML IV SOLN: 10.0000 mg | Freq: Once | INTRAVENOUS | Status: DC | PRN

## 2021-05-10 MED ORDER — LABETALOL HCL 5 MG/ML IV SOLN
INTRAVENOUS | Status: DC | PRN
  Administered 2021-05-10: 2.5 mg via INTRAVENOUS

## 2021-05-10 MED ORDER — SCOPOLAMINE 1 MG/3DAYS TD PT72
1.0000 | MEDICATED_PATCH | TRANSDERMAL | Status: DC
  Administered 2021-05-10: 1.5 mg via TRANSDERMAL
  Filled 2021-05-10: qty 1

## 2021-05-10 MED ORDER — BUPIVACAINE LIPOSOME 1.3 % IJ SUSP
INTRAMUSCULAR | Status: DC | PRN
  Administered 2021-05-10: 20 mL

## 2021-05-10 MED ORDER — CHLORHEXIDINE GLUCONATE CLOTH 2 % EX PADS: 6.0000 | MEDICATED_PAD | Freq: Once | CUTANEOUS | Status: DC

## 2021-05-10 MED ORDER — ENSURE SURGERY PO LIQD: 237.0000 mL | Freq: Two times a day (BID) | ORAL | Status: DC

## 2021-05-10 MED ORDER — PHENYLEPHRINE HCL-NACL 20-0.9 MG/250ML-% IV SOLN
INTRAVENOUS | Status: DC | PRN
  Administered 2021-05-10: 15 ug/min via INTRAVENOUS

## 2021-05-10 MED ORDER — PROPOFOL 10 MG/ML IV BOLUS
INTRAVENOUS | Status: AC
  Filled 2021-05-10: qty 20

## 2021-05-10 MED ORDER — ENSURE PRE-SURGERY PO LIQD
592.0000 mL | Freq: Once | ORAL | Status: DC
Start: 2021-05-10 — End: 2021-05-10
  Filled 2021-05-10: qty 592

## 2021-05-10 MED ORDER — MIDAZOLAM HCL 2 MG/2ML IJ SOLN
INTRAMUSCULAR | Status: AC
  Filled 2021-05-10: qty 2

## 2021-05-10 MED ORDER — SODIUM CHLORIDE (PF) 0.9 % IJ SOLN
INTRAMUSCULAR | Status: AC
  Filled 2021-05-10: qty 10

## 2021-05-10 MED ORDER — LIDOCAINE 2% (20 MG/ML) 5 ML SYRINGE
INTRAMUSCULAR | Status: DC | PRN
  Administered 2021-05-10: 100 mg via INTRAVENOUS
  Administered 2021-05-10: 1.5 mg/kg/h via INTRAVENOUS

## 2021-05-10 MED ORDER — ACETAMINOPHEN 500 MG PO TABS
1000.0000 mg | ORAL_TABLET | ORAL | Status: AC
  Administered 2021-05-10: 1000 mg via ORAL
  Filled 2021-05-10: qty 2

## 2021-05-10 MED ORDER — DEXAMETHASONE SODIUM PHOSPHATE 10 MG/ML IJ SOLN
INTRAMUSCULAR | Status: DC | PRN
  Administered 2021-05-10: 10 mg via INTRAVENOUS

## 2021-05-10 MED ORDER — BUPIVACAINE-EPINEPHRINE (PF) 0.25% -1:200000 IJ SOLN
INTRAMUSCULAR | Status: AC
  Filled 2021-05-10: qty 30

## 2021-05-10 MED ORDER — HEPARIN SODIUM (PORCINE) 5000 UNIT/ML IJ SOLN
5000.0000 [IU] | Freq: Three times a day (TID) | INTRAMUSCULAR | Status: DC
  Administered 2021-05-10 – 2021-05-14 (×13): 5000 [IU] via SUBCUTANEOUS
  Filled 2021-05-10 (×13): qty 1

## 2021-05-10 MED ORDER — TRAMADOL HCL 50 MG PO TABS
50.0000 mg | ORAL_TABLET | Freq: Four times a day (QID) | ORAL | Status: DC | PRN
  Administered 2021-05-10: 50 mg via ORAL
  Filled 2021-05-10: qty 1

## 2021-05-10 MED ORDER — LACTATED RINGERS IV SOLN
INTRAVENOUS | Status: DC
Start: 2021-05-10 — End: 2021-05-10

## 2021-05-10 MED ORDER — FENTANYL CITRATE PF 50 MCG/ML IJ SOSY
25.0000 ug | PREFILLED_SYRINGE | INTRAMUSCULAR | Status: DC | PRN
  Administered 2021-05-10: 50 ug via INTRAVENOUS

## 2021-05-10 MED ORDER — ONDANSETRON HCL 4 MG/2ML IJ SOLN
INTRAMUSCULAR | Status: DC | PRN
  Administered 2021-05-10: 4 mg via INTRAVENOUS

## 2021-05-10 MED ORDER — LIP MEDEX EX OINT
TOPICAL_OINTMENT | CUTANEOUS | Status: DC | PRN
  Filled 2021-05-10: qty 7

## 2021-05-10 MED ORDER — ALUM & MAG HYDROXIDE-SIMETH 200-200-20 MG/5ML PO SUSP: 30.0000 mL | Freq: Four times a day (QID) | ORAL | Status: DC | PRN

## 2021-05-10 MED ORDER — MELATONIN 5 MG PO TABS: 5.0000 mg | ORAL_TABLET | Freq: Every evening | ORAL | Status: DC | PRN

## 2021-05-10 MED ORDER — SUGAMMADEX SODIUM 200 MG/2ML IV SOLN
INTRAVENOUS | Status: DC | PRN
Start: 2021-05-10 — End: 2021-05-10
  Administered 2021-05-10: 200 mg via INTRAVENOUS

## 2021-05-10 MED ORDER — DIPHENHYDRAMINE HCL 50 MG/ML IJ SOLN: 12.5000 mg | Freq: Four times a day (QID) | INTRAMUSCULAR | Status: DC | PRN

## 2021-05-10 MED ORDER — PROPOFOL 10 MG/ML IV BOLUS
INTRAVENOUS | Status: DC | PRN
  Administered 2021-05-10: 170 mg via INTRAVENOUS

## 2021-05-10 MED ORDER — SODIUM CHLORIDE 0.9 % IV SOLN
2.0000 g | INTRAVENOUS | Status: AC
  Administered 2021-05-10: 2 g via INTRAVENOUS
  Filled 2021-05-10: qty 2

## 2021-05-10 MED ORDER — POLYETHYLENE GLYCOL 3350 17 GM/SCOOP PO POWD: 1.0000 | Freq: Once | ORAL | Status: DC

## 2021-05-10 MED ORDER — FENTANYL CITRATE (PF) 100 MCG/2ML IJ SOLN
INTRAMUSCULAR | Status: AC
  Filled 2021-05-10: qty 2

## 2021-05-10 MED ORDER — LACTATED RINGERS IV SOLN
INTRAVENOUS | Status: DC | PRN
Start: 2021-05-10 — End: 2021-05-10

## 2021-05-10 MED ORDER — METRONIDAZOLE 500 MG PO TABS
1000.0000 mg | ORAL_TABLET | ORAL | Status: DC
Start: 2021-05-10 — End: 2021-05-10

## 2021-05-10 MED ORDER — LIDOCAINE HCL 2 % IJ SOLN
INTRAMUSCULAR | Status: AC
  Filled 2021-05-10: qty 20

## 2021-05-10 MED ORDER — MIDAZOLAM HCL 5 MG/5ML IJ SOLN
INTRAMUSCULAR | Status: DC | PRN
  Administered 2021-05-10: 2 mg via INTRAVENOUS

## 2021-05-10 MED ORDER — ALVIMOPAN 12 MG PO CAPS
12.0000 mg | ORAL_CAPSULE | Freq: Two times a day (BID) | ORAL | Status: DC
  Administered 2021-05-11: 12 mg via ORAL
  Filled 2021-05-10 (×2): qty 1

## 2021-05-10 MED ORDER — FENTANYL CITRATE PF 50 MCG/ML IJ SOSY
PREFILLED_SYRINGE | INTRAMUSCULAR | Status: AC
  Filled 2021-05-10: qty 1

## 2021-05-10 MED ORDER — CHLORHEXIDINE GLUCONATE CLOTH 2 % EX PADS
6.0000 | MEDICATED_PAD | Freq: Once | CUTANEOUS | Status: DC
Start: 2021-05-10 — End: 2021-05-10

## 2021-05-10 MED ORDER — SPY AGENT GREEN - (INDOCYANINE FOR INJECTION)
INTRAMUSCULAR | Status: DC | PRN
  Administered 2021-05-10: 1 mL via INTRAVENOUS

## 2021-05-10 MED ORDER — DIPHENHYDRAMINE HCL 12.5 MG/5ML PO ELIX: 12.5000 mg | ORAL_SOLUTION | Freq: Four times a day (QID) | ORAL | Status: DC | PRN

## 2021-05-10 MED ORDER — ALBUMIN HUMAN 5 % IV SOLN
INTRAVENOUS | Status: AC
  Filled 2021-05-10: qty 250

## 2021-05-10 MED ORDER — NEOMYCIN SULFATE 500 MG PO TABS
1000.0000 mg | ORAL_TABLET | ORAL | Status: DC
Start: 2021-05-10 — End: 2021-05-10

## 2021-05-10 MED ORDER — SIMETHICONE 80 MG PO CHEW
40.0000 mg | CHEWABLE_TABLET | Freq: Four times a day (QID) | ORAL | Status: DC | PRN
  Administered 2021-05-12 – 2021-05-14 (×4): 40 mg via ORAL
  Filled 2021-05-10 (×4): qty 1

## 2021-05-10 MED ORDER — FENTANYL CITRATE PF 50 MCG/ML IJ SOSY: 25.0000 ug | PREFILLED_SYRINGE | INTRAMUSCULAR | Status: DC | PRN

## 2021-05-10 MED ORDER — FENTANYL CITRATE PF 50 MCG/ML IJ SOSY
PREFILLED_SYRINGE | INTRAMUSCULAR | Status: AC
Start: 2021-05-10 — End: 2021-05-10
  Administered 2021-05-10: 50 ug via INTRAVENOUS
  Filled 2021-05-10: qty 1

## 2021-05-10 MED ORDER — HEPARIN SODIUM (PORCINE) 5000 UNIT/ML IJ SOLN
5000.0000 [IU] | Freq: Once | INTRAMUSCULAR | Status: AC
  Administered 2021-05-10: 5000 [IU] via SUBCUTANEOUS
  Filled 2021-05-10: qty 1

## 2021-05-10 MED ORDER — CHLORHEXIDINE GLUCONATE 0.12 % MT SOLN
15.0000 mL | Freq: Once | OROMUCOSAL | Status: AC
  Administered 2021-05-10: 15 mL via OROMUCOSAL

## 2021-05-10 MED ORDER — EPHEDRINE 5 MG/ML INJ
INTRAVENOUS | Status: AC
  Filled 2021-05-10: qty 5

## 2021-05-10 MED ORDER — DEXAMETHASONE SODIUM PHOSPHATE 10 MG/ML IJ SOLN
INTRAMUSCULAR | Status: AC
  Filled 2021-05-10: qty 1

## 2021-05-10 MED ORDER — FENTANYL CITRATE (PF) 250 MCG/5ML IJ SOLN
INTRAMUSCULAR | Status: DC | PRN
  Administered 2021-05-10 (×3): 50 ug via INTRAVENOUS
  Administered 2021-05-10: 100 ug via INTRAVENOUS
  Administered 2021-05-10 (×2): 50 ug via INTRAVENOUS

## 2021-05-10 MED ORDER — CYCLOBENZAPRINE HCL 10 MG PO TABS
10.0000 mg | ORAL_TABLET | Freq: Three times a day (TID) | ORAL | Status: DC | PRN
  Administered 2021-05-10 – 2021-05-14 (×5): 10 mg via ORAL
  Filled 2021-05-10 (×5): qty 1

## 2021-05-10 MED ORDER — CELECOXIB 200 MG PO CAPS
200.0000 mg | ORAL_CAPSULE | Freq: Once | ORAL | Status: AC
  Administered 2021-05-10: 200 mg via ORAL
  Filled 2021-05-10: qty 1

## 2021-05-10 MED ORDER — LACTATED RINGERS IV SOLN: INTRAVENOUS | Status: DC

## 2021-05-10 MED ORDER — ACETAMINOPHEN 500 MG PO TABS
1000.0000 mg | ORAL_TABLET | Freq: Four times a day (QID) | ORAL | Status: DC
  Administered 2021-05-10 – 2021-05-11 (×3): 1000 mg via ORAL
  Filled 2021-05-10 (×3): qty 2

## 2021-05-10 SURGICAL SUPPLY — 124 items
ADH SKN CLS APL DERMABOND .7 (GAUZE/BANDAGES/DRESSINGS) ×1
APL PRP STRL LF DISP 70% ISPRP (MISCELLANEOUS) ×1
APPLIER CLIP 5 13 M/L LIGAMAX5 (MISCELLANEOUS)
APPLIER CLIP ROT 10 11.4 M/L (STAPLE)
APR CLP MED LRG 11.4X10 (STAPLE)
APR CLP MED LRG 5 ANG JAW (MISCELLANEOUS)
BAG COUNTER SPONGE SURGICOUNT (BAG) IMPLANT
BAG SPNG CNTER NS LX DISP (BAG)
BLADE EXTENDED COATED 6.5IN (ELECTRODE) ×2 IMPLANT
CANNULA REDUC XI 12-8 STAPL (CANNULA) ×2
CANNULA REDUCER 12-8 DVNC XI (CANNULA) ×1 IMPLANT
CELLS DAT CNTRL 66122 CELL SVR (MISCELLANEOUS) IMPLANT
CHLORAPREP W/TINT 26 (MISCELLANEOUS) ×2 IMPLANT
CLIP APPLIE 5 13 M/L LIGAMAX5 (MISCELLANEOUS) IMPLANT
CLIP APPLIE ROT 10 11.4 M/L (STAPLE) IMPLANT
CLIP LIGATING HEM O LOK PURPLE (MISCELLANEOUS) IMPLANT
CLIP LIGATING HEMO O LOK GREEN (MISCELLANEOUS) IMPLANT
COVER SURGICAL LIGHT HANDLE (MISCELLANEOUS) ×4 IMPLANT
COVER TIP SHEARS 8 DVNC (MISCELLANEOUS) ×1 IMPLANT
COVER TIP SHEARS 8MM DA VINCI (MISCELLANEOUS) ×2
DEFOGGER SCOPE WARMER CLEARIFY (MISCELLANEOUS) ×2 IMPLANT
DERMABOND ADVANCED (GAUZE/BANDAGES/DRESSINGS) ×1
DERMABOND ADVANCED .7 DNX12 (GAUZE/BANDAGES/DRESSINGS) IMPLANT
DEVICE TROCAR PUNCTURE CLOSURE (ENDOMECHANICALS) IMPLANT
DRAIN CHANNEL 19F RND (DRAIN) ×2 IMPLANT
DRAPE ARM DVNC X/XI (DISPOSABLE) ×4 IMPLANT
DRAPE COLUMN DVNC XI (DISPOSABLE) ×1 IMPLANT
DRAPE DA VINCI XI ARM (DISPOSABLE) ×8
DRAPE DA VINCI XI COLUMN (DISPOSABLE) ×2
DRAPE SURG IRRIG POUCH 19X23 (DRAPES) ×2 IMPLANT
DRSG OPSITE POSTOP 3X4 (GAUZE/BANDAGES/DRESSINGS) ×1 IMPLANT
DRSG OPSITE POSTOP 4X10 (GAUZE/BANDAGES/DRESSINGS) IMPLANT
DRSG OPSITE POSTOP 4X6 (GAUZE/BANDAGES/DRESSINGS) IMPLANT
DRSG OPSITE POSTOP 4X8 (GAUZE/BANDAGES/DRESSINGS) IMPLANT
DRSG TEGADERM 2-3/8X2-3/4 SM (GAUZE/BANDAGES/DRESSINGS) ×10 IMPLANT
DRSG TEGADERM 4X4.75 (GAUZE/BANDAGES/DRESSINGS) ×2 IMPLANT
ELECT REM PT RETURN 15FT ADLT (MISCELLANEOUS) ×2 IMPLANT
ENDOLOOP SUT PDS II  0 18 (SUTURE)
ENDOLOOP SUT PDS II 0 18 (SUTURE) IMPLANT
EVACUATOR SILICONE 100CC (DRAIN) ×2 IMPLANT
GAUZE SPONGE 2X2 8PLY STRL LF (GAUZE/BANDAGES/DRESSINGS) ×1 IMPLANT
GAUZE SPONGE 4X4 12PLY STRL (GAUZE/BANDAGES/DRESSINGS) IMPLANT
GLOVE SURG ENC MOIS LTX SZ7.5 (GLOVE) ×6 IMPLANT
GLOVE SURG UNDER LTX SZ8 (GLOVE) ×6 IMPLANT
GOWN STRL REUS W/ TWL XL LVL3 (GOWN DISPOSABLE) ×5 IMPLANT
GOWN STRL REUS W/TWL XL LVL3 (GOWN DISPOSABLE) ×10
GRASPER SUT TROCAR 14GX15 (MISCELLANEOUS) IMPLANT
HOLDER FOLEY CATH W/STRAP (MISCELLANEOUS) ×2 IMPLANT
IRRIG SUCT STRYKERFLOW 2 WTIP (MISCELLANEOUS) ×2
IRRIGATION SUCT STRKRFLW 2 WTP (MISCELLANEOUS) ×1 IMPLANT
KIT PROCEDURE DA VINCI SI (MISCELLANEOUS)
KIT PROCEDURE DVNC SI (MISCELLANEOUS) IMPLANT
KIT TURNOVER KIT A (KITS) IMPLANT
NDL INSUFFLATION 14GA 120MM (NEEDLE) ×1 IMPLANT
NEEDLE INSUFFLATION 14GA 120MM (NEEDLE) ×2 IMPLANT
PACK CARDIOVASCULAR III (CUSTOM PROCEDURE TRAY) ×2 IMPLANT
PACK COLON (CUSTOM PROCEDURE TRAY) ×2 IMPLANT
PAD POSITIONING PINK XL (MISCELLANEOUS) ×2 IMPLANT
PENCIL SMOKE EVACUATOR (MISCELLANEOUS) IMPLANT
PROTECTOR NERVE ULNAR (MISCELLANEOUS) ×4 IMPLANT
RELOAD STAPLE 45 3.5 BLU DVNC (STAPLE) IMPLANT
RELOAD STAPLE 45 4.3 GRN DVNC (STAPLE) IMPLANT
RELOAD STAPLE 60 3.5 BLU DVNC (STAPLE) IMPLANT
RELOAD STAPLE 60 4.3 GRN DVNC (STAPLE) IMPLANT
RELOAD STAPLER 3.5X45 BLU DVNC (STAPLE) IMPLANT
RELOAD STAPLER 3.5X60 BLU DVNC (STAPLE) IMPLANT
RELOAD STAPLER 4.3X45 GRN DVNC (STAPLE) IMPLANT
RELOAD STAPLER 4.3X60 GRN DVNC (STAPLE) ×3 IMPLANT
RETRACTOR WND ALEXIS 18 MED (MISCELLANEOUS) IMPLANT
RTRCTR WOUND ALEXIS 18CM MED (MISCELLANEOUS)
SCISSORS LAP 5X35 DISP (ENDOMECHANICALS) IMPLANT
SEAL CANN UNIV 5-8 DVNC XI (MISCELLANEOUS) ×4 IMPLANT
SEAL XI 5MM-8MM UNIVERSAL (MISCELLANEOUS) ×8
SEALER VESSEL DA VINCI XI (MISCELLANEOUS) ×2
SEALER VESSEL EXT DVNC XI (MISCELLANEOUS) ×1 IMPLANT
SLEEVE ADV FIXATION 5X100MM (TROCAR) IMPLANT
SOLUTION ELECTROLUBE (MISCELLANEOUS) ×2 IMPLANT
SPIKE FLUID TRANSFER (MISCELLANEOUS) ×2 IMPLANT
SPONGE GAUZE 2X2 STER 10/PKG (GAUZE/BANDAGES/DRESSINGS) ×1
STAPLER 60 DA VINCI SURE FORM (STAPLE) ×2
STAPLER 60 SUREFORM DVNC (STAPLE) IMPLANT
STAPLER CANNULA SEAL DVNC XI (STAPLE) ×1 IMPLANT
STAPLER CANNULA SEAL XI (STAPLE) ×2
STAPLER ECHELON POWER CIR 29 (STAPLE) IMPLANT
STAPLER ECHELON POWER CIR 31 (STAPLE) ×1 IMPLANT
STAPLER RELOAD 3.5X45 BLU DVNC (STAPLE)
STAPLER RELOAD 3.5X45 BLUE (STAPLE)
STAPLER RELOAD 3.5X60 BLU DVNC (STAPLE)
STAPLER RELOAD 3.5X60 BLUE (STAPLE)
STAPLER RELOAD 4.3X45 GREEN (STAPLE)
STAPLER RELOAD 4.3X45 GRN DVNC (STAPLE)
STAPLER RELOAD 4.3X60 GREEN (STAPLE) ×6
STAPLER RELOAD 4.3X60 GRN DVNC (STAPLE) ×3
STOPCOCK 4 WAY LG BORE MALE ST (IV SETS) ×4 IMPLANT
SURGILUBE 2OZ TUBE FLIPTOP (MISCELLANEOUS) ×2 IMPLANT
SUT MNCRL AB 4-0 PS2 18 (SUTURE) ×2 IMPLANT
SUT PDS AB 1 CT1 27 (SUTURE) IMPLANT
SUT PDS AB 1 TP1 96 (SUTURE) IMPLANT
SUT PROLENE 0 CT 2 (SUTURE) IMPLANT
SUT PROLENE 2 0 KS (SUTURE) ×2 IMPLANT
SUT PROLENE 2 0 SH DA (SUTURE) IMPLANT
SUT SILK 2 0 (SUTURE)
SUT SILK 2 0 SH CR/8 (SUTURE) IMPLANT
SUT SILK 2-0 18XBRD TIE 12 (SUTURE) IMPLANT
SUT SILK 3 0 (SUTURE) ×2
SUT SILK 3 0 SH CR/8 (SUTURE) ×2 IMPLANT
SUT SILK 3-0 18XBRD TIE 12 (SUTURE) ×1 IMPLANT
SUT V-LOC BARB 180 2/0GR6 GS22 (SUTURE)
SUT VIC AB 3-0 SH 18 (SUTURE) IMPLANT
SUT VIC AB 3-0 SH 27 (SUTURE)
SUT VIC AB 3-0 SH 27XBRD (SUTURE) IMPLANT
SUT VICRYL 0 UR6 27IN ABS (SUTURE) ×2 IMPLANT
SUTURE V-LC BRB 180 2/0GR6GS22 (SUTURE) IMPLANT
SYR 10ML LL (SYRINGE) ×2 IMPLANT
SYS LAPSCP GELPORT 120MM (MISCELLANEOUS)
SYS WOUND ALEXIS 18CM MED (MISCELLANEOUS) ×2
SYSTEM LAPSCP GELPORT 120MM (MISCELLANEOUS) IMPLANT
SYSTEM WOUND ALEXIS 18CM MED (MISCELLANEOUS) ×1 IMPLANT
TAPE UMBILICAL 1/8 X36 TWILL (MISCELLANEOUS) ×2 IMPLANT
TOWEL OR NON WOVEN STRL DISP B (DISPOSABLE) ×2 IMPLANT
TRAY FOLEY MTR SLVR 16FR STAT (SET/KITS/TRAYS/PACK) ×2 IMPLANT
TROCAR ADV FIXATION 5X100MM (TROCAR) ×2 IMPLANT
TUBING CONNECTING 10 (TUBING) ×4 IMPLANT
TUBING INSUFFLATION 10FT LAP (TUBING) ×2 IMPLANT

## 2021-05-10 NOTE — Anesthesia Procedure Notes (Signed)
Procedure Name: Intubation ?Date/Time: 05/10/2021 8:35 AM ?Performed by: Victoriano Lain, CRNA ?Pre-anesthesia Checklist: Patient identified, Emergency Drugs available, Suction available, Patient being monitored and Timeout performed ?Patient Re-evaluated:Patient Re-evaluated prior to induction ?Oxygen Delivery Method: Circle system utilized ?Preoxygenation: Pre-oxygenation with 100% oxygen ?Induction Type: IV induction ?Ventilation: Mask ventilation without difficulty ?Laryngoscope Size: Mac and 4 ?Grade View: Grade I ?Tube type: Oral ?Tube size: 7.5 mm ?Number of attempts: 1 ?Airway Equipment and Method: Stylet ?Placement Confirmation: ETT inserted through vocal cords under direct vision, positive ETCO2 and breath sounds checked- equal and bilateral ?Secured at: 2 cm ?Tube secured with: Tape ?Dental Injury: Teeth and Oropharynx as per pre-operative assessment  ? ? ? ? ?

## 2021-05-10 NOTE — Transfer of Care (Signed)
Immediate Anesthesia Transfer of Care Note ? ?Patient: KIERYN BURTIS ? ?Procedure(s) Performed: XI ROBOTIC ASSISTED LOWER ANTERIOR RESECTION WITH DIVERTING LOOP ILEOSTOMY, INTRAOPERATIVE PERFUSION ASSESSMENT WITH FIREFLY INJECTION ?FLEXIBLE SIGMOIDOSCOPY ? ?Patient Location: PACU ? ?Anesthesia Type:General ? ?Level of Consciousness: awake, alert , oriented and patient cooperative ? ?Airway & Oxygen Therapy: Patient Spontanous Breathing and Patient connected to face mask oxygen ? ?Post-op Assessment: Report given to RN, Post -op Vital signs reviewed and stable and Patient moving all extremities ? ?Post vital signs: Reviewed and stable ? ?Last Vitals:  ?Vitals Value Taken Time  ?BP 135/88 05/10/21 1305  ?Temp    ?Pulse 113 05/10/21 1310  ?Resp 13 05/10/21 1310  ?SpO2 100 % 05/10/21 1310  ?Vitals shown include unvalidated device data. ? ?Last Pain:  ?Vitals:  ? 05/10/21 0650  ?PainSc: 0-No pain  ?   ? ?  ? ?Complications: No notable events documented. ?

## 2021-05-10 NOTE — Anesthesia Postprocedure Evaluation (Signed)
Anesthesia Post Note ? ?Patient: Sharon Bennett ? ?Procedure(s) Performed: XI ROBOTIC ASSISTED LOWER ANTERIOR RESECTION WITH DIVERTING LOOP ILEOSTOMY, INTRAOPERATIVE PERFUSION ASSESSMENT WITH FIREFLY INJECTION ?FLEXIBLE SIGMOIDOSCOPY ? ?  ? ?Patient location during evaluation: PACU ?Anesthesia Type: General ?Level of consciousness: sedated ?Pain management: pain level controlled ?Vital Signs Assessment: post-procedure vital signs reviewed and stable ?Respiratory status: spontaneous breathing and respiratory function stable ?Cardiovascular status: stable ?Postop Assessment: no apparent nausea or vomiting ?Anesthetic complications: no ? ? ?No notable events documented. ? ?Last Vitals:  ?Vitals:  ? 05/10/21 1430 05/10/21 1445  ?BP: 127/90 126/85  ?Pulse: 98 (!) 101  ?Resp: 14 11  ?Temp:    ?SpO2: 98% 100%  ?  ?Last Pain:  ?Vitals:  ? 05/10/21 1400  ?PainSc: 5   ? ? ?  ?  ?  ?  ?  ?  ? ?Ame Heagle DANIEL ? ? ? ? ?

## 2021-05-10 NOTE — Discharge Instructions (Signed)
POST OP INSTRUCTIONS AFTER COLON SURGERY ? ?DIET: Be sure to include lots of fluids daily to stay hydrated - 64oz of water per day (8, 8 oz glasses).  Avoid fast food or heavy meals for the first couple of weeks as your are more likely to get nauseated. Avoid raw/uncooked fruits or vegetables for the first 4 weeks (its ok to have these if they are blended into smoothie form). If you have fruits/vegetables, make sure they are cooked until soft enough to mash on the roof of your mouth and chew your food well. Otherwise, diet as tolerated. ? ?Take your usually prescribed home medications unless otherwise directed. ? ?PAIN CONTROL: ?Pain is best controlled by a usual combination of three different methods TOGETHER: ?Ice/Heat ?Over the counter pain medication ?Prescription pain medication ?Most patients will experience some swelling and bruising around the surgical site.  Ice packs or heating pads (30-60 minutes up to 6 times a day) will help. Some people prefer to use ice alone, heat alone, alternating between ice & heat.  Experiment to what works for you.  Swelling and bruising can take several weeks to resolve.   ?It is helpful to take an over-the-counter pain medication regularly for the first few weeks: ?Ibuprofen (Motrin/Advil) - '200mg'$  tabs - take 3 tabs ('600mg'$ ) every 6 hours as needed for pain (unless you have been directed previously to avoid NSAIDs/ibuprofen) ?Acetaminophen (Tylenol) - you may take '650mg'$  every 6 hours as needed. You can take this with motrin as they act differently on the body. If you are taking a narcotic pain medication that has acetaminophen in it, do not take over the counter tylenol at the same time. ?NOTE: You may take both of these medications together - most patients  find it most helpful when alternating between the two (i.e. Ibuprofen at 6am, tylenol at 9am, ibuprofen at 12pm ...) ?A  prescription for pain medication should be given to you upon discharge.  Take your pain medication as  prescribed if your pain is not adequatly controlled with the over-the-counter pain reliefs mentioned above. ? ?Ileostomy: Drink plenty of water/low sugar gatorade to help maintain hydration (at least 64+oz/day). Continue to empty/record output from ileostomy. Your output should generally be toothpaste consistency. Volume should remain < 1.2 L in 24 hrs. If higher, you are at increased risk for dehydration and need to let us know. Higher sugar items can increase the volume including high fructose corn syrup, non-diet soda, ice cream, candy, etc and these should be limited if possible. Taking metamucil once/twice daily helps thicken the output. Oftentimes patients may require Imodium to assist in getting the output under control; this is generally temporary and something that can be weaned off after the first 4-6 weeks. Keep [place] in the loop with output volumes if > 1.2L / 24 hrs so we can help titrate your medication.  ? ?Dressing: Your incisions are covered in Dermabond which is like sterile superglue for the skin. This will come off on it's own in a couple weeks. It is waterproof and you may bathe normally starting the day after your surgery in a shower. Avoid baths/pools/lakes/oceans until your wounds have fully healed. ? ?ACTIVITIES as tolerated:   ?Avoid heavy lifting (>10lbs or 1 gallon of milk) for the next 6 weeks. ?You may resume regular daily activities as tolerated--such as daily self-care, walking, climbing stairs--gradually increasing activities as tolerated.  If you can walk 30 minutes without difficulty, it is safe to try more intense activity such as  jogging, treadmill, bicycling, low-impact aerobics.  ?DO NOT PUSH THROUGH PAIN.  Let pain be your guide: If it hurts to do something, don't do it. ?You may drive when you are no longer taking prescription pain medication, you can comfortably wear a seatbelt, and you can safely maneuver your car and apply brakes. ? ?FOLLOW UP in our office ?Please call [facility]  at [phone] to set up an appointment to see your surgeon in the office for a follow-up appointment approximately 2 weeks after your surgery. ?Make sure that you call for this appointment the day you arrive home to insure a convenient appointment time. ? ?9. If you have disability or family leave forms that need to be completed, you may have them completed by your primary care physician's office; for return to work instructions, please ask our office staff and they will be happy to assist you in obtaining this documentation ?  ?When to call us [phone]: ?Poor pain control ?Reactions / problems with new medications (rash/itching, etc)  ?Fever over 101.5 F (38.5 C) ?Inability to urinate ?Nausea/vomiting ?Worsening swelling or bruising ?Continued bleeding from incision. ?Increased pain, redness, or drainage from the incision ? ?The clinic staff is available to answer your questions during regular business hours (8:30am-5pm).  Please don?t hesitate to call and ask to speak to one of our nurses for clinical concerns.   A surgeon from [facility] is always on call at the hospitals ?  ?If you have a medical emergency, go to the nearest emergency room or call 911. ? ?[facility], [place] ?[address] [place], [place], [place]  [address] ?MAIN: [phone] ?FAX: [phone] ?[email or web address]  ?

## 2021-05-10 NOTE — H&P (Addendum)
? ?CC: Here today for surgery ? ?HPI: ?Sharon Bennett is an 41 y.o. female with no known medical hx whom is seen in the office today for follow-up evaluation of rectal cancer ? ?Colonoscopy with Dr. Benson Norway 08/23/20 - nonobstructing large mass in rectum 5 cm in length estimated 7 cm from anal verge. 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 No evident mismatch repair deficiencies. ? ?CT CAP 7/17 & 7/28 shows mass in rectum likely involvement of posterior uterus, suspicious mesorectal adenopathy but no evidence of distant metastatic disease ? ?Pelvic MRI 09/02/20 - ?CmriT3N2b; crm preserved; +EMVI; 5 cm from internal anal sphincter ? ?She met with [name] with medical oncology. He is planning to initiate TNT. A referral was placed to the genetic counselors ? ?She reports some changes with regards to urgency and frequent bowel movements over the last couple of months. She have occasional rectal bleeding contributed this to some hemorrhoidal issues she had dealt with back when she was pregnant. She has 2 children whom are 76 and 27 years old. ? ?Started cXRT 01/23/21 --> 03/03/21. 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 Scheduled for surgery 05/10/21. From an abdominal standpoint has been doing well. She did develop a port associated clot and some right arm and shoulder discomfort when the clot developed.  ? ?Tolerated prep states she is ready for surgery. Had spine surgery with [name] - cervical laminectomy c/b postop hemorrhage with epidural hematoma - taken back 3/15 for evacuation. She recovered from this well. I messaged [name] and he has cleared her for prophylactic anticoagulation perioperatively for this surgery. ? ? ?PMH: Denies ? ?PSH: Denies ? ?FHx: Denies any known family history of colorectal, breast, endometrial or ovarian cancer. Her father died at age 26 from what was reported to be bilateral pneumonia. ? ?Social Hx: Denies use of tobacco/EtOH/illicit drug.  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 ?Past Medical History:  ?Diagnosis Date  ? Colorectal cancer ([place])   ? Family history of brain cancer   ? Family history of breast cancer   ? Family history of pancreatic cancer   ? ? ?Past Surgical History:  ?Procedure Laterality Date  ? IR IMAGING GUIDED PORT INSERTION  09/09/2020  ? IR REMOVAL TUN ACCESS W/ PORT W/O FL MOD SED  04/04/2021  ? LEEP    ? 2015  ? POSTERIOR CERVICAL LAMINECTOMY N/A 04/19/2021  ? Procedure: CERVICAL LAMINECTOMY FOR EPIDURAL HEMOATOMA;  Surgeon: Earnie Larsson, MD;  Location: [place];  Service: Neurosurgery;  Laterality: N/A;  ? ? ?Family History  ?Problem Relation Age of Onset  ? Hypertension Mother   ? Heart failure Father   ? Hypertension Father   ? Pancreatic cancer Maternal Grandfather   ?     dx 79s  ? Prostate cancer Maternal Grandfather   ? Brain cancer Paternal Grandmother   ?     dx >50  ? Breast cancer Other 82  ?     MGF's sister  ? Endometrial cancer Neg Hx   ? Ovarian cancer Neg Hx   ? ? ?Social:  reports that she has never smoked. She has never used smokeless tobacco. She reports that she does not currently use alcohol. She reports that she does not currently use drugs. ? ?Allergies:  ?Allergies  ?Allergen Reactions  ? Codeine Nausea And Vomiting  ? Dilaudid [Hydromorphone] Nausea And Vomiting  ? Tegaderm Ag Mesh [Silver] Rash  ?  Allergic reaction to Tegaderm  dressing  ? ? ?Medications: I have reviewed the patient's current medications. ? ?Results for orders placed or performed during the hospital encounter of 05/10/21 (from the past 48 hour(s))  ?ABO/Rh     Status: None  ? Collection Time: 05/10/21  7:03 AM  ?Result Value Ref Range  ? ABO/RH(D)    ?  O POS ?Performed at [facility], [place] [address] [place], [place] [address] ?  ?Pregnancy, urine per protocol     Status: None  ? Collection Time: 05/10/21  7:40 AM  ?Result Value Ref Range  ? Preg Test, Ur NEGATIVE NEGATIVE  ?  Comment:        ?THE SENSITIVITY OF  THIS ?METHODOLOGY IS >20 mIU/mL. ?Performed at [facility], [place] [address], [place], [place] [address] ?  ? ? ?No results found. ? ?ROS - all of the below systems have been reviewed with the patient and positives are indicated with bold text ?General: chills, fever or night sweats ?Eyes: blurry vision or double vision ?ENT: epistaxis or sore throat ?Allergy/Immunology: itchy/watery eyes or nasal congestion ?Hematologic/Lymphatic: bleeding problems, blood clots or swollen lymph nodes ?Endocrine: temperature intolerance or unexpected weight changes ?Breast: new or changing breast lumps or nipple discharge ?Resp: cough, shortness of breath, or wheezing ?CV: chest pain or dyspnea on exertion ?GI: as per HPI ?GU: dysuria, trouble voiding, or hematuria ?MSK: joint pain or joint stiffness ?Neuro: TIA or stroke symptoms ?Derm: pruritus and skin lesion changes ?Psych: anxiety and depression ? ?PE ?Blood pressure (!) 131/92, pulse (!) 115, temperature 98.5 ?F (36.9 ?C), resp. rate (!) 169, height _0  (1.676 m), weight 72.6 kg, SpO2 100 %. ?Constitutional: NAD; conversant ?Eyes: Moist conjunctiva; no lid lag; anicteric ?Neck: Trachea midline ?Lungs: Normal respiratory effort ?CV: RRR ?GI: Abd soft, NT/ND; no palpable hepatosplenomegaly ?MSK: Normal range of motion of extremities ?Psychiatric: Appropriate affect; alert and oriented x3 ? ?Results for orders placed or performed during the hospital encounter of 05/10/21 (from the past 48 hour(s))  ?ABO/Rh     Status: None  ? Collection Time: 05/10/21  7:03 AM  ?Result Value Ref Range  ? ABO/RH(D)    ?  O POS ?Performed at [facility], [place] [address], [place], [place] [address] ?  ?Pregnancy, urine per protocol     Status: None  ? Collection Time: 05/10/21  7:40 AM  ?Result Value Ref Range  ? Preg Test, Ur NEGATIVE NEGATIVE  ?  Comment:        ?THE SENSITIVITY OF THIS ?METHODOLOGY IS >20 mIU/mL. ?Performed at [facility],  [place] [address] [place], [place] [address] ?  ? ? ?No results found. ? ? ?A/P: ?Sharon Bennett is an 41 y.o. female with locally advanced rectal cancer ? ?CmriT3N2bM0; 5 cm proximal to IAS on MRI; appears to be anteriorly based and in the mid rectum on imaging ? ?IR placed R IJ PAC ? ?Genetics evaluation completed, no evident pathogenic mutations  ? ?-She had mentioned to [place] that she had a history of abnormal Pap smear and having had a cervical LEEP. She has fibroids, largest is 4 cm. Saw gyn onc with tentative plan for observation ? ?-The anatomy and physiology of the GI tract was reviewed with her again today as well as the pathophysiology of rectal cancer with associated illustrations ?-Completed TNT with consolidation cXRT 03/03/21 ? ?-Will plan to go ahead and repeat staging scans given interval prior to her surgery -  CT Abd/pelvis - can forego chest since she just had CTA chest 03/20/21 that showed no evidence of metastatic disease in lungs  ? ?-We discussed what surgery would involve. We discussed involved segmental resection of the portion of sigmoid colon and rectum down to and just beyond the level of the tumor. We discussed what a colorectal anastomosis at this location would consist of. We discussed that this procedure would involve a diverting loop ileostomy and rationale therein. ?-We gave an overview of the planned procedures, material risks (including, but not limited to, pain, bleeding, infection, scarring, need for blood transfusion, damage to surrounding structures- blood vessels/nerves/viscus/organs, damage to ureter, leak from anastomosis, need for additional procedures, sexual dysfunction, scenarios where a stoma may be necessary and where it could even be permanent, worsening of pre-existing medical conditions, hernia, recurrence of cancer despite all treatment given, pneumonia, heart attack, stroke, death) benefits and alternatives to surgery were discussed at length. The patient's questions  were answered to her satisfaction, she voiced understanding. Additionally, we discussed typical postoperative expectations and the recovery process. ? ?Nadeen Landau, MD FACS ?[address]

## 2021-05-10 NOTE — Op Note (Addendum)
PATIENT: Sharon Bennett  41 y.o. female ? ?Patient Care Team: ?de [place], Blondell Reveal, MD as PCP - General (Family Medicine) ?Lin Givens, RN as Oncology Nurse Navigator ? ?PREOP DIAGNOSIS: RECTAL CANCER ? ?POSTOP DIAGNOSIS: RECTAL CANCER ? ?PROCEDURE:  ?Robotic assisted low anterior resection with double stapled colorectal anastomosis ?Creation of diverting loop ileostomy ?Intraoperative assessment of perfusion using ICG fluorescence imaging ?Flexible sigmoidoscopy ?Bilateral transversus abdominus plane (TAP) blocks ? ?SURGEON: Sharon Mt. Dema Severin, MD ? ?ASSISTANT: Michael Boston, MD ? ?ANESTHESIA: General endotracheal ? ?EBL: 50 mL ?Total I/O ?In: 1100 [I.V.:1000; IV Piggyback:100] ?Out: 325 [Urine:300; Blood:25] ? ?DRAINS: None ? ?SPECIMEN:  ?Rectosigmoid colon - open end proximal, staple end is distal ?Additional rectum - stitch marks distal staple line ?Distal anastomotic ring - 'final distal margin' ? ?COUNTS: Sponge, needle and instrument counts were reported correct x2 ? ?FINDINGS: Palpable mass anteriorly based just palpable at tip of my finger. No evidence of metastatic disease on visceral, parietal peritoneum or liver. Grossly it appeared we were well below the tumor but once we opened specimen on the backtable it appears that we had approximately 1 cm distal margin. Therefore, we opted to take additional rectum to ensure no threatened margin - we took additional ~2 cm of rectum to ensure this. A well perfused, tension free, hemostatic, air tight 31 mm EEA colorectal anastomosis fashioned 5 cm from the anal verge by flexible sigmoidoscopy. ? ? ?NARRATIVE: ?Informed consent was verified. She was taken to the operating room, placed supine on the operating table and SCD's were applied. General endotracheal anesthesia was induced without difficulty. She was then positioned in the lithotomy position with Allen stirrups.  Pressure points were evaluated and padded.  A foley catheter was then placed by nursing  under sterile conditions. Hair on the abdomen was clipped.  She was secured to the operating table.  A digital rectal exam was performed and the mass is palpable at the tip of my finger which is roughly 7 cm or so from the anal verge.  This is anteriorly based.  The abdomen was then prepped and draped in the standard sterile fashion. Surgical timeout was called indicating the correct patient, procedure, positioning and need for preoperative antibiotics. ?  ?An OG tube was placed by anesthesia and confirmed to be to suction.  At Palmer's point, a stab incision was created and the Veress needle was introduced into the peritoneal cavity on the first attempt.  Intraperitoneal location was confirmed by the aspiration and saline drop test.  Pneumoperitoneum was established to a maximum pressure of 15 mmHg using CO2.  Following this, the abdomen was marked for planned trocar sites.  Just to the right and cephalad to the umbilicus, an 8 mm incision was created and an 8 mm blunt tipped robotic trocar was cautiously placed into the peritoneal cavity.  The laparoscope was inserted and demonstrated no evidence of trocar site nor Veress needle site complications.  The Veress needle was removed.  Bilateral transversus abdominis plane blocks were then created using a dilute mixture of Exparel with Marcaine.  3 additional 8 mm robotic trochars were placed under direct visualization roughly in a line extending from the right ASIS towards the left upper quadrant. The bladder was inspected and noted to be at/below the pubic symphysis.  Staying 3 fingerbreadths above the pubic symphysis, an incision was created and the 12 mm robotic trocar inserted directed cephalad into the peritoneal cavity under direct visualization.  An additional 5 mm assist port  was placed in the right lateral abdomen under direct visualization.  The abdomen was surveyed and there was evident tattoo in the pelvis straddling the peritoneal reflection.  Mass just  proximal to this.  Peritoneal surfaces are smooth and glistening without any evident nodularity.  The liver surface is grossly normal in appearance. She was positioned in Trendelenburg with the left side tilted slightly up.  Small bowel was carefully retracted out of the pelvis.  The robot was then docked and I went to the console. ?  ?The sigmoid colon was readily identified.  Attachments of the sigmoid colon were taken down from the intersigmoid fossa.  The rectosigmoid colon was grasped and elevated anteriorly.  Beginning with a medial to lateral approach, the peritoneum overlying the presacral space was carefully incised.  The TME plane was readily gained working in a plane between the fascia propria of the rectum and the presacral fascia.  Hypogastric nerves were seen going along the the presacral fascia and were protected free of injury.  Working more proximally, the mesorectum and sigmoid mesentery were carefully mobilized off of the peritoneum.  The left ureter was identified and protected free of injury.  The left gonadal vessels were identified and protected.  These were both swept "down."  The superior hemorrhoidal and IMA pedicles were identified. Further mesocolon was mobilized proximally staying in this plane between the retroperitoneum proper and the mesocolon. Attention was then turned to the lateral portion of dissection.  The sigmoid colon was then retracted to the right.  The sigmoid colon was fully mobilized. The descending colon was mobilized by incising the Samamtha Tiegs line of Toldt. This was done all the way up to the level of the splenic flexure. ? ?The associated mesocolon was also mobilized medially.  The left ureter again was confirmed to be well away from the vasculature which had been dissected medially.  The rectosigmoid colon was elevated anteriorly. The left ureter was re-identified. The IMA was clear of this. The IMA was then divided with the vessel sealer approximately 1 cm from its  takeoff from the aorta. The stump was inspected and noted to be completely hemostatic with a good seal.  The IMV is also identified and circumferentially dissected at a similar level.  This is then ligated and divided using the vessel sealer.  We elected to take the IMV for both assistance with reaching and additionally, to ensure all associated lymph nodes are removed with the specimen. ? ?Attention was directed back at the rectum.  The mesorectal dissection was then commenced.  This was done sharply.  We stayed within the "TME" plane beginning with the posterior approach first.  The plane between the fascia propria of the rectum and the presacral fascia was identified and dissected.  The tumor is apparent in the deeper pelvis at the level of the mid rectum.  This is again just above the peritoneal reflection.  This is more anteriorly based.  The plane is developed then laterally and finally anteriorly.  The peritoneal reflection is incised.  We worked below the level of the tumor at this level to clear the mesorectum.  Grossly, this is approximately 2 cm below the tumor.  The apex of the vagina is dissected away from the anterior mesorectal plane. ? ?Through the 12 mm port, a robotic 60 mm green load stapler was placed.  The rectum at the level of the cleared mesorectum was then divided using 2 firings of the stapler. The stump was intact and healthy in  appearance. ?  ?Using our IMA pedicle and including this with our specimen, the mesentery was divided all the way out to the level of planned proximal transection.  This preserves the nodal chain to be with the specimen along her IMA and IMV. ? ?Attention was turned to performing a perfusion test. ICG was administered by anesthesia and at the level of the cleared mesentery proximally, there was excellent uptake of the tracer.  The rectum was also well perfused in appearance.  There was a visible pulse in the mesentery out to the level of the cleared colon at the  level of the proximal sigmoid/descending colon junction.  This colon is also supple and healthy in appearance without any thickening.  This reached into the pelvis without any difficulty and remained in that

## 2021-05-11 ENCOUNTER — Other Ambulatory Visit (HOSPITAL_COMMUNITY): Payer: Self-pay

## 2021-05-11 ENCOUNTER — Encounter (HOSPITAL_COMMUNITY): Payer: Self-pay | Admitting: Surgery

## 2021-05-11 LAB — BASIC METABOLIC PANEL
Anion gap: 7 (ref 5–15)
BUN: 5 mg/dL — ABNORMAL LOW (ref 6–20)
CO2: 24 mmol/L (ref 22–32)
Calcium: 8.4 mg/dL — ABNORMAL LOW (ref 8.9–10.3)
Chloride: 107 mmol/L (ref 98–111)
Creatinine, Ser: 0.57 mg/dL (ref 0.44–1.00)
GFR, Estimated: >60 mL/min (ref >60)
Glucose, Bld: 101 mg/dL — ABNORMAL HIGH (ref 70–99)
Potassium: 4.1 mmol/L (ref 3.5–5.1)
Sodium: 138 mmol/L (ref 135–145)

## 2021-05-11 LAB — CBC
HCT: 32 % — ABNORMAL LOW (ref 36.0–46.0)
Hemoglobin: 10.1 g/dL — ABNORMAL LOW (ref 12.0–15.0)
MCH: 28.6 pg (ref 26.0–34.0)
MCHC: 31.6 g/dL (ref 30.0–36.0)
MCV: 90.7 fL (ref 80.0–100.0)
Platelets: 269 K/uL (ref 150–400)
RBC: 3.53 MIL/uL — ABNORMAL LOW (ref 3.87–5.11)
RDW: 14.3 % (ref 11.5–15.5)
WBC: 5.5 K/uL (ref 4.0–10.5)
nRBC: 0 % (ref 0.0–0.2)

## 2021-05-11 MED ORDER — ACETAMINOPHEN 325 MG PO TABS
650.0000 mg | ORAL_TABLET | Freq: Four times a day (QID) | ORAL | Status: DC
  Administered 2021-05-11 – 2021-05-14 (×9): 650 mg via ORAL
  Filled 2021-05-11 (×10): qty 2

## 2021-05-11 MED ORDER — PSYLLIUM 95 % PO PACK
1.0000 | PACK | Freq: Two times a day (BID) | ORAL | 0 refills | Status: DC
  Filled 2021-05-11: qty 60, 30d supply, fill #0

## 2021-05-11 MED ORDER — PSYLLIUM 95 % PO PACK
1.0000 | PACK | Freq: Two times a day (BID) | ORAL | Status: DC
  Administered 2021-05-11 – 2021-05-14 (×7): 1 via ORAL
  Filled 2021-05-11 (×9): qty 1

## 2021-05-11 MED ORDER — HYDROCODONE-ACETAMINOPHEN 5-325 MG PO TABS
1.0000 | ORAL_TABLET | Freq: Four times a day (QID) | ORAL | Status: DC | PRN
  Administered 2021-05-11 – 2021-05-14 (×9): 1 via ORAL
  Filled 2021-05-11 (×11): qty 1

## 2021-05-11 NOTE — TOC Initial Note (Signed)
Transition of Care (TOC) - Initial/Assessment Note  ? ? ?Patient Details  ?Name: Sharon Bennett ?MRN: [id number] ?Date of Birth: 11/21/1980 ? ?Transition of Care (TOC) CM/SW Contact:    ?Kailer Heindel, LCSW ?Phone Number: ?05/11/2021, 3:29 PM ? ?Clinical Narrative:                 ?Met with pt to discuss possible dc needs.  Orders placed for [facility] for new ostomy care.  Pt very pleasant and reports she lives alone but has good support system.  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  Will follow up again with her tomorrow. ? ?Expected Discharge Plan: Home/Self Care (vs. HHRN) ?Barriers to Discharge: Continued Medical Work up ? ? ?Patient Goals and CMS Choice ?Patient states their goals for this hospitalization and ongoing recovery are:: return home ?  ?  ? ?Expected Discharge Plan and Services ?Expected Discharge Plan: Home/Self Care (vs. HHRN) ?In-house Referral: Clinical Social Work ?  ?  ?Living arrangements for the past 2 months: [place] ?                ?  ?  ?  ?  ?  ?  ?  ?  ?  ?  ? ?Prior Living Arrangements/Services ?Living arrangements for the past 2 months: [place] ?Lives with:: Self ?Patient language and need for interpreter reviewed:: Yes ?Do you feel safe going back to the place where you live?: Yes      ?Need for Family Participation in Patient Care: No (Comment) ?Care giver support system in place?: Yes (comment) ?  ?Criminal Activity/Legal Involvement Pertinent to Current Situation/Hospitalization: No - Comment as needed ? ?Activities of Daily Living ?Home Assistive Devices/Equipment: Blood pressure cuff, Eyeglasses ?ADL Screening (condition at time of admission) ?Patient's cognitive ability adequate to safely complete daily activities?: Yes ?Is the patient deaf or have difficulty hearing?: No ?Does the patient have difficulty seeing, even when wearing glasses/contacts?: No ?Does the patient  have difficulty concentrating, remembering, or making decisions?: No ?Patient able to express need for assistance with ADLs?: Yes ?Does the patient have difficulty dressing or bathing?: No ?Independently performs ADLs?: Yes (appropriate for developmental age) ?Does the patient have difficulty walking or climbing stairs?: No ?Weakness of Legs: None ?Weakness of Arms/Hands: None ? ?Permission Sought/Granted ?  ?  ?   ?   ?   ?   ? ?Emotional Assessment ?Appearance:: Appears stated age ?Attitude/Demeanor/Rapport: Gracious ?Affect (typically observed): Accepting ?Orientation: : Oriented to Self, Oriented to Place, Oriented to  Time, Oriented to Situation ?Alcohol / Substance Use: Not Applicable ?Psych Involvement: No (comment) ? ?Admission diagnosis:  Rectal cancer ([place]) [C20] ?Patient Active Problem List  ? Diagnosis Date Noted  ? Chest pain 04/26/2021  ? Epidural hematoma ([place]) 04/19/2021  ? Abnormal cervical Papanicolaou smear 03/21/2021  ? 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 Bacteremia 12/15/2020  ? Elevated transaminase level 12/15/2020  ? Genetic testing 10/06/2020  ? Uterine fibroid 09/22/2020  ? Family history of pancreatic cancer 09/09/2020  ? Family history of brain cancer 09/09/2020  ? Family history of breast cancer 09/09/2020  ? Rectal cancer ([place]) 09/05/2020  ? ?PCP:  de [place], Raymond J, MD ?Pharmacy:   ?[id number] [place] [place] [address] ?[id number]?Phone: [phone]: [phone]? ?[facility] ?[address] ?[place] [address] ?Phone: [phone] Fax: [phone] ? ?[facility] ?[address] ?[place] [address] ?Phone: [phone] Fax: [phone] ? ? ? ? ?Social Determinants of Health (SDOH) Interventions ?  ? ?Readmission Risk Interventions ? ?  05/11/2021  ?  3:12 PM  ?Readmission Risk Prevention Plan  ?Transportation Screening Complete  ?PCP  or Specialist Appt within 5-7 Days Complete  ?Home Care Screening Complete  ? ? ? ?

## 2021-05-11 NOTE — Consult Note (Signed)
[place] Nurse ostomy follow up ?Stoma type/location: RLQ, loop ileostomy  ?Stomal assessment/size: 1 3/4" x 1 1/2 " oblong, pink, moist, functional os at 12 o'clock, distal os at 6 o'clock  ?Peristomal assessment: intact  ?Treatment options for stomal/peristomal skin: 2" ostomy barrier ring ?Output liquid green ?Ostomy pouching: 2pc. 2 3/4" pouching system with 2" skin barrier ring ?Education provided:  ?Extended teaching session with patient, RN friend at the bedside ?Explained role of ostomy nurse and creation of stoma  ?Explained stoma characteristics (budded, flush, color, texture, care) ?Demonstrated pouch change (cutting new skin barrier, measuring stoma, cleaning peristomal skin and stoma, use of barrier ring) ?Education on emptying when 1/3 to 1/2 full and how to empty ?Demonstrated use of wick to clean spout  ?Left educational materials in the patient's room  ?  ?Enrolled patient in [place] Start Discharge program: Yes ? ?[place] Nurse will follow along with you for continued support with ostomy teaching and care ?Ronetta Molla Liane Comber MSN, RN, [place], [place], [place] ?[phone]  ?

## 2021-05-11 NOTE — Progress Notes (Signed)
?  Subjective ?No acute events. Feeling well. No n/v. Tolerating full liquids without issue. Ostomy beginning to work. Pain controlled. ? ?Objective: ?Vital signs in last 24 hours: ?Temp:  [97.9 ?F (36.6 ?C)-98.7 ?F (37.1 ?C)] 98.4 ?F (36.9 ?C) (04/06 1245) ?Pulse Rate:  [84-100] 94 (04/06 1245) ?Resp:  [13-18] 15 (04/06 1245) ?BP: (110-122)/(70-82) 114/77 (04/06 1245) ?SpO2:  [100 %] 100 % (04/06 1245) ?Last BM Date : 05/11/21 ? ?Intake/Output from previous day: ?04/05 0701 - 04/06 0700 ?In: 5209.9 [P.O.:1080; I.V.:4029.9; IV Piggyback:100] ?Out: 5397 [Urine:3200; Blood:25] ?Intake/Output this shift: ?Total I/O ?In: 807.2 [P.O.:360; I.V.:447.2] ?Out: 350 [Urine:350] ? ?Gen: NAD, comfortable ?CV: RRR ?Pulm: Normal work of breathing ?Abd: Soft, minimal tenderness; ileostomy pink and productive. Nondistended ?Ext: SCDs in place ? ?Lab Results: ?CBC  ?Recent Labs  ?  05/11/21 ?0348  ?WBC 5.5  ?HGB 10.1*  ?HCT 32.0*  ?PLT 269  ? ?BMET ?Recent Labs  ?  05/11/21 ?0348  ?NA 138  ?K 4.1  ?CL 107  ?CO2 24  ?GLUCOSE 101*  ?BUN 5*  ?CREATININE 0.57  ?CALCIUM 8.4*  ? ?PT/INR ?No results for input(s): LABPROT, INR in the last 72 hours. ?ABG ?No results for input(s): PHART, HCO3 in the last 72 hours. ? ?Invalid input(s): PCO2, PO2 ? ?Studies/Results: ? ?Anti-infectives: ?Anti-infectives (From admission, onward)  ? ? Start     Dose/Rate Route Frequency Ordered Stop  ? 05/10/21 1400  neomycin (MYCIFRADIN) tablet 1,000 mg  Status:  Discontinued       ?See Hyperspace for full Linked Orders Report.  ? 1,000 mg Oral 3 times per day 05/10/21 0618 05/10/21 0634  ? 05/10/21 1400  metroNIDAZOLE (FLAGYL) tablet 1,000 mg  Status:  Discontinued       ?See Hyperspace for full Linked Orders Report.  ? 1,000 mg Oral 3 times per day 05/10/21 0618 05/10/21 0634  ? 05/10/21 0630  cefoTEtan (CEFOTAN) 2 g in sodium chloride 0.9 % 100 mL IVPB       ? 2 g ?200 mL/hr over 30 Minutes Intravenous On call to O.R. 05/10/21 6734 05/10/21 0906  ? ?   ? ? ? ?Assessment/Plan: ?Patient Active Problem List  ? Diagnosis Date Noted  ? Chest pain 04/26/2021  ? Epidural hematoma ([place]) 04/19/2021  ? Abnormal cervical Papanicolaou smear 03/21/2021  ? 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 Bacteremia 12/15/2020  ? Elevated transaminase level 12/15/2020  ? Genetic testing 10/06/2020  ? Uterine fibroid 09/22/2020  ? Family history of pancreatic cancer 09/09/2020  ? Family history of brain cancer 09/09/2020  ? Family history of breast cancer 09/09/2020  ? Rectal cancer ([place]) 09/05/2020  ? ?s/p Procedure(s): ?XI ROBOTIC ASSISTED LOWER ANTERIOR RESECTION WITH DIVERTING LOOP ILEOSTOMY, INTRAOPERATIVE PERFUSION ASSESSMENT WITH FIREFLY INJECTION ?FLEXIBLE SIGMOIDOSCOPY 05/10/2021 ? ?-Advance to soft as tolerated ?-BID metamucil ?-Monitor ileostomy output for next 24-48 hrs ?-Decrease but continue MIVF ?-Ambulate 5x/day ?-WOCN to see ?-Outpatient referral to [facility] made ?-Home health requested ?-Ppx: SQH, SCDs ? ?We spent time reviewing her procedure, findings, long-term plans. We discussed expectations with an ileostomy. We reviewed risk of dehydration and importance of maintaining hydration. We discussed goals of ostomy output being <1.2L/24hrs.  ? ? LOS: 1 day  ? ?Nadeen Landau, MD FACS ?[facility] ? ?

## 2021-05-11 NOTE — Consult Note (Signed)
? ?  [facility] CM Inpatient Consult ? ? ?05/11/2021 ? ?AZRA ABRELL ?1980-11-10 ?[id number] ? ? [name] Patient: Sharon Bennett ? ?Primary Care Provider:  [place], Blondell Reveal, MD, [facility] was confirmed.  This provider office is not listed with a TOC calls. ? ?Spoke with patient via telephone, patient at [employer]. HIPAA confirmed with 2 identifiers. Spoke with patient regarding post hospital support for follow up.  Patient was gracious and verbalized understanding of support for post hospital follow up.  ? ?[place]: Patient to be assigned to a [facility] [employer] for telephonic care management services.    ?  ?For additional questions or referrals please contact: ?  ?Natividad Brood, RN BSN CCM ?[facility] Liaison ? [phone] business mobile phone ?Toll free office [phone]  ?Fax number: [phone] ?[email or web address] ? ? ?

## 2021-05-12 LAB — BASIC METABOLIC PANEL
Anion gap: 7 (ref 5–15)
BUN: 6 mg/dL (ref 6–20)
CO2: 25 mmol/L (ref 22–32)
Calcium: 8.4 mg/dL — ABNORMAL LOW (ref 8.9–10.3)
Chloride: 108 mmol/L (ref 98–111)
Creatinine, Ser: 0.56 mg/dL (ref 0.44–1.00)
GFR, Estimated: >60 mL/min (ref >60)
Glucose, Bld: 93 mg/dL (ref 70–99)
Potassium: 3.8 mmol/L (ref 3.5–5.1)
Sodium: 140 mmol/L (ref 135–145)

## 2021-05-12 LAB — CBC
HCT: 32.3 % — ABNORMAL LOW (ref 36.0–46.0)
Hemoglobin: 9.9 g/dL — ABNORMAL LOW (ref 12.0–15.0)
MCH: 27.8 pg (ref 26.0–34.0)
MCHC: 30.7 g/dL (ref 30.0–36.0)
MCV: 90.7 fL (ref 80.0–100.0)
Platelets: 217 10*3/uL (ref 150–400)
RBC: 3.56 MIL/uL — ABNORMAL LOW (ref 3.87–5.11)
RDW: 14.2 % (ref 11.5–15.5)
WBC: 5.4 10*3/uL (ref 4.0–10.5)
nRBC: 0 % (ref 0.0–0.2)

## 2021-05-12 MED ORDER — ALUM & MAG HYDROXIDE-SIMETH 200-200-20 MG/5ML PO SUSP
30.0000 mL | Freq: Once | ORAL | Status: AC
  Filled 2021-05-12: qty 30

## 2021-05-12 MED ORDER — HYOSCYAMINE SULFATE 0.125 MG SL SUBL
0.2500 mg | SUBLINGUAL_TABLET | Freq: Once | SUBLINGUAL | Status: AC
  Administered 2021-05-12: 0.25 mg via SUBLINGUAL
  Filled 2021-05-12: qty 2

## 2021-05-12 MED ORDER — ALUM & MAG HYDROXIDE-SIMETH 200-200-20 MG/5ML PO SUSP
30.0000 mL | Freq: Once | ORAL | Status: AC
Start: 2021-05-12 — End: 2021-05-12
  Administered 2021-05-12: 30 mL via ORAL

## 2021-05-12 MED ORDER — DICYCLOMINE HCL 10 MG/5ML PO SOLN
10.0000 mg | Freq: Once | ORAL | Status: AC
  Administered 2021-05-12: 10 mg via ORAL
  Filled 2021-05-12: qty 5

## 2021-05-12 MED ORDER — LIDOCAINE VISCOUS HCL 2 % MT SOLN
15.0000 mL | Freq: Once | OROMUCOSAL | Status: AC
  Administered 2021-05-12: 15 mL via ORAL
  Filled 2021-05-12 (×2): qty 15

## 2021-05-12 NOTE — Progress Notes (Signed)
2 Days Post-Op  ? ?Subjective/Chief Complaint: ?Doing well ? No n/v ?Ambulating ?Pain ok ?About 75cc out of ileostomy, a little bit thicker per pt ? ? ?Objective: ?Vital signs in last 24 hours: ?Temp:  [98.4 ?F (36.9 ?C)-98.9 ?F (37.2 ?C)] 98.5 ?F (36.9 ?C) (04/07 0423) ?Pulse Rate:  [92-97] 92 (04/07 0423) ?Resp:  [15-20] 20 (04/07 0423) ?BP: (114-129)/(77-89) 129/89 (04/07 0423) ?SpO2:  [97 %-100 %] 97 % (04/07 0423) ?Weight:  [71.5 kg] 71.5 kg (04/07 0423) ?Last BM Date : 05/11/21 (from ileostomy) ? ?Intake/Output from previous day: ?04/06 0701 - 04/07 0700 ?In: 2027.2 [P.O.:780; I.V.:1247.2] ?Out: 2225 [Urine:2150; Stool:75] ?Intake/Output this shift: ?No intake/output data recorded. ? ?Alert, nontoxic, nad ?Nonlabored ?Reg ?Soft, mild exptected TTP, some stool in bag ?No edema ? ?Lab Results:  ?Recent Labs  ?  05/11/21 ?7353 05/12/21 ?0410  ?WBC 5.5 5.4  ?HGB 10.1* 9.9*  ?HCT 32.0* 32.3*  ?PLT 269 217  ? ?BMET ?Recent Labs  ?  05/11/21 ?0348 05/12/21 ?0410  ?NA 138 140  ?K 4.1 3.8  ?CL 107 108  ?CO2 24 25  ?GLUCOSE 101* 93  ?BUN 5* 6  ?CREATININE 0.57 0.56  ?CALCIUM 8.4* 8.4*  ? ?PT/INR ?No results for input(s): LABPROT, INR in the last 72 hours. ?ABG ?No results for input(s): PHART, HCO3 in the last 72 hours. ? ?Invalid input(s): PCO2, PO2 ? ?Studies/Results: ?No results found. ? ?Anti-infectives: ?Anti-infectives (From admission, onward)  ? ? Start     Dose/Rate Route Frequency Ordered Stop  ? 05/10/21 1400  neomycin (MYCIFRADIN) tablet 1,000 mg  Status:  Discontinued       ?See Hyperspace for full Linked Orders Report.  ? 1,000 mg Oral 3 times per day 05/10/21 0618 05/10/21 0634  ? 05/10/21 1400  metroNIDAZOLE (FLAGYL) tablet 1,000 mg  Status:  Discontinued       ?See Hyperspace for full Linked Orders Report.  ? 1,000 mg Oral 3 times per day 05/10/21 0618 05/10/21 0634  ? 05/10/21 0630  cefoTEtan (CEFOTAN) 2 g in sodium chloride 0.9 % 100 mL IVPB       ? 2 g ?200 mL/hr over 30 Minutes Intravenous On call to  O.R. 05/10/21 2992 05/10/21 0906  ? ?  ? ? ?Assessment/Plan: ?s/p Procedure(s): ?XI ROBOTIC ASSISTED LOWER ANTERIOR RESECTION WITH DIVERTING LOOP ILEOSTOMY, INTRAOPERATIVE PERFUSION ASSESSMENT WITH FIREFLY INJECTION (N/A) ?FLEXIBLE SIGMOIDOSCOPY (N/A) ? ?Continue soft diet as tolerated ?Twice daily Metamucil ?Continue to monitor ileostomy output-monitor for high output ?Continue ambulation ?WOCN has been seeing ?Continue chemical VTE ?Not ready for discharge today ? ?Leighton Ruff. Redmond Pulling, MD, FACS ?General, Bariatric, & Minimally Invasive Surgery ?[facility] ? ? LOS: 2 days  ? ? ?Sharon Bennett ?05/12/2021 ? ?

## 2021-05-12 NOTE — Progress Notes (Signed)
RN called into patients room because she was having chest pain, EKG performed and MD notified. New orders for GI cocktail placed.  ?

## 2021-05-12 NOTE — Consult Note (Addendum)
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   ?Stoma type/location: RLQ, loop ileostomy  ?Stomal assessment/size: 1 3/4" x 1 1/2 " oblong, slightly above skin level, pink, moist, functional os at 12 o'clock, distal os at 6 o'clock  ?Peristomal assessment: intact  ?Treatment options for stomal/peristomal skin: 2" ostomy barrier ring ?Output: 70cc liquid green stool. ?Ostomy pouching: 2pc. 2 3/4" pouching system with 2" skin barrier ring ?Education provided:  ?Pt was able to apply using a hand held mirror.  She recently had neck surgery and it is difficult to look down; discussed using a large mirror to stand and visualize the location once at home to avoid looking downward.  She applied barrier ring and 2 piece pouching system.  She can open and close velcro to empty. Reviewed pouching routines and ordering supplies.  Discussed dietary precautions and avoiding dehydration. Educational materials in the patient's room, along with 5 sets of barrier rings, Kellie Simmering # [id number], and wafers Kellie Simmering # 2, and pouches, [place] # [phone]. Enrolled patient in [place] Start Discharge program: Yes, previously ?  ?[place] Nurse will follow along with you for continued support with ostomy teaching and care. ?Julien Girt MSN, RN, [place], [place], CNS ?[phone]  ?  ?  ?  ? ? ? ?  ?  ?  ?  ? ?

## 2021-05-13 LAB — BASIC METABOLIC PANEL
Anion gap: 9 (ref 5–15)
BUN: 6 mg/dL (ref 6–20)
CO2: 26 mmol/L (ref 22–32)
Calcium: 8.8 mg/dL — ABNORMAL LOW (ref 8.9–10.3)
Chloride: 103 mmol/L (ref 98–111)
Creatinine, Ser: 0.64 mg/dL (ref 0.44–1.00)
GFR, Estimated: >60 mL/min (ref >60)
Glucose, Bld: 97 mg/dL (ref 70–99)
Potassium: 3.4 mmol/L — ABNORMAL LOW (ref 3.5–5.1)
Sodium: 138 mmol/L (ref 135–145)

## 2021-05-13 LAB — CBC
HCT: 34.3 % — ABNORMAL LOW (ref 36.0–46.0)
Hemoglobin: 11 g/dL — ABNORMAL LOW (ref 12.0–15.0)
MCH: 28.5 pg (ref 26.0–34.0)
MCHC: 32.1 g/dL (ref 30.0–36.0)
MCV: 88.9 fL (ref 80.0–100.0)
Platelets: 245 10*3/uL (ref 150–400)
RBC: 3.86 MIL/uL — ABNORMAL LOW (ref 3.87–5.11)
RDW: 14.1 % (ref 11.5–15.5)
WBC: 3.9 10*3/uL — ABNORMAL LOW (ref 4.0–10.5)
nRBC: 0 % (ref 0.0–0.2)

## 2021-05-13 MED ORDER — POTASSIUM CHLORIDE IN NACL 20-0.9 MEQ/L-% IV SOLN
INTRAVENOUS | Status: DC
Start: 2021-05-13 — End: 2021-05-14
  Filled 2021-05-13 (×4): qty 1000

## 2021-05-13 MED ORDER — LOPERAMIDE HCL 2 MG PO CAPS
2.0000 mg | ORAL_CAPSULE | Freq: Two times a day (BID) | ORAL | Status: DC
  Administered 2021-05-13 – 2021-05-14 (×3): 2 mg via ORAL
  Filled 2021-05-13 (×3): qty 1

## 2021-05-13 NOTE — Progress Notes (Signed)
3 Days Post-Op  ? ?Subjective/Chief Complaint: ?Ostomy output increasing especially this morning ?Had nausea yesterday ? ? ?Objective: ?Vital signs in last 24 hours: ?Temp:  [98.3 ?F (36.8 ?C)-98.8 ?F (37.1 ?C)] 98.3 ?F (36.8 ?C) (04/08 0559) ?Pulse Rate:  [105-127] 105 (04/08 0559) ?Resp:  [16-18] 16 (04/08 0559) ?BP: (125-137)/(90-107) 125/97 (04/08 0559) ?SpO2:  [99 %-100 %] 99 % (04/08 0559) ?Last BM Date : 05/12/21 (from ileostomy) ? ?Intake/Output from previous day: ?04/07 0701 - 04/08 0700 ?In: 3732.4 [P.O.:2520; I.V.:1212.4] ?Out: 2295 [Urine:1600; Stool:695] ?Intake/Output this shift: ?No intake/output data recorded. ? ?Exam: ?Alert ?Abdomen soft, ND, ostomy pink/productive ? ?Lab Results:  ?Recent Labs  ?  05/12/21 ?0410 05/13/21 ?7124  ?WBC 5.4 3.9*  ?HGB 9.9* 11.0*  ?HCT 32.3* 34.3*  ?PLT 217 245  ? ?BMET ?Recent Labs  ?  05/12/21 ?0410 05/13/21 ?5809  ?NA 140 138  ?K 3.8 3.4*  ?CL 108 103  ?CO2 25 26  ?GLUCOSE 93 97  ?BUN 6 6  ?CREATININE 0.56 0.64  ?CALCIUM 8.4* 8.8*  ? ?PT/INR ?No results for input(s): LABPROT, INR in the last 72 hours. ?ABG ?No results for input(s): PHART, HCO3 in the last 72 hours. ? ?Invalid input(s): PCO2, PO2 ? ?Studies/Results: ?No results found. ? ?Anti-infectives: ?Anti-infectives (From admission, onward)  ? ? Start     Dose/Rate Route Frequency Ordered Stop  ? 05/10/21 1400  neomycin (MYCIFRADIN) tablet 1,000 mg  Status:  Discontinued       ?See Hyperspace for full Linked Orders Report.  ? 1,000 mg Oral 3 times per day 05/10/21 0618 05/10/21 0634  ? 05/10/21 1400  metroNIDAZOLE (FLAGYL) tablet 1,000 mg  Status:  Discontinued       ?See Hyperspace for full Linked Orders Report.  ? 1,000 mg Oral 3 times per day 05/10/21 0618 05/10/21 0634  ? 05/10/21 0630  cefoTEtan (CEFOTAN) 2 g in sodium chloride 0.9 % 100 mL IVPB       ? 2 g ?200 mL/hr over 30 Minutes Intravenous On call to O.R. 05/10/21 9833 05/10/21 0906  ? ?  ? ? ?Assessment/Plan: ?s/p Procedure(s): ?XI ROBOTIC ASSISTED  LOWER ANTERIOR RESECTION WITH DIVERTING LOOP ILEOSTOMY, INTRAOPERATIVE PERFUSION ASSESSMENT WITH FIREFLY INJECTION (N/A) ?FLEXIBLE SIGMOIDOSCOPY (N/A) ? ?Increase IVF ?Monitor output ?Imodium prn ?Repeat labs in the morning ? LOS: 3 days  ? ? ?Coralie Keens ?05/13/2021 ? ?

## 2021-05-13 NOTE — TOC Progression Note (Signed)
Transition of Care (TOC) - Progression Note  ? ? ?Patient Details  ?Name: JAMALA KOHEN ?MRN: [id number] ?Date of Birth: 1980/07/18 ? ?Transition of Care (TOC) CM/SW Contact  ?Keltie Labell, LCSW ?Phone Number: ?05/13/2021, 10:30 AM ? ?Clinical Narrative:    ?Met with pt to follow up on [facility] orders.  Pt declines HHRN referral at this time stating that she feels very comfortable managing her colostomy care on her own.  TOC will sign off at this time. ? ? ?Expected Discharge Plan: Home/Self Care (vs. HHRN) ?Barriers to Discharge: Continued Medical Work up ? ?Expected Discharge Plan and Services ?Expected Discharge Plan: Home/Self Care (vs. HHRN) ?In-house Referral: Clinical Social Work ?  ?  ?Living arrangements for the past 2 months: [place] ?                ?  ?  ?  ?  ?  ?  ?  ?  ?  ?  ? ? ?Social Determinants of Health (SDOH) Interventions ?  ? ?Readmission Risk Interventions ? ?  05/11/2021  ?  3:12 PM  ?Readmission Risk Prevention Plan  ?Transportation Screening Complete  ?PCP or Specialist Appt within 5-7 Days Complete  ?Home Care Screening Complete  ? ? ?

## 2021-05-13 NOTE — Plan of Care (Signed)
  Problem: Activity: Goal: Risk for activity intolerance will decrease Outcome: Progressing   Problem: Nutrition: Goal: Adequate nutrition will be maintained Outcome: Progressing   Problem: Pain Managment: Goal: General experience of comfort will improve Outcome: Progressing   Problem: Safety: Goal: Ability to remain free from injury will improve Outcome: Progressing   Problem: Skin Integrity: Goal: Risk for impaired skin integrity will decrease Outcome: Progressing   

## 2021-05-14 LAB — BASIC METABOLIC PANEL
Anion gap: 6 (ref 5–15)
BUN: <5 mg/dL — ABNORMAL LOW (ref 6–20)
CO2: 27 mmol/L (ref 22–32)
Calcium: 8.4 mg/dL — ABNORMAL LOW (ref 8.9–10.3)
Chloride: 107 mmol/L (ref 98–111)
Creatinine, Ser: 0.6 mg/dL (ref 0.44–1.00)
GFR, Estimated: >60 mL/min (ref >60)
Glucose, Bld: 109 mg/dL — ABNORMAL HIGH (ref 70–99)
Potassium: 3.9 mmol/L (ref 3.5–5.1)
Sodium: 140 mmol/L (ref 135–145)

## 2021-05-14 LAB — CBC
HCT: 32.2 % — ABNORMAL LOW (ref 36.0–46.0)
Hemoglobin: 10.2 g/dL — ABNORMAL LOW (ref 12.0–15.0)
MCH: 28.6 pg (ref 26.0–34.0)
MCHC: 31.7 g/dL (ref 30.0–36.0)
MCV: 90.2 fL (ref 80.0–100.0)
Platelets: 232 10*3/uL (ref 150–400)
RBC: 3.57 MIL/uL — ABNORMAL LOW (ref 3.87–5.11)
RDW: 13.8 % (ref 11.5–15.5)
WBC: 3.2 10*3/uL — ABNORMAL LOW (ref 4.0–10.5)
nRBC: 0 % (ref 0.0–0.2)

## 2021-05-14 MED ORDER — PSYLLIUM 95 % PO PACK: 1.0000 | PACK | Freq: Two times a day (BID) | ORAL | 0 refills | Status: AC

## 2021-05-14 MED ORDER — CYCLOBENZAPRINE HCL 10 MG PO TABS: 10.0000 mg | ORAL_TABLET | Freq: Three times a day (TID) | ORAL | 0 refills | Status: DC | PRN

## 2021-05-14 MED ORDER — LOPERAMIDE HCL 2 MG PO CAPS
2.0000 mg | ORAL_CAPSULE | Freq: Two times a day (BID) | ORAL | 0 refills | Status: AC | PRN
Start: 2021-05-14 — End: ?

## 2021-05-14 MED ORDER — HYDROCODONE-ACETAMINOPHEN 5-325 MG PO TABS
1.0000 | ORAL_TABLET | Freq: Four times a day (QID) | ORAL | 0 refills | Status: DC | PRN
Start: 2021-05-14 — End: 2021-07-14

## 2021-05-14 MED ORDER — OXYCODONE HCL 5 MG PO TABS
5.0000 mg | ORAL_TABLET | Freq: Three times a day (TID) | ORAL | 0 refills | Status: DC | PRN
Start: 2021-05-14 — End: 2021-05-14

## 2021-05-14 NOTE — Progress Notes (Signed)
4 Days Post-Op  ? ?Subjective/Chief Complaint: ?Woke up in pain this am ?No n/v ? ? ? ?Objective: ?Vital signs in last 24 hours: ?Temp:  [98.1 ?F (36.7 ?C)-98.5 ?F (36.9 ?C)] 98.1 ?F (36.7 ?C) (04/09 0556) ?Pulse Rate:  [103-107] 103 (04/09 0556) ?Resp:  [16-18] 16 (04/09 0556) ?BP: (122-134)/(81-94) 134/94 (04/09 0556) ?SpO2:  [99 %-100 %] 99 % (04/09 0556) ?Last BM Date : 05/13/21 ? ?Intake/Output from previous day: ?04/08 0701 - 04/09 0700 ?In: 2667.8 [P.O.:1200; I.V.:1467.8] ?Out: 3100 [Urine:2350; Stool:750] ?Intake/Output this shift: ?No intake/output data recorded. ? ?Asleep, easily arousable ?Soft, min distension, liquid stool in bag, mild approp TTP ? ?Lab Results:  ?Recent Labs  ?  05/13/21 ?5625 05/14/21 ?6389  ?WBC 3.9* 3.2*  ?HGB 11.0* 10.2*  ?HCT 34.3* 32.2*  ?PLT 245 232  ? ?BMET ?Recent Labs  ?  05/13/21 ?3734 05/14/21 ?2876  ?NA 138 140  ?K 3.4* 3.9  ?CL 103 107  ?CO2 26 27  ?GLUCOSE 97 109*  ?BUN 6 <5*  ?CREATININE 0.64 0.60  ?CALCIUM 8.8* 8.4*  ? ?PT/INR ?No results for input(s): LABPROT, INR in the last 72 hours. ?ABG ?No results for input(s): PHART, HCO3 in the last 72 hours. ? ?Invalid input(s): PCO2, PO2 ? ?Studies/Results: ?No results found. ? ?Anti-infectives: ?Anti-infectives (From admission, onward)  ? ? Start     Dose/Rate Route Frequency Ordered Stop  ? 05/10/21 1400  neomycin (MYCIFRADIN) tablet 1,000 mg  Status:  Discontinued       ?See Hyperspace for full Linked Orders Report.  ? 1,000 mg Oral 3 times per day 05/10/21 0618 05/10/21 0634  ? 05/10/21 1400  metroNIDAZOLE (FLAGYL) tablet 1,000 mg  Status:  Discontinued       ?See Hyperspace for full Linked Orders Report.  ? 1,000 mg Oral 3 times per day 05/10/21 0618 05/10/21 0634  ? 05/10/21 0630  cefoTEtan (CEFOTAN) 2 g in sodium chloride 0.9 % 100 mL IVPB       ? 2 g ?200 mL/hr over 30 Minutes Intravenous On call to O.R. 05/10/21 8115 05/10/21 0906  ? ?  ? ? ?Assessment/Plan: ?s/p Procedure(s): ?XI ROBOTIC ASSISTED LOWER ANTERIOR  RESECTION WITH DIVERTING LOOP ILEOSTOMY, INTRAOPERATIVE PERFUSION ASSESSMENT WITH FIREFLY INJECTION (N/A) ?FLEXIBLE SIGMOIDOSCOPY (N/A) ? ?Doing well ?Ostomy output improved, 714m/24hrs ?Tolerating diet ?Vitals and labs ok ?Will touchbase with nurse later today and see how progressing; possible dc later today; d/w pt that [facility] ? ?ELeighton Ruff WRedmond Pulling MD, FACS ?General, Bariatric, & Minimally Invasive Surgery ?[facility] ? ? LOS: 4 days  ? ? ?EGreer Pickerel?05/14/2021 ? ?

## 2021-05-15 ENCOUNTER — Other Ambulatory Visit: Payer: Self-pay

## 2021-05-15 DIAGNOSIS — C20 Malignant neoplasm of rectum: Secondary | ICD-10-CM | POA: Diagnosis not present

## 2021-05-15 NOTE — Discharge Summary (Signed)
Patient ID: ?Sharon Bennett ?MRN: [id number] ?DOB/AGE: [birth year] 41 y.o. ? ?Admit date: 05/10/2021 ?Discharge date: 05/14/2021 ? ?Discharge Diagnoses ?Patient Active Problem List  ? Diagnosis Date Noted  ? Chest pain 04/26/2021  ? Epidural hematoma ([place]) 04/19/2021  ? Abnormal cervical Papanicolaou smear 03/21/2021  ? 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 Bacteremia 12/15/2020  ? Elevated transaminase level 12/15/2020  ? Genetic testing 10/06/2020  ? Uterine fibroid 09/22/2020  ? Family history of pancreatic cancer 09/09/2020  ? Family history of brain cancer 09/09/2020  ? Family history of breast cancer 09/09/2020  ? Rectal cancer ([place]) 09/05/2020  ? ? ?Consultants ?none ? ?Procedures ?OR 05/10/21: ?Robotic assisted low anterior resection with double stapled colorectal anastomosis ?Creation of diverting loop ileostomy ?Intraoperative assessment of perfusion using ICG fluorescence imaging ?Flexible sigmoidoscopy ?Bilateral transversus abdominus plane (TAP) blocks ? ?FINDINGS: Palpable mass anteriorly based just palpable at tip of my finger. No evidence of metastatic disease on visceral, parietal peritoneum or liver. Grossly it appeared we were well below the tumor but once we opened specimen on the backtable it appears that we had approximately 1 cm distal margin. Therefore, we opted to take additional rectum to ensure no threatened margin - we took additional ~2 cm of rectum to ensure this. A well perfused, tension free, hemostatic, air tight 31 mm EEA colorectal anastomosis fashioned 5 cm from the anal verge by flexible sigmoidoscopy. ? ?Hospital Course: She was admitted postoperatively and recovered well. She underwent new ileostomy teaching with our [place] team. Her diet gradually advanced. She began to have ileostomy output managed with metamucil and well under 1.2L/day. She had demonstrated self care with ileostomy. We instructed her on things moving forward including things to watch out for, avoid, and to call with  any concerns. She was seen on 4/9 & 4/10 by my partners at which time they had deemed her stable for discharge home. ? ? ? ?Allergies as of 05/14/2021   ? ?   Reactions  ? Codeine Nausea And Vomiting  ? Dilaudid [hydromorphone] Nausea And Vomiting  ? Tegaderm Ag Mesh [silver] Rash  ? Allergic reaction to Tegaderm dressing  ? ?  ? ?  ?Medication List  ?  ? ?STOP taking these medications   ? ?aspirin-acetaminophen-caffeine 250-250-65 MG tablet ?Commonly known as: EXCEDRIN MIGRAINE ?  ? ?  ? ?TAKE these medications   ? ?acetaminophen 500 MG tablet ?Commonly known as: TYLENOL ?Take 1,000 mg by mouth every 6 (six) hours as needed for mild pain or headache. ?  ?cyclobenzaprine 10 MG tablet ?Commonly known as: FLEXERIL ?Take 1 tablet (10 mg total) by mouth 3 (three) times daily as needed for muscle spasms ?  ?HYDROcodone-acetaminophen 5-325 MG tablet ?Commonly known as: Norco ?Take 1 tablet by mouth every 6 (six) hours as needed for moderate pain or severe pain. ?  ?loperamide 2 MG capsule ?Commonly known as: IMODIUM ?Take 1 capsule (2 mg total) by mouth 2 (two) times daily as needed for diarrhea or loose stools. ?  ?melatonin 5 MG Tabs ?Take 5 mg by mouth at bedtime as needed (sleep). ?  ?ondansetron 8 MG tablet ?Commonly known as: ZOFRAN ?Take 1 tablet (8 mg total) by mouth every 8 (eight) hours as needed for nausea or vomiting. ?  ?potassium chloride SA 20 MEQ tablet ?Commonly known as: KLOR-CON M ?Take 1 tablet (20 mEq total) by mouth daily. ?What changed: Another medication with the same name was removed. Continue taking this medication, and follow the directions  you see here. ?  ?psyllium 58.6 % powder ?Commonly known as: METAMUCIL ?Take 1 packet by mouth daily. ?What changed: Another medication with the same name was added. Make sure you understand how and when to take each. ?  ?psyllium 95 % Pack ?Commonly known as: HYDROCIL/METAMUCIL ?Take 1 packet by mouth 2 (two) times daily. ?What changed: You were already taking  a medication with the same name, and this prescription was added. Make sure you understand how and when to take each. ?  ?Slynd 4 MG Tabs ?Generic drug: Drospirenone ?Take 1 tablet by mouth daily. ?  ? ?  ? ? ? ? Follow-up Information   ? ? Ileana Roup, MD. Schedule an appointment as soon as possible for a visit in 2 week(s).   ?Specialties: General Surgery, Colon and Rectal Surgery ?Contact information: ?[address] [place] ?[address] ?[place] [address] ?[phone] ? ? ?  ?  ? ?  ?  ? ?  ? ?Nadeen Landau, MD FACS ?[facility] ?

## 2021-05-15 NOTE — Patient Outreach (Signed)
Received a referral from Natividad Brood, [facility] Liaison for Ms. Knutson. ?I have assigned Valente David, RN to call for transition of care and determine if there are any Case Management needs.  ?  ?Arville Care, CBCS, CMAA ?[place] Management Assistant ?[facility] ?[phone]   ?

## 2021-05-16 ENCOUNTER — Ambulatory Visit (HOSPITAL_COMMUNITY)
Admit: 2021-05-16 | Discharge: 2021-05-16 | Disposition: A | Discharge status: Home / Self Care | Payer: 59 | Attending: Nurse Practitioner | Admitting: Nurse Practitioner

## 2021-05-16 ENCOUNTER — Other Ambulatory Visit: Payer: Self-pay | Admitting: *Deleted

## 2021-05-16 ENCOUNTER — Telehealth: Payer: Self-pay | Admitting: *Deleted

## 2021-05-16 DIAGNOSIS — Z932 Ileostomy status: Secondary | ICD-10-CM | POA: Diagnosis present

## 2021-05-16 DIAGNOSIS — L24B1 Irritant contact dermatitis related to digestive stoma or fistula: Secondary | ICD-10-CM | POA: Diagnosis not present

## 2021-05-16 DIAGNOSIS — Z432 Encounter for attention to ileostomy: Secondary | ICD-10-CM | POA: Insufficient documentation

## 2021-05-16 DIAGNOSIS — L258 Unspecified contact dermatitis due to other agents: Secondary | ICD-10-CM | POA: Insufficient documentation

## 2021-05-16 LAB — SURGICAL PATHOLOGY

## 2021-05-16 NOTE — Discharge Instructions (Signed)
Switched to 2 piece convex coloplast  with belt.  ?Continue powder and skin prep with barrier ring ?May not need barrier ring with convex barrier ?

## 2021-05-16 NOTE — Progress Notes (Signed)
[facility]  ? ?Reason for visit:  ?RLQ ileostomy ?HPI:  ?Rectal cancer with loop ileostomy ?ROS  ?Review of Systems  ?Gastrointestinal:   ?     RLQ ileostomy  ?All other systems reviewed and are negative. ?Vital signs:  ?BP (!) 139/105   Pulse (!) 119   Temp 99.5 ?F (37.5 ?C)   Resp 17   SpO2 100%  ?Exam:  ?Physical Exam ?Vitals reviewed.  ?Constitutional:   ?   Appearance: Normal appearance.  ?Abdominal:  ?   Palpations: Abdomen is soft.  ?Skin: ?   General: Skin is warm and dry.  ?   Findings: Rash present.  ?   Comments: Peristomal skin erythematous  ?Neurological:  ?   Mental Status: She is alert and oriented to person, place, and time.  ?Psychiatric:     ?   Mood and Affect: Mood normal.  ?  ?Stoma type/location:  RLQ ileostomy 1 1/8" slightly budded ?Stomal assessment/size:  1 1/8" pink and moist  slightly budded ?Peristomal assessment:  erythema, intact ?Treatment options for stomal/peristomal skin:  Was wearing 2 piece flat pouch system with some leaking  Will switch to 2 piece convex, does not want 1 piece pouch and needs convexity ?Output: liquid brown stool ?Ostomy pouching: 2pc.  Convex- coloplast.  Add ostomy belt ?Education provided:  We discuss need for convexity due to nearly flush stoma.  Risk of skin breakdown if ileostomy leaks and effluent is on skin.  We discuss risk of dehydration and need to drink plenty of fluids and get adequate electrolytes through gatorade/vitamin water, etc.  Watch for dry mouth, dizziness, dark urine.  She is a Marine scientist and verbalizes understanding. ?We add an ostomy belt for added security as well.   ? ?  ?Impression/dx  ?Ileostomy ?Contact dermatitis ?Education for ileostomy regarding blockage, dehydration ?Discussion  ?See above ?Will set up with coloplast ?Plan  ?See back in 2 weeks.  ? ? ? ?Visit time: 55 minutes.  ? ?Domenic Moras FNP-BC ? ?  ?

## 2021-05-16 NOTE — Patient Outreach (Addendum)
[facility]) Care Management ? ?05/16/2021 ? ?Sharon Bennett ?Jan 23, 1981 ?[id number] ? ? ?Transition of care call  ? ?Referral received: 4/10 ?Initial outreach: 4/11 ?Insurance: [place] Focus/Save/Choice Plan ?  ?Subjective: ?Initial successful telephone call to patient's preferred number in order to complete transition of care assessment; 2 HIPAA identifiers verified. Explained purpose of call and completed transition of care assessment.  ? ?States she is doing well, denies post op problems, states surgical pain well managed with prescribed medications, tolerating  diet , denies bowel or bladder problems.  Mother is currently living with her, assisting with her recovery.  ?  ?Objective:  ?Ms. Bour was hospitalized at [facility] from 4/5-4/9 for colorectal resection and diverting loop ileostomy due to rectal cancer.  She was diagnosed in August 2022, went through both chemo and radiation therapies between 09/15/2020-03/03/2021.  Comorbidities include: cervical laminectomy for epidural hematoma.  She was discharged to home on 4/9 without the need for home health services or DME. ?  ?Assessment:  ?Patient voices good understanding of all discharge instructions.  ?See transition of care flowsheet for assessment details.  She had follow up appointment today with the ostomy clinic, will follow up again on 4/18.  She will also see the surgeon on 4/18, PCP on 4/20, and oncology on 4/28.  Mother will provide transportation to appointments.  ?  ?Plan:  ?Reviewed [facility]'s Active Health Management, agrees to referral and their ongoing follow up.  No ongoing care management needs identified so will close case to [facility] care management services and route successful outreach letter with [facility] Management pamphlet and 24 Hour Nurse Line Magnet to [facility] clinical pool to be mailed to patient's home address.   ? ?Valente David, RN, MSN, CCM ?[facility] Care Management  ?Community Care Manager ?[phone] ? ?

## 2021-05-16 NOTE — Telephone Encounter (Signed)
Sharon Bennett was contacted by telephone to verify understanding of discharge instructions status post their most recent discharge from the hospital on the date:  05/14/21.  Inpatient discharge AVS was re-reviewed with patient, along with cancer center appointments.  Verification of understanding for oncology specific follow-up was validated using the Teach Back method.   ? ?Transportation to appointments were confirmed for the patient as being self/caregiver. ? ?[name]?s questions were addressed to their satisfaction upon completion of this post discharge follow-up call for outpatient oncology.  ?

## 2021-05-22 ENCOUNTER — Encounter: Payer: Self-pay | Admitting: Oncology

## 2021-05-22 ENCOUNTER — Encounter (HOSPITAL_BASED_OUTPATIENT_CLINIC_OR_DEPARTMENT_OTHER): Payer: Self-pay | Admitting: Cardiovascular Disease

## 2021-05-22 ENCOUNTER — Other Ambulatory Visit (HOSPITAL_COMMUNITY): Payer: Self-pay

## 2021-05-22 ENCOUNTER — Ambulatory Visit (HOSPITAL_BASED_OUTPATIENT_CLINIC_OR_DEPARTMENT_OTHER): Payer: 59 | Admitting: Cardiovascular Disease

## 2021-05-22 VITALS — BP 152/92 | Ht 66.0 in | Wt 161.1 lb

## 2021-05-22 DIAGNOSIS — R000 Tachycardia, unspecified: Secondary | ICD-10-CM

## 2021-05-22 DIAGNOSIS — R072 Precordial pain: Secondary | ICD-10-CM | POA: Diagnosis not present

## 2021-05-22 DIAGNOSIS — Z01812 Encounter for preprocedural laboratory examination: Secondary | ICD-10-CM | POA: Diagnosis not present

## 2021-05-22 DIAGNOSIS — R030 Elevated blood-pressure reading, without diagnosis of hypertension: Secondary | ICD-10-CM | POA: Diagnosis not present

## 2021-05-22 DIAGNOSIS — R079 Chest pain, unspecified: Secondary | ICD-10-CM | POA: Diagnosis not present

## 2021-05-22 HISTORY — DX: Elevated blood-pressure reading, without diagnosis of hypertension: R03.0

## 2021-05-22 HISTORY — DX: Tachycardia, unspecified: R00.0

## 2021-05-22 MED ORDER — IVABRADINE HCL 5 MG PO TABS
10.0000 mg | ORAL_TABLET | Freq: Once | ORAL | 0 refills | Status: AC
Start: 2021-05-22 — End: 2021-06-02
  Filled 2021-05-22 – 2021-06-01 (×2): qty 2, 1d supply, fill #0

## 2021-05-22 MED ORDER — METOPROLOL TARTRATE 100 MG PO TABS
100.0000 mg | ORAL_TABLET | ORAL | 0 refills | Status: DC
  Filled 2021-05-22 – 2021-06-01 (×2): qty 1, 1d supply, fill #0

## 2021-05-22 NOTE — Progress Notes (Signed)
?Cardiology Office Note:   ? ?Date:  05/22/2021  ? ?ID:  Sharon Bennett, DOB Dec 27, 1980, MRN [id number] ? ?PCP:  de [place], Raymond J, MD ?  ?[facility] Providers ?Cardiologist:  None    ? ?Referring MD: [place], Blondell Reveal, MD  ? ?No chief complaint on file. ? ? ?History of Present Illness:   ? ?Sharon Bennett is a 41 y.o. female with a hx of colorectal cancer, here for the evaluation of chest pain. She saw her PCP 04/2021 and reported 3-4 weeks of chest pressure while lying in bed. It had been somewhat alleviated with Tums. Given her family history, she was referred to cardiology. Of note, at that visit she was tachycardic to 113 bpm, and her resting heart rate has frequently been in the 110s. She had a chest CT 04/2021 that was negative for PE. There were no coronary calcifications. She underwent robotic assisted resection of a rectal mass with colorectal anastomosis, and a diverting loop ileostomy 05/2021.  ? ?She is accompanied by her mother. Today, she reports her chest discomfort felt like a tightening and an ache in her central chest. When she has indigestion the duration is typically short, but her chest discomfort may last up to 20 minutes. These episodes do not happen often, and she is usually lying in bed. Lately her indigestion has been worse, but she attributes this to stopping protonix due to her prior therapies and surgeries. At home and during her hospitalization her blood pressure has usually been controlled. Today's reading of 156/100 was surprising for her. For the past 3-4 months she has been able to feel intermittent tachycardic heart rates. This may occur while walking upstairs or when just at rest. She is able to tell when she is anxious, and her heart will race while feeling anxious or while feeling fine. Prior to her surgeries she was exercising routinely. Sometimes she does feel a little short of breath with exertion, but she denies any exertional chest discomfort. 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 While working she had coffee daily. Lately she might drink coffee once a week. Typically she avoids soft drinks, may have decaffeinated ginger ale. She has never been a smoker. She denies any peripheral edema. No lightheadedness, headaches, syncope, orthopnea, or PND. ? ?Past Medical History:  ?Diagnosis Date  ? Colorectal cancer ([place])   ? Elevated blood pressure reading 05/22/2021  ? Family history of brain cancer   ? Family history of breast cancer   ? Family history of pancreatic cancer   ? Tachycardia 05/22/2021  ? ? ?Past Surgical History:  ?Procedure Laterality Date  ? FLEXIBLE SIGMOIDOSCOPY N/A 05/10/2021  ? Procedure: FLEXIBLE SIGMOIDOSCOPY;  Surgeon: Ileana Roup, MD;  Location: [facility];  Service: General;  Laterality: N/A;  ? IR IMAGING GUIDED PORT INSERTION  09/09/2020  ? IR REMOVAL TUN ACCESS W/ PORT W/O FL MOD SED  04/04/2021  ? LEEP    ? 2015  ? POSTERIOR CERVICAL LAMINECTOMY N/A 04/19/2021  ? Procedure: CERVICAL LAMINECTOMY FOR EPIDURAL HEMOATOMA;  Surgeon: Earnie Larsson, MD;  Location: [facility];  Service: Neurosurgery;  Laterality: N/A;  ? XI ROBOTIC ASSISTED LOWER ANTERIOR RESECTION N/A 05/10/2021  ? Procedure: XI ROBOTIC ASSISTED LOWER ANTERIOR RESECTION WITH DIVERTING LOOP ILEOSTOMY, INTRAOPERATIVE PERFUSION ASSESSMENT WITH FIREFLY INJECTION;  Surgeon: Ileana Roup, MD;  Location: [facility];  Service: General;  Laterality: N/A;  ? ? ?Current Medications: ?Current Meds  ?Medication Sig  ?  acetaminophen (TYLENOL) 500 MG tablet Take 1,000 mg by mouth every 6 (six) hours as needed for mild pain or headache.  ? cyclobenzaprine (FLEXERIL) 10 MG tablet Take 1 tablet (10 mg total) by mouth 3 (three) times daily as needed for muscle spasms  ? HYDROcodone-acetaminophen (NORCO) 5-325 MG tablet Take 1 tablet by mouth every 6 (six) hours as needed for moderate pain or severe pain.  ? ivabradine (CORLANOR) 5 MG TABS tablet Take 2 tablets (10 mg total) by mouth once  for 1 dose 2 hours prior to test  ? loperamide (IMODIUM) 2 MG capsule Take 1 capsule (2 mg total) by mouth 2 (two) times daily as needed for diarrhea or loose stools.  ? melatonin 5 MG TABS Take 5 mg by mouth at bedtime as needed (sleep).  ? metoprolol tartrate (LOPRESSOR) 100 MG tablet Take 1 tablet (100 mg total) by mouth 2 hour prior to cardiac procedure.  ? ondansetron (ZOFRAN) 8 MG tablet Take 1 tablet (8 mg total) by mouth every 8 (eight) hours as needed for nausea or vomiting.  ? potassium chloride SA (KLOR-CON M) 20 MEQ tablet Take 1 tablet (20 mEq total) by mouth daily.  ? psyllium (HYDROCIL/METAMUCIL) 95 % PACK Take 1 packet by mouth 2 (two) times daily.  ? psyllium (METAMUCIL) 58.6 % powder Take 1 packet by mouth daily.  ? SLYND 4 MG TABS Take 1 tablet by mouth daily.  ?  ? ?Allergies:   Codeine, Dilaudid [hydromorphone], and Tegaderm ag mesh [silver]  ? ?Social History  ? ?Socioeconomic History  ? Marital status: Single  ?  Spouse name: Not on file  ? Number of children: Not on file  ? Years of education: Not on file  ? Highest education level: Not on file  ?Occupational History  ? Not on file  ?Tobacco Use  ? Smoking status: Never  ? Smokeless tobacco: Never  ?Vaping Use  ? Vaping Use: Never used  ?Substance and Sexual Activity  ? Alcohol use: Not Currently  ? Drug use: Not Currently  ? Sexual activity: Not Currently  ?  Birth control/protection: Pill  ?Other Topics Concern  ? Not on file  ?Social History Narrative  ? Not on file  ? ?Social Determinants of Health  ? ?Financial Resource Strain: Low Risk   ? Difficulty of Paying Living Expenses: Not hard at all  ?Food Insecurity: No Food Insecurity  ? Worried About Charity fundraiser in the Last Year: Never true  ? Ran Out of Food in the Last Year: Never true  ?Transportation Needs: No Transportation Needs  ? Lack of Transportation (Medical): No  ? Lack of Transportation (Non-Medical): No  ?Physical Activity: Inactive  ? Days of Exercise per Week: 0 days   ? Minutes of Exercise per Session: 0 min  ?Stress: Not on file  ?Social Connections: Not on file  ?  ? ?Family History: ?The patient's family history includes Brain cancer in her paternal grandmother; Breast cancer (age of onset: 67) in an other family member; Heart attack in her paternal uncle; Heart failure in her paternal uncle; Hypertension in her father and mother; Pancreatic cancer in her maternal grandfather; Prostate cancer in her maternal grandfather; Stroke in her maternal aunt. There is no history of Endometrial cancer or Ovarian cancer. ? ?ROS:   ?Please see the history of present illness.    ?(+) Chest discomfort ?(+) Indigestion ?(+) Palpitations ?(+) Shortness of breath ?All other systems reviewed and are negative. ? ?EKGs/Labs/Other Studies  Reviewed:   ? ?The following studies were reviewed today: ? ?CTA Chest 04/19/2021: ?FINDINGS: ?Cardiovascular: Adequate contrast bolus timing in the pulmonary ?arterial tree. ?  ?No focal filling defect identified in the pulmonary arteries to ?suggest acute pulmonary embolism. ?  ?Cardiac size remains within normal limits. No pericardial effusion. ?Negative visible aorta. ?  ?Mediastinum/Nodes: Negative. No mediastinal mass or lymphadenopathy. ?  ?Lungs/Pleura: Stable and essentially clear. Minimal dependent ?atelectasis in the lower lobes. No pleural effusion. ?  ?Upper Abdomen: Stable and negative visible upper abdominal viscera. ?  ?Musculoskeletal: Trace gas within the visible posterior cervical ?spinal canal on series 10, image 74 is likely postoperative in this ?setting. Visible cervical vertebrae appear intact. No cervical spine ?hardware is visible. No acute or suspicious osseous lesion ?identified. Recently removed right chest Port-A-Cath. ?  ?Review of the MIP images confirms the above findings. ?  ?IMPRESSION: ?1. No evidence of acute pulmonary embolism. No acute finding in the ?chest. ?2. Small volume gas within the dorsal lower cervical spinal  canal, ?likely postoperative in this setting. ? ?CT Chest 04/07/2021: ?FINDINGS: ?CT CHEST FINDINGS ?  ?Cardiovascular: Small hematoma of the anterior right chest at the ?site of recent port catheter removal (se

## 2021-05-22 NOTE — Assessment & Plan Note (Addendum)
BP elevated today both initially and on repeat. This has not been an issue for her in the past.  She is going to track her BP at home for the next month and bring to follow up.  Given her tachycardia, may consider adding a beta blocker.  ?

## 2021-05-22 NOTE — Patient Instructions (Addendum)
Medication Instructions:  ?Your Physician recommend you continue on your current medication as directed.   ? ?*If you need a refill on your cardiac medications before your next appointment, please call your pharmacy* ? ? ?Lab Work: ?Your provider has recommended lab work today (BMP). Please have this collected at [facility] [place]. The lab is open 8:00 am - 4:30 pm. Please avoid 12:00p - 1:00p for lunch hour. You do not need an appointment. Please go to [address] [place], [place] [address]. This is in the Primary Care office on the 3rd floor, let them know you are there for blood work and they will direct you to the lab. ? ?If you have labs (blood work) drawn today and your tests are completely normal, you will receive your results only by: ?MyChart Message (if you have MyChart) OR ?A paper copy in the mail ?If you have any lab test that is abnormal or we need to change your treatment, we will call you to review the results. ? ? ?Testing/Procedures: ?Your physician has requested that you have an echocardiogram. Echocardiography is a painless test that uses sound waves to create images of your heart. It provides your doctor with information about the size and shape of your heart and how well your heart?s chambers and valves are working. This procedure takes approximately one hour. There are no restrictions for this procedure. ?[place] ? ?Cardiac CT Angiography (CTA), is a special type of CT scan that uses a computer to produce multi-dimensional views of major blood vessels throughout the body. In CT angiography, a contrast material is injected through an IV to help visualize the blood vessels ?[facility] ? ? ?Follow-Up: ?At [facility], you and your health needs are our priority.  As part of our continuing mission to provide you with exceptional heart care, we have created designated Provider Care Teams.  These Care Teams include your primary  Cardiologist (physician) and Advanced Practice Providers (APPs -  Physician Assistants and Nurse Practitioners) who all work together to provide you with the care you need, when you need it. ? ?We recommend signing up for the patient portal called "MyChart".  Sign up information is provided on this After Visit Summary.  MyChart is used to connect with patients for Virtual Visits (Telemedicine).  Patients are able to view lab/test results, encounter notes, upcoming appointments, etc.  Non-urgent messages can be sent to your provider as well.   ?To learn more about what you can do with MyChart, go to [email or web address].   ? ?Your next appointment:   ?1 month(s) ? ?The format for your next appointment:   ?In Person ? ?Provider:   ?Laurann Montana, NP ? ?Your physician recommends that you schedule a follow-up appointment in 6 months with Dr. Oval Linsey ? ?  ? ? ?Your cardiac CT will be scheduled at one of the below locations:  ? ?[facility] ?[address] ?[place], [place] [address] ?[phone] ? ? ?If scheduled at [facility], please arrive at the [facility] Entrance (Entrance C2) of [facility] 30 minutes prior to test start time. ?You can use the FREE valet parking offered at entrance C (encouraged to control the heart rate for the test)  ?Proceed to the [facility] Radiology Department (first floor) to check-in and test prep. ? ?All radiology patients and guests should use entrance C2 at [facility], accessed from [facility], even though the  hospital's physical address listed is [address] ? ? ? ?If scheduled at [facility], please arrive 15 mins early for check-in and test prep. ? ?Please follow these instructions carefully (unless otherwise directed): ? ? ?On the Night Before the Test: ?Be sure to Drink plenty of water. ?Do not consume any caffeinated/decaffeinated beverages or chocolate 12 hours prior to  your test. ?Do not take any antihistamines 12 hours prior to your test. ? ? ?On the Day of the Test: ?Drink plenty of water until 1 hour prior to the test. ?Do not eat any food 4 hours prior to the test. ?You may take your regular medications prior to the test.  ?Take metoprolol (Lopressor) 100 mg and Ivabradine 10 mg two hours prior to test. ?FEMALES- please wear underwire-free bra if available, avoid dresses & tight clothin ? ?     ?After the Test: ?Drink plenty of water. ?After receiving IV contrast, you may experience a mild flushed feeling. This is normal. ?On occasion, you may experience a mild rash up to 24 hours after the test. This is not dangerous. If this occurs, you can take Benadryl 25 mg and increase your fluid intake. ?If you experience trouble breathing, this can be serious. If it is severe call 911 IMMEDIATELY. If it is mild, please call our office. ?If you take any of these medications: Glipizide/Metformin, Avandament, Glucavance, please do not take 48 hours after completing test unless otherwise instructed. ? ?We will call to schedule your test 2-4 weeks out understanding that some insurance companies will need an authorization prior to the service being performed.  ? ?For non-scheduling related questions, please contact the cardiac imaging nurse navigator should you have any questions/concerns: ?Marchia Bond, Cardiac Imaging Nurse Navigator ?Gordy Clement, Cardiac Imaging Nurse Navigator ?[facility] ?Direct Office Dial: [phone]  ? ?For scheduling needs, including cancellations and rescheduling, please call [place], [phone]. ? ? ? ?Important Information About Sugar ? ? ? ? ? ? ?

## 2021-05-22 NOTE — Assessment & Plan Note (Signed)
She has been tachycardic at rest for several months.  She is mildly anemic, but not enough to explain her tachycardia.  There have been times when she is felt anxious, but her heart rates are elevated even when she is feeling well rested.  Thyroid function was tested with her OB/GYN and was reportedly normal in the last few months.  We will get an echocardiogram to make sure there is no evidence of any tachycardia mediated cardiomyopathy.  She will check her blood pressures at home.  If elevated we may need to start a beta-blocker. ?

## 2021-05-22 NOTE — Assessment & Plan Note (Addendum)
Her chest pain symptoms seem most likely due to GERD.  However she also has exertional dyspnea.  Additionally, she had some inferior and anterior T wave inversions when more tachycardic.  I reviewed her chest CT and there was no coronary calcification.  We will get a coronary CTA to make sure there is no soft plaque or evidence of obstructive coronary disease.  She is quite tachycardic.  We will give her metoprolol 100 mg and ivabradine 10 mg prior to the study. ?

## 2021-05-23 ENCOUNTER — Encounter: Payer: Self-pay | Admitting: Oncology

## 2021-05-23 ENCOUNTER — Encounter (HOSPITAL_COMMUNITY)
Admission: RE | Admit: 2021-05-23 | Discharge: 2021-05-23 | Disposition: A | Discharge status: Home / Self Care | Payer: 59 | Source: Ambulatory Visit | Attending: Nurse Practitioner | Admitting: Nurse Practitioner

## 2021-05-23 DIAGNOSIS — Z932 Ileostomy status: Secondary | ICD-10-CM | POA: Diagnosis not present

## 2021-05-23 DIAGNOSIS — Z432 Encounter for attention to ileostomy: Secondary | ICD-10-CM

## 2021-05-23 DIAGNOSIS — C20 Malignant neoplasm of rectum: Secondary | ICD-10-CM | POA: Diagnosis not present

## 2021-05-23 LAB — BASIC METABOLIC PANEL
BUN/Creatinine Ratio: 15 (ref 9–23)
BUN: 11 mg/dL (ref 6–24)
CO2: 20 mmol/L (ref 20–29)
Calcium: 9.9 mg/dL (ref 8.7–10.2)
Chloride: 98 mmol/L (ref 96–106)
Creatinine, Ser: 0.72 mg/dL (ref 0.57–1.00)
Glucose: 94 mg/dL (ref 70–99)
Potassium: 4.5 mmol/L (ref 3.5–5.2)
Sodium: 138 mmol/L (ref 134–144)
eGFR: 107 mL/min/{1.73_m2} (ref >59)

## 2021-05-23 NOTE — Progress Notes (Signed)
[facility]  ? ?Reason for visit:  ?RLQ ileostomy  states she felt as though she could smell the coloplast pouch more and wants to try hollister again.  No filter. Wants to add ostomy belt to hollister (we had added to coloplast)  ?HPI:  ?Rectal cancer with RLQ ileostomy ?ROS  ?Review of Systems  ?Gastrointestinal:   ?     RLQ Ileostomy  ?Skin: Negative.   ?All other systems reviewed and are negative. ?Vital signs:  ?BP (!) 121/101 (BP Location: Right Arm)   Pulse (!) 111   Temp 98.4 ?F (36.9 ?C) (Oral)   Resp 18   SpO2 100%  ?Exam:  ?Physical Exam ?Vitals reviewed.  ?Constitutional:   ?   Appearance: Normal appearance.  ?Abdominal:  ?   Palpations: Abdomen is soft.  ?   Comments: Creasing at 3 and 9 o'clock  ?Skin: ?   General: Skin is warm and dry.  ?Neurological:  ?   Mental Status: She is alert.  ?Psychiatric:     ?   Mood and Affect: Mood normal.     ?   Behavior: Behavior normal.     ?   Thought Content: Thought content normal.  ?  ?Stoma type/location:  RLQ ileostomy ?Stomal assessment/size:  1" well budded ?Peristomal assessment:  intact  creasing at 3 and 9 o'clock.  Belt helps hold pouch securely in place ?Treatment options for stomal/peristomal skin: belt/stoma powder and skin prep barrier ring and 2 piece pouch.  I provide her with samples of the above and ask her to let me know which she prefers and I will get her set up with [facility] ?Output: liquid brown stool ?Ostomy pouching: 2pc. 2 3/4" pouch with barrier ring   ?Education provided:  switch back to [place] and try this system hoping for increased odor control.  ? ?  ?Impression/dx  ?ileostomy ?Discussion  ?Call clinic for update on pouch preference ?Plan  ?Will set up for auto ship on supplies ? ? ? ?Visit time: 40 minutes.  ? ?Domenic Moras FNP-BC ? ?  ?

## 2021-05-23 NOTE — Discharge Instructions (Signed)
Let me know which pouch you prefer ?I will enroll with [facility] ?

## 2021-05-24 ENCOUNTER — Other Ambulatory Visit: Payer: Self-pay

## 2021-05-24 NOTE — Progress Notes (Signed)
The proposed treatment discussed in conference is for discussion purpose only and is not a binding recommendation.  The patients have not been physically examined, or presented with their treatment options.  Therefore, final treatment plans cannot be decided.  

## 2021-05-25 ENCOUNTER — Encounter: Payer: Self-pay | Admitting: Oncology

## 2021-05-25 ENCOUNTER — Ambulatory Visit (HOSPITAL_BASED_OUTPATIENT_CLINIC_OR_DEPARTMENT_OTHER): Payer: 59 | Admitting: Cardiovascular Disease

## 2021-05-26 ENCOUNTER — Other Ambulatory Visit: Payer: Self-pay | Admitting: Surgery

## 2021-05-26 ENCOUNTER — Ambulatory Visit (INDEPENDENT_AMBULATORY_CARE_PROVIDER_SITE_OTHER): Payer: 59

## 2021-05-26 DIAGNOSIS — R079 Chest pain, unspecified: Secondary | ICD-10-CM

## 2021-05-26 DIAGNOSIS — R000 Tachycardia, unspecified: Secondary | ICD-10-CM

## 2021-05-26 DIAGNOSIS — C20 Malignant neoplasm of rectum: Secondary | ICD-10-CM

## 2021-05-26 LAB — ECHOCARDIOGRAM COMPLETE
AR max vel: 1.98 cm2
AV Area VTI: 1.77 cm2
AV Area mean vel: 1.85 cm2
AV Mean grad: 4 mmHg
AV Peak grad: 6.2 mmHg
Ao pk vel: 1.24 m/s
Area-P 1/2: 2.79 cm2
Calc EF: 58.3 %
S' Lateral: 2.33 cm
Single Plane A2C EF: 62.3 %
Single Plane A4C EF: 54.4 %

## 2021-05-29 ENCOUNTER — Encounter: Payer: Self-pay | Admitting: Oncology

## 2021-05-29 ENCOUNTER — Other Ambulatory Visit (HOSPITAL_COMMUNITY): Payer: Self-pay

## 2021-05-29 MED ORDER — SLYND 4 MG PO TABS
1.0000 | ORAL_TABLET | Freq: Every day | ORAL | 2 refills | Status: DC
Start: 2021-05-29 — End: 2022-04-27
  Filled 2021-05-29: qty 84, 84d supply, fill #0
  Filled 2021-06-01: qty 84, 72d supply, fill #0

## 2021-05-30 ENCOUNTER — Ambulatory Visit
Admission: RE | Admit: 2021-05-30 | Discharge: 2021-05-30 | Disposition: A | Discharge status: Home / Self Care | Payer: 59 | Source: Ambulatory Visit | Attending: Surgery | Admitting: Surgery

## 2021-05-30 ENCOUNTER — Other Ambulatory Visit (HOSPITAL_COMMUNITY): Payer: Self-pay

## 2021-05-30 DIAGNOSIS — C20 Malignant neoplasm of rectum: Secondary | ICD-10-CM | POA: Diagnosis not present

## 2021-05-30 IMAGING — RF DG BE SINGLE CONTRAST
1 series · 15 of 24 positions shown · IV contrast (omnipaque)
Comparison: CT [DATE]

CLINICAL DATA: Rectal cancer (HCC).  THROUGH RECTUM ONLY.

EXAM:
SINGLE CONTRAST BARIUM ENEMA
TECHNIQUE: Initial scout AP supine abdominal image obtained to insure adequate
colon cleansing. Water-soluble contrast (Omnipaque) was introduced
into the colon in a retrograde fashion and refluxed from the rectum
to the cecum. Spot images of the colon followed by overhead
radiographs were obtained.
FLUOROSCOPY:
Radiation Exposure Index (as provided by the fluoroscopic device):
37.0 mGy Kerma

[Series 1: one shot · 0.14mm/px · 15 of 25 slices shown]
[im 1/25]
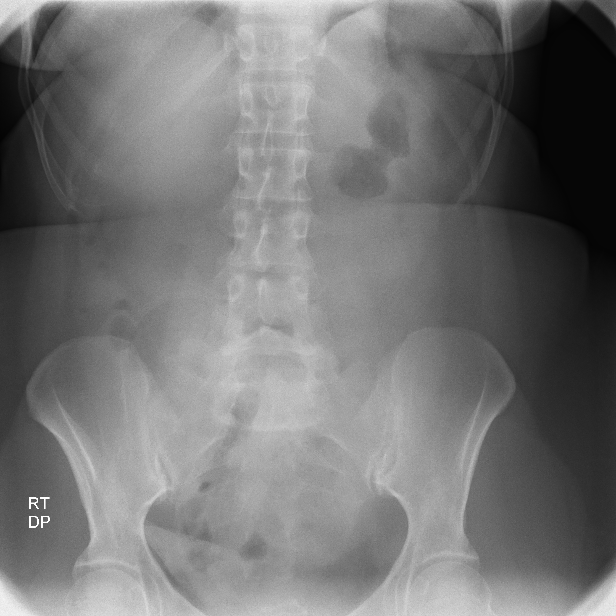
[im 3/25]
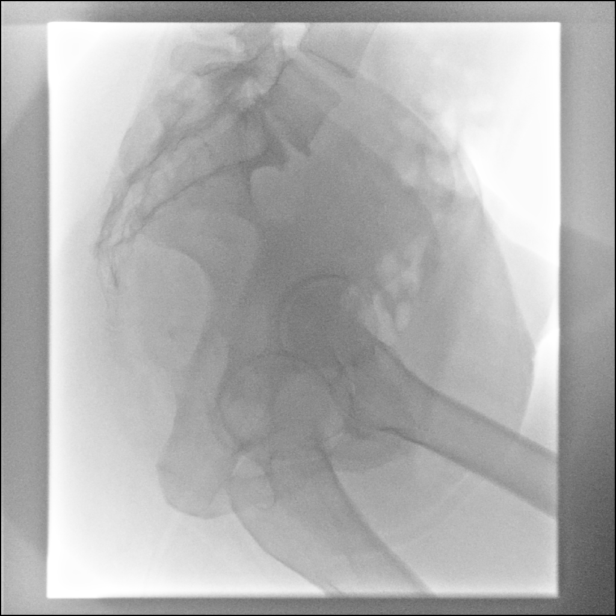
[im 5/25]
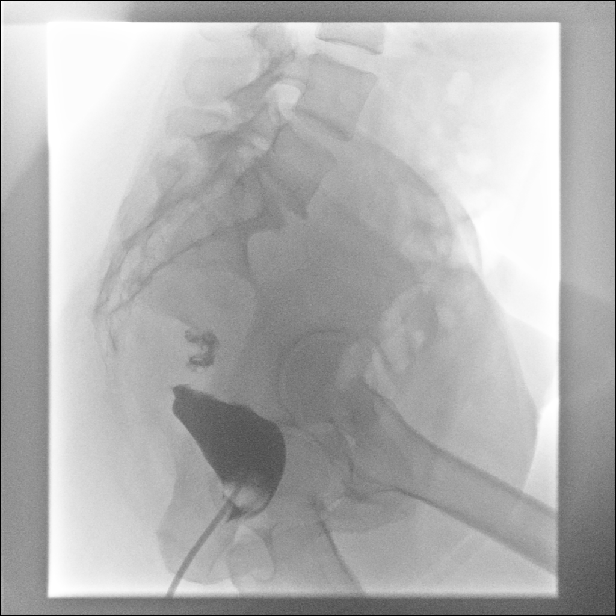
[im 6/25]
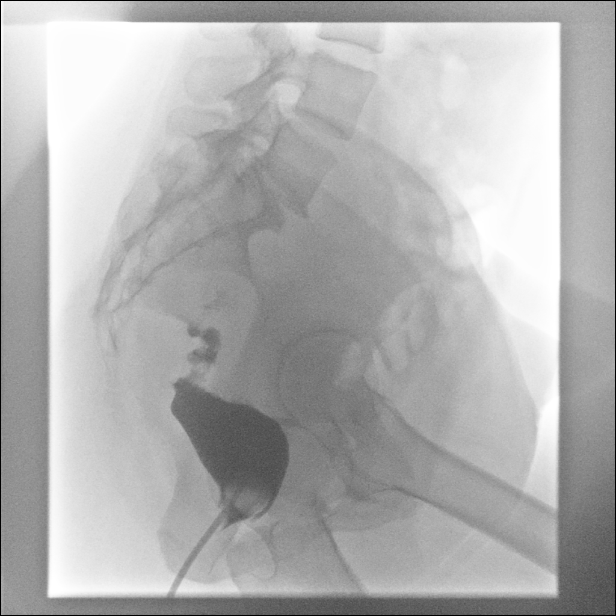
[im 8/25]
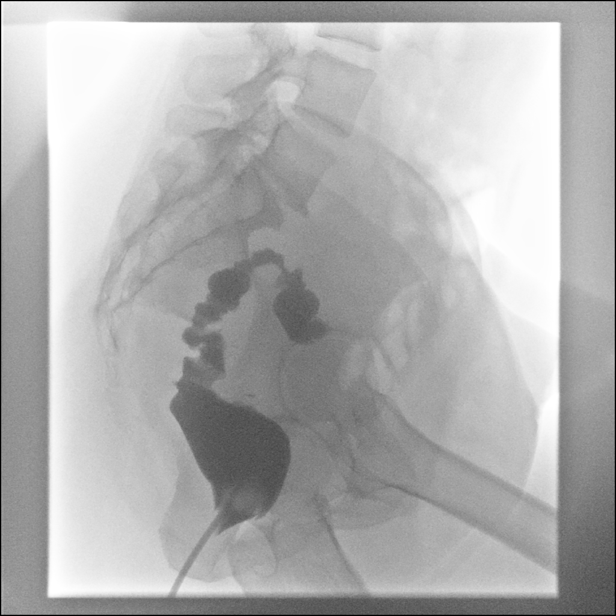
[im 9/25]
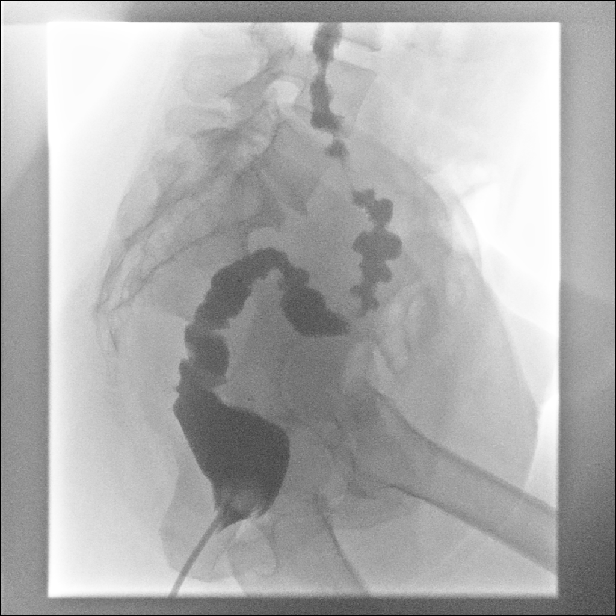
[im 11/25]
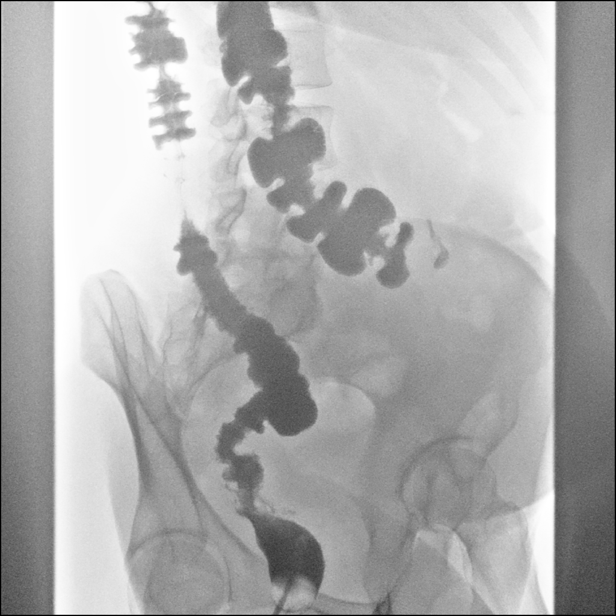
[im 13/25]
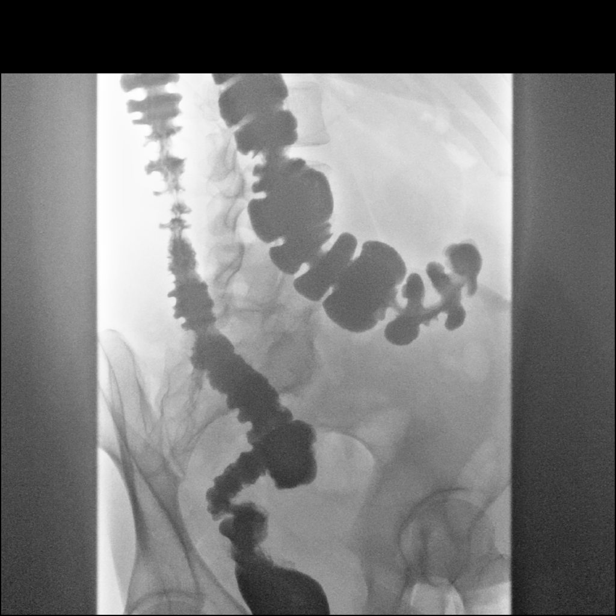
[im 14/25]
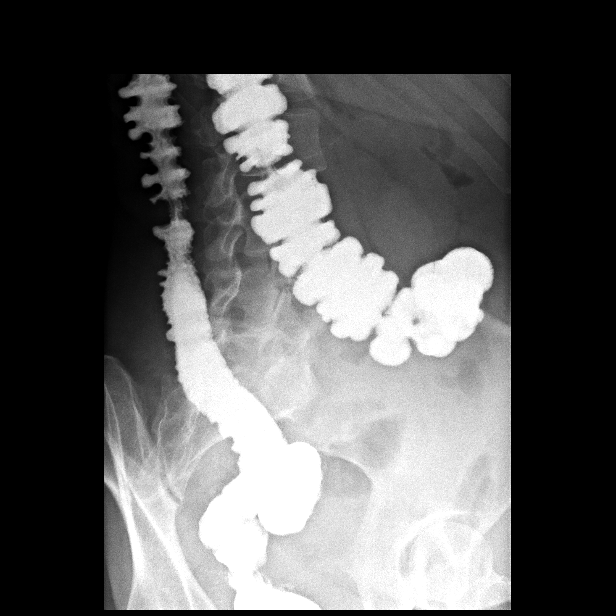
[im 16/25]
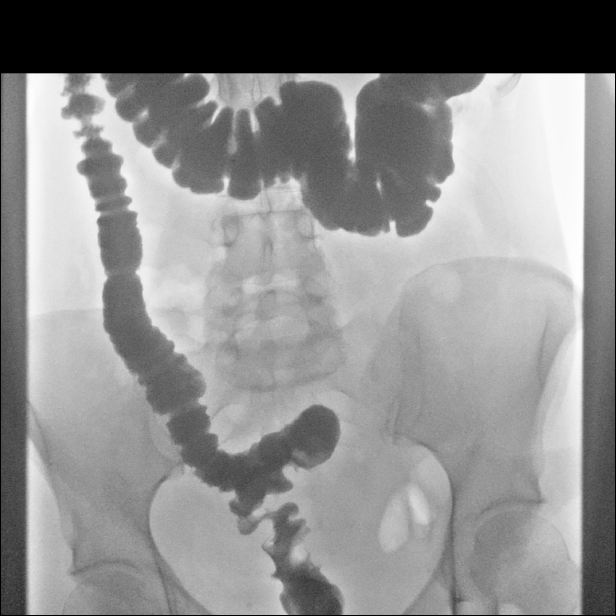
[im 17/25]
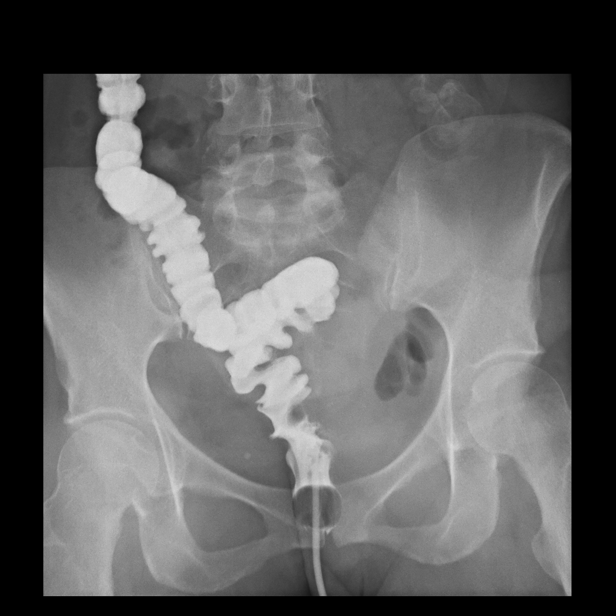
[im 19/25]
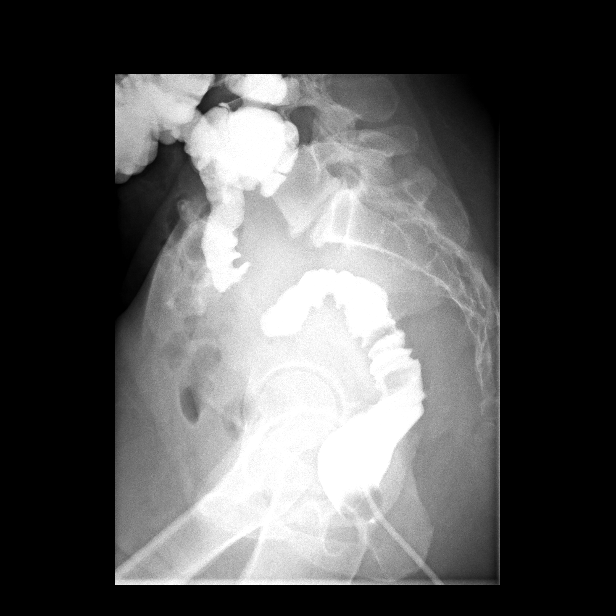
[im 21/25]
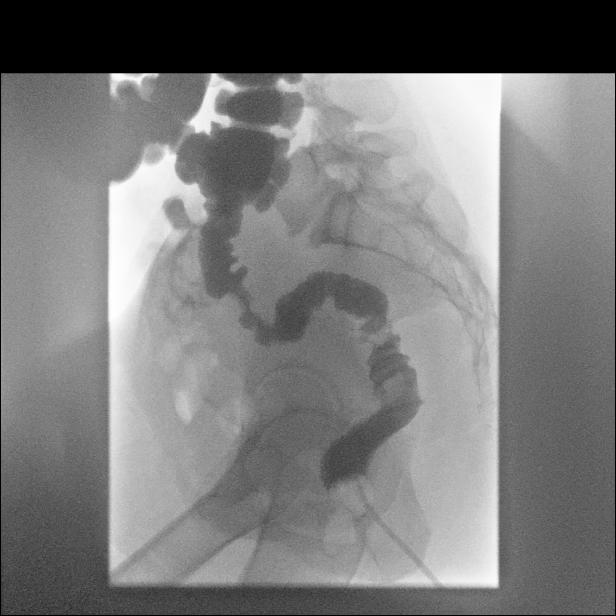
[im 22/25]
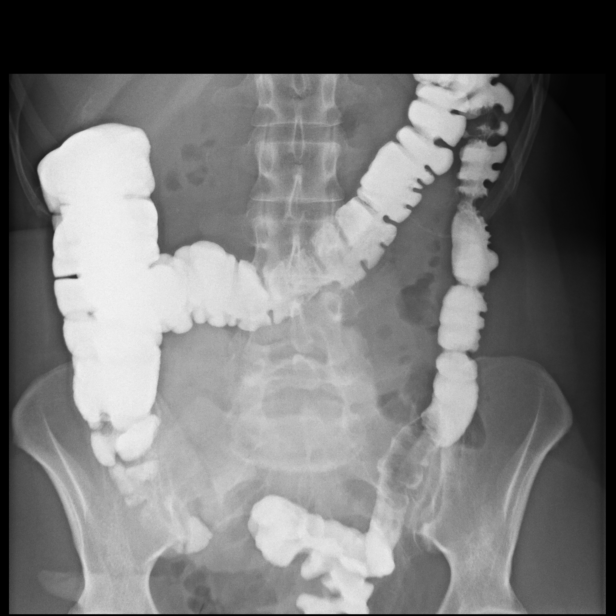
[im 25/25]
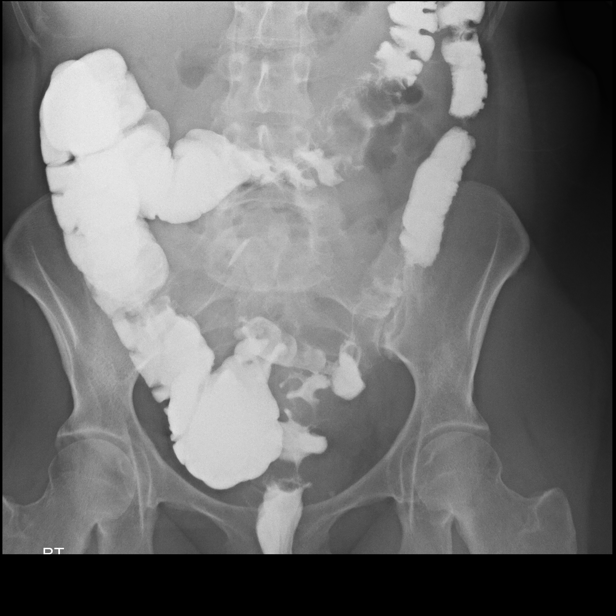

[15 of 24 positions shown; findings below may reference images not displayed]

FINDINGS: Scout radiograph demonstrates no evidence of bowel obstruction.
Postsurgical changes in the region of the rectum. Water-soluble
contrast fills the rectosigmoid colon without evidence of contrast
leak. Patent colorectal anastomosis. Contrast continues to fill the
descending, transverse, and ascending colon to the cecum without
abnormality identified. Multiple images were obtained in the
lateral, prone, prone oblique, and frontal projections. There is no
evidence of a contrast leak. There is a large amount of residual
water-soluble contrast after attempted evacuation.
IMPRESSION: Patent colorectal anastomosis without evidence of contrast leak.

## 2021-06-01 ENCOUNTER — Encounter: Payer: Self-pay | Admitting: Oncology

## 2021-06-01 ENCOUNTER — Telehealth (HOSPITAL_COMMUNITY): Payer: Self-pay | Admitting: *Deleted

## 2021-06-01 ENCOUNTER — Other Ambulatory Visit (HOSPITAL_COMMUNITY): Payer: Self-pay

## 2021-06-01 NOTE — Telephone Encounter (Signed)
Reaching out to patient to offer assistance regarding upcoming cardiac imaging study; pt verbalizes understanding of appt date/time, parking situation and where to check in, pre-test NPO status and medications ordered, and verified current allergies; name and call back number provided for further questions should they arise ? ?Gordy Clement RN Navigator Cardiac Imaging ?[facility] ?[phone] office ?[phone] cell ? ?Patient to take '100mg'$  metoprolol tartrate + '10mg'$  ivabradine two hours prior to his cardiac CT scan.  She is aware to arrive at 4pm. ?

## 2021-06-02 ENCOUNTER — Other Ambulatory Visit (HOSPITAL_COMMUNITY): Payer: Self-pay

## 2021-06-02 ENCOUNTER — Inpatient Hospital Stay: Payer: 59 | Attending: Oncology | Admitting: Oncology

## 2021-06-02 ENCOUNTER — Ambulatory Visit (HOSPITAL_COMMUNITY)
Admission: RE | Admit: 2021-06-02 | Discharge: 2021-06-02 | Disposition: A | Discharge status: Home / Self Care | Payer: 59 | Source: Ambulatory Visit | Attending: Cardiovascular Disease | Admitting: Cardiovascular Disease

## 2021-06-02 ENCOUNTER — Other Ambulatory Visit: Payer: Self-pay

## 2021-06-02 VITALS — BP 137/99 | HR 100 | Temp 98.1°F | Resp 18 | Ht 66.0 in | Wt 161.0 lb

## 2021-06-02 DIAGNOSIS — C20 Malignant neoplasm of rectum: Secondary | ICD-10-CM | POA: Diagnosis not present

## 2021-06-02 DIAGNOSIS — R200 Anesthesia of skin: Secondary | ICD-10-CM | POA: Insufficient documentation

## 2021-06-02 DIAGNOSIS — Z79899 Other long term (current) drug therapy: Secondary | ICD-10-CM | POA: Insufficient documentation

## 2021-06-02 DIAGNOSIS — R072 Precordial pain: Secondary | ICD-10-CM | POA: Insufficient documentation

## 2021-06-02 DIAGNOSIS — M79652 Pain in left thigh: Secondary | ICD-10-CM | POA: Diagnosis not present

## 2021-06-02 IMAGING — CT CT HEART MORP W/ CTA COR W/ SCORE W/ CA W/CM &/OR W/O CM
4 of 7 series · 8 of 20 positions shown, 9 images · IV contrast (omnipaque)
Comparison: [DATE]

Addendum:
HISTORY: Chest pain, nonspecific

EXAM:
Cardiac/Coronary  CT
TECHNIQUE: The patient was scanned on a Siemens Force scanner.
PROTOCOL: A 120 kV prospective scan was triggered in the descending thoracic
aorta at 111 HU's. Axial non-contrast 3 mm slices were carried out
through the heart. The data set was analyzed on a dedicated work
station and scored using the Agatston method. Gantry rotation speed
was 250 msecs and collimation was .6 mm. Beta blockade and 0.8 mg of
sl NTG was given. The 3D data set was reconstructed in 5% intervals
of the 35-75 % of the R-R cycle. Systolic and diastolic phases were
analyzed on a dedicated work station using MPR, MIP and VRT modes.
The patient received 100mL OMNIPAQUE IOHEXOL 350 MG/ML SOLN
contrast.

[Series 6: best diast 72 % · axial · 0.39mm/px · z∈[+1342,+1382]mm · 2 of 301 slices shown]
[im 101/301  vessel]
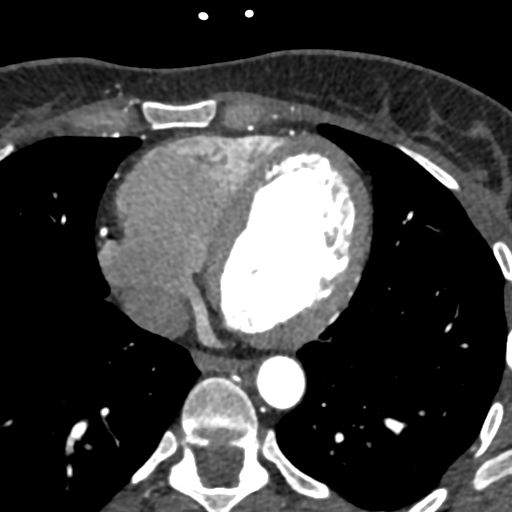
[im 201/301  vessel]
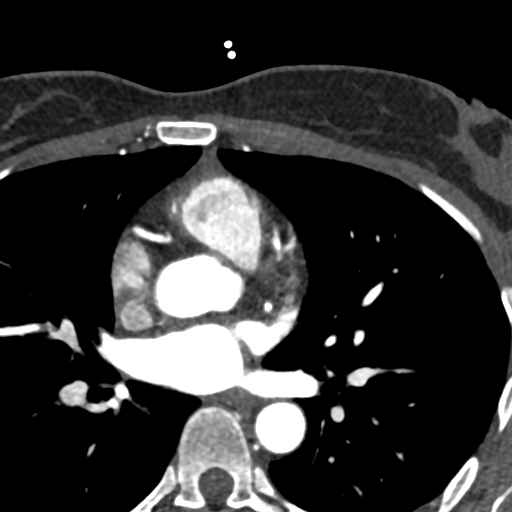

[Series 7: best syst · axial · 0.39mm/px · z∈[+1342,+1382]mm · 2 of 301 slices shown, 3 images]
[im 101/301  vessel]
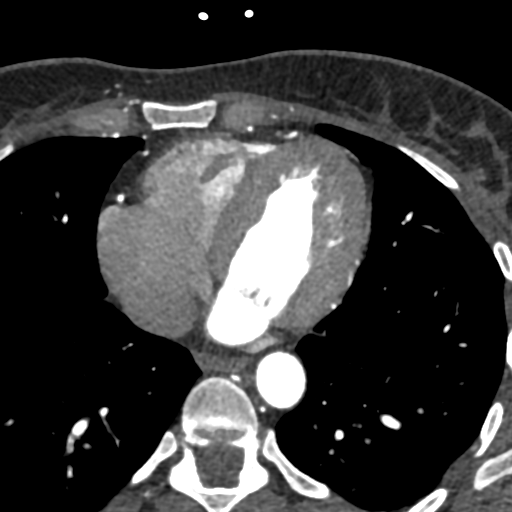
[im 101/301  lung]
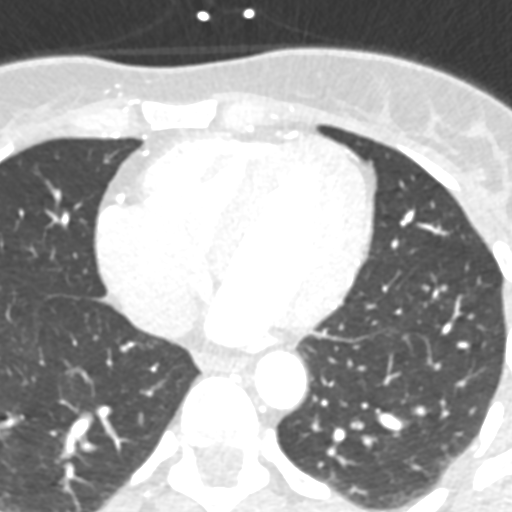
[im 201/301  vessel]
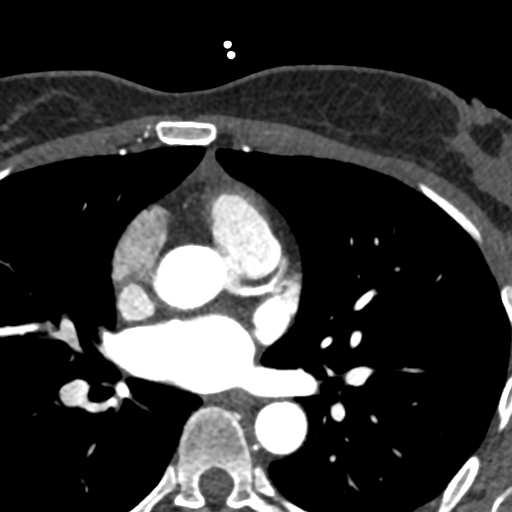

[Series 8: ts syst sharp · axial · 0.39mm/px · z∈[+1342,+1382]mm · 2 of 301 slices shown]
[im 101/301  lung]
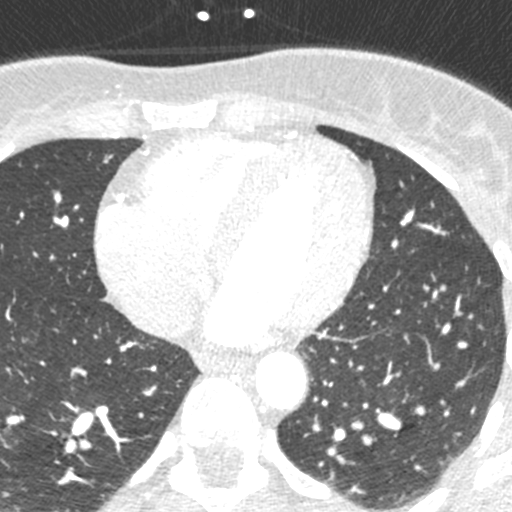
[im 201/301  lung]
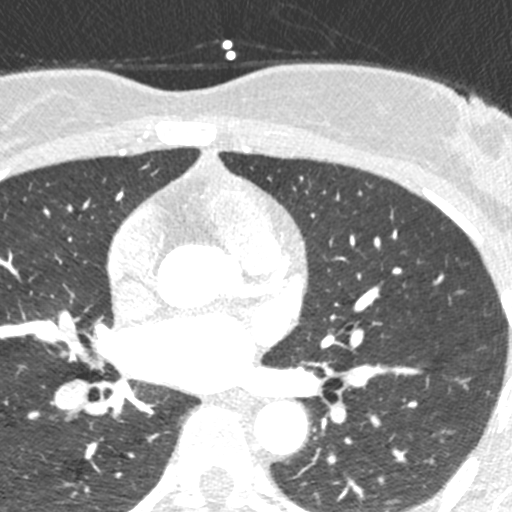

[Series 9: ts diast sharp · axial · 0.39mm/px · z∈[+1342,+1382]mm · 2 of 301 slices shown]
[im 101/301  lung]
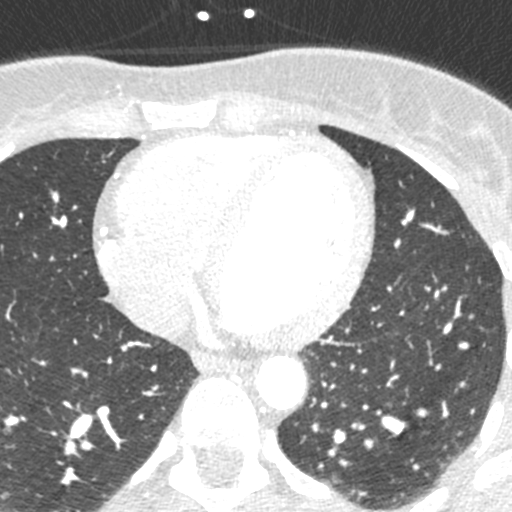
[im 201/301  lung]
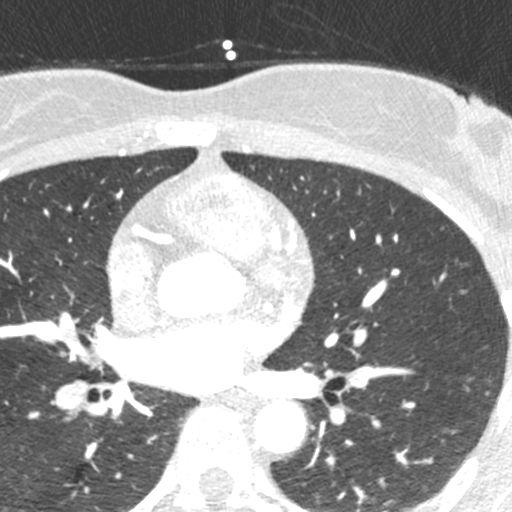

[8 of 20 positions shown; findings below may reference images not displayed]

FINDINGS: Image quality: Good

Noise artifact is: Moderate

Coronary calcium score is 0.

Coronary arteries: Normal coronary origins.  Right dominance.

Right Coronary Artery: No detectable plaque or stenosis.

Left Main Coronary Artery: No detectable plaque or stenosis.

Left Anterior Descending Coronary Artery: No detectable plaque or
stenosis. Brief and superficial intramyocardial course of the mid
LAD.

Left Circumflex Artery: No detectable plaque or stenosis.

Aorta: Normal size, 28 mm at the mid ascending aorta (level of the
PA bifurcation) measured double oblique. No calcifications. No
dissection.

Aortic Valve: No calcifications.

Other findings:

Normal pulmonary vein drainage into the left atrium.

Normal left atrial appendage without thrombus.

Normal size of the pulmonary artery.
IMPRESSION: 1. No evidence of CAD, CADRADS = 0.

2. Coronary calcium score of 0.

3. Normal coronary origins with right dominance.

EXAM:
OVER-READ INTERPRETATION  CT CHEST

The following report is an over-read performed by radiologist Dr.
does not include interpretation of cardiac or coronary anatomy or
pathology. The coronary calcium score/coronary CTA interpretation by
the cardiologist is attached.
FINDINGS: No suspicious nodules, masses, or infiltrates are identified in the
visualized portion of the lungs. No pleural fluid seen.

The visualized portions of the mediastinum and chest wall are
unremarkable.
IMPRESSION: No significant non-cardiac abnormality identified.

*** End of Addendum ***
FINDINGS: Image quality: Good

Noise artifact is: Moderate

Coronary calcium score is 0.

Coronary arteries: Normal coronary origins.  Right dominance.

Right Coronary Artery: No detectable plaque or stenosis.

Left Main Coronary Artery: No detectable plaque or stenosis.

Left Anterior Descending Coronary Artery: No detectable plaque or
stenosis. Brief and superficial intramyocardial course of the mid
LAD.

Left Circumflex Artery: No detectable plaque or stenosis.

Aorta: Normal size, 28 mm at the mid ascending aorta (level of the
PA bifurcation) measured double oblique. No calcifications. No
dissection.

Aortic Valve: No calcifications.

Other findings:

Normal pulmonary vein drainage into the left atrium.

Normal left atrial appendage without thrombus.

Normal size of the pulmonary artery.
IMPRESSION: 1. No evidence of CAD, CADRADS = 0.

2. Coronary calcium score of 0.

3. Normal coronary origins with right dominance.

## 2021-06-02 MED ORDER — NITROGLYCERIN 0.4 MG SL SUBL
SUBLINGUAL_TABLET | SUBLINGUAL | Status: AC
  Filled 2021-06-02: qty 2

## 2021-06-02 MED ORDER — DULOXETINE HCL 30 MG PO CPEP
30.0000 mg | ORAL_CAPSULE | Freq: Every day | ORAL | 1 refills | Status: DC
  Filled 2021-06-02 – 2021-07-14 (×2): qty 30, 30d supply, fill #0

## 2021-06-02 MED ORDER — NITROGLYCERIN 0.4 MG SL SUBL
0.8000 mg | SUBLINGUAL_TABLET | Freq: Once | SUBLINGUAL | Status: AC
  Administered 2021-06-02: 0.8 mg via SUBLINGUAL

## 2021-06-02 MED ORDER — IOHEXOL 350 MG/ML SOLN
100.0000 mL | Freq: Once | INTRAVENOUS | Status: AC | PRN
Start: 2021-06-02 — End: 2021-06-02
  Administered 2021-06-02: 100 mL via INTRAVENOUS

## 2021-06-02 NOTE — Progress Notes (Signed)
?Sharon Bennett ?OFFICE PROGRESS NOTE ? ? ?Diagnosis: Rectal cancer ? ?INTERVAL HISTORY:  ? ?Sharon Bennett underwent a robotic assisted low anterior resection and diverting loop ileostomy by Dr. Dema Severin on 05/10/2021.  A mass was palpated on rectal exam.  No evidence of metastatic disease. ? ?She empties the ileostomy frequently.  The stool is formed.  She complains of discomfort at the left "hip ".  The pain is worse with lying on the left side and ambulating.  No back pain.  Right arm pain has resolved.  The hip discomfort predated surgery. ? ?She has numbness and burning in the toes and feet.  She would like medical therapy ? ?Objective: ? ?Vital signs in last 24 hours: ? ?Blood pressure (!) 137/99, pulse 100, temperature 98.1 ?F (36.7 ?C), temperature source Oral, resp. rate 18, height 5' 6"  (1.676 m), weight 161 lb (73 kg), SpO2 100 %. ?  ? ?HEENT: Neck without mass ?Lymphatics: No cervical, supraclavicular, axillary, or inguinal nodes ?Resp: Lungs clear bilaterally ?Cardio: Regular rate and rhythm ?GI: Right abdomen ileostomy, no hepatosplenomegaly ?Vascular: No leg edema ?Musculoskeletal: Tender at the soft tissue superior to the left trochanter.  No mass.  No rash.  No pain with motion of the left hip. ? ?Lab Results: ? ?Lab Results  ?Component Value Date  ? WBC 3.2 (L) 05/14/2021  ? HGB 10.2 (L) 05/14/2021  ? HCT 32.2 (L) 05/14/2021  ? MCV 90.2 05/14/2021  ? PLT 232 05/14/2021  ? NEUTROABS 6.5 04/19/2021  ? ? ?CMP  ?Lab Results  ?Component Value Date  ? NA 138 05/22/2021  ? K 4.5 05/22/2021  ? CL 98 05/22/2021  ? CO2 20 05/22/2021  ? GLUCOSE 94 05/22/2021  ? BUN 11 05/22/2021  ? CREATININE 0.72 05/22/2021  ? CALCIUM 9.9 05/22/2021  ? PROT 7.5 04/19/2021  ? ALBUMIN 3.7 04/19/2021  ? AST 26 04/19/2021  ? ALT 31 04/19/2021  ? ALKPHOS 62 04/19/2021  ? BILITOT 0.5 04/19/2021  ? GFRNONAA >60 05/14/2021  ? ? ?Lab Results  ?Component Value Date  ? CEA 1.48 09/15/2020  ? ?Imaging: ? ?DG BE (COLON)W SINGLE CM  (SOL OR THIN BA) ? ?Result Date: 05/30/2021 ?CLINICAL DATA:  Rectal cancer ([place]).  THROUGH RECTUM ONLY. EXAM: SINGLE CONTRAST BARIUM ENEMA TECHNIQUE: Initial scout AP supine abdominal image obtained to insure adequate colon cleansing. Water-soluble contrast (Omnipaque) was introduced into the colon in a retrograde fashion and refluxed from the rectum to the cecum. Spot images of the colon followed by overhead radiographs were obtained. FLUOROSCOPY: Radiation Exposure Index (as provided by the fluoroscopic device): 37.0 mGy Kerma COMPARISON:  CT 04/07/2021 FINDINGS: Scout radiograph demonstrates no evidence of bowel obstruction. Postsurgical changes in the region of the rectum. Water-soluble contrast fills the rectosigmoid colon without evidence of contrast leak. Patent colorectal anastomosis. Contrast continues to fill the descending, transverse, and ascending colon to the cecum without abnormality identified. Multiple images were obtained in the lateral, prone, prone oblique, and frontal projections. There is no evidence of a contrast leak. There is a large amount of residual water-soluble contrast after attempted evacuation. IMPRESSION: Patent colorectal anastomosis without evidence of contrast leak. Electronically Signed   By: Maurine Simmering M.D.   On: 05/30/2021 09:06   ? ?Medications: I have reviewed the patient's current medications. ? ? ?Assessment/Plan: ?Rectal cancer  ?CT abdomen/pelvis 08/21/2020-large rectal mass measuring 4.4 x 2.6 x 5.5 cm, haziness in the mesorectal fat, obscuration of the fat plane between the mass and the  adjacent posterior aspect of the uterus, numerous prominent mesorectal lymph nodes measuring up to 5 mm ?Colonoscopy 08/23/2020-fungating infiltrative nonobstructing large mass found in the rectum.  Mass was noncircumferential, measured 5 cm in length, 3 cm in diameter and was 7 cm from the anal verge.  No bleeding was present.   ?Pathology showed invasive colonic adenocarcinoma,  moderately differentiated.  No evidence of mismatch repair deficiency. ?Chest CT 09/01/2020-no evidence of metastatic disease ?MRI pelvis 09/02/2020-tumor at 10.4 cm from anal verge, 5.9 cm from the internal anal sphincter, T3, approximately 15 greater than 5 mm lymph nodes, N2b, multiple uterine fibroids ?Cycle 1 FOLFOX 09/15/2020 ?Cycle 2 FOLFOX 09/29/2020 ?Cycle 3 FOLFOX 10/13/2020 ?Cycle 4 FOLFOX 10/27/2020 ?Cycle 5 FOLFOX 11/10/2020 ?Cycle 6 FOLFOX 11/24/2020 ?Cycle 7 FOLFOX 12/08/2020 ?Cycle 8 FOLFOX 01/05/2021, Ziextenzo ?Radiation/Xeloda 01/23/2021-03/03/2021 ?Robotic assisted low anterior resection/loop ileostomy 05/10/2021-invasive adenocarcinoma, tumor extends into muscularis, negative resection margins, 0/19 nodes, grade 2, no lymphovascular perineural invasion, normal mismatch repair protein expression,pT2pN0 ?2.   rectal bleeding, change in bowel habits secondary to #1 ?3.   Uterine fibroids noted on MRI pelvis 09/02/2020 ?4.   Port-A-Cath placement 09/09/2020, Interventional Radiology, Port-A-Cath removed 04/04/2021 ?5.  Admission 12/14/2020 with febrile neutropenia, E. coli bacteremia-completed an outpatient course of Augmentin ?6.  Right IJ DVT 02/09/2021-apixaban; Right upper extremity Doppler 03/30/2021-improvement in right IJ thrombus, no other ?7.  Right upper extremity pain-03/31/2021 MRI cervical spine-no impingement, inflammation or mass to explain symptoms; disc bulging and ridging at C4-5 to C6-7.  C6-7 posterior cervical laminotomy and foraminotomy and microdiscectomy 04/17/2021 ?8.  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 ?Disposition: ?Sharon Bennett completed total neoadjuvant therapy followed by a low anterior resection for treatment of rectal cancer.  She had a pathologic stage I disease on the final resection specimen.  Her case was presented at the GI tumor conference.  The plan is observation. ? ?We will refer her to orthopedics to evaluate the discomfort at the left upper thigh. ? ?She has some numbness  and burning of the feet.  She would like medical therapy.  She will begin a trial of Cymbalta.  We reviewed potential toxicities associated with Cymbalta. ? ?Sharon Bennett will return for an office visit and restaging CTs in approximately 2 months. ? ?[name], MD ? ?06/02/2021  ?12:45 PM ? ? ?

## 2021-06-09 ENCOUNTER — Telehealth: Payer: Self-pay | Admitting: *Deleted

## 2021-06-09 NOTE — Telephone Encounter (Signed)
Left VM for referral coordinator w/Dr. Jean Rosenthal re: referral for left hip pain. Requested return call if any further information is needed. ?

## 2021-06-12 ENCOUNTER — Encounter: Payer: Self-pay | Admitting: Oncology

## 2021-06-12 ENCOUNTER — Other Ambulatory Visit: Payer: Self-pay | Admitting: *Deleted

## 2021-06-13 ENCOUNTER — Other Ambulatory Visit (HOSPITAL_COMMUNITY): Payer: Self-pay

## 2021-06-14 DIAGNOSIS — Z932 Ileostomy status: Secondary | ICD-10-CM | POA: Diagnosis not present

## 2021-06-14 DIAGNOSIS — C20 Malignant neoplasm of rectum: Secondary | ICD-10-CM | POA: Diagnosis not present

## 2021-06-19 ENCOUNTER — Encounter (HOSPITAL_BASED_OUTPATIENT_CLINIC_OR_DEPARTMENT_OTHER): Payer: Self-pay | Admitting: Family Medicine

## 2021-06-19 ENCOUNTER — Ambulatory Visit (HOSPITAL_BASED_OUTPATIENT_CLINIC_OR_DEPARTMENT_OTHER): Payer: 59 | Admitting: Family Medicine

## 2021-06-19 VITALS — BP 133/95 | HR 97 | Ht 66.0 in | Wt 161.2 lb

## 2021-06-19 DIAGNOSIS — Z600 Problems of adjustment to life-cycle transitions: Secondary | ICD-10-CM | POA: Diagnosis not present

## 2021-06-19 DIAGNOSIS — R079 Chest pain, unspecified: Secondary | ICD-10-CM | POA: Diagnosis not present

## 2021-06-19 NOTE — Progress Notes (Signed)
    Procedures performed today:    None.  Independent interpretation of notes and tests performed by another provider:   None.  Brief History, Exam, Impression, and Recommendations:    BP (!) 133/95   Pulse 97   Ht '5\' 6"'$  (1.676 m)   Wt 161 lb 3.2 oz (73.1 kg)   SpO2 100%   BMI 26.02 kg/m   Chest pain Continues to have some ongoing chest pain.  She did have evaluation with cardiology.  Echocardiogram was completed which was generally reassuring.  During office visit, they discussed that chest pain is most likely related to GERD.  Cardiology also obtained CAC scoring which was 0.  Plan is for patient to follow-up in about 1 month after last appointment. She does continue to have chest pain, likely will need to explore GI evaluation given reassuring cardiac work-up thus far.  May also need to consider starting on beta-blocker due to persistent borderline tachycardia as well as slight elevation in blood pressure  Phase of life problem Patient is tearful today discussing concerns primarily related to her ostomy.  She reports having a lot of issues regarding obtaining proper seal/leakage.  Feels that her ostomy issues keep her from being able to leave her home, reports that she feels she is constantly concerned that if she does go out that she will have leakage issues or problems with her ostomy and thus does not want to be in public.  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  There are eventual plans to reverse ostomy, however patient still needs to have colonoscopy completed prior to having a reversal scheduled and arranged.  She does not have any current thoughts of harming herself or anyone else. Discussed options with patient.  She has previously used FMLA days, she has not specifically aware of how many she has remaining.  Discussed that it may be an  option to use some additional FMLA days if she does have these remaining.  Also discussed possibility of meeting with someone such as a counselor or therapist to further discuss her current issues and concerns.  Indicates that she does have a resource available through her insurance company she believes.  She will check into this and if she does need a referral placed by [place], she will let us know I also discussed that we are also available for appointments should she need to come in to further discuss things or have any further needs regarding paperwork related to returning to work  Plan for follow-up in about 2 months or sooner as needed   ___________________________________________ Andy Moye de [place], MD, ABFM, CAQSM [facility]

## 2021-06-26 ENCOUNTER — Ambulatory Visit: Payer: 59 | Admitting: Orthopaedic Surgery

## 2021-06-26 ENCOUNTER — Ambulatory Visit: Payer: Self-pay

## 2021-06-26 DIAGNOSIS — M25552 Pain in left hip: Secondary | ICD-10-CM | POA: Diagnosis not present

## 2021-06-26 DIAGNOSIS — M7062 Trochanteric bursitis, left hip: Secondary | ICD-10-CM | POA: Diagnosis not present

## 2021-06-26 MED ORDER — LIDOCAINE HCL 1 % IJ SOLN
3.0000 mL | INTRAMUSCULAR | Status: AC | PRN
  Administered 2021-06-26: 3 mL

## 2021-06-26 MED ORDER — METHYLPREDNISOLONE ACETATE 40 MG/ML IJ SUSP
40.0000 mg | INTRAMUSCULAR | Status: AC | PRN
  Administered 2021-06-26: 40 mg via INTRA_ARTICULAR

## 2021-06-26 NOTE — Progress Notes (Signed)
Office Visit Note   Patient: Sharon Bennett           Date of Birth: [birth year]           MRN: [id number] Visit Date: 06/26/2021              Requested by: Ladell Pier, MD [place],  [place] [address] PCP: [place], Blondell Reveal, MD   Assessment & Plan: Visit Diagnoses:  1. Pain in left hip   2. Trochanteric bursitis, left hip     Plan: I was able to show her stretching exercises for her left hip and she demonstrated them back to me.  I recommended a steroid injection over the point of maximal tenderness over trochanteric area and she agreed to this and tolerated well.  She will continue the stretching exercises at least twice daily and can even try Voltaren gel to this area.  I can see her back in 3 months to see how she is doing overall.  If she still having significant hip pain, I would like an AP and lateral of her left hip.  Follow-Up Instructions: Return in about 3 months (around 09/26/2021).   Orders:  Orders Placed This Encounter  Procedures   Large Joint Inj   No orders of the defined types were placed in this encounter.     Procedures: Large Joint Inj: L greater trochanter on 06/26/2021 1:13 PM Indications: pain and diagnostic evaluation Details: 22 G 1.5 in needle, lateral approach  Arthrogram: No  Medications: 3 mL lidocaine 1 %; 40 mg methylPREDNISolone acetate 40 MG/ML Outcome: tolerated well, no immediate complications Procedure, treatment alternatives, risks and benefits explained, specific risks discussed. Consent was given by the patient. Immediately prior to procedure a time out was called to verify the correct patient, procedure, equipment, support staff and site/side marked as required. Patient was prepped and draped in the usual sterile fashion.      Clinical Data: No additional findings.   Subjective: Chief Complaint  Patient presents with   Left Hip - Pain  The patient is a very pleasant 41 year old that I am seeing  for the first time as it relates to left hip pain.  She been having pain with her left hip for the past 5 to 6 months or more.  It hurts only when she lays on that side but occasionally when she goes up and down stairs.  She denies any groin pain.  She has never had any injury to the left hip.  She is a side sleeper and is worse when she sleeps on that side.  She has had surgery for cancer and has an ileostomy.  She sees Dr. Dema Severin with general surgery.  She also is followed by [name] from oncology.  She is not a diabetic  HPI  Review of Systems There is currently listed no fever, chills, nausea, vomiting  Objective: Vital Signs: There were no vitals taken for this visit.  Physical Exam Examination of her left hip shows it moves smoothly and fluidly.  She is alert and orient x3 and in no acute distress there is only pain to palpation over the lateral trochanteric area and the proximal IT band. Ortho Exam  Specialty Comments:  No specialty comments available.  Imaging: No results found.   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   Bacteremia 12/15/2020   Elevated transaminase level 12/15/2020   Genetic testing 10/06/2020   Uterine fibroid 09/22/2020   Family history of pancreatic cancer 09/09/2020   Family history of brain cancer 09/09/2020   Family history of breast cancer 09/09/2020   Rectal cancer ([place]) 09/05/2020   Past Medical History:  Diagnosis Date   Colorectal cancer ([place])    Elevated blood pressure reading 05/22/2021   Family history of brain cancer    Family history of breast cancer    Family history of pancreatic cancer    Tachycardia 05/22/2021    Family History  Problem Relation  Age of Onset   Hypertension Mother    Hypertension Father    Stroke Maternal Aunt    Heart attack Paternal Uncle    Heart failure Paternal Uncle    Pancreatic cancer Maternal Grandfather        dx 52s   Prostate cancer Maternal Grandfather    Brain cancer Paternal Grandmother        dx >50   Breast cancer Other 95       MGF's sister   Endometrial cancer Neg Hx    Ovarian cancer Neg Hx     Past Surgical History:  Procedure Laterality Date   FLEXIBLE SIGMOIDOSCOPY N/A 05/10/2021   Procedure: FLEXIBLE SIGMOIDOSCOPY;  Surgeon: Ileana Roup, MD;  Location: [facility];  Service: General;  Laterality: N/A;   IR IMAGING GUIDED PORT INSERTION  09/09/2020   IR REMOVAL TUN ACCESS W/ PORT W/O FL MOD SED  04/04/2021   LEEP     2015   POSTERIOR CERVICAL LAMINECTOMY N/A 04/19/2021   Procedure: CERVICAL LAMINECTOMY FOR EPIDURAL HEMOATOMA;  Surgeon: Earnie Larsson, MD;  Location: [facility];  Service: Neurosurgery;  Laterality: N/A;   XI ROBOTIC ASSISTED LOWER ANTERIOR RESECTION N/A 05/10/2021   Procedure: XI ROBOTIC ASSISTED LOWER ANTERIOR RESECTION WITH DIVERTING LOOP ILEOSTOMY, INTRAOPERATIVE PERFUSION ASSESSMENT WITH FIREFLY INJECTION;  Surgeon: Ileana Roup, MD;  Location: [facility];  Service: General;  Laterality: N/A;   Social History   Occupational History   Not on file  Tobacco Use   Smoking status: Never   Smokeless tobacco: Never  Vaping Use   Vaping Use: Never used  Substance and Sexual Activity   Alcohol use: Not Currently   Drug use: Not Currently   Sexual activity: Not Currently    Birth control/protection: Pill

## 2021-06-27 ENCOUNTER — Ambulatory Visit (HOSPITAL_BASED_OUTPATIENT_CLINIC_OR_DEPARTMENT_OTHER): Payer: 59 | Admitting: Family

## 2021-06-27 ENCOUNTER — Telehealth (HOSPITAL_BASED_OUTPATIENT_CLINIC_OR_DEPARTMENT_OTHER): Payer: Self-pay | Admitting: Family Medicine

## 2021-06-27 NOTE — Telephone Encounter (Signed)
Pt came in on 5/23 to drop off FMLA paperwork. Has been recently discussed at pt's office visit. Pt is requesting that a call while paperwork is being filled out to assist in the process. She believes that there may be some information that she can provide. Paperwork is needed by 5/31 for claim approval. Informed patient of 7 day turnaround time. Pt exercised understanding. Please fax to number on paperwork. Forms will be in provider's red dot tray. Please advise.

## 2021-06-27 NOTE — Assessment & Plan Note (Signed)
Patient is tearful today discussing concerns primarily related to her ostomy.  She reports having a lot of issues regarding obtaining proper seal/leakage.  Feels that her ostomy issues keep her from being able to leave her home, reports that she feels she is constantly concerned that if she does go out that she will have leakage issues or problems with her ostomy and thus does not want to be in public.  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  There are eventual plans to reverse ostomy, however patient still needs to have colonoscopy completed prior to having a reversal scheduled and arranged.  She does not have any current thoughts of harming herself or anyone else. Discussed options with patient.  She has previously used FMLA days, she has not specifically aware of how many she has remaining.  Discussed that it may be an option to use some additional FMLA days if she does have these remaining.  Also discussed possibility of meeting with someone such as a counselor or therapist to further discuss her current issues and concerns.  Indicates that she does have a resource available through her insurance company she believes.  She will check into this and if she does need a referral placed by [place], she will let us know I also discussed that we are also available for appointments should she need to come in to further discuss things or have any further needs regarding paperwork related to returning to work

## 2021-06-27 NOTE — Assessment & Plan Note (Signed)
Continues to have some ongoing chest pain.  She did have evaluation with cardiology.  Echocardiogram was completed which was generally reassuring.  During office visit, they discussed that chest pain is most likely related to GERD.  Cardiology also obtained CAC scoring which was 0.  Plan is for patient to follow-up in about 1 month after last appointment. She does continue to have chest pain, likely will need to explore GI evaluation given reassuring cardiac work-up thus far.  May also need to consider starting on beta-blocker due to persistent borderline tachycardia as well as slight elevation in blood pressure

## 2021-07-04 DIAGNOSIS — C20 Malignant neoplasm of rectum: Secondary | ICD-10-CM | POA: Diagnosis not present

## 2021-07-04 DIAGNOSIS — Z932 Ileostomy status: Secondary | ICD-10-CM | POA: Diagnosis not present

## 2021-07-04 NOTE — Telephone Encounter (Signed)
Pt was called and FMLA paperwork was completed today by Dr. [place] at 5:10pm. Pt was called and she is aware her paperwork is ready she stated she will pick it up on 5/31 in the morning.

## 2021-07-06 ENCOUNTER — Encounter: Payer: Self-pay | Admitting: Oncology

## 2021-07-10 ENCOUNTER — Encounter (HOSPITAL_COMMUNITY): Payer: Self-pay | Admitting: Surgery

## 2021-07-10 NOTE — Progress Notes (Signed)
Attempted to obtain medical history via telephone, unable to reach at this time. Unable to leave voicemail to return pre surgical testing department's phone call,due to mailbox full.  

## 2021-07-12 ENCOUNTER — Telehealth: Payer: Self-pay

## 2021-07-12 NOTE — Telephone Encounter (Signed)
Patient called in and requested her last two progress note to be faxed [phone].The faxed was success and I called the patient to informed her the documents was send.

## 2021-07-13 ENCOUNTER — Other Ambulatory Visit (HOSPITAL_COMMUNITY): Payer: Self-pay | Admitting: Surgery

## 2021-07-13 ENCOUNTER — Ambulatory Visit: Payer: Self-pay | Admitting: Surgery

## 2021-07-13 NOTE — Progress Notes (Signed)
Sent message, via epic in basket, requesting order in epic from surgeon     07/13/21 1012  Preop Orders  Has preop orders? No  Name of staff/physician contacted for orders(Indicate phone or IB message) C.Dema Severin, MD.

## 2021-07-14 ENCOUNTER — Other Ambulatory Visit (HOSPITAL_COMMUNITY): Payer: Self-pay

## 2021-07-14 ENCOUNTER — Ambulatory Visit (HOSPITAL_BASED_OUTPATIENT_CLINIC_OR_DEPARTMENT_OTHER): Payer: 59 | Admitting: Family

## 2021-07-14 ENCOUNTER — Other Ambulatory Visit (HOSPITAL_BASED_OUTPATIENT_CLINIC_OR_DEPARTMENT_OTHER): Payer: Self-pay | Admitting: Family Medicine

## 2021-07-14 ENCOUNTER — Encounter (HOSPITAL_BASED_OUTPATIENT_CLINIC_OR_DEPARTMENT_OTHER): Payer: Self-pay | Admitting: Family

## 2021-07-14 VITALS — BP 120/86 | HR 95 | Ht 66.0 in | Wt 159.0 lb

## 2021-07-14 DIAGNOSIS — R0789 Other chest pain: Secondary | ICD-10-CM

## 2021-07-14 DIAGNOSIS — R000 Tachycardia, unspecified: Secondary | ICD-10-CM | POA: Diagnosis not present

## 2021-07-14 MED ORDER — PROPRANOLOL HCL 20 MG PO TABS
20.0000 mg | ORAL_TABLET | Freq: Two times a day (BID) | ORAL | 2 refills | Status: AC | PRN
  Filled 2021-07-14: qty 30, 15d supply, fill #0

## 2021-07-14 NOTE — Patient Instructions (Signed)
Medication Instructions:  Your physician has recommended you make the following change in your medication:   Start: Propranolol '20mg'$  twice daily as needed for heart rate greater than 120 beats per minute and palpitations.   *If you need a refill on your cardiac medications before your next appointment, please call your pharmacy*   Lab Work: None ordered today   Testing/Procedures: Recent testing looks great!    Follow-Up: At [facility], you and your health needs are our priority.  As part of our continuing mission to provide you with exceptional heart care, we have created designated Provider Care Teams.  These Care Teams include your primary Cardiologist (physician) and Advanced Practice Providers (APPs -  Physician Assistants and Nurse Practitioners) who all work together to provide you with the care you need, when you need it.  We recommend signing up for the patient portal called "MyChart".  Sign up information is provided on this After Visit Summary.  MyChart is used to connect with patients for Virtual Visits (Telemedicine).  Patients are able to view lab/test results, encounter notes, upcoming appointments, etc.  Non-urgent messages can be sent to your provider as well.   To learn more about what you can do with MyChart, go to [email or web address].    Your next appointment:   1 year(s)  The format for your next appointment:   In Person  Provider:   Skeet Latch, MD{  Other Instructions Heart Healthy Diet Recommendations: A low-salt diet is recommended. Meats should be grilled, baked, or boiled. Avoid fried foods. Focus on lean protein sources like fish or chicken with vegetables and fruits. The American Heart Association is a Microbiologist!  American Heart Association Diet and Lifeystyle Recommendations   Exercise recommendations: The American Heart Association recommends 150 minutes of moderate intensity exercise weekly. Try 30 minutes of moderate intensity  exercise 4-5 times per week. This could include walking, jogging, or swimming.   Important Information About Sugar

## 2021-07-14 NOTE — Progress Notes (Unsigned)
Office Visit    Patient Name: Sharon Bennett Date of Encounter: 07/14/2021  PCP:  [place], Blondell Reveal, MD   [place]  Cardiologist:  None *** Advanced Practice Provider:  No care team member to display Electrophysiologist:  None      Chief Complaint    Sharon Bennett is a 41 y.o. female with a hx of *** presents today for ***   Past Medical History    Past Medical History:  Diagnosis Date   Colorectal cancer ([place])    Elevated blood pressure reading 05/22/2021   Family history of brain cancer    Family history of breast cancer    Family history of pancreatic cancer    Tachycardia 05/22/2021   Past Surgical History:  Procedure Laterality Date   FLEXIBLE SIGMOIDOSCOPY N/A 05/10/2021   Procedure: FLEXIBLE SIGMOIDOSCOPY;  Surgeon: Ileana Roup, MD;  [facility];  Service: General;  Laterality: N/A;   IR IMAGING GUIDED PORT INSERTION  09/09/2020   IR REMOVAL TUN ACCESS W/ PORT W/O FL MOD SED  04/04/2021   LEEP     2015   POSTERIOR CERVICAL LAMINECTOMY N/A 04/19/2021   Procedure: CERVICAL LAMINECTOMY FOR EPIDURAL [place];  Surgeon: Earnie Larsson, MD;  Location: [place];  Service: Neurosurgery;  Laterality: N/A;   XI ROBOTIC ASSISTED LOWER ANTERIOR RESECTION N/A 05/10/2021   Procedure: XI ROBOTIC ASSISTED LOWER ANTERIOR RESECTION WITH DIVERTING LOOP ILEOSTOMY, INTRAOPERATIVE PERFUSION ASSESSMENT WITH FIREFLY INJECTION;  Surgeon: Ileana Roup, MD;  [facility];  Service: General;  Laterality: N/A;    Allergies  Allergies  Allergen Reactions   Codeine Nausea And Vomiting   Dilaudid [Hydromorphone] Nausea And Vomiting   Mushroom Extract Complex Hives   Oxycodone Nausea And Vomiting   Tegaderm Ag Mesh [Silver] Rash    Allergic reaction to Tegaderm dressing    History of Present Illness    Sharon Bennett is a 41 y.o. female with a hx of *** last seen ***.  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  Previously was a surgical tech.   She has been monitoring her blood pressure at home. Her Bp was high at home for about a week afte rher office visit. Then it has come back to goal of less than 130/80.   EKGs/Labs/Other Studies Reviewed:   The following studies were reviewed today: ***  EKG:  EKG is *** ordered today.  The ekg ordered today demonstrates ***  Recent Labs: 03/09/2021: Magnesium 1.9 04/19/2021: ALT 31 05/14/2021: Hemoglobin 10.2; Platelets 232 05/22/2021: BUN 11; Creatinine, Ser 0.72; Potassium 4.5; Sodium 138  Recent Lipid Panel No results found for: "CHOL", "TRIG", "HDL", "CHOLHDL", "VLDL", "LDLCALC", "LDLDIRECT"  Risk Assessment/Calculations:  {Does this patient have ATRIAL FIBRILLATION?:[id number]}  Home Medications   Current Meds  Medication Sig   Drospirenone (SLYND) 4 MG TABS Take 1 tablet by mouth daily continuously   DULoxetine (CYMBALTA) 30 MG capsule Take 1 capsule (30 mg total) by mouth daily.   fexofenadine (ALLEGRA) 180 MG tablet Take 180 mg by mouth daily as needed for allergies or rhinitis.   loperamide (IMODIUM) 2 MG capsule Take 1 capsule (2 mg total) by mouth 2 (two) times daily as needed for diarrhea or loose stools.   melatonin 5 MG TABS Take 5 mg by mouth at bedtime as needed (sleep).   ondansetron (ZOFRAN) 8 MG tablet Take 1 tablet (8 mg total) by mouth every 8 (eight)  hours as needed for nausea or vomiting.   pantoprazole (PROTONIX) 40 MG tablet Take 40 mg by mouth daily.     Review of Systems   ***   All other systems reviewed and are otherwise negative except as noted above.  Physical Exam    VS:  BP 120/86   Pulse 95   Ht '5\' 6"'$  (1.676 m)   Wt 159 lb (72.1 kg)   BMI 25.66 kg/m  , BMI Body mass index is 25.66 kg/m.  Wt Readings from Last 3 Encounters:  07/14/21 159 lb (72.1 kg)  06/19/21 161 lb 3.2 oz (73.1 kg)  06/02/21 161 lb (73 kg)     GEN: Well nourished, well developed, in no acute distress. HEENT: normal. Neck: Supple,  no JVD, carotid bruits, or masses. Cardiac: ***RRR, no murmurs, rubs, or gallops. No clubbing, cyanosis, edema.  ***Radials/PT 2+ and equal bilaterally.  Respiratory:  ***Respirations regular and unlabored, clear to auscultation bilaterally. GI: Soft, nontender, nondistended. MS: No deformity or atrophy. Skin: Warm and dry, no rash. Neuro:  Strength and sensation are intact. Psych: Normal affect.  Assessment & Plan    Tahcycardic -  Chest pain -       Disposition: Follow up {follow up:15908} with None or APP.  Signed, Loel Dubonnet, NP 07/14/2021, 3:26 PM [facility]

## 2021-07-16 ENCOUNTER — Encounter (HOSPITAL_BASED_OUTPATIENT_CLINIC_OR_DEPARTMENT_OTHER): Payer: Self-pay | Admitting: Family

## 2021-07-16 MED ORDER — PANTOPRAZOLE SODIUM 40 MG PO TBEC
40.0000 mg | DELAYED_RELEASE_TABLET | Freq: Every day | ORAL | 3 refills | Status: DC
  Filled 2021-07-16: qty 30, 30d supply, fill #0

## 2021-07-17 ENCOUNTER — Other Ambulatory Visit (HOSPITAL_COMMUNITY): Payer: Self-pay

## 2021-07-18 NOTE — Anesthesia Preprocedure Evaluation (Signed)
Anesthesia Evaluation  Patient identified by MRN, date of birth, ID band Patient awake    Reviewed: Allergy & Precautions, NPO status , Patient's Chart, lab work & pertinent test results  Airway Mallampati: I  TM Distance: >3 FB Neck ROM: Full    Dental no notable dental hx. (+) Teeth Intact   Pulmonary    Pulmonary exam normal breath sounds clear to auscultation       Cardiovascular hypertension, Normal cardiovascular exam Rhythm:Regular Rate:Normal     Neuro/Psych    GI/Hepatic Neg liver ROS, Hx of rectal CA   Endo/Other  negative endocrine ROS  Renal/GU negative Renal ROS     Musculoskeletal   Abdominal   Peds  Hematology   Anesthesia Other Findings   Reproductive/Obstetrics                            Anesthesia Physical Anesthesia Plan  ASA: 2  Anesthesia Plan: MAC   Post-op Pain Management:    Induction:   PONV Risk Score and Plan: Treatment may vary due to age or medical condition  Airway Management Planned: Nasal Cannula and Natural Airway  Additional Equipment: None  Intra-op Plan:   Post-operative Plan:   Informed Consent: I have reviewed the patients History and Physical, chart, labs and discussed the procedure including the risks, benefits and alternatives for the proposed anesthesia with the patient or authorized representative who has indicated his/her understanding and acceptance.     Dental advisory given  Plan Discussed with:   Anesthesia Plan Comments:        Anesthesia Quick Evaluation

## 2021-07-18 NOTE — Progress Notes (Addendum)
Anesthesia Review:  PCP: DR Debbra Riding  LOV 06/19/21  Cardiologist : 07/14/21- Urban Gibson Walker,NP LOV  Chest x-ray :04/19/21-1view  EKG : 05/22/2021  Echo : 05/26/21  03/31/21- Upper vascular studies  06/05/21- Ct [facility]  Stress test: Cardiac Cath :  Activity level: can do a flgiht of stairs without difficulty  Sleep Study/ CPAP : none  Fasting Blood Sugar :      / Checks Blood Sugar -- times a day:   Blood Thinner/ Instructions /Last Dose: ASA / Instructions/ Last Dose :   Recent workup for atypical chest pain.  See tests above.   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  Spoke with Triage at [place] .  No bowel prep needed per office.  PT states at time of preop she has no bowel prep instructions.  PT instructed no solid foods after midnite the nite before surgery and clear liquids from 12 midnite untl 0730am day of surgery.

## 2021-07-19 ENCOUNTER — Ambulatory Visit (HOSPITAL_BASED_OUTPATIENT_CLINIC_OR_DEPARTMENT_OTHER): Payer: 59 | Admitting: Anesthesiology

## 2021-07-19 ENCOUNTER — Other Ambulatory Visit (HOSPITAL_COMMUNITY): Payer: Self-pay | Admitting: *Deleted

## 2021-07-19 ENCOUNTER — Ambulatory Visit (HOSPITAL_COMMUNITY)
Admission: RE | Admit: 2021-07-19 | Discharge: 2021-07-19 | Disposition: A | Discharge status: Home / Self Care | Payer: 59 | Attending: Surgery | Admitting: Surgery

## 2021-07-19 ENCOUNTER — Encounter (HOSPITAL_COMMUNITY): Payer: Self-pay | Admitting: Surgery

## 2021-07-19 ENCOUNTER — Other Ambulatory Visit: Payer: Self-pay

## 2021-07-19 ENCOUNTER — Encounter (HOSPITAL_COMMUNITY)
Admission: RE | Disposition: A | Discharge status: Home / Self Care | Payer: Self-pay | Source: Home / Self Care | Attending: Surgery

## 2021-07-19 ENCOUNTER — Ambulatory Visit (HOSPITAL_COMMUNITY): Payer: 59 | Admitting: Anesthesiology

## 2021-07-19 DIAGNOSIS — I10 Essential (primary) hypertension: Secondary | ICD-10-CM

## 2021-07-19 DIAGNOSIS — Z09 Encounter for follow-up examination after completed treatment for conditions other than malignant neoplasm: Secondary | ICD-10-CM | POA: Diagnosis not present

## 2021-07-19 DIAGNOSIS — K6389 Other specified diseases of intestine: Secondary | ICD-10-CM

## 2021-07-19 DIAGNOSIS — Z85048 Personal history of other malignant neoplasm of rectum, rectosigmoid junction, and anus: Secondary | ICD-10-CM | POA: Diagnosis not present

## 2021-07-19 DIAGNOSIS — Z980 Intestinal bypass and anastomosis status: Secondary | ICD-10-CM | POA: Insufficient documentation

## 2021-07-19 HISTORY — PX: FLEXIBLE SIGMOIDOSCOPY: SHX5431

## 2021-07-19 SURGERY — SIGMOIDOSCOPY, FLEXIBLE
Anesthesia: Monitor Anesthesia Care

## 2021-07-19 MED ORDER — PROPOFOL 500 MG/50ML IV EMUL
INTRAVENOUS | Status: AC
  Filled 2021-07-19: qty 50

## 2021-07-19 MED ORDER — LIDOCAINE 2% (20 MG/ML) 5 ML SYRINGE
INTRAMUSCULAR | Status: DC | PRN
  Administered 2021-07-19: 40 mg via INTRAVENOUS

## 2021-07-19 MED ORDER — PROPOFOL 10 MG/ML IV BOLUS
INTRAVENOUS | Status: DC | PRN
  Administered 2021-07-19: 80 mg via INTRAVENOUS

## 2021-07-19 MED ORDER — PROPOFOL 500 MG/50ML IV EMUL
INTRAVENOUS | Status: DC | PRN
  Administered 2021-07-19: 100 ug/kg/min via INTRAVENOUS

## 2021-07-19 MED ORDER — SODIUM CHLORIDE 0.9 % IV SOLN: INTRAVENOUS | Status: DC

## 2021-07-19 MED ORDER — LACTATED RINGERS IV SOLN: INTRAVENOUS | Status: DC

## 2021-07-19 NOTE — Progress Notes (Signed)
Patient having flex sig with anesthesia.  Per anesthesia OK to sedate with out pregnancy test.

## 2021-07-19 NOTE — Anesthesia Postprocedure Evaluation (Signed)
Anesthesia Post Note  Patient: Sharon Bennett  Procedure(s) Performed: [place]     Patient location during evaluation: Endoscopy Anesthesia Type: MAC Level of consciousness: awake and alert Pain management: pain level controlled Vital Signs Assessment: post-procedure vital signs reviewed and stable Respiratory status: spontaneous breathing, nonlabored ventilation, respiratory function stable and patient connected to nasal cannula oxygen Cardiovascular status: blood pressure returned to baseline and stable Postop Assessment: no apparent nausea or vomiting Anesthetic complications: no   No notable events documented.  Last Vitals:  Vitals:   07/19/21 1608 07/19/21 1609  BP:  (!) 125/99  Pulse: 77 77  Resp: (!) 9 10  Temp:    SpO2: 100% 100%    Last Pain:  Vitals:   07/19/21 1524  TempSrc: Temporal  PainSc: 0-No pain                 Barnet Glasgow

## 2021-07-19 NOTE — H&P (Signed)
CC: Here today for flexible sigmoidoscopy  HPI: Sharon Bennett is an 41 y.o. female with no known medical hx whom is seen in the office today for follow-up evaluation of rectal cancer  Colonoscopy with Dr. Benson Norway 08/23/20 - nonobstructing large mass in rectum 5 cm in length estimated 7 cm from anal verge. 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 No evident mismatch repair deficiencies.  CT CAP 7/17 & 7/28 shows mass in rectum likely involvement of posterior uterus, suspicious mesorectal adenopathy but no evidence of distant metastatic disease  Pelvic MRI 09/02/20 - CmriT3N2b; crm preserved; +EMVI; 5 cm from internal anal sphincter  She met with [name] with medical oncology. He is planning to initiate TNT. A referral was placed to the genetic counselors  She reports some changes with regards to urgency and frequent bowel movements over the last couple of months. She have occasional rectal bleeding contributed this to some hemorrhoidal issues she had dealt with back when she was pregnant. She has 2 children whom are 89 and 19 years old.  cXRT 01/23/21 --> 03/03/21. 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 Scheduled for surgery 05/10/21.   OR 05/10/21 -  1. Robotic assisted low anterior resection with double stapled colorectal anastomosis 2. Creation of diverting loop ileostomy 3. Intraoperative assessment of perfusion using ICG fluorescence imaging 4. Flexible sigmoidoscopy 5. Bilateral transversus abdominus plane (TAP) blocks  Findings: Palpable mass anteriorly based just palpable at tip of my finger. No evidence of metastatic disease on visceral, parietal peritoneum or liver. Grossly it appeared we were well below the tumor but once we opened specimen on the backtable it appears that we had approximately 1 cm distal margin. Therefore, we opted to take additional rectum to ensure no threatened margin - we took additional ~2 cm of rectum to ensure  this. A well perfused, tension free, hemostatic, air tight 31 mm EEA colorectal anastomosis fashioned 5 cm from the anal verge by flexible sigmoidoscopy.  Admitted postoperatively and recovered well. She was discharged home on 05/14/21  Path:  A. COLON, RECTOSIGMOID, RESECTION:  - Invasive colorectal adenocarcinoma, 2.7 cm.  - Tumor extends into muscularis propria.  - All surgical margins negative for carcinoma.  - Eighteen lymph nodes negative for metastatic carcinoma (0/18).  - See oncology table.   B. RECTUM, ADDITIONAL, EXCISION:  - Benign colon.  - Negative for carcinoma.  - One lymph node negative for metastatic carcinoma (0/1).   C. ANASTOMOSIS DONUT, FINAL DISTAL MARGIN, EXCISION:  - Benign colon.  - Negative for carcinoma.   She is here today for follow-up and has been doing great. She is following with the wound ostomy nurses as an outpatient. Trialing different bags at this time. Denies nausea or vomiting. Ostomy output has been toothpaste in consistency and quite low in volume-less than 1 L/day. She is maintaining hydration well.  INTERVAL HX Gastrograffin enema 4/25 - clear, no evident leak Flex sig today  She has been doing reasonably well. No changes in her health/health hx reported since we met in the office.   PMH: Denies  PSH: Denies  FHx: Denies any known family history of colorectal, breast, endometrial or ovarian cancer. Her father died at age 12 from what was reported to be bilateral pneumonia.  Social Hx: Denies use of tobacco/EtOH/illicit drug. 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 I have reviewed this information and discussed  as appropriate with the patient. See HPI as well for other ROS.  Past Medical History:  Diagnosis Date   Colorectal cancer ([place])    Elevated blood pressure reading 05/22/2021   Family history of brain cancer    Family history of breast cancer     Family history of pancreatic cancer    Tachycardia 05/22/2021    Past Surgical History:  Procedure Laterality Date   FLEXIBLE SIGMOIDOSCOPY N/A 05/10/2021   Procedure: FLEXIBLE SIGMOIDOSCOPY;  Surgeon: Ileana Roup, MD;  [facility];  Service: General;  Laterality: N/A;   IR IMAGING GUIDED PORT INSERTION  09/09/2020   IR REMOVAL TUN ACCESS W/ PORT W/O FL MOD SED  04/04/2021   LEEP     2015   POSTERIOR CERVICAL LAMINECTOMY N/A 04/19/2021   Procedure: CERVICAL LAMINECTOMY FOR EPIDURAL [place];  Surgeon: Earnie Larsson, MD;  Location: [place];  Service: Neurosurgery;  Laterality: N/A;   XI ROBOTIC ASSISTED LOWER ANTERIOR RESECTION N/A 05/10/2021   Procedure: XI ROBOTIC ASSISTED LOWER ANTERIOR RESECTION WITH DIVERTING LOOP ILEOSTOMY, INTRAOPERATIVE PERFUSION ASSESSMENT WITH FIREFLY INJECTION;  Surgeon: Ileana Roup, MD;  [facility];  Service: General;  Laterality: N/A;    Family History  Problem Relation Age of Onset   Hypertension Mother    Hypertension Father    Stroke Maternal Aunt    Heart attack Paternal Uncle    Heart failure Paternal Uncle    Pancreatic cancer Maternal Grandfather        dx 79s   Prostate cancer Maternal Grandfather    Brain cancer Paternal Grandmother        dx >50   Breast cancer Other 35       MGF's sister   Endometrial cancer Neg Hx    Ovarian cancer Neg Hx     Social:  reports that she has never smoked. She has never used smokeless tobacco. She reports that she does not currently use alcohol. She reports that she does not currently use drugs.  Allergies:  Allergies  Allergen Reactions   Codeine Nausea And Vomiting   Dilaudid [Hydromorphone] Nausea And Vomiting   Mushroom Extract Complex Hives   Oxycodone Nausea And Vomiting   Tegaderm Ag Mesh [Silver] Rash    Allergic reaction to Tegaderm dressing    Medications: I have reviewed the patient's current medications.  No results found for this or any previous visit (from the  past 48 hour(s)).  No results found.  ROS - all of the below systems have been reviewed with the patient and positives are indicated with bold text General: chills, fever or night sweats Eyes: blurry vision or double vision ENT: epistaxis or sore throat Allergy/Immunology: itchy/watery eyes or nasal congestion Hematologic/Lymphatic: bleeding problems, blood clots or swollen lymph nodes Endocrine: temperature intolerance or unexpected weight changes Breast: new or changing breast lumps or nipple discharge Resp: cough, shortness of breath, or wheezing CV: chest pain or dyspnea on exertion GI: as per HPI GU: dysuria, trouble voiding, or hematuria MSK: joint pain or joint stiffness Neuro: TIA or stroke symptoms Derm: pruritus and skin lesion changes Psych: anxiety and depression  PE There were no vitals taken for this visit. Constitutional: NAD; conversant Eyes: Moist conjunctiva; no lid lag; anicteric Lungs: Normal respiratory effort CV: RRR GI: Abd soft, NT/ND; no palpable hepatosplenomegaly MSK: Normal range of motion of extremities Psychiatric: Appropriate affect; alert and oriented x3  No results found for this or any previous visit (from the past 48 hour(s)).  No results found.    A/P: Sharon Bennett is an 41 y.o. female with locally advanced rectal cancer  CmriT3N2bM0; 5 cm proximal to IAS on MRI; appears to be anteriorly based and in the mid rectum on imaging  IR placed R IJ PAC  Genetics evaluation completed, no evident pathogenic mutations   -She had mentioned to [place] that she had a history of abnormal Pap smear and having had a cervical LEEP. She has fibroids, largest is 4 cm. Saw gyn onc with tentative plan for observation  -The anatomy and physiology of the GI tract was reviewed with her again today as well as the pathophysiology of rectal cancer with associated illustrations -Completed TNT with consolidation cXRT 03/03/21  CT CAP 04/07/21 -decreased size of  the lower rectal mass. No evidence of lymphadenopathy or metastatic disease noted on the CT scan.  OR 05/10/21 - robotic LAR/DLI [id number]  -She is doing great but with issues with bag leaks. Seeing WOCN team as well. Has tried belts and convex pouches. Stoma is protruding nicely.   -GGE clear  Flexible sigmoidoscopy scheduled for today.  We have discussed the procedure, material risks (including but not limited to pain, bleeding, injury to colon/rectum, injury to other organs, aspiration/pneumonia, stroke, death), benefits and alternatives today. If flexible sigmoidoscopy clear, will proceed in near future with loop ileostomy reversal.  Nadeen Landau, MD [facility], [place]

## 2021-07-19 NOTE — Transfer of Care (Signed)
Immediate Anesthesia Transfer of Care Note  Patient: [name]  Procedure(s) Performed: FLEXIBLE SIGMOIDOSCOPY  Patient Location: PACU  Anesthesia Type:MAC  Level of Consciousness: awake, alert , oriented and patient cooperative  Airway & Oxygen Therapy: Patient Spontanous Breathing and Patient connected to face mask oxygen  Post-op Assessment: Report given to RN and Post -op Vital signs reviewed and stable  Post vital signs: Reviewed and stable  Last Vitals:  Vitals Value Taken Time  BP    Temp    Pulse    Resp    SpO2      Last Pain:  Vitals:   07/19/21 1306  TempSrc: Temporal  PainSc: 0-No pain         Complications: No notable events documented.

## 2021-07-19 NOTE — Op Note (Addendum)
[facility] Patient Name: Sharon Bennett Procedure Date: 07/19/2021 MRN: [id number] Attending MD: Ileana Roup MD, MD Date of Birth: [birth year] CSN: [id number] Age: 41 Admit Type: Inpatient Procedure:                Flexible Sigmoidoscopy Indications:              Monitoring for anastomosis integrity Providers:                Sharon Mt. Saundra Gin MD, MD, Ladoris Gene, RN,                            Darliss Cheney, Technician, Silverio Lay,                            Technician Referring MD:              Medicines:                Monitored Anesthesia Care Complications:            No immediate complications. Estimated Blood Loss:     Estimated blood loss: none. Procedure:                Pre-Anesthesia Assessment:                           - Prior to the procedure, a History and Physical                            was performed, and patient medications, allergies                            and sensitivities were reviewed. The patient's                            tolerance of previous anesthesia was reviewed.                           - The risks and benefits of the procedure and the                            sedation options and risks were discussed with the                            patient. All questions were answered and informed                            consent was obtained.                           After obtaining informed consent, the scope was                            passed under direct vision. The [id number]-[id number] ([id number])                            Olympus endoscope was introduced through the anus  and advanced to the the splenic flexure. The                            flexible sigmoidoscopy was accomplished without                            difficulty. The patient tolerated the procedure                            well. The patient tolerated the procedure well. The                            quality of the bowel  preparation was adequate. Scope In: 3:10:05 PM Scope Out: 3:16:23 PM Total Procedure Duration: 0 hours 6 minutes 18 seconds  Findings:      The perianal and digital rectal examinations were normal. Pertinent       negatives include normal sphincter tone and no palpable rectal lesions.      There was evidence of a prior end-to-end colo-colonic anastomosis in the       mid rectum. This was patent and was characterized by healthy appearing       mucosa. The anastomosis was traversed.      A patchy area of granular mucosa was found in the sigmoid colon,       descending colon - consistent with mild diversion 'colitis.' Impression:               - No specimens collected. Moderate Sedation:      Not Applicable - Patient had care per Anesthesia. Recommendation:           - Resume previous diet indefinitely. Procedure Code(s):        --- Professional ---                           [phone], Sigmoidoscopy, flexible; diagnostic,                            including collection of specimen(s) by brushing or                            washing, when performed (separate procedure) Diagnosis Code(s):        --- Professional ---                           Z98.0, Intestinal bypass and anastomosis status                           Z09, Encounter for follow-up examination after                            completed treatment for conditions other than                            malignant neoplasm CPT copyright 2019 [employer] Medical Association. All rights reserved. The codes documented in this report are preliminary and upon coder review may  be revised to meet current compliance requirements. Nadeen Landau, MD Ileana Roup MD,  MD 07/19/2021 3:23:25 PM This report has been signed electronically. Number of Addenda: 0

## 2021-07-19 NOTE — Progress Notes (Addendum)
DUE TO COVID-19 ONLY  2  VISITOR IS ALLOWED TO COME WITH YOU AND STAY IN THE WAITING ROOM ONLY DURING PRE OP AND PROCEDURE DAY OF SURGERY.  4 VISITOR  MAY VISIT WITH YOU AFTER SURGERY IN YOUR PRIVATE ROOM DURING VISITING HOURS ONLY! YOU MAY HAVE ONE PERSON SPEND THE NITE WITH YOU IN YOUR ROOM AFTER SURGERY.      Your procedure is scheduled on:      07/31/21   Report to [facility] Main  Entrance   Report to admitting at    0815 am              AM DO NOT BRING INSURANCE CARD, PICTURE ID OR WALLET DAY OF SURGERY.      Call this number if you have problems the morning of surgery [phone]   Follow bowel prep instructions per MD.    REMEMBER: NO  SOLID FOODS , CANDY, GUM OR MINTS AFTER [place] .       Marland Kitchen CLEAR LIQUIDS UNTIL      0730am           DAY OF SURGERY.     CLEAR LIQUID DIET   Foods Allowed      WATER BLACK COFFEE ( SUGAR OK, NO MILK, CREAM OR CREAMER) REGULAR AND DECAF  TEA ( SUGAR OK NO MILK, CREAM, OR CREAMER) REGULAR AND DECAF  PLAIN JELLO ( NO RED)  FRUIT ICES ( NO RED, NO FRUIT PULP)  POPSICLES ( NO RED)  JUICE- APPLE, WHITE GRAPE AND WHITE CRANBERRY  SPORT DRINK LIKE GATORADE ( NO RED)  CLEAR BROTH ( VEGETABLE , CHICKEN OR BEEF)                                                                     BRUSH YOUR TEETH MORNING OF SURGERY AND RINSE YOUR MOUTH OUT, NO CHEWING GUM CANDY OR MINTS.     Take these medicines the morning of surgery with A SIP OF WATER:  cymbalta, protonix, propanolol if needed    DO NOT TAKE ANY DIABETIC MEDICATIONS DAY OF YOUR SURGERY                               You may not have any metal on your body including hair pins and              piercings  Do not wear jewelry, make-up, lotions, powders or perfumes, deodorant             Do not wear nail polish on your fingernails.              IF YOU ARE A FEMALE AND WANT TO SHAVE UNDER ARMS OR LEGS PRIOR TO SURGERY YOU MUST DO SO AT LEAST 48 HOURS PRIOR TO  SURGERY.              Men may shave face and neck.   Do not bring valuables to the hospital. [place].  Contacts, dentures or bridgework may not be worn into surgery.  Leave suitcase in  the car. After surgery it may be brought to your room.     Patients discharged the day of surgery will not be allowed to drive home. IF YOU ARE HAVING SURGERY AND GOING HOME THE SAME DAY, YOU MUST HAVE AN ADULT TO DRIVE YOU HOME AND BE WITH YOU FOR 24 HOURS. YOU MAY GO HOME BY TAXI OR UBER OR ORTHERWISE, BUT AN ADULT MUST ACCOMPANY YOU HOME AND STAY WITH YOU FOR 24 HOURS.                Please read over the following fact sheets you were given: _____________________________________________________________________  [facility] - Preparing for Surgery Before surgery, you can play an important role.  Because skin is not sterile, your skin needs to be as free of germs as possible.  You can reduce the number of germs on your skin by washing with CHG (chlorahexidine gluconate) soap before surgery.  CHG is an antiseptic cleaner which kills germs and bonds with the skin to continue killing germs even after washing. Please DO NOT use if you have an allergy to CHG or antibacterial soaps.  If your skin becomes reddened/irritated stop using the CHG and inform your nurse when you arrive at Short Stay. Do not shave (including legs and underarms) for at least 48 hours prior to the first CHG shower.  You may shave your face/neck. Please follow these instructions carefully:  1.  Shower with CHG Soap the night before surgery and the  morning of Surgery.  2.  If you choose to wash your hair, wash your hair first as usual with your  normal  shampoo.  3.  After you shampoo, rinse your hair and body thoroughly to remove the  shampoo.                           4.  Use CHG as you would any other liquid soap.  You can apply chg directly  to the skin and wash                       Gently  with a scrungie or clean washcloth.  5.  Apply the CHG Soap to your body ONLY FROM THE NECK DOWN.   Do not use on face/ open                           Wound or open sores. Avoid contact with eyes, ears mouth and genitals (private parts).                       Wash face,  Genitals (private parts) with your normal soap.             6.  Wash thoroughly, paying special attention to the area where your surgery  will be performed.  7.  Thoroughly rinse your body with warm water from the neck down.  8.  DO NOT shower/wash with your normal soap after using and rinsing off  the CHG Soap.                9.  Pat yourself dry with a clean towel.            10.  Wear clean pajamas.            11.  Place clean sheets on your bed the night of your first shower and do  not  sleep with pets. Day of Surgery : Do not apply any lotions/deodorants the morning of surgery.  Please wear clean clothes to the hospital/surgery center.  FAILURE TO FOLLOW THESE INSTRUCTIONS MAY RESULT IN THE CANCELLATION OF YOUR SURGERY PATIENT SIGNATURE_________________________________  NURSE SIGNATURE__________________________________  ________________________________________________________________________

## 2021-07-20 ENCOUNTER — Other Ambulatory Visit: Payer: Self-pay

## 2021-07-20 ENCOUNTER — Encounter (HOSPITAL_COMMUNITY): Payer: Self-pay

## 2021-07-20 ENCOUNTER — Encounter (HOSPITAL_COMMUNITY): Payer: Self-pay | Admitting: Surgery

## 2021-07-20 ENCOUNTER — Encounter (HOSPITAL_COMMUNITY)
Admission: RE | Admit: 2021-07-20 | Discharge: 2021-07-20 | Disposition: A | Discharge status: Home / Self Care | Payer: 59 | Source: Ambulatory Visit | Attending: Surgery | Admitting: Surgery

## 2021-07-20 DIAGNOSIS — Z01812 Encounter for preprocedural laboratory examination: Secondary | ICD-10-CM | POA: Diagnosis not present

## 2021-07-20 DIAGNOSIS — Z01818 Encounter for other preprocedural examination: Secondary | ICD-10-CM

## 2021-07-20 HISTORY — DX: Other specified postprocedural states: Z98.890

## 2021-07-20 HISTORY — DX: Anxiety disorder, unspecified: F41.9

## 2021-07-20 HISTORY — DX: Depression, unspecified: F32.A

## 2021-07-20 HISTORY — DX: Gastro-esophageal reflux disease without esophagitis: K21.9

## 2021-07-20 LAB — CBC
HCT: 38 % (ref 36.0–46.0)
Hemoglobin: 12.1 g/dL (ref 12.0–15.0)
MCH: 26.4 pg (ref 26.0–34.0)
MCHC: 31.8 g/dL (ref 30.0–36.0)
MCV: 83 fL (ref 80.0–100.0)
Platelets: 270 10*3/uL (ref 150–400)
RBC: 4.58 MIL/uL (ref 3.87–5.11)
RDW: 14.2 % (ref 11.5–15.5)
WBC: 3.6 10*3/uL — ABNORMAL LOW (ref 4.0–10.5)
nRBC: 0 % (ref 0.0–0.2)

## 2021-07-20 LAB — TYPE AND SCREEN
ABO/RH(D): O POS
Antibody Screen: NEGATIVE

## 2021-07-23 ENCOUNTER — Encounter (HOSPITAL_COMMUNITY): Payer: Self-pay | Admitting: Surgery

## 2021-07-26 DIAGNOSIS — L910 Hypertrophic scar: Secondary | ICD-10-CM | POA: Diagnosis not present

## 2021-07-31 ENCOUNTER — Encounter (HOSPITAL_COMMUNITY): Payer: Self-pay | Admitting: Surgery

## 2021-07-31 ENCOUNTER — Other Ambulatory Visit: Payer: Self-pay

## 2021-07-31 ENCOUNTER — Inpatient Hospital Stay (HOSPITAL_COMMUNITY)
Admission: RE | Admit: 2021-07-31 | Discharge: 2021-08-05 | DRG: 330 | Disposition: A | Discharge status: Home / Self Care | Payer: 59 | Attending: Surgery | Admitting: Surgery

## 2021-07-31 ENCOUNTER — Encounter (HOSPITAL_COMMUNITY)
Admission: RE | Disposition: A | Discharge status: Home / Self Care | Payer: Self-pay | Source: Home / Self Care | Attending: Surgery

## 2021-07-31 ENCOUNTER — Inpatient Hospital Stay (HOSPITAL_COMMUNITY): Payer: 59 | Admitting: Physician Assistant

## 2021-07-31 ENCOUNTER — Inpatient Hospital Stay (HOSPITAL_COMMUNITY): Payer: 59

## 2021-07-31 DIAGNOSIS — Z9889 Other specified postprocedural states: Secondary | ICD-10-CM | POA: Diagnosis present

## 2021-07-31 DIAGNOSIS — R197 Diarrhea, unspecified: Secondary | ICD-10-CM | POA: Diagnosis present

## 2021-07-31 DIAGNOSIS — Z432 Encounter for attention to ileostomy: Secondary | ICD-10-CM | POA: Diagnosis not present

## 2021-07-31 DIAGNOSIS — Z01818 Encounter for other preprocedural examination: Principal | ICD-10-CM

## 2021-07-31 DIAGNOSIS — Z9049 Acquired absence of other specified parts of digestive tract: Secondary | ICD-10-CM

## 2021-07-31 DIAGNOSIS — F419 Anxiety disorder, unspecified: Secondary | ICD-10-CM | POA: Diagnosis not present

## 2021-07-31 DIAGNOSIS — K219 Gastro-esophageal reflux disease without esophagitis: Secondary | ICD-10-CM | POA: Diagnosis not present

## 2021-07-31 DIAGNOSIS — Z885 Allergy status to narcotic agent status: Secondary | ICD-10-CM

## 2021-07-31 DIAGNOSIS — Z91048 Other nonmedicinal substance allergy status: Secondary | ICD-10-CM | POA: Diagnosis not present

## 2021-07-31 DIAGNOSIS — F32A Depression, unspecified: Secondary | ICD-10-CM | POA: Diagnosis not present

## 2021-07-31 DIAGNOSIS — K9419 Other complications of enterostomy: Secondary | ICD-10-CM | POA: Diagnosis not present

## 2021-07-31 DIAGNOSIS — Z9102 Food additives allergy status: Secondary | ICD-10-CM

## 2021-07-31 DIAGNOSIS — C19 Malignant neoplasm of rectosigmoid junction: Secondary | ICD-10-CM | POA: Diagnosis present

## 2021-07-31 HISTORY — DX: Bursopathy, unspecified: M71.9

## 2021-07-31 HISTORY — PX: ILEOSTOMY CLOSURE: SHX1784

## 2021-07-31 LAB — PREGNANCY, URINE: Preg Test, Ur: NEGATIVE

## 2021-07-31 SURGERY — CLOSURE, ILEOSTOMY
Anesthesia: General

## 2021-07-31 MED ORDER — LACTATED RINGERS IV SOLN: INTRAVENOUS | Status: DC

## 2021-07-31 MED ORDER — HYDRALAZINE HCL 20 MG/ML IJ SOLN: 10.0000 mg | INTRAMUSCULAR | Status: DC | PRN

## 2021-07-31 MED ORDER — ALVIMOPAN 12 MG PO CAPS
12.0000 mg | ORAL_CAPSULE | Freq: Two times a day (BID) | ORAL | Status: DC
  Administered 2021-08-01 – 2021-08-02 (×4): 12 mg via ORAL
  Filled 2021-07-31 (×4): qty 1

## 2021-07-31 MED ORDER — SIMETHICONE 80 MG PO CHEW: 40.0000 mg | CHEWABLE_TABLET | Freq: Four times a day (QID) | ORAL | Status: DC | PRN

## 2021-07-31 MED ORDER — LIDOCAINE HCL (PF) 2 % IJ SOLN
INTRAMUSCULAR | Status: AC
  Filled 2021-07-31: qty 5

## 2021-07-31 MED ORDER — MORPHINE SULFATE (PF) 2 MG/ML IV SOLN
1.0000 mg | INTRAVENOUS | Status: DC | PRN
  Administered 2021-07-31: 2 mg via INTRAVENOUS

## 2021-07-31 MED ORDER — SCOPOLAMINE 1 MG/3DAYS TD PT72
MEDICATED_PATCH | TRANSDERMAL | Status: AC
  Filled 2021-07-31: qty 1

## 2021-07-31 MED ORDER — ROCURONIUM BROMIDE 10 MG/ML (PF) SYRINGE
PREFILLED_SYRINGE | INTRAVENOUS | Status: DC | PRN
  Administered 2021-07-31: 80 mg via INTRAVENOUS

## 2021-07-31 MED ORDER — MORPHINE SULFATE (PF) 2 MG/ML IV SOLN
INTRAVENOUS | Status: AC
  Administered 2021-07-31: 2 mg via INTRAVENOUS
  Filled 2021-07-31: qty 1

## 2021-07-31 MED ORDER — IBUPROFEN 200 MG PO TABS
600.0000 mg | ORAL_TABLET | Freq: Four times a day (QID) | ORAL | Status: DC | PRN
  Administered 2021-07-31 – 2021-08-03 (×7): 600 mg via ORAL
  Filled 2021-07-31 (×7): qty 3

## 2021-07-31 MED ORDER — ONDANSETRON HCL 4 MG/2ML IJ SOLN
INTRAMUSCULAR | Status: AC
  Filled 2021-07-31: qty 2

## 2021-07-31 MED ORDER — SODIUM CHLORIDE 0.9 % IV SOLN
INTRAVENOUS | Status: AC
  Filled 2021-07-31: qty 2

## 2021-07-31 MED ORDER — ACETAMINOPHEN 500 MG PO TABS
ORAL_TABLET | ORAL | Status: AC
  Administered 2021-07-31: 1000 mg via ORAL
  Filled 2021-07-31: qty 2

## 2021-07-31 MED ORDER — SODIUM CHLORIDE 0.9 % IV SOLN: 12.5000 mg | Freq: Four times a day (QID) | INTRAVENOUS | Status: DC | PRN

## 2021-07-31 MED ORDER — MORPHINE SULFATE (PF) 4 MG/ML IV SOLN
INTRAVENOUS | Status: AC
  Filled 2021-07-31: qty 1

## 2021-07-31 MED ORDER — MIDAZOLAM HCL 2 MG/2ML IJ SOLN
INTRAMUSCULAR | Status: AC
  Filled 2021-07-31: qty 2

## 2021-07-31 MED ORDER — 0.9 % SODIUM CHLORIDE (POUR BTL) OPTIME
TOPICAL | Status: DC | PRN
  Administered 2021-07-31: 1000 mL

## 2021-07-31 MED ORDER — PANTOPRAZOLE SODIUM 40 MG PO TBEC
40.0000 mg | DELAYED_RELEASE_TABLET | Freq: Every day | ORAL | Status: DC
  Administered 2021-08-01 – 2021-08-05 (×5): 40 mg via ORAL
  Filled 2021-07-31 (×5): qty 1

## 2021-07-31 MED ORDER — CHLORHEXIDINE GLUCONATE CLOTH 2 % EX PADS: 6.0000 | MEDICATED_PAD | Freq: Once | CUTANEOUS | Status: AC

## 2021-07-31 MED ORDER — LIDOCAINE 2% (20 MG/ML) 5 ML SYRINGE
INTRAMUSCULAR | Status: DC | PRN
  Administered 2021-07-31: 100 mg via INTRAVENOUS
  Administered 2021-07-31: 1.5 mg/kg/h via INTRAVENOUS

## 2021-07-31 MED ORDER — DEXAMETHASONE SODIUM PHOSPHATE 10 MG/ML IJ SOLN
INTRAMUSCULAR | Status: AC
  Filled 2021-07-31: qty 1

## 2021-07-31 MED ORDER — DIAZEPAM 5 MG PO TABS
5.0000 mg | ORAL_TABLET | Freq: Four times a day (QID) | ORAL | Status: DC | PRN
Start: 2021-07-31 — End: 2021-08-05
  Administered 2021-07-31 – 2021-08-05 (×8): 5 mg via ORAL
  Filled 2021-07-31 (×8): qty 1

## 2021-07-31 MED ORDER — OXYCODONE HCL 5 MG PO TABS: 10.0000 mg | ORAL_TABLET | ORAL | Status: DC | PRN

## 2021-07-31 MED ORDER — ENSURE SURGERY PO LIQD
237.0000 mL | Freq: Two times a day (BID) | ORAL | Status: DC
  Administered 2021-08-01: 237 mL via ORAL

## 2021-07-31 MED ORDER — KETOROLAC TROMETHAMINE 30 MG/ML IJ SOLN
INTRAMUSCULAR | Status: AC
  Filled 2021-07-31: qty 1

## 2021-07-31 MED ORDER — BUPIVACAINE-EPINEPHRINE 0.25% -1:200000 IJ SOLN
INTRAMUSCULAR | Status: AC
  Filled 2021-07-31: qty 1

## 2021-07-31 MED ORDER — ALVIMOPAN 12 MG PO CAPS: 12.0000 mg | ORAL_CAPSULE | ORAL | Status: AC

## 2021-07-31 MED ORDER — ONDANSETRON HCL 4 MG PO TABS: 4.0000 mg | ORAL_TABLET | Freq: Four times a day (QID) | ORAL | Status: DC | PRN

## 2021-07-31 MED ORDER — ACETAMINOPHEN 325 MG PO TABS
650.0000 mg | ORAL_TABLET | Freq: Four times a day (QID) | ORAL | Status: DC
  Administered 2021-08-01 – 2021-08-05 (×14): 650 mg via ORAL
  Filled 2021-07-31 (×16): qty 2

## 2021-07-31 MED ORDER — BUPIVACAINE-EPINEPHRINE (PF) 0.25% -1:200000 IJ SOLN
INTRAMUSCULAR | Status: DC | PRN
  Administered 2021-07-31: 30 mL

## 2021-07-31 MED ORDER — PROPOFOL 10 MG/ML IV BOLUS
INTRAVENOUS | Status: DC | PRN
  Administered 2021-07-31: 150 mg via INTRAVENOUS

## 2021-07-31 MED ORDER — DEXAMETHASONE SODIUM PHOSPHATE 10 MG/ML IJ SOLN
INTRAMUSCULAR | Status: DC | PRN
  Administered 2021-07-31: 10 mg via INTRAVENOUS

## 2021-07-31 MED ORDER — MORPHINE SULFATE (PF) 2 MG/ML IV SOLN: 2.0000 mg | INTRAVENOUS | Status: DC | PRN

## 2021-07-31 MED ORDER — HEPARIN SODIUM (PORCINE) 5000 UNIT/ML IJ SOLN
5000.0000 [IU] | Freq: Three times a day (TID) | INTRAMUSCULAR | Status: DC
Start: 2021-07-31 — End: 2021-08-05
  Administered 2021-07-31 – 2021-08-05 (×14): 5000 [IU] via SUBCUTANEOUS
  Filled 2021-07-31 (×14): qty 1

## 2021-07-31 MED ORDER — BUPIVACAINE LIPOSOME 1.3 % IJ SUSP
INTRAMUSCULAR | Status: AC
  Filled 2021-07-31: qty 20

## 2021-07-31 MED ORDER — PROPRANOLOL HCL 20 MG PO TABS: 20.0000 mg | ORAL_TABLET | Freq: Two times a day (BID) | ORAL | Status: DC | PRN

## 2021-07-31 MED ORDER — SUGAMMADEX SODIUM 200 MG/2ML IV SOLN
INTRAVENOUS | Status: DC | PRN
  Administered 2021-07-31: 200 mg via INTRAVENOUS

## 2021-07-31 MED ORDER — MORPHINE SULFATE (PF) 2 MG/ML IV SOLN
2.0000 mg | INTRAVENOUS | Status: DC | PRN
  Administered 2021-07-31 – 2021-08-01 (×3): 2 mg via INTRAVENOUS
  Filled 2021-07-31 (×3): qty 1

## 2021-07-31 MED ORDER — PROPOFOL 10 MG/ML IV BOLUS
INTRAVENOUS | Status: AC
  Filled 2021-07-31: qty 20

## 2021-07-31 MED ORDER — SODIUM CHLORIDE 0.9 % IV SOLN
2.0000 g | INTRAVENOUS | Status: AC
  Administered 2021-07-31: 2 g via INTRAVENOUS

## 2021-07-31 MED ORDER — DIPHENHYDRAMINE HCL 12.5 MG/5ML PO ELIX: 12.5000 mg | ORAL_SOLUTION | Freq: Four times a day (QID) | ORAL | Status: DC | PRN

## 2021-07-31 MED ORDER — HYDROMORPHONE HCL 1 MG/ML IJ SOLN
0.5000 mg | INTRAMUSCULAR | Status: DC | PRN
  Filled 2021-07-31: qty 0.5

## 2021-07-31 MED ORDER — BUPIVACAINE LIPOSOME 1.3 % IJ SUSP: 20.0000 mL | Freq: Once | INTRAMUSCULAR | Status: AC

## 2021-07-31 MED ORDER — ONDANSETRON HCL 4 MG/2ML IJ SOLN
4.0000 mg | Freq: Four times a day (QID) | INTRAMUSCULAR | Status: DC | PRN
  Administered 2021-08-01: 4 mg via INTRAVENOUS
  Filled 2021-07-31 (×2): qty 2

## 2021-07-31 MED ORDER — SCOPOLAMINE 1 MG/3DAYS TD PT72
MEDICATED_PATCH | TRANSDERMAL | Status: DC | PRN
  Administered 2021-07-31: 1 via TRANSDERMAL

## 2021-07-31 MED ORDER — CHLORHEXIDINE GLUCONATE 0.12 % MT SOLN
15.0000 mL | Freq: Once | OROMUCOSAL | Status: AC
Start: 2021-07-31 — End: 2021-07-31
  Administered 2021-07-31: 15 mL via OROMUCOSAL

## 2021-07-31 MED ORDER — MIDAZOLAM HCL 2 MG/2ML IJ SOLN
INTRAMUSCULAR | Status: DC | PRN
  Administered 2021-07-31: 2 mg via INTRAVENOUS

## 2021-07-31 MED ORDER — ALUM & MAG HYDROXIDE-SIMETH 200-200-20 MG/5ML PO SUSP: 30.0000 mL | Freq: Four times a day (QID) | ORAL | Status: DC | PRN

## 2021-07-31 MED ORDER — ROCURONIUM BROMIDE 10 MG/ML (PF) SYRINGE
PREFILLED_SYRINGE | INTRAVENOUS | Status: AC
  Filled 2021-07-31: qty 10

## 2021-07-31 MED ORDER — FENTANYL CITRATE (PF) 100 MCG/2ML IJ SOLN
INTRAMUSCULAR | Status: AC
  Filled 2021-07-31: qty 2

## 2021-07-31 MED ORDER — KETOROLAC TROMETHAMINE 30 MG/ML IJ SOLN
30.0000 mg | Freq: Once | INTRAMUSCULAR | Status: AC | PRN
Start: 2021-07-31 — End: 2021-07-31
  Administered 2021-07-31: 30 mg via INTRAVENOUS

## 2021-07-31 MED ORDER — ONDANSETRON HCL 4 MG/2ML IJ SOLN
4.0000 mg | Freq: Once | INTRAMUSCULAR | Status: AC | PRN
  Administered 2021-07-31: 4 mg via INTRAVENOUS

## 2021-07-31 MED ORDER — DULOXETINE HCL 30 MG PO CPEP
30.0000 mg | ORAL_CAPSULE | Freq: Every day | ORAL | Status: DC
  Filled 2021-07-31 (×2): qty 1

## 2021-07-31 MED ORDER — ACETAMINOPHEN 500 MG PO TABS
1000.0000 mg | ORAL_TABLET | Freq: Four times a day (QID) | ORAL | Status: DC
  Administered 2021-07-31: 1000 mg via ORAL
  Filled 2021-07-31: qty 2

## 2021-07-31 MED ORDER — ACETAMINOPHEN 500 MG PO TABS: 1000.0000 mg | ORAL_TABLET | ORAL | Status: AC

## 2021-07-31 MED ORDER — BUPIVACAINE LIPOSOME 1.3 % IJ SUSP
INTRAMUSCULAR | Status: DC | PRN
  Administered 2021-07-31: 20 mL

## 2021-07-31 MED ORDER — MORPHINE SULFATE (PF) 2 MG/ML IV SOLN
INTRAVENOUS | Status: AC
  Filled 2021-07-31: qty 1

## 2021-07-31 MED ORDER — DIPHENHYDRAMINE HCL 50 MG/ML IJ SOLN: 12.5000 mg | Freq: Four times a day (QID) | INTRAMUSCULAR | Status: DC | PRN

## 2021-07-31 MED ORDER — ORAL CARE MOUTH RINSE: 15.0000 mL | Freq: Once | OROMUCOSAL | Status: AC

## 2021-07-31 MED ORDER — HYDROCODONE-ACETAMINOPHEN 5-325 MG PO TABS
1.0000 | ORAL_TABLET | Freq: Four times a day (QID) | ORAL | Status: DC | PRN
  Administered 2021-07-31 – 2021-08-04 (×8): 1 via ORAL
  Filled 2021-07-31 (×9): qty 1

## 2021-07-31 MED ORDER — FENTANYL CITRATE (PF) 100 MCG/2ML IJ SOLN
INTRAMUSCULAR | Status: DC | PRN
  Administered 2021-07-31 (×4): 50 ug via INTRAVENOUS

## 2021-07-31 MED ORDER — ONDANSETRON HCL 4 MG/2ML IJ SOLN
INTRAMUSCULAR | Status: DC | PRN
  Administered 2021-07-31: 4 mg via INTRAVENOUS

## 2021-07-31 MED ORDER — LORATADINE 10 MG PO TABS: 10.0000 mg | ORAL_TABLET | Freq: Every day | ORAL | Status: DC | PRN

## 2021-07-31 MED ORDER — MELATONIN 5 MG PO TABS: 5.0000 mg | ORAL_TABLET | Freq: Every evening | ORAL | Status: DC | PRN

## 2021-07-31 MED ORDER — ALVIMOPAN 12 MG PO CAPS
ORAL_CAPSULE | ORAL | Status: AC
  Administered 2021-07-31: 12 mg via ORAL
  Filled 2021-07-31: qty 1

## 2021-07-31 MED ORDER — TRAMADOL HCL 50 MG PO TABS: 50.0000 mg | ORAL_TABLET | Freq: Four times a day (QID) | ORAL | Status: DC | PRN

## 2021-07-31 SURGICAL SUPPLY — 52 items
APL PRP STRL LF DISP 70% ISPRP (MISCELLANEOUS) ×1
BAG COUNTER SPONGE SURGICOUNT (BAG) IMPLANT
BAG SPNG CNTER NS LX DISP (BAG)
BLADE HEX COATED 2.75 (ELECTRODE) ×2 IMPLANT
CHLORAPREP W/TINT 26 (MISCELLANEOUS) ×2 IMPLANT
COVER MAYO STAND STRL (DRAPES) ×2 IMPLANT
DRAPE LAPAROSCOPIC ABDOMINAL (DRAPES) ×2 IMPLANT
DRAPE UTILITY XL STRL (DRAPES) IMPLANT
DRAPE WARM FLUID 44X44 (DRAPES) ×2 IMPLANT
DRSG OPSITE POSTOP 4X10 (GAUZE/BANDAGES/DRESSINGS) IMPLANT
DRSG OPSITE POSTOP 4X6 (GAUZE/BANDAGES/DRESSINGS) IMPLANT
DRSG OPSITE POSTOP 4X8 (GAUZE/BANDAGES/DRESSINGS) IMPLANT
DRSG TELFA 3X8 NADH (GAUZE/BANDAGES/DRESSINGS) IMPLANT
ELECT REM PT RETURN 15FT ADLT (MISCELLANEOUS) ×2 IMPLANT
GAUZE SPONGE 4X4 12PLY STRL (GAUZE/BANDAGES/DRESSINGS) ×2 IMPLANT
GLOVE BIOGEL PI IND STRL 7.0 (GLOVE) ×1 IMPLANT
GLOVE BIOGEL PI INDICATOR 7.0 (GLOVE) ×1
GLOVE SURG SS PI 7.0 STRL IVOR (GLOVE) ×2 IMPLANT
GOWN STRL REUS W/ TWL XL LVL3 (GOWN DISPOSABLE) IMPLANT
GOWN STRL REUS W/TWL XL LVL3 (GOWN DISPOSABLE)
HANDLE SUCTION POOLE (INSTRUMENTS) ×1 IMPLANT
HOLDER FOLEY CATH W/STRAP (MISCELLANEOUS) IMPLANT
KIT BASIN OR (CUSTOM PROCEDURE TRAY) ×2 IMPLANT
KIT TURNOVER KIT A (KITS) IMPLANT
MANIFOLD NEPTUNE II (INSTRUMENTS) ×2 IMPLANT
PACK GENERAL/GYN (CUSTOM PROCEDURE TRAY) ×2 IMPLANT
PAD DRESSING TELFA 3X8 NADH (GAUZE/BANDAGES/DRESSINGS) IMPLANT
RELOAD PROXIMATE 75MM BLUE (ENDOMECHANICALS) ×4 IMPLANT
RELOAD STAPLE 75 3.8 BLU REG (ENDOMECHANICALS) IMPLANT
STAPLER 90 3.5 STAND SLIM (STAPLE) ×2
STAPLER 90 3.5 STD SLIM (STAPLE) IMPLANT
STAPLER GUN LINEAR PROX 60 (STAPLE) IMPLANT
STAPLER PROXIMATE 75MM BLUE (STAPLE) ×1 IMPLANT
SUCTION POOLE HANDLE (INSTRUMENTS) ×2
SUT NOVA NAB DX-16 0-1 5-0 T12 (SUTURE) IMPLANT
SUT NOVA NAB GS-21 0 18 T12 DT (SUTURE) ×4 IMPLANT
SUT PROLENE 2 0 BLUE (SUTURE) IMPLANT
SUT SILK 2 0 (SUTURE) ×2
SUT SILK 2 0 SH CR/8 (SUTURE) ×2 IMPLANT
SUT SILK 2-0 18XBRD TIE 12 (SUTURE) ×1 IMPLANT
SUT SILK 3 0 (SUTURE) ×2
SUT SILK 3 0 SH CR/8 (SUTURE) ×2 IMPLANT
SUT SILK 3-0 18XBRD TIE 12 (SUTURE) ×1 IMPLANT
SUT VIC AB 2-0 SH 18 (SUTURE) IMPLANT
SUT VIC AB 2-0 SH 27 (SUTURE) ×4
SUT VIC AB 2-0 SH 27X BRD (SUTURE) ×2 IMPLANT
SUT VIC AB 2-0 UR6 27 (SUTURE) ×1 IMPLANT
SUT VIC AB 4-0 PS2 18 (SUTURE) ×2 IMPLANT
TAPE CLOTH SURG 6X10 WHT LF (GAUZE/BANDAGES/DRESSINGS) ×1 IMPLANT
TOWEL OR 17X26 10 PK STRL BLUE (TOWEL DISPOSABLE) ×4 IMPLANT
TOWEL OR NON WOVEN STRL DISP B (DISPOSABLE) ×4 IMPLANT
YANKAUER SUCT BULB TIP NO VENT (SUCTIONS) ×2 IMPLANT

## 2021-07-31 NOTE — Anesthesia Procedure Notes (Signed)
Procedure Name: Intubation Date/Time: 07/31/2021 10:58 AM  Performed by: Pearson Grippe, CRNAPre-anesthesia Checklist: Patient identified, Emergency Drugs available, Suction available and Patient being monitored Patient Re-evaluated:Patient Re-evaluated prior to induction Oxygen Delivery Method: Circle system utilized Preoxygenation: Pre-oxygenation with 100% oxygen Induction Type: IV induction Ventilation: Mask ventilation without difficulty Laryngoscope Size: Miller and 2 Grade View: Grade I Tube type: Oral Number of attempts: 1 Airway Equipment and Method: Stylet and Oral airway Placement Confirmation: ETT inserted through vocal cords under direct vision, positive ETCO2 and breath sounds checked- equal and bilateral Secured at: 22 cm Tube secured with: Tape Dental Injury: Teeth and Oropharynx as per pre-operative assessment

## 2021-08-01 ENCOUNTER — Inpatient Hospital Stay: Payer: 59

## 2021-08-01 ENCOUNTER — Ambulatory Visit (HOSPITAL_BASED_OUTPATIENT_CLINIC_OR_DEPARTMENT_OTHER): Payer: 59

## 2021-08-01 ENCOUNTER — Encounter (HOSPITAL_COMMUNITY): Payer: Self-pay | Admitting: Surgery

## 2021-08-01 LAB — BASIC METABOLIC PANEL
Anion gap: 8 (ref 5–15)
BUN: 6 mg/dL (ref 6–20)
CO2: 26 mmol/L (ref 22–32)
Calcium: 9 mg/dL (ref 8.9–10.3)
Chloride: 107 mmol/L (ref 98–111)
Creatinine, Ser: 0.6 mg/dL (ref 0.44–1.00)
GFR, Estimated: >60 mL/min (ref >60)
Glucose, Bld: 102 mg/dL — ABNORMAL HIGH (ref 70–99)
Potassium: 3.7 mmol/L (ref 3.5–5.1)
Sodium: 141 mmol/L (ref 135–145)

## 2021-08-01 LAB — CBC
HCT: 31.6 % — ABNORMAL LOW (ref 36.0–46.0)
Hemoglobin: 10 g/dL — ABNORMAL LOW (ref 12.0–15.0)
MCH: 26.2 pg (ref 26.0–34.0)
MCHC: 31.6 g/dL (ref 30.0–36.0)
MCV: 82.7 fL (ref 80.0–100.0)
Platelets: 243 10*3/uL (ref 150–400)
RBC: 3.82 MIL/uL — ABNORMAL LOW (ref 3.87–5.11)
RDW: 14.5 % (ref 11.5–15.5)
WBC: 7.9 10*3/uL (ref 4.0–10.5)
nRBC: 0 % (ref 0.0–0.2)

## 2021-08-01 LAB — SURGICAL PATHOLOGY

## 2021-08-01 NOTE — Progress Notes (Signed)
  Subjective No acute events. Sore yesterday, better today. No nausea nor vomiting. Tolerating liquids well.  Objective: Vital signs in last 24 hours: Temp:  [97.4 F (36.3 C)-99.2 F (37.3 C)] 98.3 F (36.8 C) (06/27 0601) Pulse Rate:  [67-97] 67 (06/27 0601) Resp:  [14-21] 18 (06/27 0601) BP: (109-147)/(77-96) 109/77 (06/27 0601) SpO2:  [99 %-100 %] 99 % (06/27 0601) Weight:  [69.9 kg-75.4 kg] 75.4 kg (06/27 0500) Last BM Date : 07/31/21  Intake/Output from previous day: 06/26 0701 - 06/27 0700 In: 2180.7 [P.O.:480; I.V.:1600.7; IV Piggyback:100] Out: 2020 [Urine:2000; Blood:20] Intake/Output this shift: Total I/O In: -  Out: 400 [Urine:400]  Gen: NAD, comfortable CV: RRR Pulm: Normal work of breathing Abd: Soft, appropriate incisional soreness, nondistended. Ext: SCDs in place  Lab Results: CBC  Recent Labs    08/01/21 0436  WBC 7.9  HGB 10.0*  HCT 31.6*  PLT 243   BMET Recent Labs    08/01/21 0436  NA 141  K 3.7  CL 107  CO2 26  GLUCOSE 102*  BUN 6  CREATININE 0.60  CALCIUM 9.0   PT/INR No results for input(s): "LABPROT", "INR" in the last 72 hours. ABG No results for input(s): "PHART", "HCO3" in the last 72 hours.  Invalid input(s): "PCO2", "PO2"  Studies/Results:  Anti-infectives: Anti-infectives (From admission, onward)    Start     Dose/Rate Route Frequency Ordered Stop   07/31/21 0900  cefoTEtan (CEFOTAN) 2 g in sodium chloride 0.9 % 100 mL IVPB        2 g 200 mL/hr over 30 Minutes Intravenous On call to O.R. 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 Went over general expectations as well  LOS: 1 day   Marin Olp, MD [facility], [facility]

## 2021-08-01 NOTE — Progress Notes (Signed)
  Transition of Care [facility]) Screening Note   Patient Details  Name: Sharon Bennett Date of Birth: May 01, 1980   Transition of Care [facility]) CM/SW Contact:    Amada Jupiter, LCSW Phone Number: 08/01/2021, 10:32 AM    Transition of Care Department [facility]) has reviewed patient and no TOC needs have been identified at this time. We will continue to monitor patient advancement through interdisciplinary progression rounds. If new patient transition needs arise, please place a TOC consult.

## 2021-08-02 ENCOUNTER — Other Ambulatory Visit (HOSPITAL_COMMUNITY): Payer: Self-pay

## 2021-08-02 LAB — CBC
HCT: 32.2 % — ABNORMAL LOW (ref 36.0–46.0)
Hemoglobin: 9.8 g/dL — ABNORMAL LOW (ref 12.0–15.0)
MCH: 25.9 pg — ABNORMAL LOW (ref 26.0–34.0)
MCHC: 30.4 g/dL (ref 30.0–36.0)
MCV: 85 fL (ref 80.0–100.0)
Platelets: 240 10*3/uL (ref 150–400)
RBC: 3.79 MIL/uL — ABNORMAL LOW (ref 3.87–5.11)
RDW: 14.8 % (ref 11.5–15.5)
WBC: 6.3 10*3/uL (ref 4.0–10.5)
nRBC: 0 % (ref 0.0–0.2)

## 2021-08-02 LAB — BASIC METABOLIC PANEL
Anion gap: 6 (ref 5–15)
BUN: 7 mg/dL (ref 6–20)
CO2: 27 mmol/L (ref 22–32)
Calcium: 8.4 mg/dL — ABNORMAL LOW (ref 8.9–10.3)
Chloride: 107 mmol/L (ref 98–111)
Creatinine, Ser: 0.59 mg/dL (ref 0.44–1.00)
GFR, Estimated: >60 mL/min (ref >60)
Glucose, Bld: 90 mg/dL (ref 70–99)
Potassium: 3.4 mmol/L — ABNORMAL LOW (ref 3.5–5.1)
Sodium: 140 mmol/L (ref 135–145)

## 2021-08-02 MED ORDER — HYDROCODONE-ACETAMINOPHEN 5-325 MG PO TABS
1.0000 | ORAL_TABLET | Freq: Four times a day (QID) | ORAL | 0 refills | Status: DC | PRN
  Filled 2021-08-03: qty 20, 5d supply, fill #0

## 2021-08-02 NOTE — Plan of Care (Signed)

## 2021-08-02 NOTE — Discharge Instructions (Addendum)
POST OP INSTRUCTIONS AFTER COLON SURGERY  DIET: Be sure to include lots of fluids daily to stay hydrated - 64oz of water per day (8, 8 oz glasses).  Avoid fast food or heavy meals for the first couple of weeks as your are more likely to get nauseated. Avoid raw/uncooked fruits or vegetables for the first 4 weeks (its ok to have these if they are blended into smoothie form). If you have fruits/vegetables, make sure they are cooked until soft enough to mash on the roof of your mouth and chew your food well. Otherwise, diet as tolerated.  Take your usually prescribed home medications unless otherwise directed.  PAIN CONTROL: Pain is best controlled by a usual combination of three different methods TOGETHER: Ice/Heat Over the counter pain medication Prescription pain medication Most patients will experience some swelling and bruising around the surgical site.  Ice packs or heating pads (30-60 minutes up to 6 times a day) will help. Some people prefer to use ice alone, heat alone, alternating between ice & heat.  Experiment to what works for you.  Swelling and bruising can take several weeks to resolve.   It is helpful to take an over-the-counter pain medication regularly for the first few weeks: Ibuprofen (Motrin/Advil) - '200mg'$  tabs - take 3 tabs ('600mg'$ ) every 6 hours as needed for pain (unless you have been directed previously to avoid NSAIDs/ibuprofen) Acetaminophen (Tylenol) - you may take '650mg'$  every 6 hours as needed. You can take this with motrin as they act differently on the body. If you are taking a narcotic pain medication that has acetaminophen in it, do not take over the counter tylenol at the same time. NOTE: You may take both of these medications together - most patients  find it most helpful when alternating between the two (i.e. Ibuprofen at 6am, tylenol at 9am, ibuprofen at 12pm ..Marland Kitchen) A  prescription for pain medication should be given to you upon discharge.  Take your pain medication as  prescribed if your pain is not adequatly controlled with the over-the-counter pain reliefs mentioned above.  Avoid getting constipated.  Between the surgery and the pain medications, it is common to experience some constipation.  Increasing fluid intake and taking a fiber supplement (such as Metamucil, Citrucel, FiberCon, MiraLax, etc) 1-2 times a day regularly will usually help prevent this problem from occurring.  A mild laxative (prune juice, Milk of Magnesia, MiraLax, etc) should be taken according to package directions if there are no bowel movements after 48 hours.    Dressing: Cover your prior ileostomy site with dry gauze and change at least once per day. Ok to bathe in shower and get soap/water in the wound - this actually helps keep it clean.  ACTIVITIES as tolerated:   Avoid heavy lifting (>10lbs or 1 gallon of milk) for the next 6 weeks. You may resume regular daily activities as tolerated--such as daily self-care, walking, climbing stairs--gradually increasing activities as tolerated.  If you can walk 30 minutes without difficulty, it is safe to try more intense activity such as jogging, treadmill, bicycling, low-impact aerobics.  DO NOT PUSH THROUGH PAIN.  Let pain be your guide: If it hurts to do something, don't do it. You may drive when you are no longer taking prescription pain medication, you can comfortably wear a seatbelt, and you can safely maneuver your car and apply brakes.  FOLLOW UP in our office Please call [facility] at [phone] to set up an appointment to see your surgeon in  the office for a follow-up appointment approximately 2 weeks after your surgery. Make sure that you call for this appointment the day you arrive home to insure a convenient appointment time.  9. If you have disability or family leave forms that need to be completed, you may have them completed by your primary care physician's office; for return to work instructions, please ask our office staff and they  will be happy to assist you in obtaining this documentation   When to call us [phone]: Poor pain control Reactions / problems with new medications (rash/itching, etc)  Fever over 101.5 F (38.5 C) Inability to urinate Nausea/vomiting Worsening swelling or bruising Continued bleeding from incision. Increased pain, redness, or drainage from the incision  The clinic staff is available to answer your questions during regular business hours (8:30am-5pm).  Please don't hesitate to call and ask to speak to one of our nurses for clinical concerns.   A surgeon from [facility] is always on call at the hospitals   If you have a medical emergency, go to the nearest emergency room or call 911.  [facility], [place] [address], [place], [place], [place]  [address] MAIN: [phone] FAX: [phone] [email or web address]  PER MEDICAL ONCOLOGY:    CT scan chest/abd/pelvis at [facility] on 08/25/21 at 1030 with 1015 arrival. NPO 4 hours prior. Drink oral contrast at 0830 and 0930.    Office will call to schedule follow up with [name] for 08/28/21

## 2021-08-02 NOTE — Progress Notes (Signed)
  Subjective No acute events. No nausea nor vomiting. Tolerating diet. Ambulating. No flatus/BM yet  Objective: Vital signs in last 24 hours: Temp:  [97.8 F (36.6 C)-98.6 F (37 C)] 98.6 F (37 C) (06/28 0547) Pulse Rate:  [69-85] 85 (06/28 0547) Resp:  [16-18] 18 (06/28 0547) BP: (113-135)/(78-90) 114/83 (06/28 0547) SpO2:  [99 %-100 %] 100 % (06/28 0547) Last BM Date : 07/31/21  Intake/Output from previous day: 06/27 0701 - 06/28 0700 In: 840 [P.O.:840] Out: 3000 [Urine:3000] Intake/Output this shift: No intake/output data recorded.  Gen: NAD, comfortable CV: RRR Pulm: Normal work of breathing Abd: Soft, appropriate incisional soreness, nondistended. Ext: SCDs in place  Lab Results: CBC  Recent Labs    08/01/21 0436 08/02/21 0446  WBC 7.9 6.3  HGB 10.0* 9.8*  HCT 31.6* 32.2*  PLT 243 240   BMET Recent Labs    08/01/21 0436 08/02/21 0446  NA 141 140  K 3.7 3.4*  CL 107 107  CO2 26 27  GLUCOSE 102* 90  BUN 6 7  CREATININE 0.60 0.59  CALCIUM 9.0 8.4*   PT/INR No results for input(s): "LABPROT", "INR" in the last 72 hours. ABG No results for input(s): "PHART", "HCO3" in the last 72 hours.  Invalid input(s): "PCO2", "PO2"  Studies/Results:  Anti-infectives: Anti-infectives (From admission, onward)    Start     Dose/Rate Route Frequency Ordered Stop   07/31/21 0900  cefoTEtan (CEFOTAN) 2 g in sodium chloride 0.9 % 100 mL IVPB        2 g 200 mL/hr over 30 Minutes Intravenous On call to O.R. 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

## 2021-08-03 ENCOUNTER — Inpatient Hospital Stay: Payer: 59 | Admitting: Nurse Practitioner

## 2021-08-03 ENCOUNTER — Other Ambulatory Visit (HOSPITAL_COMMUNITY): Payer: Self-pay

## 2021-08-03 NOTE — Progress Notes (Signed)
  Subjective No acute events. Some nausea this morning. Passing flatus. Tolerating diet. Ambulating.  Objective: Vital signs in last 24 hours: Temp:  [98.5 F (36.9 C)-98.6 F (37 C)] 98.5 F (36.9 C) (06/29 0553) Pulse Rate:  [85-93] 93 (06/29 0553) Resp:  [16-18] 16 (06/29 0553) BP: (123-140)/(89-93) 140/89 (06/29 0553) SpO2:  [99 %-100 %] 99 % (06/29 0553) Last BM Date : 07/31/21  Intake/Output from previous day: 06/28 0701 - 06/29 0700 In: 1320 [P.O.:1320] Out: 2000 [Urine:2000] Intake/Output this shift: No intake/output data recorded.  Gen: NAD, comfortable CV: RRR Pulm: Normal work of breathing Abd: Soft, appropriate incisional soreness, nondistended. Ext: SCDs in place  Lab Results: CBC  Recent Labs    08/01/21 0436 08/02/21 0446  WBC 7.9 6.3  HGB 10.0* 9.8*  HCT 31.6* 32.2*  PLT 243 240   BMET Recent Labs    08/01/21 0436 08/02/21 0446  NA 141 140  K 3.7 3.4*  CL 107 107  CO2 26 27  GLUCOSE 102* 90  BUN 6 7  CREATININE 0.60 0.59  CALCIUM 9.0 8.4*   PT/INR No results for input(s): "LABPROT", "INR" in the last 72 hours. ABG No results for input(s): "PHART", "HCO3" in the last 72 hours.  Invalid input(s): "PCO2", "PO2"  Studies/Results:  Anti-infectives: Anti-infectives (From admission, onward)    Start     Dose/Rate Route Frequency Ordered Stop   07/31/21 0900  cefoTEtan (CEFOTAN) 2 g in sodium chloride 0.9 % 100 mL IVPB        2 g 200 mL/hr over 30 Minutes Intravenous On call to O.R. 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

## 2021-08-04 ENCOUNTER — Other Ambulatory Visit (HOSPITAL_COMMUNITY): Payer: Self-pay

## 2021-08-04 LAB — C DIFFICILE (CDIFF) QUICK SCRN (NO PCR REFLEX)
C Diff antigen: NEGATIVE
C Diff interpretation: NOT DETECTED
C Diff toxin: NEGATIVE

## 2021-08-04 LAB — BASIC METABOLIC PANEL
Anion gap: 9 (ref 5–15)
BUN: <5 mg/dL — ABNORMAL LOW (ref 6–20)
CO2: 27 mmol/L (ref 22–32)
Calcium: 9 mg/dL (ref 8.9–10.3)
Chloride: 103 mmol/L (ref 98–111)
Creatinine, Ser: 0.51 mg/dL (ref 0.44–1.00)
GFR, Estimated: >60 mL/min (ref >60)
Glucose, Bld: 94 mg/dL (ref 70–99)
Potassium: 3.3 mmol/L — ABNORMAL LOW (ref 3.5–5.1)
Sodium: 139 mmol/L (ref 135–145)

## 2021-08-04 MED ORDER — POTASSIUM CHLORIDE 10 MEQ/100ML IV SOLN
10.0000 meq | INTRAVENOUS | Status: DC
  Filled 2021-08-04: qty 100

## 2021-08-04 MED ORDER — POTASSIUM CHLORIDE CRYS ER 20 MEQ PO TBCR
40.0000 meq | EXTENDED_RELEASE_TABLET | Freq: Once | ORAL | Status: AC
Start: 2021-08-04 — End: 2021-08-04
  Administered 2021-08-04: 40 meq via ORAL
  Filled 2021-08-04: qty 2

## 2021-08-04 NOTE — Discharge Planning (Signed)
Oncology Discharge Planning Note  [facility] Address: [address] [place], [place], [place] [address] Hours of Operation:  Nena Polio, Monday - Friday  Clinic Contact Information:  [phone]  Oncology Care Team: Medical Oncologist:  Dr. [name]  Patient Details: Name:  Sharon Bennett, Sharon Bennett MRN:   [id number] DOB:   1981-01-15 Reason for Current Admission: S/P closure of ileostomy  Discharge Planning Narrative: Notification of admission received by Sharon Bennett for Sharon Bennett.  Discharge follow-up appointments for oncology are current and available on the AVS and MyChart.   Upon discharge from the hospital, hematology/oncology's post discharge plan of care for the outpatient setting is: CT scan on 08/25/21 and f/u with [name] on 08/28/21.   Debar Plate will be called within two business days after discharge to review hematology/oncology's plan of care for full understanding.    Outpatient Oncology Specific Care Only: Oncology appointment transportation needs addressed?:  yes Oncology medication management for symptom management addressed?:  not applicable Chemo Alert Card reviewed?:  not applicable Immunotherapy Alert Card reviewed?:  not applicable

## 2021-08-04 NOTE — Progress Notes (Signed)
  Subjective Has recorded 13 Bms in last 24 hrs; she denies any emesis. Tolerating diet without nausea.   Objective: Vital signs in last 24 hours: Temp:  [98.4 F (36.9 C)-99.3 F (37.4 C)] 98.5 F (36.9 C) (06/30 0609) Pulse Rate:  [90-93] 91 (06/30 0609) Resp:  [16-18] 18 (06/30 0609) BP: (111-126)/(79-99) 111/82 (06/30 0609) SpO2:  [100 %] 100 % (06/30 0609) Last BM Date : 08/03/21  Intake/Output from previous day: 06/29 0701 - 06/30 0700 In: 1680 [P.O.:1680] Out: 0  Intake/Output this shift: No intake/output data recorded.  Gen: NAD, comfortable CV: RRR Pulm: Normal work of breathing Abd: Soft, appropriate incisional soreness, nondistended. Ext: SCDs in place  Lab Results: CBC  Recent Labs    08/02/21 0446  WBC 6.3  HGB 9.8*  HCT 32.2*  PLT 240   BMET Recent Labs    08/02/21 0446  NA 140  K 3.4*  CL 107  CO2 27  GLUCOSE 90  BUN 7  CREATININE 0.59  CALCIUM 8.4*   PT/INR No results for input(s): "LABPROT", "INR" in the last 72 hours. ABG No results for input(s): "PHART", "HCO3" in the last 72 hours.  Invalid input(s): "PCO2", "PO2"  Studies/Results:  Anti-infectives: Anti-infectives (From admission, onward)    Start     Dose/Rate Route Frequency Ordered Stop   07/31/21 0900  cefoTEtan (CEFOTAN) 2 g in sodium chloride 0.9 % 100 mL IVPB        2 g 200 mL/hr over 30 Minutes Intravenous On call to O.R. 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 If this is negative, will start Imodium.  -Continue diet as tolerated -BMP -Ambulate 5x/day -D/C Entereg -Abdominal binder prn -Ppx: SQH, SCDs   LOS: 4 days   Nadeen Landau, MD [facility] Surgery, [place]

## 2021-08-04 NOTE — Progress Notes (Signed)
VAST consult received to obtain IV access for potassium runs. Spoke with patient's nurse who stated physician canceled potassium by IV and pt no longer needs IV. Advised if circumstance change to place IVT consult.

## 2021-08-05 NOTE — Progress Notes (Signed)
Assessment unchanged. Pt verbalized understanding of dc instructions through teach back including medications and follow up care. Changed dressing to surgical site prior to dc by herself. Discharged via wc to front entrance accompanied by NT and sister.

## 2021-08-05 NOTE — Plan of Care (Signed)

## 2021-08-05 NOTE — Discharge Summary (Signed)
Physician Discharge Summary    Patient ID: Sharon Bennett MRN: [id number] DOB/AGE: October 28, 1980  41 y.o.  Patient Care Team: de [place], Blondell Reveal, MD as PCP - General (Family Medicine) Skeet Latch, MD as PCP - Cardiology (Cardiology) Algie Coffer Mindi Slicker, RN as Oncology Nurse Navigator  Admit date: 07/31/2021  Discharge date: 08/05/2021  Hospital Stay = 5 days    Discharge Diagnoses:  Principal Problem:   S/P closure of ileostomy   5 Days Post-Op  07/31/2021  PRE-OPERATIVE DIAGNOSIS:  Ileostomy status   POST-OPERATIVE DIAGNOSIS:  Same   PROCEDURE:  Takedown of diverting loop ileostomy   SURGEON:  Sharon [place], MD  FINDINGS: Ileostomy in right lower quadrant with expected peristomal adhesions.  Ileostomy was taken down and an enteroenterostomy created.  Consults: Case Management / [place] Hospital Course:   Pleasant woman status post excision of rectal cancer with diverting loop ileostomy.  Recovered and stabilized.  Felt appropriate for ileostomy takedown.  The patient underwent the surgery above.  Postoperatively, the patient gradually mobilized and advanced to a solid diet.  Pain and other symptoms were treated aggressively.   Patient did have an episode of significant diarrhea.  C. difficile was negative.  Diet advanced and bowel regimen adjusted.  Patient improved.  By the time of discharge, the patient was walking well the hallways, eating food, having flatus.  Pain was well-controlled on an oral medications.  Based on meeting discharge criteria and continuing to recover, I felt it was safe for the patient to be discharged from the hospital to further recover with close followup. Postoperative recommendations were discussed in detail.  They are written as well.  Discharged Condition: good  Discharge Exam: Blood pressure 117/82, pulse (!) 101, temperature 98.9 F (37.2 C), temperature source Oral, resp. rate 18, height '5\' 6"'$  (1.676 m), weight 69.3 kg,  SpO2 99 %.  General: Pt awake/alert/oriented x4 in No acute distress Eyes: PERRL, normal EOM.  Sclera clear.  No icterus Neuro: CN II-XII intact w/o focal sensory/motor deficits. Lymph: No head/neck/groin lymphadenopathy Psych:  No delerium/psychosis/paranoia HENT: Normocephalic, Mucus membranes moist.  No thrush Neck: Supple, No tracheal deviation Chest:  No chest wall pain w good excursion CV:  Pulses intact.  Regular rhythm MS: Normal AROM mjr joints.  No obvious deformity Abdomen: Soft.  Nondistended.  Mildly tender at incisions only.  No evidence of peritonitis.  No incarcerated hernias. Ext:  SCDs BLE.  No mjr edema.  No cyanosis Skin: No petechiae / purpura   Disposition:    Follow-up Information     Ileana Roup, MD. Schedule an appointment as soon as possible for a visit in 3 week(s).   Specialties: General Surgery, Colon and Rectal Surgery Contact information: [address] [place] [address] [phone]                 Discharge disposition: 01-Home or Self Care       Discharge Instructions     Call MD for:   Complete by: As directed    FEVER > 101.5 F (Temperatures <101.10F can occasionally happen and are not significant)   Call MD for:  extreme fatigue   Complete by: As directed    Call MD for:  persistant dizziness or light-headedness   Complete by: As directed    Call MD for:  persistant nausea and vomiting   Complete by: As directed    Call MD for:  redness, tenderness, or signs of infection (  pain, swelling, redness, odor or green/yellow discharge around incision site)   Complete by: As directed    Call MD for:  severe uncontrolled pain   Complete by: As directed    Diet - low sodium heart healthy   Complete by: As directed    Follow a light diet the first few days at home.    If you feel full, bloated, or constipated, stay on a liquid diet until you feel better and not constipated. Gradually get back to  a solid diet.  Avoid fast food or heavy meals the first week as you are more likely to get nauseated. It is expected for your digestive tract to need a few months to get back to normal.   Discharge wound care:   Complete by: As directed    You have an open wound.   In general, it is encouraged that you remove your dressing and packing, shower with soap & water, and replace your dressing once a day.   Pack the wound with clean gauze moistened with normal (0.9%) saline or KY gel to keep the wound moist & uninfected.    .   Eventually your body will heal & pull the open wound closed over the next few months.  Raw open wounds will occasionally bleed or secrete yellow drainage until it heals closed.   Pressure on the dressing for 30 minutes will stop most wound bleeding Drain sites will drain a little until the drain is removed.   Driving Restrictions   Complete by: As directed    You may drive when you are no longer taking narcotic prescription pain medication, you can comfortably wear a seatbelt, and you can safely make sudden turns/stops to protect yourself without hesitating due to pain.   Increase activity slowly   Complete by: As directed    Lifting restrictions   Complete by: As directed    Start light daily activities --- self-care, walking, climbing stairs- beginning the day after surgery.   Gradually increase activities as tolerated.   Control your pain to be active.   Stop when you are tired.   Ideally, walk several times a day, eventually an hour a day.   Most people are back to most day-to-day activities in a few weeks.  It takes 4-8 weeks to get back to unrestricted, intense activity. If you can walk 30 minutes without difficulty, it is safe to try more intense activity such as jogging, treadmill, bicycling, low-impact aerobics, swimming, etc. Save the most intensive and strenuous activity for last (Usually 4-8 weeks after surgery) such as sit-ups, heavy lifting, contact sports,  etc.   Refrain from any intense heavy lifting or straining until you are off narcotics for pain control.  You will have off days, but things should improve week-by-week. DO NOT PUSH THROUGH PAIN.   Let pain be your guide: If it hurts to do something, don't do it.  Pain is your body warning you to avoid that activity for another week until the pain goes down.   May shower / Bathe   Complete by: As directed    May walk up steps   Complete by: As directed    Sexual Activity Restrictions   Complete by: As directed    You may have sexual intercourse when it is comfortable. If it hurts to do something, stop.       Allergies as of 08/05/2021       Reactions   Codeine Nausea And Vomiting  Dilaudid [hydromorphone] Nausea And Vomiting   Mushroom Extract Complex Hives   Oxycodone Nausea And Vomiting   Tegaderm Ag Mesh [silver] Rash   Allergic reaction to Tegaderm dressing        Medication List     TAKE these medications    DULoxetine 30 MG capsule Commonly known as: CYMBALTA Take 1 capsule (30 mg total) by mouth daily.   fexofenadine 180 MG tablet Commonly known as: ALLEGRA Take 180 mg by mouth daily as needed for allergies or rhinitis.   HYDROcodone-acetaminophen 5-325 MG tablet Commonly known as: NORCO/VICODIN Take 1 tablet by mouth every 6 (six) hours as needed for severe pain (not controlled with tylenol and ibuprofen first).   loperamide 2 MG capsule Commonly known as: IMODIUM Take 1 capsule (2 mg total) by mouth 2 (two) times daily as needed for diarrhea or loose stools.   melatonin 5 MG Tabs Take 5 mg by mouth at bedtime as needed (sleep).   ondansetron 8 MG tablet Commonly known as: ZOFRAN Take 1 tablet (8 mg total) by mouth every 8 (eight) hours as needed for nausea or vomiting.   pantoprazole 40 MG tablet Commonly known as: PROTONIX Take 1 tablet (40 mg total) by mouth daily.   propranolol 20 MG tablet Commonly known as: INDERAL Take 1 tablet (20 mg total)  by mouth 2 (two) times daily as needed (heart rate at rest >120bpm, palpitations).   Slynd 4 MG Tabs Generic drug: Drospirenone Take 1 tablet by mouth daily continuously               Discharge Care Instructions  (From admission, onward)           Start     Ordered   08/05/21 0000  Discharge wound care:       Comments: You have an open wound.   In general, it is encouraged that you remove your dressing and packing, shower with soap & water, and replace your dressing once a day.   Pack the wound with clean gauze moistened with normal (0.9%) saline or KY gel to keep the wound moist & uninfected.    .   Eventually your body will heal & pull the open wound closed over the next few months.  Raw open wounds will occasionally bleed or secrete yellow drainage until it heals closed.   Pressure on the dressing for 30 minutes will stop most wound bleeding Drain sites will drain a little until the drain is removed.   08/05/21 0812            Significant Diagnostic Studies:  Results for orders placed or performed during the hospital encounter of 07/31/21 (from the past 72 hour(s))  Basic metabolic panel     Status: Abnormal   Collection Time: 08/04/21 10:12 AM  Result Value Ref Range   Sodium 139 135 - 145 mmol/L   Potassium 3.3 (L) 3.5 - 5.1 mmol/L   Chloride 103 98 - 111 mmol/L   CO2 27 22 - 32 mmol/L   Glucose, Bld 94 70 - 99 mg/dL    Comment: Glucose reference range applies only to samples taken after fasting for at least 8 hours.   BUN <5 (L) 6 - 20 mg/dL   Creatinine, Ser 0.51 0.44 - 1.00 mg/dL   Calcium 9.0 8.9 - 10.3 mg/dL   GFR, Estimated >60 >60 mL/min    Comment: (NOTE) Calculated using the CKD-EPI Creatinine Equation (2021)    Anion gap 9 5 - 15  Comment: Performed at [facility], [place] [address] [place], [place] [address]  C Difficile Quick Screen (NO PCR Reflex)     Status: None   Collection Time: 08/04/21  6:17 PM   Specimen:  STOOL  Result Value Ref Range   C Diff antigen NEGATIVE NEGATIVE   C Diff toxin NEGATIVE NEGATIVE   C Diff interpretation No C. difficile detected.     Comment: Performed at [facility], [place] [address], [place], [place] [address]    No results found.  Past Medical History:  Diagnosis Date   Anxiety    Bursitis    in hips per pt   Colorectal cancer ([place])    Depression    Elevated blood pressure reading 05/22/2021   Family history of brain cancer    Family history of breast cancer    Family history of pancreatic cancer    GERD (gastroesophageal reflux disease)    PONV (postoperative nausea and vomiting)    Tachycardia 05/22/2021    Past Surgical History:  Procedure Laterality Date   FLEXIBLE SIGMOIDOSCOPY N/A 05/10/2021   Procedure: FLEXIBLE SIGMOIDOSCOPY;  Surgeon: Ileana Roup, MD;  Location: [facility];  Service: General;  Laterality: N/A;   FLEXIBLE SIGMOIDOSCOPY N/A 07/19/2021   Procedure: Beryle Quant;  Surgeon: Ileana Roup, MD;  Location: [facility];  Service: General;  Laterality: N/A;   ILEOSTOMY CLOSURE N/A 07/31/2021   Procedure: OPEN ILEOSTOMY TAKEDOWN;  Surgeon: Ileana Roup, MD;  Location: [facility];  Service: General;  Laterality: N/A;   IR IMAGING GUIDED PORT INSERTION  09/09/2020   IR REMOVAL TUN ACCESS W/ PORT W/O FL MOD SED  04/04/2021   LEEP     2015   POSTERIOR CERVICAL LAMINECTOMY N/A 04/19/2021   Procedure: CERVICAL LAMINECTOMY FOR EPIDURAL [place];  Surgeon: Earnie Larsson, MD;  Location: [facility];  Service: Neurosurgery;  Laterality: N/A;   XI ROBOTIC ASSISTED LOWER ANTERIOR RESECTION N/A 05/10/2021   Procedure: XI ROBOTIC ASSISTED LOWER ANTERIOR RESECTION WITH DIVERTING LOOP ILEOSTOMY, INTRAOPERATIVE PERFUSION ASSESSMENT WITH FIREFLY INJECTION;  Surgeon: Ileana Roup, MD;  Location: [facility];  Service: General;  Laterality: N/A;    Social History   Socioeconomic History   Marital status: Single     Spouse name: Not on file   Number of children: Not on file   Years of education: Not on file   Highest education level: Not on file  Occupational History   Not on file  Tobacco Use   Smoking status: Never   Smokeless tobacco: Never  Vaping Use   Vaping Use: Never used  Substance and Sexual Activity   Alcohol use: Not Currently   Drug use: Never   Sexual activity: Not Currently    Birth control/protection: Pill  Other Topics Concern   Not on file  Social History Narrative   Not on file   Social Determinants of Health   Financial Resource Strain: Low Risk  (05/22/2021)   Overall Financial Resource Strain (CARDIA)    Difficulty of Paying Living Expenses: Not hard at all  Food Insecurity: No Food Insecurity (05/22/2021)   Hunger Vital Sign    Worried About Running Out of Food in the Last Year: Never true    Ran Out of Food in the Last Year: Never true  Transportation Needs: No Transportation Needs (05/22/2021)   PRAPARE - Hydrologist (Medical): No    Lack of Transportation (Non-Medical): No  Physical Activity:  Inactive (05/22/2021)   Exercise Vital Sign    Days of Exercise per Week: 0 days    Minutes of Exercise per Session: 0 min  Stress: Not on file  Social Connections: Not on file  Intimate Partner Violence: Not At Risk (01/03/2021)   Humiliation, Afraid, Rape, and Kick questionnaire    Fear of Current or Ex-Partner: No    Emotionally Abused: No    Physically Abused: No    Sexually Abused: No    Family History  Problem Relation Age of Onset   Hypertension Mother    Hypertension Father    Stroke Maternal Aunt    Heart attack Paternal Uncle    Heart failure Paternal Uncle    Pancreatic cancer Maternal Grandfather        dx 52s   Prostate cancer Maternal Grandfather    Brain cancer Paternal Grandmother        dx >50   Breast cancer Other 52       MGF's sister   Endometrial cancer Neg Hx    Ovarian cancer Neg Hx     Current  Facility-Administered Medications  Medication Dose Route Frequency Provider Last Rate Last Admin   acetaminophen (TYLENOL) tablet 650 mg  650 mg Oral Q6H Ileana Roup, MD   650 mg at 08/05/21 0631   alum & mag hydroxide-simeth (MAALOX/MYLANTA) 200-200-20 MG/5ML suspension 30 mL  30 mL Oral Q6H PRN Ileana Roup, MD       diazepam (VALIUM) tablet 5 mg  5 mg Oral Q6H PRN Ralene Ok, MD   5 mg at 08/04/21 1601   diphenhydrAMINE (BENADRYL) 12.5 MG/5ML elixir 12.5 mg  12.5 mg Oral Q6H PRN Ileana Roup, MD       Or   diphenhydrAMINE (BENADRYL) injection 12.5 mg  12.5 mg Intravenous Q6H PRN Ileana Roup, MD       heparin injection 5,000 Units  5,000 Units Subcutaneous Q8H Ileana Roup, MD   5,000 Units at 08/05/21 0631   hydrALAZINE (APRESOLINE) injection 10 mg  10 mg Intravenous Q2H PRN Ileana Roup, MD       HYDROcodone-acetaminophen (NORCO/VICODIN) 5-325 MG per tablet 1 tablet  1 tablet Oral Q6H PRN Ileana Roup, MD   1 tablet at 08/04/21 2203   ibuprofen (ADVIL) tablet 600 mg  600 mg Oral Q6H PRN Ileana Roup, MD   600 mg at 08/03/21 2138   loratadine (CLARITIN) tablet 10 mg  10 mg Oral Daily PRN Ileana Roup, MD       melatonin tablet 5 mg  5 mg Oral QHS PRN Ileana Roup, MD       morphine (PF) 2 MG/ML injection 2 mg  2 mg Intravenous Q3H PRN Ileana Roup, MD   2 mg at 08/01/21 1345   ondansetron (ZOFRAN) tablet 4 mg  4 mg Oral Q6H PRN Ileana Roup, MD       Or   ondansetron [facility]) injection 4 mg  4 mg Intravenous Q6H PRN Ileana Roup, MD   4 mg at 08/01/21 2035   oxyCODONE (Oxy IR/ROXICODONE) immediate release tablet 10 mg  10 mg Oral Q4H PRN Ileana Roup, MD       pantoprazole (PROTONIX) EC tablet 40 mg  40 mg Oral Daily Ileana Roup, MD   40 mg at 08/04/21 1121   promethazine (PHENERGAN) 12.5 mg in sodium chloride 0.9 % 50 mL IVPB  12.5 mg Intravenous Q6H PRN  Ileana Roup, MD       propranolol (INDERAL) tablet 20 mg  20 mg Oral BID PRN Ileana Roup, MD       simethicone [facility]) chewable tablet 40 mg  40 mg Oral Q6H PRN Ileana Roup, MD         Allergies  Allergen Reactions   Codeine Nausea And Vomiting   Dilaudid [Hydromorphone] Nausea And Vomiting   Mushroom Extract Complex Hives   Oxycodone Nausea And Vomiting   Tegaderm Ag Mesh [Silver] Rash    Allergic reaction to Tegaderm dressing    Signed:   Adin Hector, MD, FACS, MASCRS Esophageal, Gastrointestinal & Colorectal Surgery Robotic and Minimally Invasive Surgery  [facility], [place]. [address] [place], [place] [address] [phone] Fax [phone] Main  CONTACT INFORMATION:  Weekday (9AM-5PM): Call [facility] main office at [phone]  Weeknight (5PM-9AM) or Weekend/Holiday: Check [email or web address] (password " [email or web address]") for General Surgery [facility] coverage  (Please, do not use SecureChat as it is not reliable communication to operating surgeons for immediate patient care)      08/05/2021, 8:12 AM

## 2021-08-07 ENCOUNTER — Other Ambulatory Visit: Payer: Self-pay

## 2021-08-07 ENCOUNTER — Telehealth: Payer: Self-pay

## 2021-08-07 ENCOUNTER — Other Ambulatory Visit: Payer: Self-pay | Admitting: *Deleted

## 2021-08-07 ENCOUNTER — Other Ambulatory Visit (HOSPITAL_COMMUNITY): Payer: Self-pay

## 2021-08-07 DIAGNOSIS — C20 Malignant neoplasm of rectum: Secondary | ICD-10-CM

## 2021-08-07 DIAGNOSIS — Z9889 Other specified postprocedural states: Secondary | ICD-10-CM

## 2021-08-07 MED ORDER — POTASSIUM CHLORIDE CRYS ER 20 MEQ PO TBCR
20.0000 meq | EXTENDED_RELEASE_TABLET | Freq: Every day | ORAL | 0 refills | Status: DC
  Filled 2021-08-07: qty 30, 30d supply, fill #0

## 2021-08-07 NOTE — Telephone Encounter (Signed)
Mrs. Demuro called and stated her potassium was mildly low 3.3 and the surgery Dr. Johney Maine recommend her to start back taking her potassium. The patient recently had a diverting loop ileostomy. Patient did have several episode of diarrhea start on 08/03/21 to current. She is going about three time a day with loose stool. Patient denied any pain or fever. She is currently not taking any antidiarrheal. She would like a new prescription of potassium. I discuss the matter with Dr [name] and he recommend 20 MEQ daily with a 30 days supply w/o refill. And repeat lab end of next week. Patient is schedule for lab 08/18/21 to repeat BMP

## 2021-08-07 NOTE — Patient Outreach (Signed)
Received a hospital discharge referral from [facility] Liaison . I have assigned Valente David, RN to call for follow up and determine if there are any Case Management needs.    [place] Care, [place], [place] Management [phone]

## 2021-08-07 NOTE — Patient Outreach (Signed)
[facility]) Care Management  08/07/2021  SAVHANNA SLIVA [birth year] [id number]    [place] Organization [ACO] Patient: [facility] [employer] Employee Plan  Primary Care Provider: [place], Blondell Reveal, MD, [facility]   Referral request for [facility] plan for disease management and community resource support.         Plan: REquest for  [place] Coordinator provide follow up support   For additional questions or referrals please contact:   Natividad Brood, RN BSN [facility] Liaison  [phone] business mobile phone Toll free office [phone]  Fax number: [phone] [email or web address]

## 2021-08-07 NOTE — Patient Outreach (Signed)
[facility]) Care Management  08/07/2021  ELAYNE GRUVER 1980-05-13 [id number]    Referral received: 7/3 Initial outreach: 7/3 Surgery/procedure date: 6/26 Insurance: [place] [employer]/Choice/Save Plan  Referral received as member was admitted to hospital 6/26-7/1 for closure of ileostomy.  Call placed to member, no answer, unable to leave message as mailbox is full.  Will follow up within the next 2-3 business days.  Valente David, RN, MSN, [place] Manager [phone]

## 2021-08-09 ENCOUNTER — Telehealth: Payer: Self-pay | Admitting: *Deleted

## 2021-08-09 ENCOUNTER — Other Ambulatory Visit: Payer: Self-pay | Admitting: *Deleted

## 2021-08-09 NOTE — Telephone Encounter (Signed)
Attempted to reach patient to f/u on her po intake and diarrhea since hospital D/C. No answer and voice mail is full.

## 2021-08-09 NOTE — Patient Outreach (Signed)
[facility]) Care Management  08/09/2021  ZETTIE GOOTEE 21-Aug-1980 [id number]   Outreach attempt #2, unsuccessful, unable to leave message as mailbox is full.  Will send outreach letter and follow up within the next 5-7 business days.  Valente David, RN, MSN, [place] Manager [phone]

## 2021-08-15 ENCOUNTER — Inpatient Hospital Stay: Payer: 59

## 2021-08-16 ENCOUNTER — Ambulatory Visit: Payer: Self-pay | Admitting: *Deleted

## 2021-08-16 NOTE — Patient Outreach (Addendum)
  Care Coordination [facility] Note Transition Care Management Follow-up Telephone Call Date of discharge and from where: 7/1 from [place] How have you been since you were released from the hospital? Doing ok Any questions or concerns? No  Items Reviewed: Did the pt receive and understand the discharge instructions provided? Yes  Medications obtained and verified? Yes  Other? No  Any new allergies since your discharge? No  Dietary orders reviewed? No Do you have support at home? Yes   Home Care and Equipment/Supplies: Were home health services ordered? no If so, what is the name of the agency? N/A  Has the agency set up a time to come to the patient's home? not applicable Were any new equipment or medical supplies ordered?  No What is the name of the medical supply agency? N/A Were you able to get the supplies/equipment? not applicable Do you have any questions related to the use of the equipment or supplies? No  Functional Questionnaire: (I = Independent and D = Dependent) ADLs: I  Bathing/Dressing- I  Meal Prep- I  Eating- I  Maintaining continence- I  Transferring/Ambulation- I  Managing Meds- I  Follow up appointments reviewed:  PCP Hospital f/u appt confirmed? Yes  Scheduled to see [name] on 7/18 @ 330pm. [facility] f/u appt confirmed? Yes  Scheduled to see [name] on 7/25 @ 820am and Dr. Ninfa Linden on 8/22 @ 915 am. Are transportation arrangements needed? No  If their condition worsens, is the pt aware to call PCP or go to the Emergency Dept.? Yes Was the patient provided with contact information for the PCP's office or ED? Yes Was to pt encouraged to call back with questions or concerns? Yes  SDOH assessments and interventions completed:   No  Care Coordination Interventions Activated:  Yes Care Coordination Interventions:   Appointments reviewed  Encounter Outcome:  Pt. Visit Completed   Valente David, RN, MSN, [place] Manager [phone]

## 2021-08-18 ENCOUNTER — Inpatient Hospital Stay: Payer: 59 | Attending: Oncology

## 2021-08-18 ENCOUNTER — Telehealth: Payer: Self-pay

## 2021-08-18 DIAGNOSIS — Z79899 Other long term (current) drug therapy: Secondary | ICD-10-CM | POA: Insufficient documentation

## 2021-08-18 DIAGNOSIS — R109 Unspecified abdominal pain: Secondary | ICD-10-CM | POA: Insufficient documentation

## 2021-08-18 DIAGNOSIS — G629 Polyneuropathy, unspecified: Secondary | ICD-10-CM | POA: Insufficient documentation

## 2021-08-18 DIAGNOSIS — C20 Malignant neoplasm of rectum: Secondary | ICD-10-CM | POA: Diagnosis not present

## 2021-08-18 LAB — BASIC METABOLIC PANEL - CANCER CENTER ONLY
Anion gap: 11 (ref 5–15)
BUN: 5 mg/dL — ABNORMAL LOW (ref 6–20)
CO2: 29 mmol/L (ref 22–32)
Calcium: 10.1 mg/dL (ref 8.9–10.3)
Chloride: 100 mmol/L (ref 98–111)
Creatinine: 0.74 mg/dL (ref 0.44–1.00)
GFR, Estimated: >60 mL/min (ref >60)
Glucose, Bld: 84 mg/dL (ref 70–99)
Potassium: 3.7 mmol/L (ref 3.5–5.1)
Sodium: 140 mmol/L (ref 135–145)

## 2021-08-18 NOTE — Telephone Encounter (Signed)
Patient gave verbal understanding and had no further questions or concerns  

## 2021-08-18 NOTE — Telephone Encounter (Signed)
-----   Message from Ladell Pier, MD sent at 08/18/2021  2:40 PM EDT ----- Please call patient, the potassium is normal, continue potassium chloride, follow-up as scheduled

## 2021-08-22 ENCOUNTER — Ambulatory Visit (INDEPENDENT_AMBULATORY_CARE_PROVIDER_SITE_OTHER): Payer: 59 | Admitting: Family Medicine

## 2021-08-22 ENCOUNTER — Encounter (HOSPITAL_BASED_OUTPATIENT_CLINIC_OR_DEPARTMENT_OTHER): Payer: Self-pay | Admitting: Family Medicine

## 2021-08-22 VITALS — BP 128/99 | HR 108 | Ht 66.0 in | Wt 149.2 lb

## 2021-08-22 DIAGNOSIS — R634 Abnormal weight loss: Secondary | ICD-10-CM | POA: Diagnosis not present

## 2021-08-22 DIAGNOSIS — Z0000 Encounter for general adult medical examination without abnormal findings: Secondary | ICD-10-CM

## 2021-08-22 DIAGNOSIS — Z9889 Other specified postprocedural states: Secondary | ICD-10-CM

## 2021-08-22 NOTE — Assessment & Plan Note (Signed)
Patient reports that due to ongoing stressors as well as recent surgery, she has had some weight loss in recent months.  In reviewing chart, she has lost about 12 pounds since last visit with me a couple months ago.  Due to this, she is requesting referral to nutritionist for further discussion regarding ways to help with improving her diet and avoiding any further weight loss.  Feel that this is reasonable, referral to nutrition has been placed.

## 2021-08-22 NOTE — Progress Notes (Signed)
    Procedures performed today:    None.  Independent interpretation of notes and tests performed by another provider:   None.  Brief History, Exam, Impression, and Recommendations:    BP (!) 128/99   Pulse (!) 108   Ht '5\' 6"'$  (1.676 m)   Wt 149 lb 3.2 oz (67.7 kg)   SpO2 100%   BMI 24.08 kg/m   Recent weight loss Patient reports that due to ongoing stressors as well as recent surgery, she has had some weight loss in recent months.  In reviewing chart, she has lost about 12 pounds since last visit with me a couple months ago.  Due to this, she is requesting referral to nutritionist for further discussion regarding ways to help with improving her diet and avoiding any further weight loss.  Feel that this is reasonable, referral to nutrition has been placed.  S/P closure of ileostomy Since last appointment with me, she did have her ileostomy reversed.  Continues to recover from procedure.  Still has irregular bowel movements and will have alternating days where she will have more frequent bowel movements and other days with decreased bowel movements/constipation.  She recently had follow-up with surgeon and next visit with them will be in about 3 months.  She does have follow-up scheduled with oncology next week.  Reports being advised that it can take up to 6 to 12 months for bowel habits to return to normal.  As above, referral been placed for assistance with adjusting diet and ensuring adequate nutritional intake.  Return in about 4 weeks (around 09/19/2021) for CPE with [facility] a few days prior.   ___________________________________________ Jacque Garrels de [place], MD, ABFM, CAQSM [facility] [place]

## 2021-08-22 NOTE — Assessment & Plan Note (Signed)
Since last appointment with me, she did have her ileostomy reversed.  Continues to recover from procedure.  Still has irregular bowel movements and will have alternating days where she will have more frequent bowel movements and other days with decreased bowel movements/constipation.  She recently had follow-up with surgeon and next visit with them will be in about 3 months.  She does have follow-up scheduled with oncology next week.  Reports being advised that it can take up to 6 to 12 months for bowel habits to return to normal.  As above, referral been placed for assistance with adjusting diet and ensuring adequate nutritional intake.

## 2021-08-25 ENCOUNTER — Ambulatory Visit (HOSPITAL_BASED_OUTPATIENT_CLINIC_OR_DEPARTMENT_OTHER)
Admission: RE | Admit: 2021-08-25 | Discharge: 2021-08-25 | Disposition: A | Discharge status: Home / Self Care | Payer: 59 | Source: Ambulatory Visit | Attending: Oncology | Admitting: Oncology

## 2021-08-25 ENCOUNTER — Encounter (HOSPITAL_BASED_OUTPATIENT_CLINIC_OR_DEPARTMENT_OTHER): Payer: Self-pay

## 2021-08-25 DIAGNOSIS — Z85048 Personal history of other malignant neoplasm of rectum, rectosigmoid junction, and anus: Secondary | ICD-10-CM | POA: Diagnosis not present

## 2021-08-25 DIAGNOSIS — C20 Malignant neoplasm of rectum: Secondary | ICD-10-CM | POA: Diagnosis not present

## 2021-08-25 DIAGNOSIS — N200 Calculus of kidney: Secondary | ICD-10-CM | POA: Diagnosis not present

## 2021-08-25 MED ORDER — IOHEXOL 300 MG/ML  SOLN
100.0000 mL | Freq: Once | INTRAMUSCULAR | Status: AC | PRN
  Administered 2021-08-25: 80 mL via INTRAVENOUS

## 2021-08-25 MED ORDER — IOHEXOL 300 MG/ML  SOLN: 80.0000 mL | Freq: Once | INTRAMUSCULAR | Status: DC | PRN

## 2021-08-28 ENCOUNTER — Other Ambulatory Visit: Payer: Self-pay

## 2021-08-29 ENCOUNTER — Encounter: Payer: Self-pay | Admitting: Oncology

## 2021-08-29 ENCOUNTER — Other Ambulatory Visit (HOSPITAL_COMMUNITY): Payer: Self-pay

## 2021-08-29 ENCOUNTER — Other Ambulatory Visit: Payer: Self-pay

## 2021-08-29 ENCOUNTER — Encounter (HOSPITAL_COMMUNITY): Payer: Self-pay | Admitting: Nurse Practitioner

## 2021-08-29 ENCOUNTER — Inpatient Hospital Stay: Payer: 59

## 2021-08-29 ENCOUNTER — Inpatient Hospital Stay: Payer: 59 | Admitting: Oncology

## 2021-08-29 ENCOUNTER — Telehealth: Payer: Self-pay | Admitting: *Deleted

## 2021-08-29 VITALS — BP 126/92 | HR 110 | Temp 98.1°F | Resp 20 | Ht 66.0 in | Wt 148.8 lb

## 2021-08-29 DIAGNOSIS — G629 Polyneuropathy, unspecified: Secondary | ICD-10-CM | POA: Diagnosis not present

## 2021-08-29 DIAGNOSIS — R109 Unspecified abdominal pain: Secondary | ICD-10-CM | POA: Diagnosis not present

## 2021-08-29 DIAGNOSIS — C20 Malignant neoplasm of rectum: Secondary | ICD-10-CM

## 2021-08-29 DIAGNOSIS — Z79899 Other long term (current) drug therapy: Secondary | ICD-10-CM | POA: Diagnosis not present

## 2021-08-29 LAB — BASIC METABOLIC PANEL - CANCER CENTER ONLY
Anion gap: 13 (ref 5–15)
BUN: 7 mg/dL (ref 6–20)
CO2: 28 mmol/L (ref 22–32)
Calcium: 10.1 mg/dL (ref 8.9–10.3)
Chloride: 101 mmol/L (ref 98–111)
Creatinine: 0.67 mg/dL (ref 0.44–1.00)
GFR, Estimated: >60 mL/min (ref >60)
Glucose, Bld: 89 mg/dL (ref 70–99)
Potassium: 4.1 mmol/L (ref 3.5–5.1)
Sodium: 142 mmol/L (ref 135–145)

## 2021-08-29 LAB — CEA (ACCESS): CEA (CHCC): <1 ng/mL (ref 0.00–5.00)

## 2021-08-29 MED ORDER — CYCLOBENZAPRINE HCL 10 MG PO TABS
10.0000 mg | ORAL_TABLET | Freq: Three times a day (TID) | ORAL | 0 refills | Status: DC | PRN
  Filled 2021-08-29 – 2021-08-31 (×2): qty 30, 10d supply, fill #0

## 2021-08-29 MED ORDER — CYCLOBENZAPRINE HCL 10 MG PO TABS: 10.0000 mg | ORAL_TABLET | Freq: Three times a day (TID) | ORAL | 0 refills | Status: DC | PRN

## 2021-08-29 MED ORDER — GABAPENTIN 300 MG PO CAPS
300.0000 mg | ORAL_CAPSULE | Freq: Two times a day (BID) | ORAL | 1 refills | Status: DC
  Filled 2021-08-29 – 2021-08-31 (×2): qty 60, 30d supply, fill #0
  Filled 2021-11-20: qty 60, 30d supply, fill #1

## 2021-08-29 MED ORDER — GABAPENTIN 300 MG PO CAPS: 300.0000 mg | ORAL_CAPSULE | Freq: Two times a day (BID) | ORAL | 1 refills | Status: DC

## 2021-08-29 NOTE — Progress Notes (Signed)
Sharon Bennett OFFICE PROGRESS NOTE   Diagnosis: Rectal cancer  INTERVAL HISTORY:   Sharon Bennett underwent ileostomy takedown 07/31/2021.  She was discharged 08/05/2021.  She reports irregular bowel habits.  She has small-volume formed stools in "clusters ".  Sometimes she has to have a bowel movement every 5 minutes.  She feels that she will be incontinent if she does not not have access to a restroom.  She has intermittent abdominal cramps, improved with Flexeril.  She continues to have numbness, tingling, and discomfort in the feet.  This occurs chiefly at night.  Cymbalta caused blurred vision.  She has a poor appetite.   Objective:  Vital signs in last 24 hours:  Blood pressure (!) 126/92, pulse (!) 110, temperature 98.1 F (36.7 C), temperature source Oral, resp. rate 20, height 5' 6"  (1.676 m), weight 148 lb 12.8 oz (67.5 kg), SpO2 100 %.    Lymphatics: Cervical, supraclavicular, axillary, or inguinal nodes Resp: Lungs clear bilaterally Cardio: Regular rate and rhythm GI: No hepatosplenomegaly, no mass, nontender, healed ileostomy site at the right mid abdomen Vascular: No leg edema     Lab Results:  Lab Results  Component Value Date   WBC 6.3 08/02/2021   HGB 9.8 (L) 08/02/2021   HCT 32.2 (L) 08/02/2021   MCV 85.0 08/02/2021   PLT 240 08/02/2021   NEUTROABS 6.5 04/19/2021    CMP  Lab Results  Component Value Date   NA 140 08/18/2021   K 3.7 08/18/2021   CL 100 08/18/2021   CO2 29 08/18/2021   GLUCOSE 84 08/18/2021   BUN 5 (L) 08/18/2021   CREATININE 0.74 08/18/2021   CALCIUM 10.1 08/18/2021   PROT 7.5 04/19/2021   ALBUMIN 3.7 04/19/2021   AST 26 04/19/2021   ALT 31 04/19/2021   ALKPHOS 62 04/19/2021   BILITOT 0.5 04/19/2021   GFRNONAA >60 08/18/2021    Lab Results  Component Value Date   CEA 1.48 09/15/2020    Imaging:  CT CHEST ABDOMEN PELVIS W CONTRAST  Result Date: 08/26/2021 CLINICAL DATA:  History of rectal cancer,  surveillance. * Tracking Code: BO * EXAM: CT CHEST, ABDOMEN, AND PELVIS WITH CONTRAST TECHNIQUE: Multidetector CT imaging of the chest, abdomen and pelvis was performed following the standard protocol during bolus administration of intravenous contrast. RADIATION DOSE REDUCTION: This exam was performed according to the departmental dose-optimization program which includes automated exposure control, adjustment of the mA and/or kV according to patient size and/or use of iterative reconstruction technique. CONTRAST:  48m OMNIPAQUE IOHEXOL 300 MG/ML  SOLN COMPARISON:  CT April 07, 2021 FINDINGS: CT CHEST FINDINGS Cardiovascular: Normal caliber aorta. No central pulmonary embolus on this nondedicated study. Normal size heart. No significant pericardial effusion/thickening. Mediastinum/Nodes: No suspicious thyroid nodule. No pathologically enlarged mediastinal, hilar or axillary lymph nodes. The esophagus is grossly unremarkable. Lungs/Pleura: No suspicious pulmonary nodules or masses. No focal airspace consolidation. No pleural effusion. No pneumothorax. Musculoskeletal: No aggressive lytic or blastic lesion of bone. No suspicious chest wall mass. CT ABDOMEN PELVIS FINDINGS Hepatobiliary: No suspicious hepatic lesion. Gallbladder is unremarkable. No biliary ductal dilation. Pancreas: No pancreatic ductal dilation or evidence of acute inflammation. Spleen: No splenomegaly or focal splenic lesion. Adrenals/Urinary Tract: Bilateral adrenal glands appear normal. Punctate nonobstructive right lower pole renal stone. No hydronephrosis. Kidneys demonstrate symmetric enhancement and excretion of contrast material. Stomach/Bowel: Radiopaque enteric contrast material traverses distal loops of small bowel. Postsurgical change of low anterior resection with ostomy formation and subsequent reversal. Rectosigmoid  anastomotic sutures are present. Prominent perirectal/presacral soft tissue and fluid without discrete enhancing  nodularity. Sutures in the anterior hemipelvis from ostomy and subsequent reversal. Moderate volume of formed stool throughout the colon. No evidence of acute bowel inflammation. Vascular/Lymphatic: Normal caliber abdominal aorta. No pathologically enlarged abdominal or pelvic lymph nodes. Reproductive: Leiomyomatous uterus.  No suspicious adnexal mass. Other: Changes in the abdominal wall related to prior ostomy and surgery. No discrete peritoneal or omental nodularity. Musculoskeletal: No aggressive lytic or blastic lesion of bone. L5-S1 discogenic disease. Small sclerotic lesions in the right L5 vertebral body and left hemi sacrum are stable over multiple priors and likely reflect small bone islands. IMPRESSION: 1. Postsurgical change of low anterior resection with Hartmann's pouch and right lower quadrant ostomy formation with subsequent reversal. Perirectal/presacral soft tissue thickening and fluid without discrete enhancing nodularity are favored post treatment/postsurgical change. Attention on follow-up imaging suggested. 2. No evidence of metastatic disease in the chest, abdomen or pelvis. 3. Punctate nonobstructive right renal calculus. Electronically Signed   By: Dahlia Bailiff M.D.   On: 08/26/2021 13:14    Medications: I have reviewed the patient's current medications.   Assessment/Plan: Rectal cancer  CT abdomen/pelvis 08/21/2020-large rectal mass measuring 4.4 x 2.6 x 5.5 cm, haziness in the mesorectal fat, obscuration of the fat plane between the mass and the adjacent posterior aspect of the uterus, numerous prominent mesorectal lymph nodes measuring up to 5 mm Colonoscopy 08/23/2020-fungating infiltrative nonobstructing large mass found in the rectum.  Mass was noncircumferential, measured 5 cm in length, 3 cm in diameter and was 7 cm from the anal verge.  No bleeding was present.   Pathology showed invasive colonic adenocarcinoma, moderately differentiated.  No evidence of mismatch repair  deficiency. Chest CT 09/01/2020-no evidence of metastatic disease MRI pelvis 09/02/2020-tumor at 10.4 cm from anal verge, 5.9 cm from the internal anal sphincter, T3, approximately 15 greater than 5 mm lymph nodes, N2b, multiple uterine fibroids Cycle 1 FOLFOX 09/15/2020 Cycle 2 FOLFOX 09/29/2020 Cycle 3 FOLFOX 10/13/2020 Cycle 4 FOLFOX 10/27/2020 Cycle 5 FOLFOX 11/10/2020 Cycle 6 FOLFOX 11/24/2020 Cycle 7 FOLFOX 12/08/2020 Cycle 8 FOLFOX 01/05/2021, Ziextenzo Radiation/Xeloda 01/23/2021-03/03/2021 Robotic assisted low anterior resection/loop ileostomy 05/10/2021-invasive adenocarcinoma, tumor extends into muscularis, negative resection margins, 0/19 nodes, grade 2, no lymphovascular perineural invasion, normal mismatch repair protein expression,[id number] Ileostomy takedown 07/31/2021 CTs 08/25/2021-no evidence of metastatic disease 2.   rectal bleeding, change in bowel habits secondary to #1 3.   Uterine fibroids noted on MRI pelvis 09/02/2020 4.   Port-A-Cath placement 09/09/2020, Interventional Radiology, Port-A-Cath removed 04/04/2021 5.  Admission 12/14/2020 with febrile neutropenia, E. coli bacteremia-completed an outpatient course of Augmentin 6.  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  Right upper extremity pain-03/31/2021 MRI cervical spine-no impingement, inflammation or mass to explain symptoms; disc bulging and ridging at C4-5 to C6-7.  C6-7 posterior cervical laminotomy and foraminotomy and microdiscectomy 04/17/2021 8.  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  She completed neoadjuvant therapy followed by a low anterior resection.  She underwent ileostomy takedown last month.  She is in clinical remission.  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  She would like to try a different  medical therapy.  She will begin a trial of gabapentin.  She understands the potential for somnolence with gabapentin.  She will be referred to the pelvic physical therapy  clinic.  I refilled her prescription for Flexeril to use for abdominal cramping.  She requested surveillance with circulating tumor DNA testing.  She will be tested with Guardant revealed today.  Sharon Bennett will return for an office visit in 2 months.  She will plan for a surveillance colonoscopy with Dr. Benson Norway in approximately 6 months.  [name], MD  08/29/2021  9:45 AM

## 2021-08-29 NOTE — Telephone Encounter (Signed)
Informed Sharon Bennett that [name] prefers her surgeon to prescribe the dioctyl medication.

## 2021-08-29 NOTE — Telephone Encounter (Signed)
Confirmed w/Ms. Westerfeld that IR removed her port in 03/2021. Informed her that [place] is on vacation this week, so keeping her current nutrition appointment on 8/1 is advised and she agrees. Placed referral for pelvic rehab and confirmed receipt with PT. She call surgeon office regarding the clustering of bowel movements she has: loose several, then constipated and nurse recommended dioctyl. She is asking if [name] would prescribe this (was told her surgeon is in surgery all week).

## 2021-08-30 ENCOUNTER — Other Ambulatory Visit: Payer: Self-pay | Admitting: *Deleted

## 2021-08-30 ENCOUNTER — Other Ambulatory Visit: Payer: Self-pay

## 2021-08-30 DIAGNOSIS — C20 Malignant neoplasm of rectum: Secondary | ICD-10-CM

## 2021-08-31 ENCOUNTER — Other Ambulatory Visit (HOSPITAL_COMMUNITY): Payer: Self-pay

## 2021-09-01 ENCOUNTER — Other Ambulatory Visit: Payer: Self-pay

## 2021-09-01 ENCOUNTER — Ambulatory Visit: Payer: 59 | Attending: Oncology | Admitting: Physical Therapy

## 2021-09-01 DIAGNOSIS — M6281 Muscle weakness (generalized): Secondary | ICD-10-CM

## 2021-09-01 DIAGNOSIS — R293 Abnormal posture: Secondary | ICD-10-CM

## 2021-09-01 DIAGNOSIS — C20 Malignant neoplasm of rectum: Secondary | ICD-10-CM | POA: Insufficient documentation

## 2021-09-01 DIAGNOSIS — M62838 Other muscle spasm: Secondary | ICD-10-CM

## 2021-09-01 DIAGNOSIS — R279 Unspecified lack of coordination: Secondary | ICD-10-CM

## 2021-09-01 NOTE — Patient Instructions (Signed)
   Balloon breathing: when you feel the urge to strain during bowel movements, make an open first, bring to mouth and blow through it like blowing up a balloon.

## 2021-09-01 NOTE — Therapy (Signed)
OUTPATIENT PHYSICAL THERAPY FEMALE PELVIC EVALUATION   Patient Name: Sharon Bennett MRN: [id number] DOB:06-09-1980, 41 y.o., female Today's Date: 09/01/2021   PT End of Session - 09/01/21 0805     Visit Number 1    Date for PT Re-Evaluation 12/02/21    Authorization Type [facility]    PT Start Time 0800    PT Stop Time 0840    PT Time Calculation (min) 40 min    Activity Tolerance Patient tolerated treatment well    Behavior During Therapy [facility] for tasks assessed/performed             Past Medical History:  Diagnosis Date   Anxiety    Bursitis    in hips per pt   Colorectal cancer ([place])    Depression    Elevated blood pressure reading 05/22/2021   Family history of brain cancer    Family history of breast cancer    Family history of pancreatic cancer    GERD (gastroesophageal reflux disease)    PONV (postoperative nausea and vomiting)    Tachycardia 05/22/2021   Past Surgical History:  Procedure Laterality Date   FLEXIBLE SIGMOIDOSCOPY N/A 05/10/2021   Procedure: FLEXIBLE SIGMOIDOSCOPY;  Surgeon: Ileana Roup, MD;  [facility];  Service: General;  Laterality: N/A;   FLEXIBLE SIGMOIDOSCOPY N/A 07/19/2021   Procedure: Beryle Quant;  Surgeon: Ileana Roup, MD;  Location: [facility];  Service: General;  Laterality: N/A;   ILEOSTOMY CLOSURE N/A 07/31/2021   Procedure: OPEN ILEOSTOMY TAKEDOWN;  Surgeon: Ileana Roup, MD;  [facility];  Service: General;  Laterality: N/A;   IR IMAGING GUIDED PORT INSERTION  09/09/2020   IR REMOVAL TUN ACCESS W/ PORT W/O FL MOD SED  04/04/2021   LEEP     2015   POSTERIOR CERVICAL LAMINECTOMY N/A 04/19/2021   Procedure: CERVICAL LAMINECTOMY FOR EPIDURAL [place];  Surgeon: Earnie Larsson, MD;  Location: [facility];  Service: Neurosurgery;  Laterality: N/A;   XI ROBOTIC ASSISTED LOWER ANTERIOR RESECTION N/A 05/10/2021   Procedure: XI ROBOTIC ASSISTED LOWER ANTERIOR RESECTION WITH DIVERTING LOOP ILEOSTOMY,  INTRAOPERATIVE PERFUSION ASSESSMENT WITH FIREFLY INJECTION;  Surgeon: Ileana Roup, MD;  [facility];  Service: General;  Laterality: N/A;   Patient Active Problem List   Diagnosis Date Noted   Recent weight loss 08/22/2021   S/P closure of ileostomy 07/31/2021   Phase of life problem 06/19/2021   Tachycardia 05/22/2021   Elevated blood pressure reading 05/22/2021   Chest pain 04/26/2021   Epidural hematoma ([place]) 04/19/2021   Abnormal cervical Papanicolaou smear 03/21/2021   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/21/2021   Bacteremia 12/15/2020   Elevated transaminase level 12/15/2020   Genetic testing 10/06/2020   Uterine fibroid 09/22/2020   Family history of pancreatic cancer 09/09/2020   Family history of brain cancer 09/09/2020   Family history of breast cancer 09/09/2020   Rectal cancer ([place]) 09/05/2020    PCP: de [place], Raymond J, MD  REFERRING PROVIDER: Ladell Pier, MD   REFERRING DIAG:  C20 (ICD-10-CM) - Rectal cancer [facility])  THERAPY DIAG:  Muscle weakness (generalized)  Unspecified lack of coordination  Abnormal posture  Other muscle spasm  Rationale for Evaluation and Treatment Rehabilitation  ONSET DATE: 07/31/21  SUBJECTIVE:  SUBJECTIVE STATEMENT: Pt reports she has multiple several bowel movements per day, seems like I go every few minutes. Does feel like she strains consistently to empty bowel movements.  Pt also has urinary urgency.  Fluid intake: Yes: 64oz of water per day; teas; apple juice/prune juice     PAIN:  Are you having pain? Yes ( not now but when she does) NPRS scale: 3/10 Pain location:  lower abdomen  Pain type: cramping Pain description: intermittent   Aggravating factors: certain foods (apples, fruits)  Relieving factors:  voiding  PRECAUTIONS: None  WEIGHT BEARING RESTRICTIONS No  FALLS:  Has patient fallen in last 6 months? 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  Mass was noncircumferential, measured 5 cm in length, 3 cm in diameter and was 7 cm from the anal verge., Chest CT 09/01/2020-no evidence of metastatic disease, MRI pelvis 09/02/2020-tumor at 10.4 cm from anal verge, 5.9 cm from the internal anal sphincter, T3, approximately 15 greater than 5 mm lymph nodes, N2b, multiple uterine fibroids, 8 cycles FOLFOX 09/15/20-01/23/21,  Radiation/Xeloda 01/23/2021-03/03/2021, . Frequency,urgency, incontinence of bowel movements. Pelvic rehab requested. Sexual abuse: No  BOWEL MOVEMENT Pain with bowel movement: Yes sometimes but not often Type of bowel movement:Type (Bristol Stool Scale) 6, Frequency 20x per day, and Strain Yes Fully empty rectum: No Leakage: No Pads: No Fiber supplement: Yes: was trying metamucil but has switched to citrucel  URINATION Pain with urination: No Fully empty bladder: No Stream:  not always strong, sometimes weak Urgency: Yes:   Frequency: not as bad during the day, at night she wakes sometimes every 30 minutes to empty at worst, at best every 3 hours  Leakage:  none  Pads: No  INTERCOURSE Pain with intercourse:  not active   PREGNANCY Vaginal deliveries 2 Tearing Yes: episiotomy with first  C-section deliveries 0 Currently pregnant No  PROLAPSE None    OBJECTIVE:   DIAGNOSTIC FINDINGS:    COGNITION:  Overall cognitive status: Within functional limits for tasks  assessed     SENSATION:  Light touch: Appears intact  Proprioception: Appears intact  MUSCLE LENGTH: Bil hamstrings and adductors limited by 25%               POSTURE: rounded shoulders, forward head, and posterior pelvic tilt    LUMBARAROM/PROM  A/PROM A/PROM  eval  Flexion Limited by 50%  Extension WFL  Right lateral flexion Limited by 25%  Left lateral flexion Limited by 25%  Right rotation Limited by 50%  Left rotation Limited by 50%   (Blank rows = not tested)  LOWER EXTREMITY ROM:  WFL  LOWER EXTREMITY MMT:  Bil hip abduction 3+/5, all other hip 4/5; ankles and knees 5/5   PALPATION:   General  no TTP but did have significant scar tissue build up with fascial restrictions at ostomy reversal scar site and surrounding lap scars.                 External Perineal Exam pt deferred                              Internal  Pelvic Floor deferred   Patient confirms identification and approves PT to assess internal pelvic floor and treatment No  PELVIC MMT:   MMT eval  Vaginal   Internal Anal Sphincter   External Anal Sphincter   Puborectalis   Diastasis Recti   (Blank rows = not tested)        TONE: Deferred   PROLAPSE: deferred  TODAY'S TREATMENT  EVAL Examination completed, findings reviewed, pt educated on POC, HEP, and abdominal massage. Pt motivated to participate in PT and agreeable to attempt recommendations.     PATIENT EDUCATION:  Education details: [id number] Person educated: Patient Education method: Consulting civil engineer, Demonstration, Tactile cues, Verbal cues, and Handouts Education comprehension: verbalized understanding and returned demonstration   HOME EXERCISE PROGRAM: [id number]  ASSESSMENT:  CLINICAL IMPRESSION: Patient is a 41 y.o. female  who was seen today for physical therapy evaluation and treatment for urinary urgency, frequent bowel movements, frequent urge to have bowel movement, straining with bowel movement, abdominal cramping  status post rectal CA with chemo and radiation treatments, ileostomy takedown 07/31/21. Pt reports "everything feels tight". Pt found to have fascial restrictions throughout abdomen, scar tissue build up at surgery sites, decreased flexibility in spine and bil hips, decreased hip strength, decreased core strength. Pt deferred internal vaginal or rectal assessment and requested to wait on this. Pt educated on voiding and breathing mechanics, abdominal massage, and HEP. Handouts given and reviewed for these as well. Pt would benefit from additional PT to further address deficits.     OBJECTIVE IMPAIRMENTS decreased coordination, decreased endurance, decreased mobility, decreased strength, increased fascial restrictions, increased muscle spasms, impaired flexibility, improper body mechanics, postural dysfunction, and pain.   ACTIVITY LIMITATIONS continence  PARTICIPATION LIMITATIONS: interpersonal relationship, community activity, and occupation  [place], Time since onset of injury/illness/exacerbation, and 1 comorbidity: medical history  are also affecting patient's functional outcome.   REHAB POTENTIAL: Good  CLINICAL DECISION MAKING: Stable/uncomplicated  EVALUATION COMPLEXITY: Low   GOALS: Goals reviewed with patient? Yes  SHORT TERM GOALS: Target date: 09/29/2021  Pt to be I with HEP.  Baseline: Goal status: INITIAL  2.  Pt will have 25% less urgency due to bladder retraining and strengthening  Baseline:  Goal status: INITIAL  3.  Pt to demonstrate improved coordination with pelvic floor and breathing mechanics for body weight squat without compensatory strategies for decreased strain at pelvic floor and improved mobility.  Baseline:  Goal status: INITIAL  4.  Pt to be I with abdominal massage, voiding and breathing mechanics for decreased straining for voiding.  Baseline:  Goal status: INITIAL   LONG TERM GOALS: Target date:  12/02/21    Pt to be I with  advanced HEP.  Baseline:  Goal status: INITIAL  2.  Pt will have 50% less urgency due to bladder retraining and strengthening  Baseline:  Goal status: INITIAL  3.  Pt will report her BMs are complete due to improved bowel habits and evacuation techniques.  Baseline:  Goal status: INITIAL  4.  Pt will report 50% less abdominal bloating and discomfort due to improved muscle tone throughout the core  Baseline:  Goal status: INITIAL  5.  Pt will report 7 BMs per day due to improved muscle tone and coordination with BMs.  Baseline: 20 Goal status: INITIAL   PLAN: PT FREQUENCY: every other week  PT DURATION:  6 sessions  PLANNED INTERVENTIONS: Therapeutic exercises, Therapeutic activity, Neuromuscular re-education, Patient/Family education, Self Care, Joint mobilization, Aquatic Therapy, Dry Needling, Spinal  mobilization, Cryotherapy, Moist heat, scar mobilization, Taping, Biofeedback, and Manual therapy  PLAN FOR NEXT SESSION: review handouts as needed, internal if needed and pt consents, abdominal massage/scar mobility, stretching hips/back/abdomen  Stacy Gardner, PT, DPT 07/28/238:45 AM

## 2021-09-02 ENCOUNTER — Other Ambulatory Visit: Payer: Self-pay

## 2021-09-05 ENCOUNTER — Encounter: Payer: 59 | Attending: Family Medicine | Admitting: Registered"

## 2021-09-05 ENCOUNTER — Encounter: Payer: Self-pay | Admitting: Registered"

## 2021-09-05 DIAGNOSIS — R634 Abnormal weight loss: Secondary | ICD-10-CM | POA: Insufficient documentation

## 2021-09-05 NOTE — Patient Instructions (Addendum)
-   Replace greek yogurt with whole milk version of greek yogurt.   - Try Breakfast Essentials with whole milk.   - Try to eat about every 3 hours.  11 am - breakfast - at least 3 food groups; yogurt + granola + fruit 2 pm - lunch - at least 3 food groups; wrap + [place] + cheese 5 pm - snack - Breakfast Essentials 8 pm - dinner - at least 3 food groups; chicken + squash/zucchini + potatoes 11 pm - snack - Breakfast Essentials  - Increase fluid intake to at least 64 oz a day.

## 2021-09-05 NOTE — Progress Notes (Signed)
Medical Nutrition Therapy  Appointment Start time:  8:20  Appointment End time:  9:15  Primary concerns today: wants a better idea of what would be best for her to eat  Referral diagnosis: recent weight loss Preferred learning style: no preference indicated Learning readiness: ready, change in progress   NUTRITION ASSESSMENT   Pt arrives stating she has been out of work since 02/2021 and returns next week. 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   States she has had a lot of issues with her colon. States she has lost weight as a result of being diagnosed with colon cancer. States her surgeon wants her to eat whatever and she knows she shouldn't. States she wants to know what is best for her to eat and what to stay away from. States she also wants to prevent more weight loss and doesn't want to gain excessive weight either.   States she ~170 lbs was her usual weight prior to being diagnosed with cancer. States she doesn't want to be less than 150 lbs.   States she doesn't take protonix daily and still experiences GERD with or without taking it. Reports constipation and feeling full often.   States she has had her ileostomoy reversed and since then bowel movements are clusters and every few minutes. States she was recommended to take Imodium which slows down BM but then she will need to take something to make her have BM.   States she ordered liquid IV to help stay hydrated. Reports eating watermelon to help increase hydration as well. States she has challenges drinking water although she wants to drink it, her taste buds have changed.    States yesterday she woke up around 9 am and ate 2 hours later at 11 am. States she does not have an appetite these days but forces herself to eat. States prior to diagnosis it was common for her to only eat 1 meal a day. States she prefers to snack throughout the day.  States she wants to know good, healthy snacks a day. States she doesn't eat 3 meals a day. States  she follows social group and follows their nutritional feedback which has encouraged her to stay away from fatty foods, nuts, raw fruits and vegetables, and sugary foods. States milk makes her have BM and she was using 2% milk with Frosted Flakes but stopped shortly after because it was no longer making her bowels move. States she used to eat oatmeal but has not tried it since recent changes with colon.   Prefers: seafood, chicken, greek yogurt, fruits, cooked veggies, wraps, plant-based items, crackers (sometimes)   Avoids: nuts, popcorn, fatty foods, seeds, raw veggies, sliced apples   Clinical Medical Hx: GERD, ileostomy reversed Medications: See list Labs:  Notable Signs/Symptoms: constipation  Lifestyle & Dietary Hx  Estimated daily fluid intake: 50 oz (1.5 L) Supplements: See list Sleep: 7 hrs/night Stress / self-care: read, pedicure (sometimes) Current average weekly physical activity: biking at home 15 min, 5 days/week  24-Hr Dietary Recall First Meal (11 am): cup of tea + greek yogurt  Snack:  Second Meal: skipped Snack:  Third Meal (6 pm): spinach wrap ([place], cheese) Snack (10 pm): watermelon Beverages: water (1 L), herbal tea, juice   NUTRITION DIAGNOSIS  NB-1.5 Disordered eating pattern As related to skipping meals.  As evidenced by dietary recall.   NUTRITION INTERVENTION  Nutrition education (E-1) on the following topics: Pt was educated and counseled on ways to increase nourishment, Rule of  3's, ways to incorporate Breakfast Essentials into pt's day, and meal/snack planning. Pt was educated and counseled on the benefits of eating throughout the day, metabolism, and eating to fuel the body. Discussed importance of physical activity and adequate fluid intake. Pt agreed with goals listed.  Handouts Provided Include  none  Learning Style & Readiness for Change Teaching method utilized: Visual & Auditory  Demonstrated degree of understanding via: Teach Back   Barriers to learning/adherence to lifestyle change: action stage of change  Goals Established by Pt Replace greek yogurt with whole milk version of greek yogurt.  Try Breakfast Essentials with whole milk.  Try to eat about every 3 hours.  11 am - breakfast - at least 3 food groups; yogurt + granola + fruit 2 pm - lunch - at least 3 food groups; wrap + [place] + cheese 5 pm - snack - Breakfast Essentials 8 pm - dinner - at least 3 food groups; chicken + squash/zucchini + potatoes 11 pm - snack - Breakfast Essentials Increase fluid intake to at least 64 oz a day.    MONITORING & EVALUATION Dietary intake, weekly physical activity.  Next Steps  Patient is to follow-up prn.

## 2021-09-06 DIAGNOSIS — L910 Hypertrophic scar: Secondary | ICD-10-CM | POA: Diagnosis not present

## 2021-09-08 DIAGNOSIS — C20 Malignant neoplasm of rectum: Secondary | ICD-10-CM | POA: Diagnosis not present

## 2021-09-11 ENCOUNTER — Ambulatory Visit (HOSPITAL_BASED_OUTPATIENT_CLINIC_OR_DEPARTMENT_OTHER): Payer: 59

## 2021-09-11 DIAGNOSIS — Z0000 Encounter for general adult medical examination without abnormal findings: Secondary | ICD-10-CM | POA: Diagnosis not present

## 2021-09-11 DIAGNOSIS — C20 Malignant neoplasm of rectum: Secondary | ICD-10-CM

## 2021-09-11 LAB — CBC WITH DIFFERENTIAL/PLATELET
Basophils Absolute: 0 10*3/uL (ref 0.0–0.2)
Basos: 0 %
EOS (ABSOLUTE): 0.1 10*3/uL (ref 0.0–0.4)
Eos: 2 %
Hematocrit: 33 % — ABNORMAL LOW (ref 34.0–46.6)
Hemoglobin: 10.6 g/dL — ABNORMAL LOW (ref 11.1–15.9)
Immature Grans (Abs): 0 10*3/uL (ref 0.0–0.1)
Immature Granulocytes: 0 %
Lymphocytes Absolute: 1.1 10*3/uL (ref 0.7–3.1)
Lymphs: 28 %
MCH: 26.1 pg — ABNORMAL LOW (ref 26.6–33.0)
MCHC: 32.1 g/dL (ref 31.5–35.7)
MCV: 81 fL (ref 79–97)
Monocytes Absolute: 0.4 10*3/uL (ref 0.1–0.9)
Monocytes: 11 %
Neutrophils Absolute: 2.4 10*3/uL (ref 1.4–7.0)
Neutrophils: 59 %
Platelets: 284 10*3/uL (ref 150–450)
RBC: 4.06 x10E6/uL (ref 3.77–5.28)
RDW: 15.3 % (ref 11.7–15.4)
WBC: 4.1 10*3/uL (ref 3.4–10.8)

## 2021-09-11 LAB — LIPID PANEL
Chol/HDL Ratio: 3.3 ratio (ref 0.0–4.4)
Cholesterol, Total: 175 mg/dL (ref 100–199)
HDL: 53 mg/dL (ref >39)
LDL Chol Calc (NIH): 110 mg/dL — ABNORMAL HIGH (ref 0–99)
Triglycerides: 63 mg/dL (ref 0–149)
VLDL Cholesterol Cal: 12 mg/dL (ref 5–40)

## 2021-09-12 LAB — GUARDANT 360

## 2021-09-15 ENCOUNTER — Telehealth: Payer: Self-pay | Admitting: *Deleted

## 2021-09-15 NOTE — Telephone Encounter (Signed)
Received FMLA papers today via fax. Left VM for her to call to clarify her request and also sent via Mychart.

## 2021-09-26 ENCOUNTER — Ambulatory Visit (INDEPENDENT_AMBULATORY_CARE_PROVIDER_SITE_OTHER): Payer: 59

## 2021-09-26 ENCOUNTER — Encounter: Payer: Self-pay | Admitting: Orthopaedic Surgery

## 2021-09-26 ENCOUNTER — Ambulatory Visit: Payer: 59 | Admitting: Orthopaedic Surgery

## 2021-09-26 ENCOUNTER — Other Ambulatory Visit (HOSPITAL_COMMUNITY): Payer: Self-pay

## 2021-09-26 DIAGNOSIS — M7062 Trochanteric bursitis, left hip: Secondary | ICD-10-CM

## 2021-09-26 DIAGNOSIS — M25552 Pain in left hip: Secondary | ICD-10-CM

## 2021-09-26 MED ORDER — METHYLPREDNISOLONE ACETATE 40 MG/ML IJ SUSP
40.0000 mg | INTRAMUSCULAR | Status: AC | PRN
  Administered 2021-09-26: 40 mg via INTRA_ARTICULAR

## 2021-09-26 MED ORDER — DICLOFENAC SODIUM 75 MG PO TBEC
75.0000 mg | DELAYED_RELEASE_TABLET | Freq: Two times a day (BID) | ORAL | 1 refills | Status: DC | PRN
  Filled 2021-09-26 (×2): qty 60, 30d supply, fill #0

## 2021-09-26 MED ORDER — LIDOCAINE HCL 1 % IJ SOLN
3.0000 mL | INTRAMUSCULAR | Status: AC | PRN
  Administered 2021-09-26: 3 mL

## 2021-09-26 MED ORDER — DICLOFENAC SODIUM 75 MG PO TBEC: 75.0000 mg | DELAYED_RELEASE_TABLET | Freq: Two times a day (BID) | ORAL | 1 refills | Status: DC | PRN

## 2021-09-26 NOTE — Addendum Note (Signed)
Addended by: Robyne Peers on: 09/26/2021 09:59 AM   Modules accepted: Orders

## 2021-09-26 NOTE — Progress Notes (Signed)
Office Visit Note   Patient: Sharon Bennett           Date of Birth: Jan 30, 1981           MRN: [id number] Visit Date: 09/26/2021              Requested by: de [place], Raymond J, MD [address] [place],  [place] [address] PCP: de [place], Raymond J, MD   Assessment & Plan: Visit Diagnoses:  1. Trochanteric bursitis, left hip   2. Pain in left hip     Plan: I would like to try steroid injection again her left hip trochanteric area today and she agreed to this and tolerated well.  I would also like to send her to outpatient physical therapy with any modalities per the therapist discretion for helping this improve including dry needling.  I will send in diclofenac as an anti-inflammatory and would like to see her back in 6 weeks.  If there is no improvement a MRI of that hip would be warranted at that standpoint.  She agrees with this treatment plan.  Follow-Up Instructions: Return in about 4 weeks (around 10/24/2021).   Orders:  Orders Placed This Encounter  Procedures   Large Joint Inj   XR HIP UNILAT W OR W/O PELVIS 1V LEFT   No orders of the defined types were placed in this encounter.     Procedures: Large Joint Inj: L greater trochanter on 09/26/2021 9:40 AM Indications: pain and diagnostic evaluation Details: 22 G 1.5 in needle, lateral approach  Arthrogram: No  Medications: 3 mL lidocaine 1 %; 40 mg methylPREDNISolone acetate 40 MG/ML Outcome: tolerated well, no immediate complications Procedure, treatment alternatives, risks and benefits explained, specific risks discussed. Consent was given by the patient. Immediately prior to procedure a time out was called to verify the correct patient, procedure, equipment, support staff and site/side marked as required. Patient was prepped and draped in the usual sterile fashion.       Clinical Data: No additional findings.   Subjective: Chief Complaint  Patient presents with   Left Hip - Follow-up  The patient is an  active 41 year old female that I seen before.  Several months ago we had diagnosed her with left hip trochanteric bursitis and provided a steroid injection around that area and showed her stretching exercises.  She said it really helped for about 3 to 4 weeks now she is hurting worse again and she cannot sleep on that side and it is all around the lateral aspect of her left hip where she hurts.  She denies any injury and denies any groin pain on the left side.  This is the same pain she has had before.  HPI  Review of Systems   Objective: Vital Signs: There were no vitals taken for this visit.  Physical Exam She is alert and orient x3 and in no acute distress Ortho Exam Examination of her left hip shows it moves smoothly and fluidly.  There is pain only over the trochanteric area and the proximal IT band. Specialty Comments:  No specialty comments available.  Imaging: XR HIP UNILAT W OR W/O PELVIS 1V LEFT  Result Date: 09/26/2021 An AP and lateral left hip shows no acute findings.  There is no cortical irregularities around the trochanteric area that corresponds with the patient's area of pain.  The hip joint appears normal.    PMFS History: Patient Active Problem List   Diagnosis Date Noted   Recent weight loss  08/22/2021   S/P closure of ileostomy 07/31/2021   Phase of life problem 06/19/2021   Tachycardia 05/22/2021   Elevated blood pressure reading 05/22/2021   Chest pain 04/26/2021   Epidural hematoma ([facility]) 04/19/2021   Abnormal cervical Papanicolaou smear 03/21/2021   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/21/2021   Bacteremia 12/15/2020   Elevated transaminase level 12/15/2020   Genetic testing 10/06/2020   Uterine fibroid 09/22/2020   Family history of pancreatic cancer 09/09/2020   Family history of brain cancer 09/09/2020   Family history of breast cancer 09/09/2020   Rectal cancer ([place]) 09/05/2020   Past Medical History:  Diagnosis Date   Anxiety    Bursitis    in hips  per pt   Colorectal cancer ([place])    Depression    Elevated blood pressure reading 05/22/2021   Family history of brain cancer    Family history of breast cancer    Family history of pancreatic cancer    GERD (gastroesophageal reflux disease)    PONV (postoperative nausea and vomiting)    Tachycardia 05/22/2021    Family History  Problem Relation Age of Onset   Hypertension Mother    Hypertension Father    Stroke Maternal Aunt    Heart attack Paternal Uncle    Heart failure Paternal Uncle    Pancreatic cancer Maternal Grandfather        dx 79s   Prostate cancer Maternal Grandfather    Brain cancer Paternal Grandmother        dx >50   Breast cancer Other 47       MGF's sister   Endometrial cancer Neg Hx    Ovarian cancer Neg Hx     Past Surgical History:  Procedure Laterality Date   FLEXIBLE SIGMOIDOSCOPY N/A 05/10/2021   Procedure: FLEXIBLE SIGMOIDOSCOPY;  Surgeon: Ileana Roup, MD;  [facility];  Service: General;  Laterality: N/A;   FLEXIBLE SIGMOIDOSCOPY N/A 07/19/2021   Procedure: Beryle Quant;  Surgeon: Ileana Roup, MD;  Location: [facility];  Service: General;  Laterality: N/A;   ILEOSTOMY CLOSURE N/A 07/31/2021   Procedure: OPEN ILEOSTOMY TAKEDOWN;  Surgeon: Ileana Roup, MD;  [facility];  Service: General;  Laterality: N/A;   IR IMAGING GUIDED PORT INSERTION  09/09/2020   IR REMOVAL TUN ACCESS W/ PORT W/O FL MOD SED  04/04/2021   LEEP     2015   POSTERIOR CERVICAL LAMINECTOMY N/A 04/19/2021   Procedure: CERVICAL LAMINECTOMY FOR EPIDURAL [place];  Surgeon: Earnie Larsson, MD;  Location: [facility];  Service: Neurosurgery;  Laterality: N/A;   XI ROBOTIC ASSISTED LOWER ANTERIOR RESECTION N/A 05/10/2021   Procedure: XI ROBOTIC ASSISTED LOWER ANTERIOR RESECTION WITH DIVERTING LOOP ILEOSTOMY, INTRAOPERATIVE PERFUSION ASSESSMENT WITH FIREFLY INJECTION;  Surgeon: Ileana Roup, MD;  [facility];  Service: General;  Laterality:  N/A;   Social History   Occupational History   Not on file  Tobacco Use   Smoking status: Never   Smokeless tobacco: Never  Vaping Use   Vaping Use: Never used  Substance and Sexual Activity   Alcohol use: Not Currently   Drug use: Never   Sexual activity: Not Currently    Birth control/protection: Pill

## 2021-09-27 ENCOUNTER — Encounter (HOSPITAL_BASED_OUTPATIENT_CLINIC_OR_DEPARTMENT_OTHER): Payer: 59 | Admitting: Family Medicine

## 2021-09-27 ENCOUNTER — Encounter (HOSPITAL_BASED_OUTPATIENT_CLINIC_OR_DEPARTMENT_OTHER): Payer: Self-pay

## 2021-09-27 DIAGNOSIS — K6289 Other specified diseases of anus and rectum: Secondary | ICD-10-CM | POA: Diagnosis not present

## 2021-09-27 DIAGNOSIS — K5900 Constipation, unspecified: Secondary | ICD-10-CM | POA: Diagnosis not present

## 2021-09-27 DIAGNOSIS — Z85048 Personal history of other malignant neoplasm of rectum, rectosigmoid junction, and anus: Secondary | ICD-10-CM | POA: Diagnosis not present

## 2021-10-02 ENCOUNTER — Ambulatory Visit: Payer: 59 | Attending: Oncology | Admitting: Physical Therapy

## 2021-10-02 DIAGNOSIS — M6281 Muscle weakness (generalized): Secondary | ICD-10-CM | POA: Diagnosis not present

## 2021-10-02 DIAGNOSIS — R293 Abnormal posture: Secondary | ICD-10-CM | POA: Diagnosis not present

## 2021-10-02 DIAGNOSIS — R279 Unspecified lack of coordination: Secondary | ICD-10-CM | POA: Diagnosis not present

## 2021-10-02 NOTE — Therapy (Signed)
OUTPATIENT PHYSICAL THERAPY FEMALE PELVIC EVALUATION   Patient Name: Sharon Bennett MRN: [id number] DOB:1980-10-16, 41 y.o., female Today's Date: 10/02/2021   PT End of Session - 10/02/21 1105     Visit Number 2    Date for PT Re-Evaluation 12/02/21    Authorization Type [facility]    PT Start Time 1103    PT Stop Time 1144    PT Time Calculation (min) 41 min    Activity Tolerance Patient tolerated treatment well    Behavior During Therapy [facility] for tasks assessed/performed             Past Medical History:  Diagnosis Date   Anxiety    Bursitis    in hips per pt   Colorectal cancer ([place])    Depression    Elevated blood pressure reading 05/22/2021   Family history of brain cancer    Family history of breast cancer    Family history of pancreatic cancer    GERD (gastroesophageal reflux disease)    PONV (postoperative nausea and vomiting)    Tachycardia 05/22/2021   Past Surgical History:  Procedure Laterality Date   FLEXIBLE SIGMOIDOSCOPY N/A 05/10/2021   Procedure: FLEXIBLE SIGMOIDOSCOPY;  Surgeon: Ileana Roup, MD;  Location: [facility];  Service: General;  Laterality: N/A;   FLEXIBLE SIGMOIDOSCOPY N/A 07/19/2021   Procedure: Beryle Quant;  Surgeon: Ileana Roup, MD;  Location: [facility];  Service: General;  Laterality: N/A;   ILEOSTOMY CLOSURE N/A 07/31/2021   Procedure: OPEN ILEOSTOMY TAKEDOWN;  Surgeon: Ileana Roup, MD;  Location: [facility];  Service: General;  Laterality: N/A;   IR IMAGING GUIDED PORT INSERTION  09/09/2020   IR REMOVAL TUN ACCESS W/ PORT W/O FL MOD SED  04/04/2021   LEEP     2015   POSTERIOR CERVICAL LAMINECTOMY N/A 04/19/2021   Procedure: CERVICAL LAMINECTOMY FOR EPIDURAL [place];  Surgeon: Earnie Larsson, MD;  Location: [place];  Service: Neurosurgery;  Laterality: N/A;   XI ROBOTIC ASSISTED LOWER ANTERIOR RESECTION N/A 05/10/2021   Procedure: XI ROBOTIC ASSISTED LOWER ANTERIOR RESECTION WITH DIVERTING LOOP ILEOSTOMY,  INTRAOPERATIVE PERFUSION ASSESSMENT WITH FIREFLY INJECTION;  Surgeon: Ileana Roup, MD;  Location: [facility];  Service: General;  Laterality: N/A;   Patient Active Problem List   Diagnosis Date Noted   Recent weight loss 08/22/2021   S/P closure of ileostomy 07/31/2021   Phase of life problem 06/19/2021   Tachycardia 05/22/2021   Elevated blood pressure reading 05/22/2021   Chest pain 04/26/2021   Epidural hematoma ([place]) 04/19/2021   Abnormal cervical Papanicolaou smear 03/21/2021   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/21/2021   Bacteremia 12/15/2020   Elevated transaminase level 12/15/2020   Genetic testing 10/06/2020   Uterine fibroid 09/22/2020   Family history of pancreatic cancer 09/09/2020   Family history of brain cancer 09/09/2020   Family history of breast cancer 09/09/2020   Rectal cancer ([place]) 09/05/2020    PCP: de [place], Raymond J, MD  REFERRING PROVIDER: Ladell Pier, MD   REFERRING DIAG:  C20 (ICD-10-CM) - Rectal cancer [facility])  THERAPY DIAG:  Muscle weakness (generalized)  Unspecified lack of coordination  Abnormal posture  Rationale for Evaluation and Treatment Rehabilitation  ONSET DATE: 07/31/21  SUBJECTIVE:  SUBJECTIVE STATEMENT: Pt reports she has been unable to schedule appointments due to other appointments and returning to work. Now cleared by MD to have enemas and now having BMs only on the days she does enemas and this is one large BM that day, no leakage between voids and no longer having high frequency. No pain or straining. However pt may start having urge to have BM after 1-2 days and will have small bowel movements that are soft but not fully emptying. Pt has returned to work and thinks the routine of this has helped with urgency of urine. Usually now at night  getting up 1-2x per night.   Fluid intake: Yes: 64oz of water per day; teas; apple juice/prune juice     PAIN:  Are you having pain? No   PRECAUTIONS: None  WEIGHT BEARING RESTRICTIONS No  FALLS:  Has patient fallen in last 6 months? 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  Mass was noncircumferential, measured 5 cm in length, 3 cm in diameter and was 7 cm from the anal verge., Chest CT 09/01/2020-no evidence of metastatic disease, MRI pelvis 09/02/2020-tumor at 10.4 cm from anal verge, 5.9 cm from the internal anal sphincter, T3, approximately 15 greater than 5 mm lymph nodes, N2b, multiple uterine fibroids, 8 cycles FOLFOX 09/15/20-01/23/21,  Radiation/Xeloda 01/23/2021-03/03/2021, . Frequency,urgency, incontinence of bowel movements. Pelvic rehab requested. Sexual abuse: No  BOWEL MOVEMENT Pain with bowel movement: Yes sometimes but not often Type of bowel movement:Type (Bristol Stool Scale) 6, Frequency 20x per day, and Strain Yes Fully empty rectum: No Leakage: No Pads: No Fiber supplement: Yes: was trying metamucil but has switched to citrucel  URINATION Pain with urination: No Fully empty bladder: No Stream:  not always strong, sometimes weak Urgency: Yes:   Frequency: not as bad during the day, at night she wakes sometimes every 30 minutes to empty at worst, at best every 3 hours  Leakage:  none  Pads: No  INTERCOURSE Pain with intercourse:  not active   PREGNANCY Vaginal deliveries 2 Tearing Yes: episiotomy with first  C-section deliveries 0 Currently pregnant  No  PROLAPSE None    OBJECTIVE:   DIAGNOSTIC FINDINGS:    COGNITION:  Overall cognitive status: Within functional limits for tasks assessed     SENSATION:  Light touch: Appears intact  Proprioception: Appears intact  MUSCLE LENGTH: Bil hamstrings and adductors limited by 25%               POSTURE: rounded shoulders, forward head, and posterior pelvic tilt    LUMBARAROM/PROM  A/PROM A/PROM  eval  Flexion Limited by 50%  Extension WFL  Right lateral flexion Limited by 25%  Left lateral flexion Limited by 25%  Right rotation Limited by 50%  Left rotation Limited by 50%   (Blank rows = not tested)  LOWER EXTREMITY ROM:  WFL  LOWER EXTREMITY MMT:  Bil hip abduction 3+/5, all other hip 4/5; ankles and knees 5/5   PALPATION:   General  no TTP but did have significant scar tissue build up with fascial restrictions at ostomy reversal scar site and surrounding lap scars.  External Perineal Exam pt deferred                              Internal Pelvic Floor deferred   Patient confirms identification and approves PT to assess internal pelvic floor and treatment No 10/02/21: Yes   PELVIC MMT:   MMT eval  Vaginal   Internal Anal Sphincter 5/5, however unable to palpate pas IAS due to increased tension  External Anal Sphincter 5/5, difficulty relaxing/bulging pelvic floor due to increased tension  Puborectalis   Diastasis Recti   (Blank rows = not tested)        TONE: Noted increased tone   PROLAPSE: Not seen in rectal assessment   TODAY'S TREATMENT  10/02/2021: Pt consented to internal rectal assessment today, did have increased tension throughout EAS. Gentle stretching provided in all directions at EAS and pt did have mild decreased tension without pain, on gloved digit able to palpate passed EAS slightly but unable to pass IAS due to increased tension, gentle stretching provided throughout within pt's tolerance discomfort reported at this  depth of 3/10, no deeper palpation attempted. Pt directed in diaphragmatic breathing throughout for improved relaxation with slight improvement. Pt also reeducated on balloon breathing technique for bowel movements as this did improve pt's ability to relax pelvic floor when tested today. Pt reports she is able to feel relaxation of pelvic floor with this technique as well.   PATIENT EDUCATION:  Education details: [id number] Person educated: Patient Education method: Explanation, Demonstration, Tactile cues, Verbal cues, and Handouts Education comprehension: verbalized understanding and returned demonstration   HOME EXERCISE PROGRAM: [id number]  ASSESSMENT:  CLINICAL IMPRESSION: Patient reports she is having less frequency and urgency with bowel movement since eval, MD has cleared her for self enemas now and this has drastically decreased symptoms. Pt does not have leakage but will begin having urge to have bowel movement (after large bowel movement post enema use) about 1-2 days after use and these bowel movements are smaller and more clustered per pt, type 2-3 and she doesn't feel complete afterward until next use of enema. Pt session focused on internal rectal assessment as pt consented to this, findings above. Pt would benefit from additional PT to further address deficits.     OBJECTIVE IMPAIRMENTS decreased coordination, decreased endurance, decreased mobility, decreased strength, increased fascial restrictions, increased muscle spasms, impaired flexibility, improper body mechanics, postural dysfunction, and pain.   ACTIVITY LIMITATIONS continence  PARTICIPATION LIMITATIONS: interpersonal relationship, community activity, and occupation  [place], Time since onset of injury/illness/exacerbation, and 1 comorbidity: medical history  are also affecting patient's functional outcome.   REHAB POTENTIAL: Good  CLINICAL DECISION MAKING: Stable/uncomplicated  EVALUATION  COMPLEXITY: Low   GOALS: Goals reviewed with patient? Yes  SHORT TERM GOALS: Target date: 09/29/2021  Pt to be I with HEP.  Baseline: Goal status: INITIAL  2.  Pt will have 25% less urgency due to bladder retraining and strengthening  Baseline:  Goal status: INITIAL  3.  Pt to demonstrate improved coordination with pelvic floor and breathing mechanics for body weight squat without compensatory strategies for decreased strain at pelvic floor and improved mobility.  Baseline:  Goal status: INITIAL  4.  Pt to be I with abdominal massage, voiding and breathing mechanics for decreased straining for voiding.  Baseline:  Goal status: INITIAL   LONG TERM GOALS: Target date:  12/02/21    Pt to be I with advanced HEP.  Baseline:  Goal status: INITIAL  2.  Pt will have 50% less urgency due to bladder retraining and strengthening  Baseline:  Goal status: INITIAL  3.  Pt will report her BMs are complete due to improved bowel habits and evacuation techniques.  Baseline:  Goal status: INITIAL  4.  Pt will report 50% less abdominal bloating and discomfort due to improved muscle tone throughout the core  Baseline:  Goal status: INITIAL  5.  Pt will report 7 BMs per day due to improved muscle tone and coordination with BMs.  Baseline: 20 Goal status: INITIAL   PLAN: PT FREQUENCY: every other week  PT DURATION:  6 sessions  PLANNED INTERVENTIONS: Therapeutic exercises, Therapeutic activity, Neuromuscular re-education, Patient/Family education, Self Care, Joint mobilization, Aquatic Therapy, Dry Needling, Spinal mobilization, Cryotherapy, Moist heat, scar mobilization, Taping, Biofeedback, and Manual therapy  PLAN FOR NEXT SESSION: review handouts as needed, internal if needed and pt consents, abdominal massage/scar mobility, stretching hips/back/abdomen  Stacy Gardner, PT, DPT 10/02/2309:46 AM

## 2021-10-10 ENCOUNTER — Other Ambulatory Visit: Payer: Self-pay | Admitting: Oncology

## 2021-10-10 DIAGNOSIS — C20 Malignant neoplasm of rectum: Secondary | ICD-10-CM

## 2021-10-16 ENCOUNTER — Ambulatory Visit: Payer: 59 | Admitting: Physical Therapy

## 2021-10-18 DIAGNOSIS — L910 Hypertrophic scar: Secondary | ICD-10-CM | POA: Diagnosis not present

## 2021-10-26 ENCOUNTER — Ambulatory Visit: Payer: 59 | Admitting: Orthopaedic Surgery

## 2021-10-30 ENCOUNTER — Other Ambulatory Visit: Payer: Self-pay | Admitting: Nurse Practitioner

## 2021-10-30 ENCOUNTER — Inpatient Hospital Stay: Payer: 59 | Attending: Oncology | Admitting: Nurse Practitioner

## 2021-10-30 ENCOUNTER — Encounter: Payer: Self-pay | Admitting: Nurse Practitioner

## 2021-10-30 ENCOUNTER — Inpatient Hospital Stay: Payer: 59

## 2021-10-30 ENCOUNTER — Ambulatory Visit: Payer: 59 | Attending: Oncology | Admitting: Physical Therapy

## 2021-10-30 ENCOUNTER — Other Ambulatory Visit (HOSPITAL_COMMUNITY): Payer: Self-pay

## 2021-10-30 VITALS — BP 119/92 | HR 80 | Temp 98.1°F | Resp 20 | Ht 66.0 in | Wt 147.0 lb

## 2021-10-30 DIAGNOSIS — C20 Malignant neoplasm of rectum: Secondary | ICD-10-CM

## 2021-10-30 DIAGNOSIS — M6281 Muscle weakness (generalized): Secondary | ICD-10-CM | POA: Diagnosis not present

## 2021-10-30 DIAGNOSIS — R279 Unspecified lack of coordination: Secondary | ICD-10-CM | POA: Insufficient documentation

## 2021-10-30 MED ORDER — CYCLOBENZAPRINE HCL 10 MG PO TABS
10.0000 mg | ORAL_TABLET | Freq: Three times a day (TID) | ORAL | 0 refills | Status: DC | PRN
  Filled 2021-10-30: qty 30, 10d supply, fill #0

## 2021-10-30 NOTE — Progress Notes (Signed)
[place] OFFICE PROGRESS NOTE   Diagnosis: Rectal cancer  INTERVAL HISTORY:   Sharon Bennett returns as scheduled.  She is able to have regular bowel movements if she performs a daily enema.  If she does not do the enema she has "clustering" of bowel movements.  She is scheduled for colonoscopy next week.  She continues to have pain at the left hip.  Notes improvement with injection for 4 to 5 weeks.  She returned to work in August.  Since then she notes worsening of neuropathy symptoms in her feet.  No numbness or tingling in the hands.  She continues gabapentin.  Objective:  Vital signs in last 24 hours:  Blood pressure (!) 119/92, pulse 80, temperature 98.1 F (36.7 C), temperature source Oral, resp. rate 20, height 5' 6"  (1.676 m), weight 147 lb (66.7 kg), SpO2 100 %.    Lymphatics: No palpable cervical, supraclavicular, axillary or inguinal lymph nodes. Resp: Lungs clear bilaterally. Cardio: Regular rate and rhythm. GI: Abdomen soft and nontender.  No hepatosplenomegaly. Vascular: No leg edema.  Lab Results:  Lab Results  Component Value Date   WBC 4.1 09/11/2021   HGB 10.6 (L) 09/11/2021   HCT 33.0 (L) 09/11/2021   MCV 81 09/11/2021   PLT 284 09/11/2021   NEUTROABS 2.4 09/11/2021    Imaging:  No results found.  Medications: I have reviewed the patient's current medications.  Assessment/Plan: Rectal cancer  CT abdomen/pelvis 08/21/2020-large rectal mass measuring 4.4 x 2.6 x 5.5 cm, haziness in the mesorectal fat, obscuration of the fat plane between the mass and the adjacent posterior aspect of the uterus, numerous prominent mesorectal lymph nodes measuring up to 5 mm Colonoscopy 08/23/2020-fungating infiltrative nonobstructing large mass found in the rectum.  Mass was noncircumferential, measured 5 cm in length, 3 cm in diameter and was 7 cm from the anal verge.  No bleeding was present.   Pathology showed invasive colonic adenocarcinoma, moderately  differentiated.  No evidence of mismatch repair deficiency. Chest CT 09/01/2020-no evidence of metastatic disease MRI pelvis 09/02/2020-tumor at 10.4 cm from anal verge, 5.9 cm from the internal anal sphincter, T3, approximately 15 greater than 5 mm lymph nodes, N2b, multiple uterine fibroids Cycle 1 FOLFOX 09/15/2020 Cycle 2 FOLFOX 09/29/2020 Cycle 3 FOLFOX 10/13/2020 Cycle 4 FOLFOX 10/27/2020 Cycle 5 FOLFOX 11/10/2020 Cycle 6 FOLFOX 11/24/2020 Cycle 7 FOLFOX 12/08/2020 Cycle 8 FOLFOX 01/05/2021, [place] Radiation/Xeloda 01/23/2021-03/03/2021 Robotic assisted low anterior resection/loop ileostomy 05/10/2021-invasive adenocarcinoma, tumor extends into muscularis, negative resection margins, 0/19 nodes, grade 2, no lymphovascular perineural invasion, normal mismatch repair protein expression,[id number] Ileostomy takedown 07/31/2021 CTs 08/25/2021-no evidence of metastatic disease 08/29/2021 guardant reveal-negative 2.   rectal bleeding, change in bowel habits secondary to #1 3.   Uterine fibroids noted on MRI pelvis 09/02/2020 4.   Port-A-Cath placement 09/09/2020, Interventional Radiology, Port-A-Cath removed 04/04/2021 5.  Admission 12/14/2020 with febrile neutropenia, E. coli bacteremia-completed an outpatient course of Augmentin 6.  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  Right upper extremity pain-03/31/2021 MRI cervical spine-no impingement, inflammation or mass to explain symptoms; disc bulging and ridging at C4-5 to C6-7.  C6-7 posterior cervical laminotomy and foraminotomy and microdiscectomy 04/17/2021 8.  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  She is scheduled for a surveillance colonoscopy next week.  She will return for a CEA and follow-up visit in 3 months.  [name] ANP/GNP-BC    10/30/2021  2:06 PM

## 2021-10-30 NOTE — Therapy (Addendum)
OUTPATIENT PHYSICAL THERAPY FEMALE PELVIC TREATMENT   Patient Name: Sharon Bennett MRN: [id number] DOB:1980/05/31, 41 y.o., female Today's Date: 10/30/2021   PT End of Session - 10/30/21 1106     Visit Number 3    Date for PT Re-Evaluation 12/02/21    Authorization Type [facility]    PT Start Time 1105   pt arrival time   PT Stop Time 1144    PT Time Calculation (min) 39 min    Activity Tolerance Patient tolerated treatment well    Behavior During Therapy [facility] for tasks assessed/performed             Past Medical History:  Diagnosis Date   Anxiety    Bursitis    in hips per pt   Colorectal cancer ([place])    Depression    Elevated blood pressure reading 05/22/2021   Family history of brain cancer    Family history of breast cancer    Family history of pancreatic cancer    GERD (gastroesophageal reflux disease)    PONV (postoperative nausea and vomiting)    Tachycardia 05/22/2021   Past Surgical History:  Procedure Laterality Date   FLEXIBLE SIGMOIDOSCOPY N/A 05/10/2021   Procedure: FLEXIBLE SIGMOIDOSCOPY;  Surgeon: Ileana Roup, MD;  Location: [facility];  Service: General;  Laterality: N/A;   FLEXIBLE SIGMOIDOSCOPY N/A 07/19/2021   Procedure: Beryle Quant;  Surgeon: Ileana Roup, MD;  Location: [facility];  Service: General;  Laterality: N/A;   ILEOSTOMY CLOSURE N/A 07/31/2021   Procedure: OPEN ILEOSTOMY TAKEDOWN;  Surgeon: Ileana Roup, MD;  Location: [facility];  Service: General;  Laterality: N/A;   IR IMAGING GUIDED PORT INSERTION  09/09/2020   IR REMOVAL TUN ACCESS W/ PORT W/O FL MOD SED  04/04/2021   LEEP     2015   POSTERIOR CERVICAL LAMINECTOMY N/A 04/19/2021   Procedure: CERVICAL LAMINECTOMY FOR EPIDURAL [place];  Surgeon: Earnie Larsson, MD;  Location: [facility];  Service: Neurosurgery;  Laterality: N/A;   XI ROBOTIC ASSISTED LOWER ANTERIOR RESECTION N/A 05/10/2021   Procedure: XI ROBOTIC ASSISTED LOWER ANTERIOR RESECTION WITH DIVERTING LOOP  ILEOSTOMY, INTRAOPERATIVE PERFUSION ASSESSMENT WITH FIREFLY INJECTION;  Surgeon: Ileana Roup, MD;  Location: [facility];  Service: General;  Laterality: N/A;   Patient Active Problem List   Diagnosis Date Noted   Recent weight loss 08/22/2021   S/P closure of ileostomy 07/31/2021   Phase of life problem 06/19/2021   Tachycardia 05/22/2021   Elevated blood pressure reading 05/22/2021   Chest pain 04/26/2021   Epidural hematoma ([place]) 04/19/2021   Abnormal cervical Papanicolaou smear 03/21/2021   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/21/2021   Bacteremia 12/15/2020   Elevated transaminase level 12/15/2020   Genetic testing 10/06/2020   Uterine fibroid 09/22/2020   Family history of pancreatic cancer 09/09/2020   Family history of brain cancer 09/09/2020   Family history of breast cancer 09/09/2020   Rectal cancer ([place]) 09/05/2020    PCP: de [place], Raymond J, MD  REFERRING PROVIDER: Ladell Pier, MD   REFERRING DIAG:  C20 (ICD-10-CM) - Rectal cancer [facility])  THERAPY DIAG:  Muscle weakness (generalized)  Unspecified lack of coordination  Rationale for Evaluation and Treatment Rehabilitation  ONSET DATE: 07/31/21  SUBJECTIVE:  SUBJECTIVE STATEMENT: Pt still using enemas for have bowel movements and states she usually has a large bowel movement on days with enema and no leakage unless she has dairy then will have multiple small pieces and less formed. Goes 24 hours between needs for enema and will have urge to have bowel movement but unable to pass stool.   Fluid intake: Yes: 64oz of water per day; teas; apple juice/prune juice     PAIN:  Are you having pain? No   PRECAUTIONS: None  WEIGHT BEARING RESTRICTIONS No  FALLS:  Has patient fallen in last 6 months? 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  Mass was noncircumferential, measured 5 cm in length, 3 cm in diameter and was 7 cm from the anal verge., Chest CT 09/01/2020-no evidence of metastatic disease, MRI pelvis 09/02/2020-tumor at 10.4 cm from anal verge, 5.9 cm from the internal anal sphincter, T3, approximately 15 greater than 5 mm lymph nodes, N2b, multiple uterine fibroids, 8 cycles FOLFOX 09/15/20-01/23/21,  Radiation/Xeloda 01/23/2021-03/03/2021, . Frequency,urgency, incontinence of bowel movements. Pelvic rehab requested. Sexual abuse: No  BOWEL MOVEMENT Pain with bowel movement: Yes sometimes but not often Type of bowel movement:Type (Bristol Stool Scale) 6, Frequency 20x per day, and Strain Yes Fully empty rectum: No Leakage: No Pads: No Fiber supplement: Yes: was trying metamucil but has switched to citrucel  URINATION Pain with urination: No Fully empty bladder: No Stream:  not always strong, sometimes weak Urgency: Yes:   Frequency: not as bad during the day, at night she wakes sometimes every 30 minutes to empty at worst, at best every 3 hours  Leakage:  none  Pads: No  INTERCOURSE Pain with intercourse:  not active   PREGNANCY Vaginal deliveries 2 Tearing Yes: episiotomy with first  C-section deliveries 0 Currently pregnant No  PROLAPSE None    OBJECTIVE:   DIAGNOSTIC FINDINGS:    COGNITION:  Overall cognitive status: Within functional limits for tasks assessed     SENSATION:  Light touch: Appears intact  Proprioception: Appears intact  MUSCLE LENGTH: Bil hamstrings and adductors limited by 25%                POSTURE: rounded shoulders, forward head, and posterior pelvic tilt    LUMBARAROM/PROM  A/PROM A/PROM  eval  Flexion Limited by 50%  Extension WFL  Right lateral flexion Limited by 25%  Left lateral flexion Limited by 25%  Right rotation Limited by 50%  Left rotation Limited by 50%   (Blank rows = not tested)  LOWER EXTREMITY ROM:  WFL  LOWER EXTREMITY MMT:  Bil hip abduction 3+/5, all other hip 4/5; ankles and knees 5/5   PALPATION:   General  no TTP but did have significant scar tissue build up with fascial restrictions at ostomy reversal scar site and surrounding lap scars.                 External Perineal Exam pt deferred                              Internal Pelvic Floor deferred   Patient confirms identification  and approves PT to assess internal pelvic floor and treatment No 10/02/21: Yes   PELVIC MMT:   MMT eval  Vaginal   Internal Anal Sphincter 5/5, however unable to palpate pas IAS due to increased tension  External Anal Sphincter 5/5, difficulty relaxing/bulging pelvic floor due to increased tension  Puborectalis   Diastasis Recti   (Blank rows = not tested)        TONE: Noted increased tone   PROLAPSE: Not seen in rectal assessment   TODAY'S TREATMENT  10/30/2021: Pt states has intermittent improvement with ability to pass stool with use of balloon breathing, squatty potty, and abdominal massage but not consistently and does still need enema to pass stool.   2x10 diaphragmatic breathing with tactile cues for abdominal mobility instead of chest breathing Butterfly stretch 3x30s Happy baby 3x30s Forward hip hinge 3x30s Seated happy baby 2x30s   PATIENT EDUCATION:  Education details: [place] educated: Patient Education method: Explanation, Demonstration, Tactile cues, Verbal cues, and Handouts Education comprehension: verbalized understanding and returned demonstration   HOME EXERCISE  PROGRAM: [id number]  ASSESSMENT:  CLINICAL IMPRESSION: Patient reports similar bowel habits to last session but now having more consistent urge to have bowel movement does attempt to empty but unable to pass stool without enema. Pt declined internal rectal treatment today as she took an enema today and doesn't want to have manual work today. Session focused on hip and back stretching for improved mobility at pelvis, low back and abdomen to decrease tension for improved mobility. Pt tolerated well, modifications made to HEP due to pt's discomfort with certain positions and pt declined pain with new modifications. Pt would benefit from additional PT to further address deficits.     OBJECTIVE IMPAIRMENTS decreased coordination, decreased endurance, decreased mobility, decreased strength, increased fascial restrictions, increased muscle spasms, impaired flexibility, improper body mechanics, postural dysfunction, and pain.   ACTIVITY LIMITATIONS continence  PARTICIPATION LIMITATIONS: interpersonal relationship, community activity, and occupation  [place], Time since onset of injury/illness/exacerbation, and 1 comorbidity: medical history  are also affecting patient's functional outcome.   REHAB POTENTIAL: Good  CLINICAL DECISION MAKING: Stable/uncomplicated  EVALUATION COMPLEXITY: Low   GOALS: Goals reviewed with patient? Yes  SHORT TERM GOALS: Target date: 09/29/2021  Pt to be I with HEP.  Baseline: Goal status: INITIAL  2.  Pt will have 25% less urgency due to bladder retraining and strengthening  Baseline:  Goal status: INITIAL  3.  Pt to demonstrate improved coordination with pelvic floor and breathing mechanics for body weight squat without compensatory strategies for decreased strain at pelvic floor and improved mobility.  Baseline:  Goal status: INITIAL  4.  Pt to be I with abdominal massage, voiding and breathing mechanics for decreased straining for voiding.   Baseline:  Goal status: INITIAL   LONG TERM GOALS: Target date:  12/02/21    Pt to be I with advanced HEP.  Baseline:  Goal status: INITIAL  2.  Pt will have 50% less urgency due to bladder retraining and strengthening  Baseline:  Goal status: INITIAL  3.  Pt will report her BMs are complete due to improved bowel habits and evacuation techniques.  Baseline:  Goal status: INITIAL  4.  Pt will report 50% less abdominal bloating and discomfort due to improved muscle tone throughout the core  Baseline:  Goal status: INITIAL  5.  Pt will report 7 BMs per day due to improved muscle tone and coordination with BMs.  Baseline: 20 Goal status:  INITIAL   PLAN: PT FREQUENCY: every other week  PT DURATION:  6 sessions  PLANNED INTERVENTIONS: Therapeutic exercises, Therapeutic activity, Neuromuscular re-education, Patient/Family education, Self Care, Joint mobilization, Aquatic Therapy, Dry Needling, Spinal mobilization, Cryotherapy, Moist heat, scar mobilization, Taping, Biofeedback, and Manual therapy  PLAN FOR NEXT SESSION: review handouts as needed, internal if needed and pt consents, abdominal massage/scar mobility, stretching hips/back/abdomen  Stacy Gardner, PT, DPT 10/30/2309:45 AM   PHYSICAL THERAPY DISCHARGE SUMMARY  Visits from Start of Care: 3  Current functional level related to goals / functional outcomes: Unable to formally reassess as pt has not been able to return since last appointment   Remaining deficits: Unable to formally reassess    Education / Equipment: HEP   Patient agrees to discharge. Patient goals were partially met. Patient is being discharged due to not returning since the last visit.  Stacy Gardner, PT, DPT 01/24/2310:23 AM

## 2021-10-31 ENCOUNTER — Ambulatory Visit (HOSPITAL_COMMUNITY): Payer: Self-pay

## 2021-10-31 ENCOUNTER — Telehealth: Payer: Self-pay

## 2021-10-31 NOTE — Telephone Encounter (Signed)
-----   Message from Owens Shark, NP sent at 10/30/2021  4:29 PM EDT ----- Please let her know [name] recommends repeating [phone] at the time of her next visit in 3 months.

## 2021-10-31 NOTE — Telephone Encounter (Signed)
Patient gave verbal understanding and had no further questions or concerns  

## 2021-11-01 ENCOUNTER — Telehealth: Payer: Self-pay | Admitting: Nurse Practitioner

## 2021-11-01 NOTE — Telephone Encounter (Signed)
Per los 9/26: Lab and Dr Chauncey Cruel in 3 months  Patient did not want to wait 3 months for lab so we inquire to lisa if patient could do lab next month per patient preference and lisa advised that it was fine.

## 2021-11-02 ENCOUNTER — Other Ambulatory Visit (HOSPITAL_COMMUNITY): Payer: Self-pay

## 2021-11-02 MED ORDER — SUTAB 1479-225-188 MG PO TABS
ORAL_TABLET | ORAL | 0 refills | Status: DC
  Filled 2021-11-02: qty 24, 1d supply, fill #0

## 2021-11-07 DIAGNOSIS — Z85038 Personal history of other malignant neoplasm of large intestine: Secondary | ICD-10-CM | POA: Diagnosis not present

## 2021-11-07 DIAGNOSIS — Z980 Intestinal bypass and anastomosis status: Secondary | ICD-10-CM | POA: Diagnosis not present

## 2021-11-07 DIAGNOSIS — Z85048 Personal history of other malignant neoplasm of rectum, rectosigmoid junction, and anus: Secondary | ICD-10-CM | POA: Diagnosis not present

## 2021-11-20 ENCOUNTER — Other Ambulatory Visit (HOSPITAL_COMMUNITY): Payer: Self-pay

## 2021-11-21 ENCOUNTER — Other Ambulatory Visit: Payer: Self-pay | Admitting: Orthopaedic Surgery

## 2021-11-29 ENCOUNTER — Inpatient Hospital Stay: Payer: 59 | Attending: Oncology

## 2021-11-29 DIAGNOSIS — C20 Malignant neoplasm of rectum: Secondary | ICD-10-CM | POA: Diagnosis not present

## 2021-11-29 DIAGNOSIS — L910 Hypertrophic scar: Secondary | ICD-10-CM | POA: Diagnosis not present

## 2021-11-29 LAB — CEA (ACCESS): CEA (CHCC): <1 ng/mL (ref 0.00–5.00)

## 2021-12-05 ENCOUNTER — Encounter: Payer: Self-pay | Admitting: Nurse Practitioner

## 2021-12-07 ENCOUNTER — Telehealth: Payer: Self-pay | Admitting: *Deleted

## 2021-12-07 DIAGNOSIS — C20 Malignant neoplasm of rectum: Secondary | ICD-10-CM | POA: Diagnosis not present

## 2021-12-07 NOTE — Telephone Encounter (Signed)
Called Sharon Bennett and informed her that Guardant DNA test was negative for cancer. Per [name]: no need for CT scan. She was appreciative of the call.

## 2021-12-08 LAB — GUARDANT 360

## 2021-12-26 ENCOUNTER — Encounter: Payer: Self-pay | Admitting: *Deleted

## 2022-01-05 DIAGNOSIS — C20 Malignant neoplasm of rectum: Secondary | ICD-10-CM | POA: Diagnosis not present

## 2022-01-08 ENCOUNTER — Other Ambulatory Visit: Payer: Self-pay | Admitting: Surgery

## 2022-01-08 DIAGNOSIS — C20 Malignant neoplasm of rectum: Secondary | ICD-10-CM

## 2022-01-12 DIAGNOSIS — N912 Amenorrhea, unspecified: Secondary | ICD-10-CM | POA: Diagnosis not present

## 2022-01-12 DIAGNOSIS — R87610 Atypical squamous cells of undetermined significance on cytologic smear of cervix (ASC-US): Secondary | ICD-10-CM | POA: Diagnosis not present

## 2022-01-12 DIAGNOSIS — Z01419 Encounter for gynecological examination (general) (routine) without abnormal findings: Secondary | ICD-10-CM | POA: Diagnosis not present

## 2022-01-12 DIAGNOSIS — Z6823 Body mass index (BMI) 23.0-23.9, adult: Secondary | ICD-10-CM | POA: Diagnosis not present

## 2022-01-12 DIAGNOSIS — Z1231 Encounter for screening mammogram for malignant neoplasm of breast: Secondary | ICD-10-CM | POA: Diagnosis not present

## 2022-01-17 ENCOUNTER — Ambulatory Visit
Admission: RE | Admit: 2022-01-17 | Discharge: 2022-01-17 | Disposition: A | Discharge status: Home / Self Care | Payer: 59 | Source: Ambulatory Visit | Attending: Surgery | Admitting: Surgery

## 2022-01-17 DIAGNOSIS — K5900 Constipation, unspecified: Secondary | ICD-10-CM | POA: Diagnosis not present

## 2022-01-17 DIAGNOSIS — D259 Leiomyoma of uterus, unspecified: Secondary | ICD-10-CM | POA: Diagnosis not present

## 2022-01-17 DIAGNOSIS — C20 Malignant neoplasm of rectum: Secondary | ICD-10-CM

## 2022-01-17 DIAGNOSIS — R140 Abdominal distension (gaseous): Secondary | ICD-10-CM | POA: Diagnosis not present

## 2022-01-17 MED ORDER — IOPAMIDOL (ISOVUE-370) INJECTION 76%
80.0000 mL | Freq: Once | INTRAVENOUS | Status: AC | PRN
  Administered 2022-01-17: 80 mL via INTRAVENOUS

## 2022-01-22 ENCOUNTER — Other Ambulatory Visit: Payer: Self-pay | Admitting: Oncology

## 2022-01-22 ENCOUNTER — Encounter: Payer: Self-pay | Admitting: Oncology

## 2022-01-22 ENCOUNTER — Other Ambulatory Visit (HOSPITAL_COMMUNITY): Payer: Self-pay

## 2022-01-22 ENCOUNTER — Other Ambulatory Visit: Payer: Self-pay | Admitting: Nurse Practitioner

## 2022-01-22 DIAGNOSIS — C20 Malignant neoplasm of rectum: Secondary | ICD-10-CM

## 2022-01-22 MED ORDER — GABAPENTIN 300 MG PO CAPS
300.0000 mg | ORAL_CAPSULE | Freq: Two times a day (BID) | ORAL | 1 refills | Status: DC
  Filled 2022-01-22: qty 60, 30d supply, fill #0

## 2022-01-22 MED ORDER — CYCLOBENZAPRINE HCL 10 MG PO TABS
10.0000 mg | ORAL_TABLET | Freq: Three times a day (TID) | ORAL | 0 refills | Status: DC | PRN
  Filled 2022-01-22: qty 30, 10d supply, fill #0

## 2022-01-26 ENCOUNTER — Inpatient Hospital Stay: Payer: 59 | Attending: Oncology | Admitting: Nurse Practitioner

## 2022-01-26 ENCOUNTER — Other Ambulatory Visit (HOSPITAL_COMMUNITY): Payer: Self-pay

## 2022-01-26 ENCOUNTER — Inpatient Hospital Stay: Payer: 59

## 2022-01-26 ENCOUNTER — Encounter: Payer: Self-pay | Admitting: Nurse Practitioner

## 2022-01-26 ENCOUNTER — Telehealth: Payer: Self-pay

## 2022-01-26 VITALS — BP 119/83 | HR 90 | Temp 98.2°F | Resp 18 | Ht 66.0 in | Wt 141.0 lb

## 2022-01-26 DIAGNOSIS — T451X5A Adverse effect of antineoplastic and immunosuppressive drugs, initial encounter: Secondary | ICD-10-CM | POA: Insufficient documentation

## 2022-01-26 DIAGNOSIS — G620 Drug-induced polyneuropathy: Secondary | ICD-10-CM | POA: Diagnosis not present

## 2022-01-26 DIAGNOSIS — C20 Malignant neoplasm of rectum: Secondary | ICD-10-CM | POA: Diagnosis not present

## 2022-01-26 DIAGNOSIS — Z79899 Other long term (current) drug therapy: Secondary | ICD-10-CM | POA: Diagnosis not present

## 2022-01-26 DIAGNOSIS — R194 Change in bowel habit: Secondary | ICD-10-CM | POA: Insufficient documentation

## 2022-01-26 DIAGNOSIS — R5383 Other fatigue: Secondary | ICD-10-CM | POA: Diagnosis not present

## 2022-01-26 LAB — CBC WITH DIFFERENTIAL (CANCER CENTER ONLY)
Abs Immature Granulocytes: 0 10*3/uL (ref 0.00–0.07)
Basophils Absolute: 0 10*3/uL (ref 0.0–0.1)
Basophils Relative: 1 %
Eosinophils Absolute: 0.1 10*3/uL (ref 0.0–0.5)
Eosinophils Relative: 3 %
HCT: 35.8 % — ABNORMAL LOW (ref 36.0–46.0)
Hemoglobin: 11.3 g/dL — ABNORMAL LOW (ref 12.0–15.0)
Immature Granulocytes: 0 %
Lymphocytes Relative: 28 %
Lymphs Abs: 0.9 10*3/uL (ref 0.7–4.0)
MCH: 26.8 pg (ref 26.0–34.0)
MCHC: 31.6 g/dL (ref 30.0–36.0)
MCV: 84.8 fL (ref 80.0–100.0)
Monocytes Absolute: 0.4 10*3/uL (ref 0.1–1.0)
Monocytes Relative: 12 %
Neutro Abs: 1.8 10*3/uL (ref 1.7–7.7)
Neutrophils Relative %: 56 %
Platelet Count: 242 10*3/uL (ref 150–400)
RBC: 4.22 MIL/uL (ref 3.87–5.11)
RDW: 15 % (ref 11.5–15.5)
WBC Count: 3.2 10*3/uL — ABNORMAL LOW (ref 4.0–10.5)
nRBC: 0 % (ref 0.0–0.2)

## 2022-01-26 MED ORDER — AMITRIPTYLINE HCL 25 MG PO TABS
25.0000 mg | ORAL_TABLET | Freq: Every day | ORAL | 1 refills | Status: DC
  Filled 2022-01-26: qty 30, 30d supply, fill #0
  Filled 2022-02-20: qty 30, 30d supply, fill #1

## 2022-01-26 NOTE — Progress Notes (Addendum)
[place] OFFICE PROGRESS NOTE   Diagnosis: Rectal cancer  INTERVAL HISTORY:   Ms. Tafolla returns as scheduled.  She continues a daily enema to keep bowels regular.  Certain foods will cause a change in bowel habits.  She watches her diet closely.  She reports a recent colonoscopy was negative.  No bloody or black stools.  She has periodic abdominal pain.  She has recently had left neck/right shoulder pain.  She has persistent tingling in the feet, painful at bedtime.  She takes gabapentin 300 mg at bedtime.  She is fatigued.  She wonders if her vitamin D or B12 level could be low.  She is concerned she is depressed.  Objective:  Vital signs in last 24 hours:  Blood pressure 119/83, pulse 90, temperature 98.2 F (36.8 C), temperature source Oral, resp. rate 18, height _0  (1.676 m), weight 141 lb (64 kg), SpO2 100 %.    Lymphatics: No palpable cervical, supraclavicular, axillary or inguinal lymph nodes. Resp: Lungs clear bilaterally. Cardio: Regular rate and rhythm. GI: Abdomen soft and nontender.  No hepatomegaly.  No mass. Vascular: No leg edema. Neuro: Upper extremity strength intact.   Lab Results:  Lab Results  Component Value Date   WBC 4.1 09/11/2021   HGB 10.6 (L) 09/11/2021   HCT 33.0 (L) 09/11/2021   MCV 81 09/11/2021   PLT 284 09/11/2021   NEUTROABS 2.4 09/11/2021    Imaging:  No results found.  Medications: I have reviewed the patient's current medications.  Assessment/Plan: Rectal cancer  CT abdomen/pelvis 08/21/2020-large rectal mass measuring 4.4 x 2.6 x 5.5 cm, haziness in the mesorectal fat, obscuration of the fat plane between the mass and the adjacent posterior aspect of the uterus, numerous prominent mesorectal lymph nodes measuring up to 5 mm Colonoscopy 08/23/2020-fungating infiltrative nonobstructing large mass found in the rectum.  Mass was noncircumferential, measured 5 cm in length, 3 cm in diameter and was 7 cm from the anal  verge.  No bleeding was present.   Pathology showed invasive colonic adenocarcinoma, moderately differentiated.  No evidence of mismatch repair deficiency. Chest CT 09/01/2020-no evidence of metastatic disease MRI pelvis 09/02/2020-tumor at 10.4 cm from anal verge, 5.9 cm from the internal anal sphincter, T3, approximately 15 greater than 5 mm lymph nodes, N2b, multiple uterine fibroids Cycle 1 FOLFOX 09/15/2020 Cycle 2 FOLFOX 09/29/2020 Cycle 3 FOLFOX 10/13/2020 Cycle 4 FOLFOX 10/27/2020 Cycle 5 FOLFOX 11/10/2020 Cycle 6 FOLFOX 11/24/2020 Cycle 7 FOLFOX 12/08/2020 Cycle 8 FOLFOX 01/05/2021, [facility] Radiation/Xeloda 01/23/2021-03/03/2021 Robotic assisted low anterior resection/loop ileostomy 05/10/2021-invasive adenocarcinoma, tumor extends into muscularis, negative resection margins, 0/19 nodes, grade 2, no lymphovascular perineural invasion, normal mismatch repair protein expression,[id number] Ileostomy takedown 07/31/2021 CTs 08/25/2021-no evidence of metastatic disease 08/29/2021 guardant reveal-negative 11/29/2021 guardant reveal negative CTs 01/17/2022-no findings of recurrent or metastatic disease, 7 mm right liver lesion noted in the body of the report, clarification being requested. 2.   rectal bleeding, change in bowel habits secondary to #1 3.   Uterine fibroids noted on MRI pelvis 09/02/2020 4.   Port-A-Cath placement 09/09/2020, Interventional Radiology, Port-A-Cath removed 04/04/2021 5.  Admission 12/14/2020 with febrile neutropenia, E. coli bacteremia-completed an outpatient course of Augmentin 6.  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  Right upper extremity pain-03/31/2021 MRI cervical spine-no impingement, inflammation or mass to explain symptoms; disc bulging and ridging at C4-5 to C6-7.  C6-7 posterior cervical laminotomy and foraminotomy and microdiscectomy 04/17/2021 8.  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 Crusoe remains in clinical remission from colon cancer.  Recent circulating tumor DNA test was negative.  She had CT scans 01/17/2022 with no evidence of metastatic disease reported.  In the body of the report it is noted there is a 7 mm hypoattenuating right liver lesion.  CT images reviewed and no liver lesion seen.  We will contact radiology for clarification and an addendum.  Plan for CEA at her next visit in 3 months.  She has persistent neuropathy symptoms in the feet.  She will discontinue gabapentin due to lack of benefit and begin amitriptyline 25 mg daily.  The amitriptyline may help with her depression as well.  She will follow-up with [name] if the left neck/shoulder pain persist.  She will return for a follow-up visit and CEA in 3 months.  She will contact the office in the interim with any problems.  Patient seen with [name].    [name] ANP/GNP-BC   01/26/2022  12:34 PM This was a shared visit with [name].  Ms. Shroff was interviewed and examined.  She is in clinical remission from active cancer.  We reviewed the recent CT findings and images with her.  I cannot appreciate a lesion in the right liver.  We will ask radiology for further clarification regarding the reported liver lesion.  She continues to have irregular bowel habits.  MiraLAX has not helped.  She is using an enema daily.  I will ask Dr. Dema Severin if he has other ideas to help with the bowel movements.  She has persistent symptoms of oxaliplatin neuropathy.  She was unable to tolerate Cymbalta.  Gabapentin has not helped significantly.  She will begin a trial of amitriptyline.  We reviewed potential toxicities associated with amitriptyline.  I was present for greater than 50% today's visit.  I performed medical stage  making.  [name], MD

## 2022-01-26 NOTE — Telephone Encounter (Signed)
Called radiology and requested an addendum.

## 2022-01-31 ENCOUNTER — Encounter: Payer: Self-pay | Admitting: Nurse Practitioner

## 2022-02-01 ENCOUNTER — Other Ambulatory Visit: Payer: Self-pay | Admitting: *Deleted

## 2022-02-01 DIAGNOSIS — C20 Malignant neoplasm of rectum: Secondary | ICD-10-CM

## 2022-02-01 NOTE — Progress Notes (Signed)
Dr. Dema Severin and [name] believe that pelvic rehab could help her bowel evacuation issues. Mrs. Sharon Bennett agrees to this. Referral order placed.

## 2022-02-13 NOTE — Therapy (Unsigned)
OUTPATIENT PHYSICAL THERAPY FEMALE PELVIC EVALUATION   Patient Name: Sharon Bennett MRN: [id number] DOB:17-Dec-1980, 42 y.o., female Today's Date: 02/13/2022  END OF SESSION:   Past Medical History:  Diagnosis Date   Anxiety    Bursitis    in hips per pt   Colorectal cancer ([place])    Depression    Elevated blood pressure reading 05/22/2021   Family history of brain cancer    Family history of breast cancer    Family history of pancreatic cancer    GERD (gastroesophageal reflux disease)    PONV (postoperative nausea and vomiting)    Tachycardia 05/22/2021   Past Surgical History:  Procedure Laterality Date   FLEXIBLE SIGMOIDOSCOPY N/A 05/10/2021   Procedure: FLEXIBLE SIGMOIDOSCOPY;  Surgeon: Ileana Roup, MD;  Location: [facility];  Service: General;  Laterality: N/A;   FLEXIBLE SIGMOIDOSCOPY N/A 07/19/2021   Procedure: Beryle Quant;  Surgeon: Ileana Roup, MD;  Location: [facility];  Service: General;  Laterality: N/A;   ILEOSTOMY CLOSURE N/A 07/31/2021   Procedure: OPEN ILEOSTOMY TAKEDOWN;  Surgeon: Ileana Roup, MD;  Location: [facility];  Service: General;  Laterality: N/A;   IR IMAGING GUIDED PORT INSERTION  09/09/2020   IR REMOVAL TUN ACCESS W/ PORT W/O FL MOD SED  04/04/2021   LEEP     2015   POSTERIOR CERVICAL LAMINECTOMY N/A 04/19/2021   Procedure: CERVICAL LAMINECTOMY FOR EPIDURAL [place];  Surgeon: Earnie Larsson, MD;  Location: [facility];  Service: Neurosurgery;  Laterality: N/A;   XI ROBOTIC ASSISTED LOWER ANTERIOR RESECTION N/A 05/10/2021   Procedure: XI ROBOTIC ASSISTED LOWER ANTERIOR RESECTION WITH DIVERTING LOOP ILEOSTOMY, INTRAOPERATIVE PERFUSION ASSESSMENT WITH FIREFLY INJECTION;  Surgeon: Ileana Roup, MD;  Location: [facility];  Service: General;  Laterality: N/A;   Patient Active Problem List   Diagnosis Date Noted   Recent weight loss 08/22/2021   S/P closure of ileostomy 07/31/2021   Phase of life problem 06/19/2021   Tachycardia  05/22/2021   Elevated blood pressure reading 05/22/2021   Chest pain 04/26/2021   Epidural hematoma ([place]) 04/19/2021   Abnormal cervical Papanicolaou smear 03/21/2021   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/21/2021   Bacteremia 12/15/2020   Elevated transaminase level 12/15/2020   Genetic testing 10/06/2020   Uterine fibroid 09/22/2020   Family history of pancreatic cancer 09/09/2020   Family history of brain cancer 09/09/2020   Family history of breast cancer 09/09/2020   Rectal cancer ([place]) 09/05/2020    PCP: de [place], Raymond J, MD  REFERRING PROVIDER: Ladell Pier, MD   REFERRING DIAG: C20 (ICD-10-CM) - Rectal cancer [facility])   THERAPY DIAG:  No diagnosis found.  Rationale for Evaluation and Treatment: Rehabilitation  ONSET DATE: ***  SUBJECTIVE:  SUBJECTIVE STATEMENT: Had PT with Hildred Alamin prior to treatments Fluid intake: {Yes/No:304960894}   PAIN:  Are you having pain? {yes/no:20286} NPRS scale: ***/10 Pain location: {pelvic pain location:27098}  Pain type: {type:313116} Pain description: {PAIN DESCRIPTION:21022940}   Aggravating factors: *** Relieving factors: ***  PRECAUTIONS: Other: rectal cancer  WEIGHT BEARING RESTRICTIONS: No  FALLS:  Has patient fallen in last 6 months? {fallsyesno:27318}  LIVING ENVIRONMENT: Lives with: {[facility] lives with:25569::"lives with their family"} Lives in: {Lives in:25570} Stairs: {opstairs:27293} Has following equipment at home: {Assistive devices:23999}  OCCUPATION: ***  PLOF: {PLOF:24004}  PATIENT GOALS: ***  PERTINENT HISTORY:  Ileostomy closure 07/31/21; Leep 2015; Posterior cervical laminectomy 04/19/21; XI Robotic Assisted Lower Anterior Resection 05/10/21; Rectal cancer with chemotherapy from 09/15/2020-01/05/2021, radiation / Xeloda  01/23/21-03/03/21 Sexual abuse: {Yes/No:304960894}  BOWEL MOVEMENT: Pain with bowel movement: {yes/no:20286} Type of bowel movement:{PT BM type:27100} Fully empty rectum: {Yes/No:304960894} Leakage: {Yes/No:304960894} Pads: {Yes/No:304960894} Fiber supplement: {Yes/No:304960894}  URINATION: Pain with urination: {yes/no:20286} Fully empty bladder: {Yes/No:304960894} Stream: {PT urination:27102} Urgency: {Yes/No:304960894} Frequency: *** Leakage: {PT leakage:27103} Pads: {Yes/No:304960894}  INTERCOURSE: Pain with intercourse: {pain with intercourse PA:27099} Ability to have vaginal penetration:  {Yes/No:304960894} Climax: *** Marinoff Scale: ***/3  PREGNANCY: Vaginal deliveries *** Tearing {Yes***/No:304960894} C-section deliveries *** Currently pregnant {Yes***/No:304960894}  PROLAPSE: {PT prolapse:27101}   OBJECTIVE:   DIAGNOSTIC FINDINGS:  ***  PATIENT SURVEYS:  {rehab surveys:24030}  PFIQ-7 ***  COGNITION: Overall cognitive status: {cognition:24006}     SENSATION: Light touch: {intact/deficits:24005} Proprioception: {intact/deficits:24005}  MUSCLE LENGTH: Hamstrings: Right *** deg; Left *** deg Thomas test: Right *** deg; Left *** deg  LUMBAR SPECIAL TESTS:  {lumbar special test:25242}  FUNCTIONAL TESTS:  {Functional tests:24029}  GAIT: Distance walked: *** Assistive device utilized: {Assistive devices:23999} Level of assistance: {Levels of assistance:24026} Comments: ***  POSTURE: {posture:25561}  PELVIC ALIGNMENT:  LUMBARAROM/PROM:  A/PROM A/PROM  eval  Flexion   Extension   Right lateral flexion   Left lateral flexion   Right rotation   Left rotation    (Blank rows = not tested)  LOWER EXTREMITY ROM:  {AROM/PROM:27142} ROM Right eval Left eval  Hip flexion    Hip extension    Hip abduction    Hip adduction    Hip internal rotation    Hip external rotation    Knee flexion    Knee extension    Ankle dorsiflexion     Ankle plantarflexion    Ankle inversion    Ankle eversion     (Blank rows = not tested)  LOWER EXTREMITY MMT:  MMT Right eval Left eval  Hip flexion    Hip extension    Hip abduction    Hip adduction    Hip internal rotation    Hip external rotation    Knee flexion    Knee extension    Ankle dorsiflexion    Ankle plantarflexion    Ankle inversion    Ankle eversion     PALPATION:   General  ***                External Perineal Exam ***                             Internal Pelvic Floor ***  Patient confirms identification and approves PT to assess internal pelvic floor and treatment {yes/no:20286}  PELVIC MMT:   MMT eval  Vaginal   Internal Anal Sphincter   External Anal Sphincter   Puborectalis   Diastasis Recti   (  Blank rows = not tested)        TONE: ***  PROLAPSE: ***  TODAY'S TREATMENT:                                                                                                                              DATE: ***  EVAL ***   PATIENT EDUCATION:  Education details: *** Person educated: {Person educated:25204} Education method: {Education Method:25205} Education comprehension: {Education Comprehension:25206}  HOME EXERCISE PROGRAM: ***  ASSESSMENT:  CLINICAL IMPRESSION: Patient is a *** y.o. *** who was seen today for physical therapy evaluation and treatment for ***.   OBJECTIVE IMPAIRMENTS: {opptimpairments:25111}.   ACTIVITY LIMITATIONS: {activitylimitations:27494}  PARTICIPATION LIMITATIONS: {participationrestrictions:25113}  PERSONAL FACTORS: {Personal factors:25162} are also affecting patient's functional outcome.   REHAB POTENTIAL: {rehabpotential:25112}  CLINICAL DECISION MAKING: {clinical decision making:25114}  EVALUATION COMPLEXITY: {Evaluation complexity:25115}   GOALS: Goals reviewed with patient? {yes/no:20286}  SHORT TERM GOALS: Target date: ***  *** Baseline: Goal status: {GOALSTATUS:25110}  2.   *** Baseline:  Goal status: {GOALSTATUS:25110}  3.  *** Baseline:  Goal status: {GOALSTATUS:25110}  4.  *** Baseline:  Goal status: {GOALSTATUS:25110}  5.  *** Baseline:  Goal status: {GOALSTATUS:25110}  6.  *** Baseline:  Goal status: {GOALSTATUS:25110}  LONG TERM GOALS: Target date: ***  *** Baseline:  Goal status: {GOALSTATUS:25110}  2.  *** Baseline:  Goal status: {GOALSTATUS:25110}  3.  *** Baseline:  Goal status: {GOALSTATUS:25110}  4.  *** Baseline:  Goal status: {GOALSTATUS:25110}  5.  *** Baseline:  Goal status: {GOALSTATUS:25110}  6.  *** Baseline:  Goal status: {GOALSTATUS:25110}  PLAN:  PT FREQUENCY: {rehab frequency:25116}  PT DURATION: {rehab duration:25117}  PLANNED INTERVENTIONS: {rehab planned interventions:25118::"Therapeutic exercises","Therapeutic activity","Neuromuscular re-education","Balance training","Gait training","Patient/Family education","Self Care","Joint mobilization"}  PLAN FOR NEXT SESSION: ***   Sameka Bagent, PT 02/13/2022, 8:33 AM

## 2022-02-14 ENCOUNTER — Other Ambulatory Visit: Payer: Self-pay

## 2022-02-14 ENCOUNTER — Ambulatory Visit: Payer: Commercial Managed Care - PPO | Attending: Surgery | Admitting: Physical Therapy

## 2022-02-14 ENCOUNTER — Encounter: Payer: Self-pay | Admitting: Physical Therapy

## 2022-02-14 DIAGNOSIS — Z483 Aftercare following surgery for neoplasm: Secondary | ICD-10-CM | POA: Insufficient documentation

## 2022-02-14 DIAGNOSIS — R279 Unspecified lack of coordination: Secondary | ICD-10-CM | POA: Diagnosis not present

## 2022-02-14 DIAGNOSIS — M6281 Muscle weakness (generalized): Secondary | ICD-10-CM | POA: Diagnosis not present

## 2022-02-23 DIAGNOSIS — R000 Tachycardia, unspecified: Secondary | ICD-10-CM | POA: Diagnosis not present

## 2022-02-23 DIAGNOSIS — Z131 Encounter for screening for diabetes mellitus: Secondary | ICD-10-CM | POA: Diagnosis not present

## 2022-02-23 DIAGNOSIS — H5789 Other specified disorders of eye and adnexa: Secondary | ICD-10-CM | POA: Diagnosis not present

## 2022-02-23 DIAGNOSIS — Z0000 Encounter for general adult medical examination without abnormal findings: Secondary | ICD-10-CM | POA: Diagnosis not present

## 2022-03-15 ENCOUNTER — Encounter: Payer: Commercial Managed Care - PPO | Admitting: Physical Therapy

## 2022-03-20 DIAGNOSIS — Z131 Encounter for screening for diabetes mellitus: Secondary | ICD-10-CM | POA: Diagnosis not present

## 2022-03-30 ENCOUNTER — Other Ambulatory Visit: Payer: Self-pay | Admitting: Nurse Practitioner

## 2022-03-30 DIAGNOSIS — C20 Malignant neoplasm of rectum: Secondary | ICD-10-CM

## 2022-04-02 ENCOUNTER — Other Ambulatory Visit (HOSPITAL_COMMUNITY): Payer: Self-pay

## 2022-04-02 MED ORDER — AMITRIPTYLINE HCL 25 MG PO TABS
25.0000 mg | ORAL_TABLET | Freq: Every day | ORAL | 2 refills | Status: DC
  Filled 2022-04-02: qty 30, 30d supply, fill #0
  Filled 2022-05-09: qty 30, 30d supply, fill #1
  Filled 2022-05-14: qty 30, 30d supply, fill #0
  Filled 2022-06-11: qty 30, 30d supply, fill #1

## 2022-04-27 ENCOUNTER — Inpatient Hospital Stay (HOSPITAL_BASED_OUTPATIENT_CLINIC_OR_DEPARTMENT_OTHER): Payer: Commercial Managed Care - PPO | Admitting: Oncology

## 2022-04-27 ENCOUNTER — Inpatient Hospital Stay: Payer: Commercial Managed Care - PPO | Attending: Oncology

## 2022-04-27 ENCOUNTER — Other Ambulatory Visit: Payer: Self-pay | Admitting: *Deleted

## 2022-04-27 VITALS — BP 114/77 | HR 86 | Temp 97.9°F | Resp 18 | Ht 66.0 in | Wt 138.2 lb

## 2022-04-27 DIAGNOSIS — C20 Malignant neoplasm of rectum: Secondary | ICD-10-CM

## 2022-04-27 DIAGNOSIS — Z79899 Other long term (current) drug therapy: Secondary | ICD-10-CM | POA: Insufficient documentation

## 2022-04-27 LAB — CEA (ACCESS): CEA (CHCC): <1 ng/mL (ref 0.00–5.00)

## 2022-04-27 NOTE — Progress Notes (Addendum)
[place] OFFICE PROGRESS NOTE   Diagnosis: Rectal cancer  INTERVAL HISTORY:   Ms. Gotts returns as scheduled.  She continues to have numbness in the lower extremities.  This did not improve with amitriptyline, but her mood has improved.  She reports better rectal control.  She does not have "cluster" of stools when she follows a lactose-free diet.  She is working full-time.  Objective:  Vital signs in last 24 hours:  Blood pressure 114/77, pulse 86, temperature 97.9 F (36.6 C), temperature source Oral, resp. rate 18, height 5\' 6"  (1.676 m), weight 138 lb 3.2 oz (62.7 kg), SpO2 100 %.     Lymphatics: No cervical, supraclavicular, or inguinal nodes.  "Shotty "right greater than left axillary nodes Resp: Clear bilaterally Cardio: Regular rate and rhythm GI: No mass, nontender, no hepatosplenomegaly Vascular: No leg edema   Lab Results:  Lab Results  Component Value Date   WBC 3.2 (L) 01/26/2022   HGB 11.3 (L) 01/26/2022   HCT 35.8 (L) 01/26/2022   MCV 84.8 01/26/2022   PLT 242 01/26/2022   NEUTROABS 1.8 01/26/2022    CMP  Lab Results  Component Value Date   NA 142 08/29/2021   K 4.1 08/29/2021   CL 101 08/29/2021   CO2 28 08/29/2021   GLUCOSE 89 08/29/2021   BUN 7 08/29/2021   CREATININE 0.67 08/29/2021   CALCIUM 10.1 08/29/2021   PROT 7.5 04/19/2021   ALBUMIN 3.7 04/19/2021   AST 26 04/19/2021   ALT 31 04/19/2021   ALKPHOS 62 04/19/2021   BILITOT 0.5 04/19/2021   GFRNONAA >60 08/29/2021    Lab Results  Component Value Date   CEA <1.00 04/27/2022    Medications: I have reviewed the patient's current medications.   Assessment/Plan: Rectal cancer  CT abdomen/pelvis 08/21/2020-large rectal mass measuring 4.4 x 2.6 x 5.5 cm, haziness in the mesorectal fat, obscuration of the fat plane between the mass and the adjacent posterior aspect of the uterus, numerous prominent mesorectal lymph nodes measuring up to 5 mm Colonoscopy  08/23/2020-fungating infiltrative nonobstructing large mass found in the rectum.  Mass was noncircumferential, measured 5 cm in length, 3 cm in diameter and was 7 cm from the anal verge.  No bleeding was present.   Pathology showed invasive colonic adenocarcinoma, moderately differentiated.  No evidence of mismatch repair deficiency. Chest CT 09/01/2020-no evidence of metastatic disease MRI pelvis 09/02/2020-tumor at 10.4 cm from anal verge, 5.9 cm from the internal anal sphincter, T3, approximately 15 greater than 5 mm lymph nodes, N2b, multiple uterine fibroids Cycle 1 FOLFOX 09/15/2020 Cycle 2 FOLFOX 09/29/2020 Cycle 3 FOLFOX 10/13/2020 Cycle 4 FOLFOX 10/27/2020 Cycle 5 FOLFOX 11/10/2020 Cycle 6 FOLFOX 11/24/2020 Cycle 7 FOLFOX 12/08/2020 Cycle 8 FOLFOX 01/05/2021, [facility] Radiation/Xeloda 01/23/2021-03/03/2021 Robotic assisted low anterior resection/loop ileostomy 05/10/2021-invasive adenocarcinoma, tumor extends into muscularis, negative resection margins, 0/19 nodes, grade 2, no lymphovascular perineural invasion, normal mismatch repair protein expression,pT2pN0 Ileostomy takedown 07/31/2021 CTs 08/25/2021-no evidence of metastatic disease 08/29/2021 guardant reveal-negative 11/29/2021 guardant reveal negative CTs 01/17/2022-no findings of recurrent or metastatic disease, 7 mm right renal lesion  2.   rectal bleeding, change in bowel habits secondary to #1 3.   Uterine fibroids noted on MRI pelvis 09/02/2020 4.   Port-A-Cath placement 09/09/2020, Interventional Radiology, Port-A-Cath removed 04/04/2021 5.  Admission 12/14/2020 with febrile neutropenia, E. coli bacteremia-completed an outpatient course of Augmentin 6.  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  Right upper extremity pain-03/31/2021 MRI cervical spine-no impingement, inflammation or mass to explain symptoms; disc bulging and ridging at C4-5 to C6-7.  C6-7 posterior  cervical laminotomy and foraminotomy and microdiscectomy 04/17/2021 8.  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 Bering is in clinical remission from rectal cancer.  She would like to continue Guardant reveal testing and every 60-month surveillance imaging.  She will return for a guardant reveal study next month.  She will be scheduled for an office visit and surveillance CTs in 3 months. The "shotty "axillary nodes are likely a benign finding. [name], MD  04/27/2022  1:19 PM

## 2022-05-09 ENCOUNTER — Other Ambulatory Visit: Payer: Self-pay | Admitting: Oncology

## 2022-05-09 ENCOUNTER — Inpatient Hospital Stay: Payer: Commercial Managed Care - PPO | Attending: Oncology | Admitting: Nutrition

## 2022-05-09 DIAGNOSIS — C20 Malignant neoplasm of rectum: Secondary | ICD-10-CM

## 2022-05-09 NOTE — Progress Notes (Signed)
42 yo female in remission from Rectal Cancer and followed by [name].  PMH includes Anxiety, Depression and GERD.  Medications include Imodium and Zofran.  Labs reviewed.  Height: 5'6". Weight:138.6 pounds UBW: 170 pounds BMI: 22.32.  Patient noted to have Ileostomy reversal in June 2023.  She reports persistent wt loss, bowel irregularly, and lactose intolerance. She is afraid to try new foods because she does not want to be in the bathroom multiple times at work. She has multiple food preferences which limit options for increasing calories. Reports eating mostly chicken and seafood, wraps, potatoes and rice, some fruit and veg. She loves sweets like pound cake. States she does not tolerate dairy in any amount.  Nutrition Diagnosis: Unintended wt loss related to cancer and associated treatments as evidenced by 18% wt loss from usual body weight.  Intervention: Educated on the need to increase calories and protein throughout the day. Reviewed ways to add calories. Try non-dairy milks and yogurt. Encourage meal prep so she has snacks available at work. (Works 12 hour shifts). Reviewed snacks to try which meet food preferences and eliminate intolerances. Provided nutrition fact sheet. Encouraged her to investigate different nutrition drinks to add calories. Recommended she keep a food journal and document new foods and tolerance. Keep new foods to a small amount and only try one new food every 2-3 days.  Monitoring, Evaluation, Goals: Increase oral intake to promote wt gain.  No follow up scheduled.

## 2022-05-10 ENCOUNTER — Other Ambulatory Visit (HOSPITAL_COMMUNITY): Payer: Self-pay

## 2022-05-10 ENCOUNTER — Other Ambulatory Visit: Payer: Self-pay

## 2022-05-10 MED ORDER — CYCLOBENZAPRINE HCL 10 MG PO TABS
10.0000 mg | ORAL_TABLET | Freq: Three times a day (TID) | ORAL | 0 refills | Status: DC | PRN
  Filled 2022-05-10 – 2022-05-14 (×2): qty 30, 10d supply, fill #0

## 2022-05-14 ENCOUNTER — Other Ambulatory Visit (HOSPITAL_COMMUNITY): Payer: Self-pay

## 2022-05-15 ENCOUNTER — Inpatient Hospital Stay: Payer: Commercial Managed Care - PPO

## 2022-05-15 DIAGNOSIS — C20 Malignant neoplasm of rectum: Secondary | ICD-10-CM

## 2022-07-02 ENCOUNTER — Emergency Department (HOSPITAL_BASED_OUTPATIENT_CLINIC_OR_DEPARTMENT_OTHER): Payer: Commercial Managed Care - PPO

## 2022-07-02 ENCOUNTER — Emergency Department (HOSPITAL_BASED_OUTPATIENT_CLINIC_OR_DEPARTMENT_OTHER)
Admission: EM | Admit: 2022-07-02 | Discharge: 2022-07-02 | Disposition: A | Discharge status: Home / Self Care | Payer: Commercial Managed Care - PPO | Attending: Emergency Medicine | Admitting: Emergency Medicine

## 2022-07-02 ENCOUNTER — Other Ambulatory Visit: Payer: Self-pay

## 2022-07-02 DIAGNOSIS — K921 Melena: Secondary | ICD-10-CM | POA: Diagnosis not present

## 2022-07-02 DIAGNOSIS — K644 Residual hemorrhoidal skin tags: Secondary | ICD-10-CM

## 2022-07-02 DIAGNOSIS — R109 Unspecified abdominal pain: Secondary | ICD-10-CM | POA: Diagnosis not present

## 2022-07-02 LAB — LIPASE, BLOOD: Lipase: 15 U/L (ref 11–51)

## 2022-07-02 LAB — COMPREHENSIVE METABOLIC PANEL
ALT: 10 U/L (ref 0–44)
AST: 15 U/L (ref 15–41)
Albumin: 4.6 g/dL (ref 3.5–5.0)
Alkaline Phosphatase: 76 U/L (ref 38–126)
Anion gap: 9 (ref 5–15)
BUN: 10 mg/dL (ref 6–20)
CO2: 28 mmol/L (ref 22–32)
Calcium: 9.9 mg/dL (ref 8.9–10.3)
Chloride: 103 mmol/L (ref 98–111)
Creatinine, Ser: 0.76 mg/dL (ref 0.44–1.00)
GFR, Estimated: >60 mL/min (ref >60)
Glucose, Bld: 83 mg/dL (ref 70–99)
Potassium: 3.9 mmol/L (ref 3.5–5.1)
Sodium: 140 mmol/L (ref 135–145)
Total Bilirubin: 0.5 mg/dL (ref 0.3–1.2)
Total Protein: 7.9 g/dL (ref 6.5–8.1)

## 2022-07-02 LAB — CBC
HCT: 40.8 % (ref 36.0–46.0)
Hemoglobin: 12.8 g/dL (ref 12.0–15.0)
MCH: 26.3 pg (ref 26.0–34.0)
MCHC: 31.4 g/dL (ref 30.0–36.0)
MCV: 83.8 fL (ref 80.0–100.0)
Platelets: 237 10*3/uL (ref 150–400)
RBC: 4.87 MIL/uL (ref 3.87–5.11)
RDW: 14.6 % (ref 11.5–15.5)
WBC: 4.5 10*3/uL (ref 4.0–10.5)
nRBC: 0 % (ref 0.0–0.2)

## 2022-07-02 LAB — PROTIME-INR
INR: 0.9 (ref 0.8–1.2)
Prothrombin Time: 12.6 seconds (ref 11.4–15.2)

## 2022-07-02 LAB — PREGNANCY, URINE: Preg Test, Ur: NEGATIVE

## 2022-07-02 MED ORDER — IOHEXOL 300 MG/ML  SOLN
100.0000 mL | Freq: Once | INTRAMUSCULAR | Status: AC | PRN
  Administered 2022-07-02: 80 mL via INTRAVENOUS

## 2022-07-02 MED ORDER — SODIUM CHLORIDE 0.9 % IV BOLUS
1000.0000 mL | Freq: Once | INTRAVENOUS | Status: AC
  Administered 2022-07-02: 1000 mL via INTRAVENOUS

## 2022-07-02 MED ORDER — NIFEDIPINE 0.3 % OINTMENT: 1.0000 | TOPICAL_OINTMENT | Freq: Four times a day (QID) | CUTANEOUS | 0 refills | Status: AC

## 2022-07-02 NOTE — ED Triage Notes (Signed)
A+Ox4. Ambulatory to triage.  Reports bright red blood in stool last night. HX colon resection last year due to CA. HX hemorrhoids as well. Denies light-headedness or near syncope.

## 2022-07-02 NOTE — ED Provider Notes (Signed)
[place] EMERGENCY DEPARTMENT AT [facility] Provider Note   CSN: [id number] Arrival date & time: 07/02/22  1412     History {Add pertinent medical, surgical, social history, OB history to HPI:1} Chief Complaint  Patient presents with   Blood In Stools    Sharon Bennett is a 42 y.o. female with PMH colorectal cancer currently in remission since summer 2023 presents to the ED complaining of rectal bleeding last night.  She states that at baseline she suffers from significant constipation and frequently strains to have a bowel movement and has also had hemorrhoids in the past.  And that she frequently has small amounts of bleeding with her bowel movements last night had a larger volume of blood with her stool.  States that she has associated pain when trying to have a bowel movement to the diffuse abdomen but no other pain, fever, nausea, vomiting, or other complaints.  She is followed by her oncologist who she last saw about 2 months ago.  She is scheduled to see her oncologist again next month for a follow-up CT scan.  No history of significant anemia requiring blood transfusion.    Home Medications Prior to Admission medications   Medication Sig Start Date End Date Taking? Authorizing Provider  amitriptyline (ELAVIL) 25 MG tablet Take 1 tablet (25 mg total) by mouth at bedtime. 04/02/22   Rana Snare, NP  cyclobenzaprine (FLEXERIL) 10 MG tablet Take 1 tablet (10 mg total) by mouth 3 (three) times daily as needed for muscle spasms. 05/10/22   Ladene Artist, MD  diclofenac (VOLTAREN) 75 MG EC tablet TAKE 1 TABLET BY MOUTH 2 TIMES DAILY AS NEEDED. 11/21/21   Kathryne Hitch, MD  fexofenadine (ALLEGRA) 180 MG tablet Take 180 mg by mouth daily as needed for allergies or rhinitis.    [provider]  HYDROcodone-acetaminophen (NORCO/VICODIN) 5-325 MG tablet Take 1 tablet by mouth every 6 (six) hours as needed for severe pain (not controlled with tylenol and  ibuprofen first). Patient not taking: Reported on 09/01/2021 08/03/21   Andria Meuse, MD  loperamide (IMODIUM) 2 MG capsule Take 1 capsule (2 mg total) by mouth 2 (two) times daily as needed for diarrhea or loose stools. 05/14/21   Gaynelle Adu, MD  melatonin 5 MG TABS Take 5 mg by mouth at bedtime as needed (sleep). Patient not taking: Reported on 04/27/2022    [provider]  ondansetron (ZOFRAN) 8 MG tablet Take 1 tablet (8 mg total) by mouth every 8 (eight) hours as needed for nausea or vomiting. Patient not taking: Reported on 04/27/2022 09/12/20   Ladene Artist, MD  pantoprazole (PROTONIX) 40 MG tablet Take 1 tablet (40 mg total) by mouth daily. Patient not taking: Reported on 04/27/2022 07/16/21   de [place], Buren Kos, MD  propranolol (INDERAL) 20 MG tablet Take 1 tablet (20 mg total) by mouth 2 (two) times daily as needed (heart rate at rest >120bpm, palpitations). Patient not taking: Reported on 04/27/2022 07/14/21   Alver Sorrow, NP      Allergies    Codeine, Dilaudid [hydromorphone], Mushroom extract complex, Oxycodone, and Tegaderm ag mesh [silver]    Review of Systems   Review of Systems  All other systems reviewed and are negative.   Physical Exam Updated Vital Signs BP (!) 143/100   Pulse (!) 105   Temp 98.2 F (36.8 C) (Oral)   Resp 16   Ht 5\' 6"  (1.676 m)   Wt 65.8  kg   SpO2 99%   BMI 23.40 kg/m  Physical Exam Vitals and nursing note reviewed.  Constitutional:      General: She is not in acute distress.    Appearance: Normal appearance. She is not ill-appearing, toxic-appearing or diaphoretic.  HENT:     Head: Normocephalic and atraumatic.     Mouth/Throat:     Mouth: Mucous membranes are moist.  Eyes:     General: No scleral icterus.    Conjunctiva/sclera: Conjunctivae normal.  Cardiovascular:     Rate and Rhythm: Regular rhythm. Tachycardia present.     Heart sounds: No murmur heard. Pulmonary:     Effort: Pulmonary effort is normal. No  respiratory distress.     Breath sounds: Normal breath sounds. No stridor. No wheezing, rhonchi or rales.  Chest:     Chest wall: No tenderness.  Abdominal:     General: Abdomen is flat. There is no distension.     Palpations: Abdomen is soft.     Tenderness: There is no abdominal tenderness. There is no right CVA tenderness, left CVA tenderness, guarding or rebound.  Genitourinary:    Comments: Performed with NT as chaperone Musculoskeletal:        General: Normal range of motion.     Cervical back: Normal range of motion and neck supple.     Right lower leg: No edema.     Left lower leg: No edema.  Skin:    General: Skin is warm and dry.     Capillary Refill: Capillary refill takes less than 2 seconds.     Coloration: Skin is not jaundiced or pale.     Findings: No rash.  Neurological:     Mental Status: She is alert. Mental status is at baseline.  Psychiatric:        Behavior: Behavior normal.     ED Results / Procedures / Treatments   Labs (all labs ordered are listed, but only abnormal results are displayed) Labs Reviewed  PREGNANCY, URINE  COMPREHENSIVE METABOLIC PANEL  CBC  PROTIME-INR  LIPASE, BLOOD  POC OCCULT BLOOD, ED    EKG None  Radiology CT ABDOMEN PELVIS W CONTRAST  Result Date: 07/02/2022 CLINICAL DATA:  Abdominal pain, acute, nonlocalized constipation, history of rectal cancer in remission EXAM: CT ABDOMEN AND PELVIS WITH CONTRAST TECHNIQUE: Multidetector CT imaging of the abdomen and pelvis was performed using the standard protocol following bolus administration of intravenous contrast. RADIATION DOSE REDUCTION: This exam was performed according to the departmental dose-optimization program which includes automated exposure control, adjustment of the mA and/or kV according to patient size and/or use of iterative reconstruction technique. CONTRAST:  80mL OMNIPAQUE IOHEXOL 300 MG/ML  SOLN COMPARISON:  CT abdomen pelvis 01/17/2022 FINDINGS: Lower chest: No  acute abnormality. Hepatobiliary: No focal liver abnormality. No gallstones, gallbladder wall thickening, or pericholecystic fluid. No biliary dilatation. Pancreas: No focal lesion. Normal pancreatic contour. No surrounding inflammatory changes. No main pancreatic ductal dilatation. Spleen: Normal in size without focal abnormality. Adrenals/Urinary Tract: No adrenal nodule bilaterally. Bilateral kidneys enhance symmetrically. Subcentimeter hypodensity too small to characterize - no further follow-up indicated. No hydronephrosis. No hydroureter. The urinary bladder is unremarkable. Stomach/Bowel: Small to large bowel resection. Stomach is within normal limits. No evidence of bowel wall thickening or dilatation. Appendix appears normal. Vascular/Lymphatic: No abdominal aorta or iliac aneurysm. No abdominal, pelvic, or inguinal lymphadenopathy. Reproductive: 2.4 cm enhancing mass along the uterus consistent with a fibroid. Uterus and bilateral adnexa are unremarkable. Other: No intraperitoneal  free fluid. No intraperitoneal free gas. No organized fluid collection. Musculoskeletal: No abdominal wall hernia or abnormality. No suspicious lytic or blastic osseous lesions. No acute displaced fracture. L5-S1 intervertebral disc space narrowing. IMPRESSION: 1. No acute intra-abdominal or intrapelvic abnormality. 2. Uterine fibroid. Electronically Signed   By: Tish Frederickson M.D.   On: 07/02/2022 19:15    Procedures Procedures  Orthostatic Lying BP- Lying: 133/91 Abnormal  Pulse- Lying: 89 Orthostatic Sitting BP- Sitting: 143/100 Abnormal  Pulse- Sitting: 94 Orthostatic Standing at 0 minutes BP- Standing at 0 minutes: 121/105 Abnormal  Pulse- Standing at 0 minutes: 109 Orthostatic Standing at 3 minutes BP- Standing at 3 minutes: 143/100 Abnormal  Pulse- Standing at 3 minutes: 109  Medications Ordered in ED Medications  sodium chloride 0.9 % bolus 1,000 mL (1,000 mLs Intravenous New Bag/Given 07/02/22 1834)   iohexol (OMNIPAQUE) 300 MG/ML solution 100 mL (80 mLs Intravenous Contrast Given 07/02/22 1844)    ED Course/ Medical Decision Making/ A&P   {   Click here for ABCD2, HEART and other calculatorsREFRESH Note before signing :1}                          Medical Decision Making Amount and/or Complexity of Data Reviewed Labs: ordered. Decision-making details documented in ED Course. Radiology: ordered. Decision-making details documented in ED Course. ECG/medicine tests: ordered. Decision-making details documented in ED Course.  Risk Prescription drug management.   Medical Decision Making:   TALOR WANDLING is a 42 y.o. female who presented to the ED today with abdominal pain detailed above.    Patient's presentation is complicated by their history of colorectal cancer.  Complete initial physical exam performed, notably the patient  was in no acute distress.  She was tachycardic with regular rhythm.  Abdomen soft, nondistended, nontender and without rebound, guarding, or peritoneal signs.  No CVA tenderness.  Patient nontoxic-appearing.    Reviewed and confirmed nursing documentation for past medical history, family history, social history.    Initial Assessment:   With the patient's presentation of abdominal pain, differential diagnosis includes but is not limited to AAA, mesenteric ischemia, appendicitis, diverticulitis, DKA, gastritis, gastroenteritis, AMI, nephrolithiasis, pancreatitis, peritonitis, adrenal insufficiency, intestinal ischemia, constipation, UTI, SBO/LBO, splenic rupture, biliary disease, IBD, IBS, PUD, hepatitis, STD, ovarian/testicular torsion, electrolyte disturbance, DKA, dehydration, acute kidney injury, renal failure, cholecystitis, cholelithiasis, choledocholithiasis, abdominal pain of  unknown etiology, GI bleed, pregnancy, incomplete abortion, septic abortion, threatened abortion, ectopic pregnancy, PID.   Initial Plan:  Screening labs including CBC and Metabolic  panel to evaluate for infectious or metabolic etiology of disease.  Lipase to evaluate for pancreatitis Urinalysis with reflex culture ordered to evaluate for UTI or relevant urologic/nephrologic pathology.  CT abd/pelvis to evaluate for intra-abdominal pathology EKG to evaluate for cardiac pathology PT-INR to assess for bleeding risk Symptomatic management Objective evaluation as reviewed   Initial Study Results:   Laboratory  All laboratory results reviewed without evidence of clinically relevant pathology.   Exceptions include: ***  \ Radiology:  All images reviewed independently. Agree with radiology report at this time.   CT ABDOMEN PELVIS W CONTRAST  Result Date: 07/02/2022 CLINICAL DATA:  Abdominal pain, acute, nonlocalized constipation, history of rectal cancer in remission EXAM: CT ABDOMEN AND PELVIS WITH CONTRAST TECHNIQUE: Multidetector CT imaging of the abdomen and pelvis was performed using the standard protocol following bolus administration of intravenous contrast. RADIATION DOSE REDUCTION: This exam was performed according to the departmental dose-optimization program which  includes automated exposure control, adjustment of the mA and/or kV according to patient size and/or use of iterative reconstruction technique. CONTRAST:  80mL OMNIPAQUE IOHEXOL 300 MG/ML  SOLN COMPARISON:  CT abdomen pelvis 01/17/2022 FINDINGS: Lower chest: No acute abnormality. Hepatobiliary: No focal liver abnormality. No gallstones, gallbladder wall thickening, or pericholecystic fluid. No biliary dilatation. Pancreas: No focal lesion. Normal pancreatic contour. No surrounding inflammatory changes. No main pancreatic ductal dilatation. Spleen: Normal in size without focal abnormality. Adrenals/Urinary Tract: No adrenal nodule bilaterally. Bilateral kidneys enhance symmetrically. Subcentimeter hypodensity too small to characterize - no further follow-up indicated. No hydronephrosis. No hydroureter. The urinary  bladder is unremarkable. Stomach/Bowel: Small to large bowel resection. Stomach is within normal limits. No evidence of bowel wall thickening or dilatation. Appendix appears normal. Vascular/Lymphatic: No abdominal aorta or iliac aneurysm. No abdominal, pelvic, or inguinal lymphadenopathy. Reproductive: 2.4 cm enhancing mass along the uterus consistent with a fibroid. Uterus and bilateral adnexa are unremarkable. Other: No intraperitoneal free fluid. No intraperitoneal free gas. No organized fluid collection. Musculoskeletal: No abdominal wall hernia or abnormality. No suspicious lytic or blastic osseous lesions. No acute displaced fracture. L5-S1 intervertebral disc space narrowing. IMPRESSION: 1. No acute intra-abdominal or intrapelvic abnormality. 2. Uterine fibroid. Electronically Signed   By: Tish Frederickson M.D.   On: 07/02/2022 19:15      Final Assessment and Plan:   ***    Clinical Impression: No diagnosis found.   Data Unavailable    {Document critical care time when appropriate:1} {Document review of labs and clinical decision tools ie heart score, Chads2Vasc2 etc:1}  {Document your independent review of radiology images, and any outside records:1} {Document your discussion with family members, caretakers, and with consultants:1} {Document social determinants of health affecting pt's care:1} {Document your decision making why or why not admission, treatments were needed:1} Final Clinical Impression(s) / ED Diagnoses Final diagnoses:  None    Rx / DC Orders ED Discharge Orders     None

## 2022-07-02 NOTE — ED Notes (Signed)
Pt discharged to home, NAD noted 

## 2022-07-02 NOTE — Discharge Instructions (Addendum)
Thank you for letting us take care of you today.  Overall, your workup including your blood work and CT scan were reassuring.  On exam, you do have multiple external hemorrhoids.  I am prescribing a cream to help with this.  Please follow-up with your oncologist next month as scheduled.  Discussed with him the CT scan that you had today and any further recommendations for imaging or testing.  I recommend following up with your PCP this week to discuss any continued symptoms and your ED visit today.  For any new or worsening symptoms, please return to the nearest ED for reevaluation.

## 2022-07-03 ENCOUNTER — Other Ambulatory Visit (HOSPITAL_COMMUNITY): Payer: Self-pay

## 2022-07-12 ENCOUNTER — Other Ambulatory Visit: Payer: Self-pay | Admitting: Nurse Practitioner

## 2022-07-12 ENCOUNTER — Other Ambulatory Visit (HOSPITAL_COMMUNITY): Payer: Self-pay

## 2022-07-12 DIAGNOSIS — C20 Malignant neoplasm of rectum: Secondary | ICD-10-CM

## 2022-07-12 MED ORDER — AMITRIPTYLINE HCL 25 MG PO TABS
25.0000 mg | ORAL_TABLET | Freq: Every day | ORAL | 2 refills | Status: DC
Start: 2022-07-12 — End: 2022-10-16
  Filled 2022-07-12 – 2022-07-14 (×2): qty 30, 30d supply, fill #0
  Filled 2022-08-13 – 2022-08-16 (×2): qty 30, 30d supply, fill #1
  Filled 2022-09-16: qty 30, 30d supply, fill #2

## 2022-07-14 ENCOUNTER — Other Ambulatory Visit (HOSPITAL_COMMUNITY): Payer: Self-pay

## 2022-07-16 ENCOUNTER — Other Ambulatory Visit (HOSPITAL_COMMUNITY): Payer: Self-pay

## 2022-07-20 ENCOUNTER — Inpatient Hospital Stay: Payer: Commercial Managed Care - PPO | Attending: Oncology

## 2022-07-20 ENCOUNTER — Ambulatory Visit (HOSPITAL_BASED_OUTPATIENT_CLINIC_OR_DEPARTMENT_OTHER)
Admission: RE | Admit: 2022-07-20 | Discharge: 2022-07-20 | Disposition: A | Discharge status: Home / Self Care | Payer: Commercial Managed Care - PPO | Source: Ambulatory Visit | Attending: Oncology | Admitting: Oncology

## 2022-07-20 DIAGNOSIS — K5730 Diverticulosis of large intestine without perforation or abscess without bleeding: Secondary | ICD-10-CM | POA: Diagnosis not present

## 2022-07-20 DIAGNOSIS — R194 Change in bowel habit: Secondary | ICD-10-CM | POA: Insufficient documentation

## 2022-07-20 DIAGNOSIS — C20 Malignant neoplasm of rectum: Secondary | ICD-10-CM

## 2022-07-20 DIAGNOSIS — Z79899 Other long term (current) drug therapy: Secondary | ICD-10-CM | POA: Insufficient documentation

## 2022-07-20 DIAGNOSIS — Z9221 Personal history of antineoplastic chemotherapy: Secondary | ICD-10-CM | POA: Insufficient documentation

## 2022-07-20 DIAGNOSIS — K644 Residual hemorrhoidal skin tags: Secondary | ICD-10-CM | POA: Insufficient documentation

## 2022-07-20 DIAGNOSIS — G629 Polyneuropathy, unspecified: Secondary | ICD-10-CM | POA: Insufficient documentation

## 2022-07-20 LAB — BASIC METABOLIC PANEL - CANCER CENTER ONLY
Anion gap: 7 (ref 5–15)
BUN: 7 mg/dL (ref 6–20)
CO2: 32 mmol/L (ref 22–32)
Calcium: 9.6 mg/dL (ref 8.9–10.3)
Chloride: 100 mmol/L (ref 98–111)
Creatinine: 0.72 mg/dL (ref 0.44–1.00)
GFR, Estimated: >60 mL/min (ref >60)
Glucose, Bld: 88 mg/dL (ref 70–99)
Potassium: 3.9 mmol/L (ref 3.5–5.1)
Sodium: 139 mmol/L (ref 135–145)

## 2022-07-20 LAB — CEA (ACCESS): CEA (CHCC): <1 ng/mL (ref 0.00–5.00)

## 2022-07-20 MED ORDER — IOHEXOL 300 MG/ML  SOLN
100.0000 mL | Freq: Once | INTRAMUSCULAR | Status: AC | PRN
  Administered 2022-07-20: 80 mL via INTRAVENOUS

## 2022-07-23 ENCOUNTER — Inpatient Hospital Stay (HOSPITAL_BASED_OUTPATIENT_CLINIC_OR_DEPARTMENT_OTHER): Payer: Commercial Managed Care - PPO | Admitting: Oncology

## 2022-07-23 VITALS — BP 122/87 | HR 100 | Temp 98.1°F | Resp 18 | Ht 66.0 in | Wt 145.8 lb

## 2022-07-23 DIAGNOSIS — R194 Change in bowel habit: Secondary | ICD-10-CM | POA: Diagnosis not present

## 2022-07-23 DIAGNOSIS — Z79899 Other long term (current) drug therapy: Secondary | ICD-10-CM | POA: Diagnosis not present

## 2022-07-23 DIAGNOSIS — G629 Polyneuropathy, unspecified: Secondary | ICD-10-CM | POA: Diagnosis not present

## 2022-07-23 DIAGNOSIS — C20 Malignant neoplasm of rectum: Secondary | ICD-10-CM

## 2022-07-23 DIAGNOSIS — Z9221 Personal history of antineoplastic chemotherapy: Secondary | ICD-10-CM | POA: Diagnosis not present

## 2022-07-23 DIAGNOSIS — K644 Residual hemorrhoidal skin tags: Secondary | ICD-10-CM | POA: Diagnosis not present

## 2022-07-23 NOTE — Progress Notes (Signed)
[facility] OFFICE PROGRESS NOTE   Diagnosis: Rectal cancer  INTERVAL HISTORY:   Sharon Bennett returns as scheduled.  She reports improvement in constipation.  She is no longer using enemas.  She continues to have irregular bowel habits.  She reports a few episodes of rectal bleeding.  She was seen in the emergency room for rectal bleeding on 07/02/2022.  External hemorrhoids were noted.  She was prescribed nifedipine ointment. She continues to have numbness and tingling in the feet.  Her fingers feel weak at times.  Objective:  Vital signs in last 24 hours:  Blood pressure 122/87, pulse 100, temperature 98.1 F (36.7 C), temperature source Oral, resp. rate 18, height 5\' 6"  (1.676 m), weight 145 lb 12.8 oz (66.1 kg), SpO2 100 %.    Lymphatics: Shotty "right greater than left axillary nodes Breast: Prominent 2 cm oval density at the upper right breast 11 o'clock position a few centimeters outside of the areola.  No discrete mass. Resp: Lungs clear bilaterally Cardio: Regular rate and rhythm GI: No hepatosplenomegaly, nontender, no mass Vascular: No leg edema  Skin: 3 mm nodule at the low transverse scar.  The nodule is to the right of midline and inferior to the scar   Lab Results:  Lab Results  Component Value Date   WBC 4.5 07/02/2022   HGB 12.8 07/02/2022   HCT 40.8 07/02/2022   MCV 83.8 07/02/2022   PLT 237 07/02/2022   NEUTROABS 1.8 01/26/2022    CMP  Lab Results  Component Value Date   NA 139 07/20/2022   K 3.9 07/20/2022   CL 100 07/20/2022   CO2 32 07/20/2022   GLUCOSE 88 07/20/2022   BUN 7 07/20/2022   CREATININE 0.72 07/20/2022   CALCIUM 9.6 07/20/2022   PROT 7.9 07/02/2022   ALBUMIN 4.6 07/02/2022   AST 15 07/02/2022   ALT 10 07/02/2022   ALKPHOS 76 07/02/2022   BILITOT 0.5 07/02/2022   GFRNONAA >60 07/20/2022    Lab Results  Component Value Date   CEA <1.00 07/20/2022      Imaging:  CT CHEST ABDOMEN PELVIS W CONTRAST  Result  Date: 07/20/2022 CLINICAL DATA:  Rectal cancer restaging, status post resection, chemotherapy and radiation * Tracking Code: BO * EXAM: CT CHEST, ABDOMEN, AND PELVIS WITH CONTRAST TECHNIQUE: Multidetector CT imaging of the chest, abdomen and pelvis was performed following the standard protocol during bolus administration of intravenous contrast. RADIATION DOSE REDUCTION: This exam was performed according to the departmental dose-optimization program which includes automated exposure control, adjustment of the mA and/or kV according to patient size and/or use of iterative reconstruction technique. CONTRAST:  80mL OMNIPAQUE IOHEXOL 300 MG/ML SOLN additional oral enteric contrast COMPARISON:  CT abdomen pelvis, 07/02/2022, CT chest abdomen pelvis, 01/17/2022 FINDINGS: CT CHEST FINDINGS Cardiovascular: No significant vascular findings. Normal heart size. No pericardial effusion. Mediastinum/Nodes: No enlarged mediastinal, hilar, or axillary lymph nodes. Thymic remnant in the anterior mediastinum. Thyroid gland, trachea, and esophagus demonstrate no significant findings. Lungs/Pleura: Lungs are clear. No pleural effusion or pneumothorax. Musculoskeletal: No chest wall abnormality. No acute osseous findings. CT ABDOMEN PELVIS FINDINGS Hepatobiliary: No solid liver abnormality is seen. No gallstones, gallbladder wall thickening, or biliary dilatation. Pancreas: Unremarkable. No pancreatic ductal dilatation or surrounding inflammatory changes. Spleen: Normal in size without significant abnormality. Adrenals/Urinary Tract: Adrenal glands are unremarkable. Kidneys are normal, without renal calculi, solid lesion, or hydronephrosis. Bladder is unremarkable. Stomach/Bowel: Stomach is within normal limits. Appendix appears normal. No evidence of  bowel wall thickening, distention, or inflammatory changes. Sigmoid diverticulosis. Status post low anterior resection and reanastomosis. Additional ileal resection secondary to ileostomy  and reversal. Unchanged small volume of loculated fluid in the low pelvis adjacent to the anastomosis (series 3, image 98). Vascular/Lymphatic: No significant vascular findings are present. No enlarged abdominal or pelvic lymph nodes. Reproductive: Unchanged small fibroid of the right uterine fundus. Other: No abdominal wall hernia or abnormality. No ascites. Musculoskeletal: No acute osseous findings. IMPRESSION: 1. Status post low anterior resection and reanastomosis. Additional ileal resection secondary to ileostomy and reversal. Unchanged small volume of loculated fluid in the low pelvis adjacent to the anastomosis. 2. No evidence of recurrent or metastatic disease in the chest, abdomen, or pelvis. Electronically Signed   By: Jearld Lesch M.D.   On: 07/20/2022 15:03    Medications: I have reviewed the patient's current medications.   Assessment/Plan:  Rectal cancer  CT abdomen/pelvis 08/21/2020-large rectal mass measuring 4.4 x 2.6 x 5.5 cm, haziness in the mesorectal fat, obscuration of the fat plane between the mass and the adjacent posterior aspect of the uterus, numerous prominent mesorectal lymph nodes measuring up to 5 mm Colonoscopy 08/23/2020-fungating infiltrative nonobstructing large mass found in the rectum.  Mass was noncircumferential, measured 5 cm in length, 3 cm in diameter and was 7 cm from the anal verge.  No bleeding was present.   Pathology showed invasive colonic adenocarcinoma, moderately differentiated.  No evidence of mismatch repair deficiency. Chest CT 09/01/2020-no evidence of metastatic disease MRI pelvis 09/02/2020-tumor at 10.4 cm from anal verge, 5.9 cm from the internal anal sphincter, T3, approximately 15 greater than 5 mm lymph nodes, N2b, multiple uterine fibroids Cycle 1 FOLFOX 09/15/2020 Cycle 2 FOLFOX 09/29/2020 Cycle 3 FOLFOX 10/13/2020 Cycle 4 FOLFOX 10/27/2020 Cycle 5 FOLFOX 11/10/2020 Cycle 6 FOLFOX 11/24/2020 Cycle 7 FOLFOX 12/08/2020 Cycle 8 FOLFOX 01/05/2021,  Ziextenzo Radiation/Xeloda 01/23/2021-03/03/2021 Robotic assisted low anterior resection/loop ileostomy 05/10/2021-invasive adenocarcinoma, tumor extends into muscularis, negative resection margins, 0/19 nodes, grade 2, no lymphovascular perineural invasion, normal mismatch repair protein expression,[id number] Ileostomy takedown 07/31/2021 CTs 08/25/2021-no evidence of metastatic disease 08/29/2021 guardant reveal-negative 11/29/2021 guardant reveal negative CTs 01/17/2022-no findings of recurrent or metastatic disease, 7 mm right renal lesion  CT 07/20/2022-no evidence of recurrent disease in the chest, abdomen, or pelvis 2.   rectal bleeding, change in bowel habits secondary to #1 3.   Uterine fibroids noted on MRI pelvis 09/02/2020 4.   Port-A-Cath placement 09/09/2020, Interventional Radiology, Port-A-Cath removed 04/04/2021 5.  Admission 12/14/2020 with febrile neutropenia, E. coli bacteremia-completed an outpatient course of Augmentin 6.  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  Right upper extremity pain-03/31/2021 MRI cervical spine-no impingement, inflammation or mass to explain symptoms; disc bulging and ridging at C4-5 to C6-7.  C6-7 posterior cervical laminotomy and foraminotomy and microdiscectomy 04/17/2021 8.  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  She continues to have irregular bowel habits following the neoadjuvant therapy and low anterior resection.  She reports recent episodes of rectal bleeding.  We will refer her to Dr. Elnoria Howard to consider the indication for an endoscopic evaluation.  Ms. Durnan will continue amitriptyline.  She will begin a trial of nighttime  gabapentin for the neuropathy.  The firm fullness at the upper right  breast is likely benign finding secondary to prominent fibroglandular tissue.  She will contact us if this area changes.  She had a negative screening mammogram in December 2023.    Thornton Papas, MD  07/23/2022  12:12 PM

## 2022-07-24 ENCOUNTER — Telehealth: Payer: Self-pay | Admitting: Oncology

## 2022-07-24 NOTE — Telephone Encounter (Signed)
Contacted patient to scheduled appointments. Left message with appointment details and a call back number if patient had any questions or could not accommodate the time we provided.   

## 2022-07-27 ENCOUNTER — Telehealth: Payer: Self-pay | Admitting: *Deleted

## 2022-07-27 NOTE — Telephone Encounter (Signed)
Call to Dr. Haywood Pao office to f/u on referral. Was informed she is an established patient and does not require a referral. She only needs to call office for an appointment. Sharon Bennett made aware and she will call.

## 2022-08-16 ENCOUNTER — Other Ambulatory Visit (HOSPITAL_COMMUNITY): Payer: Self-pay

## 2022-09-19 ENCOUNTER — Other Ambulatory Visit (HOSPITAL_COMMUNITY): Payer: Self-pay

## 2022-10-16 ENCOUNTER — Other Ambulatory Visit: Payer: Self-pay | Admitting: Oncology

## 2022-10-16 DIAGNOSIS — C20 Malignant neoplasm of rectum: Secondary | ICD-10-CM

## 2022-10-22 ENCOUNTER — Inpatient Hospital Stay: Payer: No Typology Code available for payment source

## 2022-10-22 ENCOUNTER — Other Ambulatory Visit: Payer: Self-pay

## 2022-10-22 ENCOUNTER — Encounter: Payer: Self-pay | Admitting: Nurse Practitioner

## 2022-10-22 ENCOUNTER — Inpatient Hospital Stay: Payer: No Typology Code available for payment source | Attending: Oncology | Admitting: Nurse Practitioner

## 2022-10-22 VITALS — BP 124/88 | HR 93 | Temp 98.1°F | Resp 18 | Ht 66.0 in | Wt 148.8 lb

## 2022-10-22 DIAGNOSIS — C20 Malignant neoplasm of rectum: Secondary | ICD-10-CM

## 2022-10-22 DIAGNOSIS — G620 Drug-induced polyneuropathy: Secondary | ICD-10-CM | POA: Diagnosis not present

## 2022-10-22 DIAGNOSIS — R200 Anesthesia of skin: Secondary | ICD-10-CM | POA: Diagnosis not present

## 2022-10-22 DIAGNOSIS — T451X5A Adverse effect of antineoplastic and immunosuppressive drugs, initial encounter: Secondary | ICD-10-CM | POA: Diagnosis not present

## 2022-10-22 LAB — CBC WITH DIFFERENTIAL (CANCER CENTER ONLY)
Abs Immature Granulocytes: 0 10*3/uL (ref 0.00–0.07)
Basophils Absolute: 0 10*3/uL (ref 0.0–0.1)
Basophils Relative: 1 %
Eosinophils Absolute: 0.1 10*3/uL (ref 0.0–0.5)
Eosinophils Relative: 2 %
HCT: 39.8 % (ref 36.0–46.0)
Hemoglobin: 12.5 g/dL (ref 12.0–15.0)
Immature Granulocytes: 0 %
Lymphocytes Relative: 36 %
Lymphs Abs: 1.2 10*3/uL (ref 0.7–4.0)
MCH: 26.9 pg (ref 26.0–34.0)
MCHC: 31.4 g/dL (ref 30.0–36.0)
MCV: 85.8 fL (ref 80.0–100.0)
Monocytes Absolute: 0.3 10*3/uL (ref 0.1–1.0)
Monocytes Relative: 9 %
Neutro Abs: 1.8 10*3/uL (ref 1.7–7.7)
Neutrophils Relative %: 52 %
Platelet Count: 228 10*3/uL (ref 150–400)
RBC: 4.64 MIL/uL (ref 3.87–5.11)
RDW: 14.3 % (ref 11.5–15.5)
WBC Count: 3.4 10*3/uL — ABNORMAL LOW (ref 4.0–10.5)
nRBC: 0 % (ref 0.0–0.2)

## 2022-10-22 LAB — CMP (CANCER CENTER ONLY)
ALT: 13 U/L (ref 0–44)
AST: 15 U/L (ref 15–41)
Albumin: 4.4 g/dL (ref 3.5–5.0)
Alkaline Phosphatase: 74 U/L (ref 38–126)
Anion gap: 8 (ref 5–15)
BUN: 9 mg/dL (ref 6–20)
CO2: 29 mmol/L (ref 22–32)
Calcium: 9.3 mg/dL (ref 8.9–10.3)
Chloride: 104 mmol/L (ref 98–111)
Creatinine: 0.71 mg/dL (ref 0.44–1.00)
GFR, Estimated: >60 mL/min (ref >60)
Glucose, Bld: 90 mg/dL (ref 70–99)
Potassium: 4 mmol/L (ref 3.5–5.1)
Sodium: 141 mmol/L (ref 135–145)
Total Bilirubin: 0.5 mg/dL (ref 0.3–1.2)
Total Protein: 7.2 g/dL (ref 6.5–8.1)

## 2022-10-22 LAB — CEA (ACCESS): CEA (CHCC): <1 ng/mL (ref 0.00–5.00)

## 2022-10-22 MED ORDER — GABAPENTIN 300 MG PO CAPS
300.0000 mg | ORAL_CAPSULE | Freq: Every day | ORAL | 3 refills | Status: AC
Start: 2022-10-22 — End: ?

## 2022-10-22 NOTE — Progress Notes (Signed)
[facility] OFFICE PROGRESS NOTE   Diagnosis: Rectal cancer  INTERVAL HISTORY:   Sharon Bennett returns as scheduled.  She has changed jobs.  She notes bowels are doing "better".  She notes certain foods she cannot tolerate including dairy, pork and chocolate.  Those foods cause clustering of bowel movements.  No more episodes of rectal bleeding.  She has noted mild improvement in neuropathy symptoms involving the feet since resuming gabapentin 300 mg at bedtime.  She has an occasional abnormal sensation in the fingertips.  She has recently noticed periodic numbness involving the left arm, no neck pain.  Objective:  Vital signs in last 24 hours:  Blood pressure 124/88, pulse 93, temperature 98.1 F (36.7 C), temperature source Oral, resp. rate 18, height 5\' 6"  (1.676 m), weight 148 lb 12.8 oz (67.5 kg), SpO2 100%.    Lymphatics: No palpable cervical, supraclavicular, axillary or inguinal lymph nodes. Resp: Lungs clear bilaterally. Cardio: Regular rate and rhythm. GI: No hepatosplenomegaly. Vascular: No leg edema. Neuro: Upper extremity motor strength is intact.   Lab Results:  Lab Results  Component Value Date   WBC 4.5 07/02/2022   HGB 12.8 07/02/2022   HCT 40.8 07/02/2022   MCV 83.8 07/02/2022   PLT 237 07/02/2022   NEUTROABS 1.8 01/26/2022    Imaging:  No results found.  Medications: I have reviewed the patient's current medications.  Assessment/Plan: Rectal cancer  CT abdomen/pelvis 08/21/2020-large rectal mass measuring 4.4 x 2.6 x 5.5 cm, haziness in the mesorectal fat, obscuration of the fat plane between the mass and the adjacent posterior aspect of the uterus, numerous prominent mesorectal lymph nodes measuring up to 5 mm Colonoscopy 08/23/2020-fungating infiltrative nonobstructing large mass found in the rectum.  Mass was noncircumferential, measured 5 cm in length, 3 cm in diameter and was 7 cm from the anal verge.  No bleeding was present.    Pathology showed invasive colonic adenocarcinoma, moderately differentiated.  No evidence of mismatch repair deficiency. Chest CT 09/01/2020-no evidence of metastatic disease MRI pelvis 09/02/2020-tumor at 10.4 cm from anal verge, 5.9 cm from the internal anal sphincter, T3, approximately 15 greater than 5 mm lymph nodes, N2b, multiple uterine fibroids Cycle 1 FOLFOX 09/15/2020 Cycle 2 FOLFOX 09/29/2020 Cycle 3 FOLFOX 10/13/2020 Cycle 4 FOLFOX 10/27/2020 Cycle 5 FOLFOX 11/10/2020 Cycle 6 FOLFOX 11/24/2020 Cycle 7 FOLFOX 12/08/2020 Cycle 8 FOLFOX 01/05/2021, Ziextenzo Radiation/Xeloda 01/23/2021-03/03/2021 Robotic assisted low anterior resection/loop ileostomy 05/10/2021-invasive adenocarcinoma, tumor extends into muscularis, negative resection margins, 0/19 nodes, grade 2, no lymphovascular perineural invasion, normal mismatch repair protein expression,pT2pN0 Ileostomy takedown 07/31/2021 CTs 08/25/2021-no evidence of metastatic disease 08/29/2021 guardant reveal-negative 11/29/2021 guardant reveal negative CTs 01/17/2022-no findings of recurrent or metastatic disease, 7 mm right renal lesion  CT 07/20/2022-no evidence of recurrent disease in the chest, abdomen, or pelvis 2.   rectal bleeding, change in bowel habits secondary to #1 3.   Uterine fibroids noted on MRI pelvis 09/02/2020 4.   Port-A-Cath placement 09/09/2020, Interventional Radiology, Port-A-Cath removed 04/04/2021 5.  Admission 12/14/2020 with febrile neutropenia, E. coli bacteremia-completed an outpatient course of Augmentin 6.  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  Right upper extremity pain-03/31/2021 MRI cervical spine-no impingement, inflammation or mass to explain symptoms; disc bulging and ridging at C4-5 to C6-7.  C6-7 posterior cervical laminotomy and foraminotomy and microdiscectomy 04/17/2021 8.  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  We will follow-up on the CEA from today.  Plan for surveillance CT scans in approximately 3 months.  She continues amitriptyline for neuropathy.  She has noted some improvement since beginning a nighttime dose of gabapentin.  She will continue the same.  She notes intermittent numbness in the left arm.  She will follow-up with her primary or Dr. Dutch Quint.  She will return for an office visit in 3 months.    Sharon Bennett ANP/GNP-BC   10/22/2022  11:12 AM

## 2022-10-23 ENCOUNTER — Other Ambulatory Visit: Payer: Self-pay | Admitting: *Deleted

## 2022-10-23 DIAGNOSIS — C20 Malignant neoplasm of rectum: Secondary | ICD-10-CM

## 2022-10-23 NOTE — Progress Notes (Signed)
Due Guardant Reveal testing in October. Patient agrees. Scheduling message sent.

## 2022-10-31 ENCOUNTER — Encounter: Payer: Self-pay | Admitting: Nurse Practitioner

## 2022-11-02 ENCOUNTER — Encounter: Payer: Self-pay | Admitting: Nurse Practitioner

## 2022-11-22 ENCOUNTER — Inpatient Hospital Stay: Payer: No Typology Code available for payment source | Attending: Oncology

## 2022-11-22 DIAGNOSIS — C20 Malignant neoplasm of rectum: Secondary | ICD-10-CM

## 2022-12-26 NOTE — Telephone Encounter (Signed)
Telephone call  

## 2022-12-27 NOTE — Telephone Encounter (Signed)
Telephone call  

## 2022-12-31 ENCOUNTER — Other Ambulatory Visit: Payer: Self-pay

## 2022-12-31 ENCOUNTER — Encounter (HOSPITAL_BASED_OUTPATIENT_CLINIC_OR_DEPARTMENT_OTHER): Payer: Self-pay | Admitting: Emergency Medicine

## 2022-12-31 ENCOUNTER — Emergency Department (HOSPITAL_BASED_OUTPATIENT_CLINIC_OR_DEPARTMENT_OTHER): Payer: No Typology Code available for payment source | Admitting: Radiology

## 2022-12-31 ENCOUNTER — Emergency Department (HOSPITAL_BASED_OUTPATIENT_CLINIC_OR_DEPARTMENT_OTHER): Payer: No Typology Code available for payment source

## 2022-12-31 ENCOUNTER — Emergency Department (HOSPITAL_BASED_OUTPATIENT_CLINIC_OR_DEPARTMENT_OTHER)
Admission: EM | Admit: 2022-12-31 | Discharge: 2022-12-31 | Disposition: A | Discharge status: Home / Self Care | Payer: No Typology Code available for payment source | Attending: Emergency Medicine | Admitting: Emergency Medicine

## 2022-12-31 DIAGNOSIS — D72829 Elevated white blood cell count, unspecified: Secondary | ICD-10-CM | POA: Insufficient documentation

## 2022-12-31 DIAGNOSIS — R072 Precordial pain: Secondary | ICD-10-CM | POA: Insufficient documentation

## 2022-12-31 DIAGNOSIS — R000 Tachycardia, unspecified: Secondary | ICD-10-CM | POA: Diagnosis not present

## 2022-12-31 DIAGNOSIS — R202 Paresthesia of skin: Secondary | ICD-10-CM | POA: Diagnosis not present

## 2022-12-31 DIAGNOSIS — Z85048 Personal history of other malignant neoplasm of rectum, rectosigmoid junction, and anus: Secondary | ICD-10-CM | POA: Diagnosis not present

## 2022-12-31 DIAGNOSIS — I10 Essential (primary) hypertension: Secondary | ICD-10-CM | POA: Diagnosis not present

## 2022-12-31 LAB — CBC
HCT: 40.7 % (ref 36.0–46.0)
Hemoglobin: 13.1 g/dL (ref 12.0–15.0)
MCH: 27.6 pg (ref 26.0–34.0)
MCHC: 32.2 g/dL (ref 30.0–36.0)
MCV: 85.9 fL (ref 80.0–100.0)
Platelets: 237 10*3/uL (ref 150–400)
RBC: 4.74 MIL/uL (ref 3.87–5.11)
RDW: 14.6 % (ref 11.5–15.5)
WBC: 15 10*3/uL — ABNORMAL HIGH (ref 4.0–10.5)
nRBC: 0 % (ref 0.0–0.2)

## 2022-12-31 LAB — TROPONIN I (HIGH SENSITIVITY)
Troponin I (High Sensitivity): <2 ng/L (ref <18)
Troponin I (High Sensitivity): <2 ng/L (ref <18)

## 2022-12-31 LAB — PREGNANCY, URINE: Preg Test, Ur: NEGATIVE

## 2022-12-31 LAB — BASIC METABOLIC PANEL
Anion gap: 11 (ref 5–15)
BUN: 8 mg/dL (ref 6–20)
CO2: 24 mmol/L (ref 22–32)
Calcium: 9.6 mg/dL (ref 8.9–10.3)
Chloride: 102 mmol/L (ref 98–111)
Creatinine, Ser: 0.65 mg/dL (ref 0.44–1.00)
GFR, Estimated: >60 mL/min (ref >60)
Glucose, Bld: 82 mg/dL (ref 70–99)
Potassium: 3.8 mmol/L (ref 3.5–5.1)
Sodium: 137 mmol/L (ref 135–145)

## 2022-12-31 MED ORDER — KETOROLAC TROMETHAMINE 15 MG/ML IJ SOLN
15.0000 mg | Freq: Once | INTRAMUSCULAR | Status: AC
  Administered 2022-12-31: 15 mg via INTRAVENOUS
  Filled 2022-12-31: qty 1

## 2022-12-31 MED ORDER — IOHEXOL 350 MG/ML SOLN
100.0000 mL | Freq: Once | INTRAVENOUS | Status: AC | PRN
  Administered 2022-12-31: 75 mL via INTRAVENOUS

## 2022-12-31 MED ORDER — LACTATED RINGERS IV BOLUS
1000.0000 mL | Freq: Once | INTRAVENOUS | Status: AC
  Administered 2022-12-31: 1000 mL via INTRAVENOUS

## 2022-12-31 NOTE — ED Triage Notes (Signed)
Pt reports tachycardia with diaphoresis and LT finger numbness x 1 hour. Speech clear. Tx for URI x 1 week pta. Denies CP

## 2022-12-31 NOTE — Discharge Instructions (Signed)
As discussed, workup today overall reassuring.  Your enzymes are normal.  CT scan did not show evidence of blood clot in your lung.  Recommend continued use of your propranolol at home as needed for elevated heart rates as well as follow-up with cardiology in the outpatient setting.  Please call your cardiologist to schedule appointment at your earliest convenience.  Please do not hesitate to return to the emergency department if there are worrisome signs and symptoms we discussed become apparent.

## 2022-12-31 NOTE — ED Provider Notes (Signed)
[place] EMERGENCY DEPARTMENT AT [facility] Provider Note   CSN: [id number] Arrival date & time: 12/31/22  1149     History  Chief Complaint  Patient presents with   Tachycardia    Sharon Bennett is a 42 y.o. female.  HPI   42 year old female presents emergency department with complaints of tachycardia, chest pain, tingling left fingers.  Patient states that she was sitting at home on her computer working.  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  States that she felt central chest tightness with some radiation to her left neck as well as tingling sensation in the fingertips of her left hand around 11-11 30 that has been persistent since onset.  Patient states that she has had intermittent left arm tingling that has been going on for "months and months".  States that when symptoms began earlier today, felt some shortness of breath of which has resolved.  Reports some persistent chest tightness.  Denies any fever, chills, cough, congestion, abdominal pain, nausea, vomiting.  Patient does state that she has a history of intermittent tachycardia which she has as needed propranolol prescribed; did not take any of said medication since symptom onset.  Past medical history significant for colorectal cancer, tachycardia, GERD, anxiety, hypertension, epidural hematoma  Home Medications Prior to Admission medications   Medication Sig Start Date End Date Taking? Authorizing Provider  amitriptyline (ELAVIL) 25 MG tablet TAKE 1 TABLET BY MOUTH AT BEDTIME 10/16/22   Ladene Artist, MD  cyclobenzaprine (FLEXERIL) 10 MG tablet Take 1 tablet (10 mg total) by mouth 3 (three) times daily as needed for muscle spasms. 05/10/22   Ladene Artist, MD  diclofenac (VOLTAREN) 75 MG EC tablet TAKE 1 TABLET BY MOUTH 2 TIMES DAILY AS NEEDED. 11/21/21   Kathryne Hitch, MD  fexofenadine (ALLEGRA) 180 MG tablet Take 180 mg by mouth daily as needed for allergies or rhinitis.    [provider]  gabapentin (NEURONTIN) 300 MG capsule Take 1 capsule (300 mg total) by mouth at bedtime. 10/22/22   Rana Snare, NP  loperamide (IMODIUM) 2 MG capsule Take 1 capsule (2 mg total) by mouth 2 (two) times daily as needed for diarrhea or loose stools. 05/14/21   Gaynelle Adu, MD  ondansetron (ZOFRAN) 8 MG tablet Take 1 tablet (8 mg total) by mouth every 8 (eight) hours as needed for nausea or vomiting. Patient not taking: Reported on 04/27/2022 09/12/20   Ladene Artist, MD  pantoprazole (PROTONIX) 40 MG tablet Take 1 tablet (40 mg total) by mouth daily. Patient not taking: Reported on 04/27/2022 07/16/21   de [place], Buren Kos, MD  propranolol (INDERAL) 20 MG tablet Take 1 tablet (20 mg total) by mouth 2 (two) times daily as needed (heart rate at rest >120bpm, palpitations). Patient not taking: Reported on 04/27/2022 07/14/21   Alver Sorrow, NP      Allergies    Codeine, Dilaudid [hydromorphone], Mushroom extract complex, Oxycodone, and Tegaderm ag mesh [silver]    Review of Systems   Review of Systems  All other systems reviewed and are negative.   Physical Exam Updated Vital Signs BP 125/84 (BP Location: Left Arm)   Pulse 98   Temp 98.4 F (36.9 C) (Oral)   Resp 18   Ht 5\' 6"  (1.676 m)   Wt 68.5 kg   SpO2 100%   BMI 24.37 kg/m  Physical Exam Vitals and nursing note reviewed.  Constitutional:      General:  She is not in acute distress.    Appearance: She is well-developed.  HENT:     Head: Normocephalic and atraumatic.  Eyes:     Conjunctiva/sclera: Conjunctivae normal.  Cardiovascular:     Rate and Rhythm: Normal rate and regular rhythm.     Pulses: Normal pulses.     Heart sounds: No murmur heard. Pulmonary:     Effort: Pulmonary effort is normal. No respiratory distress.     Breath sounds: Normal breath sounds. No wheezing, rhonchi or rales.  Abdominal:     Palpations: Abdomen is soft.     Tenderness: There is no abdominal tenderness. There is no  guarding.  Musculoskeletal:        General: No swelling.     Cervical back: Neck supple.     Right lower leg: No edema.     Left lower leg: No edema.     Comments: No upper extremity edema.  Full range of motion of bilateral shoulders, elbows, wrist, digits.  Radial pulses 2+ bilaterally.  Skin:    General: Skin is warm and dry.     Capillary Refill: Capillary refill takes less than 2 seconds.  Neurological:     Mental Status: She is alert.  Psychiatric:        Mood and Affect: Mood normal.     ED Results / Procedures / Treatments   Labs (all labs ordered are listed, but only abnormal results are displayed) Labs Reviewed  CBC - Abnormal; Notable for the following components:      Result Value   WBC 15.0 (*)    All other components within normal limits  BASIC METABOLIC PANEL  PREGNANCY, URINE  TROPONIN I (HIGH SENSITIVITY)  TROPONIN I (HIGH SENSITIVITY)    EKG EKG Interpretation Date/Time:  Monday December 31 2022 11:59:20 EST Ventricular Rate:  115 PR Interval:  160 QRS Duration:  84 QT Interval:  328 QTC Calculation: 453 R Axis:   86  Text Interpretation: Sinus tachycardia Possible Left atrial enlargement Confirmed by Gloris Manchester [phone]) on 12/31/2022 12:03:49 PM  Radiology CT Angio Chest PE W/Cm &/Or Wo Cm  Result Date: 12/31/2022 CLINICAL DATA:  Pulmonary embolism suspected, high probability. Tachycardia with diaphoresis. Left finger numbness for 1 hour. Treated for upper respiratory infection week prior to arrival. History of colorectal cancer. EXAM: CT ANGIOGRAPHY CHEST WITH CONTRAST TECHNIQUE: Multidetector CT imaging of the chest was performed using the standard protocol during bolus administration of intravenous contrast. Multiplanar CT image reconstructions and MIPs were obtained to evaluate the vascular anatomy. RADIATION DOSE REDUCTION: This exam was performed according to the departmental dose-optimization program which includes automated exposure control,  adjustment of the mA and/or kV according to patient size and/or use of iterative reconstruction technique. CONTRAST:  75mL OMNIPAQUE IOHEXOL 350 MG/ML SOLN COMPARISON:  Chest radiographs 12/31/2022 and 04/19/2021; CT chest 07/20/2022 FINDINGS: Cardiovascular: The main pulmonary artery is opacified up to 326 Hounsfield units. No filling defect is seen to indicate a pulmonary embolism within the main, left main, right main, or lobar pulmonary arteries. Evaluation more distal pulmonary arteries is limited by contrast bolus timing. There is mildly greater opacification within the aorta compared to the main pulmonary artery. No thoracic aortic aneurysm or dissection is seen. Mediastinum/Nodes: No axillary, mediastinal, or hilar pathologically enlarged lymph nodes by CT criteria. The visualized thyroid is unremarkable. There is again mild residual thymic tissue. The esophagus follows a normal course and normal caliber. Lungs/Pleura: The central airways are patent. The lungs are  clear. No pleural effusion or pneumothorax. Upper Abdomen: No acute abnormality. Musculoskeletal: No chest wall abnormality. No acute or significant osseous findings. Review of the MIP images confirms the above findings. IMPRESSION: 1. No pulmonary embolism is seen within the main, left main, or lobar pulmonary arteries. Evaluation more distal pulmonary arteries is limited by contrast bolus timing. 2. No acute cardiopulmonary process. Electronically Signed   By: Neita Garnet M.D.   On: 12/31/2022 14:41   DG Chest 2 View  Result Date: 12/31/2022 CLINICAL DATA:  Tachycardia, dizziness. EXAM: CHEST - 2 VIEW COMPARISON:  April 19, 2021. FINDINGS: The heart size and mediastinal contours are within normal limits. Both lungs are clear. The visualized skeletal structures are unremarkable. IMPRESSION: No active cardiopulmonary disease. Electronically Signed   By: Lupita Raider M.D.   On: 12/31/2022 13:54    Procedures Procedures    Medications  Ordered in ED Medications  lactated ringers bolus 1,000 mL (0 mLs Intravenous Stopped 12/31/22 1456)  iohexol (OMNIPAQUE) 350 MG/ML injection 100 mL (75 mLs Intravenous Contrast Given 12/31/22 1348)  ketorolac (TORADOL) 15 MG/ML injection 15 mg (15 mg Intravenous Given 12/31/22 1508)    ED Course/ Medical Decision Making/ A&P                                 Medical Decision Making Amount and/or Complexity of Data Reviewed Labs: ordered. Radiology: ordered.  Risk Prescription drug management.   This patient presents to the ED for concern of chest pain, this involves an extensive number of treatment options, and is a complaint that carries with it a high risk of complications and morbidity.  The differential diagnosis includes ACS, PE, pneumothorax, aortic dissection, paranoia/myocarditis/tamponade, GERD, musculoskeletal, pleurisy,   Co morbidities that complicate the patient evaluation  See HPI   Additional history obtained:  Additional history obtained from EMR External records from outside source obtained and reviewed including hospital records   Lab Tests:  I Ordered, and personally interpreted labs.  The pertinent results include: Patient with initial troponin less than 2 with repeat less than 2.  No Electra abnormalities.  No renal dysfunction.  WBC of 15.  No evidence of anemia.  Platelets within range.  Urine pregnancy negative.   Imaging Studies ordered:  I ordered imaging studies including chest x-ray, CT angio chest PE I independently visualized and interpreted imaging which showed  Chest x-ray: No acute cardiopulmonary abnormalities CT angio chest PE: No evidence of PE or other acute cardiopulmonary abnormality I agree with the radiologist interpretation   Cardiac Monitoring: / EKG:  The patient was maintained on a cardiac monitor.  I personally viewed and interpreted the cardiac monitored which showed an underlying rhythm of: Sinus  tachycardia   Consultations Obtained:  N/a   Problem List / ED Course / Critical interventions / Medication management  Precordial chest pain I ordered medication including lactated Ringer's, Toradol Reevaluation of the patient after these medicines showed that the patient improved I have reviewed the patients home medicines and have made adjustments as needed   Social Determinants of Health:  Denies tobacco, licit drug use   Test / Admission - Considered:  Precordial chest pain Vitals signs significant for initially with a heart rate of 121 of which decreased to within normal range after administration of medicines while emergency department to 98. Otherwise within normal range and stable throughout visit. Laboratory/imaging studies significant for: See above 42 year old female presents  emergency department with complaints of chest pain as well as elevated heart rate.  Symptoms occurred when she was sitting while at work.  Patient initially with heart rates in the 120s.  Patient with history of tachycardia given propranolol as needed by cardiology in the outpatient setting but patient has never taken some medication because her elevated heart rate seems to be somewhat transient when it occurs.  Patient's workup today overall reassuring.  Patient with delta negative troponin, lack of acute ischemic changes on EKG; low suspicion for ACS.  Patient with heart score of 0-3.  Patient is with history of colorectal cancer, given patient's left-sided chest pain in the setting of tachycardia, CT angio chest PE study was obtained which was negative for PE.  Patient without chest pain rating to back, hypertension, neurologic deficits, pulse deficits; low suspicion for aortic dissection.  CT imaging without evidence of pneumonia, pneumothorax or other abnormality.  Patient reassured by workup.  Will recommend follow-up with primary care/cardiology in the outpatient setting for reassessment.  Treatment  plan discussed at length with patient and she acknowledged understanding was agreeable to said plan.  Patient overall well-appearing, afebrile in no acute distress. Worrisome signs and symptoms were discussed with the patient, and the patient acknowledged understanding to return to the ED if noticed. Patient was stable upon discharge.          Final Clinical Impression(s) / ED Diagnoses Final diagnoses:  Precordial chest pain  Tachycardia    Rx / DC Orders ED Discharge Orders     None         Peter Garter, [place] 12/31/22 1548    Gloris Manchester, MD 12/31/22 1559

## 2023-01-09 ENCOUNTER — Telehealth: Payer: Self-pay

## 2023-01-09 NOTE — Telephone Encounter (Signed)
Left voicemail notifying patient that her [employer] Documents had been completed and faxed back to company. Fax confirmation received. Copy of documents mailed to patient.

## 2023-01-16 ENCOUNTER — Telehealth: Payer: Self-pay

## 2023-01-16 NOTE — Telephone Encounter (Signed)
Patient requested FMLA be faxed to new fax number along with additional documents sent to [place]. All documents have been completed and faxed back to [phone] as requested. Fax confirmation received. No further concerns at this time.

## 2023-01-19 ENCOUNTER — Ambulatory Visit (HOSPITAL_BASED_OUTPATIENT_CLINIC_OR_DEPARTMENT_OTHER)
Admission: RE | Admit: 2023-01-19 | Discharge: 2023-01-19 | Disposition: A | Discharge status: Home / Self Care | Payer: No Typology Code available for payment source | Source: Ambulatory Visit | Attending: Nurse Practitioner | Admitting: Nurse Practitioner

## 2023-01-19 DIAGNOSIS — C20 Malignant neoplasm of rectum: Secondary | ICD-10-CM

## 2023-01-19 MED ORDER — IOHEXOL 300 MG/ML  SOLN
80.0000 mL | Freq: Once | INTRAMUSCULAR | Status: AC | PRN
  Administered 2023-01-19: 80 mL via INTRAVENOUS

## 2023-01-22 ENCOUNTER — Inpatient Hospital Stay: Payer: No Typology Code available for payment source

## 2023-01-22 ENCOUNTER — Inpatient Hospital Stay: Payer: No Typology Code available for payment source | Admitting: Oncology

## 2023-01-23 ENCOUNTER — Inpatient Hospital Stay (HOSPITAL_BASED_OUTPATIENT_CLINIC_OR_DEPARTMENT_OTHER): Payer: No Typology Code available for payment source | Admitting: Oncology

## 2023-01-23 ENCOUNTER — Inpatient Hospital Stay: Payer: No Typology Code available for payment source | Attending: Oncology

## 2023-01-23 VITALS — BP 113/80 | HR 100 | Temp 98.2°F | Resp 20 | Ht 66.0 in | Wt 154.7 lb

## 2023-01-23 DIAGNOSIS — C20 Malignant neoplasm of rectum: Secondary | ICD-10-CM | POA: Insufficient documentation

## 2023-01-23 DIAGNOSIS — G620 Drug-induced polyneuropathy: Secondary | ICD-10-CM | POA: Insufficient documentation

## 2023-01-23 DIAGNOSIS — T451X5A Adverse effect of antineoplastic and immunosuppressive drugs, initial encounter: Secondary | ICD-10-CM | POA: Diagnosis not present

## 2023-01-23 LAB — CBC WITH DIFFERENTIAL (CANCER CENTER ONLY)
Abs Immature Granulocytes: 0 10*3/uL (ref 0.00–0.07)
Basophils Absolute: 0 10*3/uL (ref 0.0–0.1)
Basophils Relative: 0 %
Eosinophils Absolute: 0 10*3/uL (ref 0.0–0.5)
Eosinophils Relative: 1 %
HCT: 38.8 % (ref 36.0–46.0)
Hemoglobin: 12.2 g/dL (ref 12.0–15.0)
Immature Granulocytes: 0 %
Lymphocytes Relative: 31 %
Lymphs Abs: 1.4 10*3/uL (ref 0.7–4.0)
MCH: 27.4 pg (ref 26.0–34.0)
MCHC: 31.4 g/dL (ref 30.0–36.0)
MCV: 87.2 fL (ref 80.0–100.0)
Monocytes Absolute: 0.4 10*3/uL (ref 0.1–1.0)
Monocytes Relative: 9 %
Neutro Abs: 2.6 10*3/uL (ref 1.7–7.7)
Neutrophils Relative %: 59 %
Platelet Count: 244 10*3/uL (ref 150–400)
RBC: 4.45 MIL/uL (ref 3.87–5.11)
RDW: 14.7 % (ref 11.5–15.5)
WBC Count: 4.5 10*3/uL (ref 4.0–10.5)
nRBC: 0 % (ref 0.0–0.2)

## 2023-01-23 LAB — CEA (ACCESS): CEA (CHCC): <1 ng/mL (ref 0.00–5.00)

## 2023-01-23 NOTE — Progress Notes (Signed)
[facility] OFFICE PROGRESS NOTE   Diagnosis: Rectal cancer  INTERVAL HISTORY:   Sharon Bennett returns as scheduled.  She complains of pain with bowel movements.  She continues to have "cluster" of bowel movements.  She was seen in the emergency room 12/31/2022 with tachycardia and feeling "lightheaded ".  She went to an urgent care several days later after the white count returned elevated at 15,000 on 12/31/2022.  She reports a repeat CBC found a white count at 9.  She continues to have neuropathy symptoms.  She has numbness and tingling, chiefly in the feet.  Gabapentin helps.  She continues amitriptyline.  She reports her primary provider recommended she begin Lyrica in place of the gabapentin.  Objective:  Vital signs in last 24 hours:  Blood pressure 113/80, pulse 100, temperature 98.2 F (36.8 C), temperature source Temporal, resp. rate 20, height 5\' 6"  (1.676 m), weight 154 lb 11.2 oz (70.2 kg), SpO2 100%.    Lymphatics: "Shotty "right posterior cervical left scalene, and bilateral axillary nodes Resp: Lungs clear bilaterally Cardio: Regular rate and rhythm GI: No hepatosplenomegaly Vascular: No leg edema     Lab Results:  Lab Results  Component Value Date   WBC 15.0 (H) 12/31/2022   HGB 13.1 12/31/2022   HCT 40.7 12/31/2022   MCV 85.9 12/31/2022   PLT 237 12/31/2022   NEUTROABS 1.8 10/22/2022    CMP  Lab Results  Component Value Date   NA 137 12/31/2022   K 3.8 12/31/2022   CL 102 12/31/2022   CO2 24 12/31/2022   GLUCOSE 82 12/31/2022   BUN 8 12/31/2022   CREATININE 0.65 12/31/2022   CALCIUM 9.6 12/31/2022   PROT 7.2 10/22/2022   ALBUMIN 4.4 10/22/2022   AST 15 10/22/2022   ALT 13 10/22/2022   ALKPHOS 74 10/22/2022   BILITOT 0.5 10/22/2022   GFRNONAA >60 12/31/2022    Lab Results  Component Value Date   CEA <1.00 10/22/2022      Medications: I have reviewed the patient's current medications.   Assessment/Plan: Rectal cancer   CT abdomen/pelvis 08/21/2020-large rectal mass measuring 4.4 x 2.6 x 5.5 cm, haziness in the mesorectal fat, obscuration of the fat plane between the mass and the adjacent posterior aspect of the uterus, numerous prominent mesorectal lymph nodes measuring up to 5 mm Colonoscopy 08/23/2020-fungating infiltrative nonobstructing large mass found in the rectum.  Mass was noncircumferential, measured 5 cm in length, 3 cm in diameter and was 7 cm from the anal verge.  No bleeding was present.   Pathology showed invasive colonic adenocarcinoma, moderately differentiated.  No evidence of mismatch repair deficiency. Chest CT 09/01/2020-no evidence of metastatic disease MRI pelvis 09/02/2020-tumor at 10.4 cm from anal verge, 5.9 cm from the internal anal sphincter, T3, approximately 15 greater than 5 mm lymph nodes, N2b, multiple uterine fibroids Cycle 1 FOLFOX 09/15/2020 Cycle 2 FOLFOX 09/29/2020 Cycle 3 FOLFOX 10/13/2020 Cycle 4 FOLFOX 10/27/2020 Cycle 5 FOLFOX 11/10/2020 Cycle 6 FOLFOX 11/24/2020 Cycle 7 FOLFOX 12/08/2020 Cycle 8 FOLFOX 01/05/2021, Ziextenzo Radiation/Xeloda 01/23/2021-03/03/2021 Robotic assisted low anterior resection/loop ileostomy 05/10/2021-invasive adenocarcinoma, tumor extends into muscularis, negative resection margins, 0/19 nodes, grade 2, no lymphovascular perineural invasion, normal mismatch repair protein expression,pT2pN0 Ileostomy takedown 07/31/2021 CTs 08/25/2021-no evidence of metastatic disease 08/29/2021 guardant reveal-negative Colonoscopy 11/07/2021: Healthy appearing mucosa at the anastomosis 7 cm from the anal verge, no specimen collected 11/29/2021 guardant reveal negative CTs 01/17/2022-no findings of recurrent or metastatic disease, 7 mm right renal lesion  CT 07/20/2022-no evidence of recurrent disease in the chest, abdomen, or pelvis CTs 01/19/2023-no evidence of recurrent disease 2.   rectal bleeding, change in bowel habits secondary to #1 3.   Uterine fibroids noted on  MRI pelvis 09/02/2020 4.   Port-A-Cath placement 09/09/2020, Interventional Radiology, Port-A-Cath removed 04/04/2021 5.  Admission 12/14/2020 with febrile neutropenia, E. coli bacteremia-completed an outpatient course of Augmentin 6.  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  Right upper extremity pain-03/31/2021 MRI cervical spine-no impingement, inflammation or mass to explain symptoms; disc bulging and ridging at C4-5 to C6-7.  C6-7 posterior cervical laminotomy and foraminotomy and microdiscectomy 04/17/2021 8.  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  The white count was elevated when she was seen in the emergency room last month.  She requested we obtain a repeat CBC today.  She will begin a trial of Lyrica in addition to amitriptyline for neuropathy.  She understands the potential for drowsiness with the combination of Lyrica and amitriptyline.  She will return for an office visit and CEA in 6 months.  We will plan for surveillance imaging in 1 year.  She is scheduled to see Dr. Elnoria Howard today for evaluation of rectal pain and bleeding.  She will follow-up with Dr. Elnoria Howard for colonoscopy surveillance.  Thornton Papas, MD  01/23/2023  10:51 AM

## 2023-04-09 LAB — HM SIGMOIDOSCOPY

## 2023-04-16 ENCOUNTER — Encounter: Payer: Self-pay | Admitting: Oncology

## 2023-04-16 ENCOUNTER — Other Ambulatory Visit: Payer: Self-pay | Admitting: Oncology

## 2023-04-16 DIAGNOSIS — C20 Malignant neoplasm of rectum: Secondary | ICD-10-CM

## 2023-04-16 NOTE — Telephone Encounter (Signed)
 04/16/23: Left VM for patient to call with reason she is taking this medication.

## 2023-04-22 ENCOUNTER — Other Ambulatory Visit: Payer: Self-pay | Admitting: Oncology

## 2023-04-22 DIAGNOSIS — C20 Malignant neoplasm of rectum: Secondary | ICD-10-CM

## 2023-04-30 ENCOUNTER — Encounter: Payer: Self-pay | Admitting: *Deleted

## 2023-05-07 ENCOUNTER — Other Ambulatory Visit: Payer: Self-pay | Admitting: *Deleted

## 2023-05-07 DIAGNOSIS — C20 Malignant neoplasm of rectum: Secondary | ICD-10-CM

## 2023-06-10 ENCOUNTER — Inpatient Hospital Stay: Attending: Oncology

## 2023-06-10 DIAGNOSIS — C20 Malignant neoplasm of rectum: Secondary | ICD-10-CM | POA: Diagnosis present

## 2023-06-10 LAB — CEA (ACCESS): CEA (CHCC): <1 ng/mL (ref 0.00–5.00)

## 2023-06-11 ENCOUNTER — Other Ambulatory Visit

## 2023-07-15 ENCOUNTER — Encounter: Payer: Self-pay | Admitting: Oncology

## 2023-07-21 ENCOUNTER — Other Ambulatory Visit: Payer: Self-pay | Admitting: Oncology

## 2023-07-21 DIAGNOSIS — C20 Malignant neoplasm of rectum: Secondary | ICD-10-CM

## 2023-07-25 ENCOUNTER — Other Ambulatory Visit: Payer: No Typology Code available for payment source

## 2023-07-25 ENCOUNTER — Inpatient Hospital Stay: Attending: Oncology | Admitting: Oncology

## 2023-07-25 ENCOUNTER — Ambulatory Visit: Payer: No Typology Code available for payment source | Admitting: Oncology

## 2023-07-25 VITALS — BP 100/76 | HR 80 | Temp 97.9°F | Resp 18 | Ht 66.0 in | Wt 161.0 lb

## 2023-07-25 DIAGNOSIS — C20 Malignant neoplasm of rectum: Secondary | ICD-10-CM | POA: Insufficient documentation

## 2023-07-25 NOTE — Progress Notes (Signed)
 [facility] OFFICE PROGRESS NOTE   Diagnosis: Rectal cancer  INTERVAL HISTORY:   Ms. Drum returns as scheduled.  She reports improvement in bowel function since starting Trulance.  The anal fissure has healed.  She continues to have neuropathy symptoms in the feet greater than the hands.  Good appetite and energy level.  Objective:  Vital signs in last 24 hours:  Blood pressure 100/76, pulse 80, temperature 97.9 F (36.6 C), temperature source Temporal, resp. rate 18, height 5' 6 (1.676 m), weight 161 lb (73 kg), SpO2 100%.   Lymphatics: Shotty bilateral axillary nodes, no cervical, supraclavicular, or inguinal nodes Resp: Lungs clear bilaterally Cardio: Regular rate and rhythm GI: No hepatosplenomegaly, no mass, nontender Vascular: No leg edema   Lab Results:  Lab Results  Component Value Date   WBC 4.5 01/23/2023   HGB 12.2 01/23/2023   HCT 38.8 01/23/2023   MCV 87.2 01/23/2023   PLT 244 01/23/2023   NEUTROABS 2.6 01/23/2023    CMP  Lab Results  Component Value Date   NA 137 12/31/2022   K 3.8 12/31/2022   CL 102 12/31/2022   CO2 24 12/31/2022   GLUCOSE 82 12/31/2022   BUN 8 12/31/2022   CREATININE 0.65 12/31/2022   CALCIUM  9.6 12/31/2022   PROT 7.2 10/22/2022   ALBUMIN  4.4 10/22/2022   AST 15 10/22/2022   ALT 13 10/22/2022   ALKPHOS 74 10/22/2022   BILITOT 0.5 10/22/2022   GFRNONAA >60 12/31/2022    Lab Results  Component Value Date   CEA <1.00 06/10/2023    Medications: I have reviewed the patient's current medications.   Assessment/Plan: Rectal cancer  CT abdomen/pelvis 08/21/2020-large rectal mass measuring 4.4 x 2.6 x 5.5 cm, haziness in the mesorectal fat, obscuration of the fat plane between the mass and the adjacent posterior aspect of the uterus, numerous prominent mesorectal lymph nodes measuring up to 5 mm Colonoscopy 08/23/2020-fungating infiltrative nonobstructing large mass found in the rectum.  Mass was  noncircumferential, measured 5 cm in length, 3 cm in diameter and was 7 cm from the anal verge.  No bleeding was present.   Pathology showed invasive colonic adenocarcinoma, moderately differentiated.  No evidence of mismatch repair deficiency. Chest CT 09/01/2020-no evidence of metastatic disease MRI pelvis 09/02/2020-tumor at 10.4 cm from anal verge, 5.9 cm from the internal anal sphincter, T3, approximately 15 greater than 5 mm lymph nodes, N2b, multiple uterine fibroids Cycle 1 FOLFOX 09/15/2020 Cycle 2 FOLFOX 09/29/2020 Cycle 3 FOLFOX 10/13/2020 Cycle 4 FOLFOX 10/27/2020 Cycle 5 FOLFOX 11/10/2020 Cycle 6 FOLFOX 11/24/2020 Cycle 7 FOLFOX 12/08/2020 Cycle 8 FOLFOX 01/05/2021, Ziextenzo  Radiation/Xeloda  01/23/2021-03/03/2021 Robotic assisted low anterior resection/loop ileostomy 05/10/2021-invasive adenocarcinoma, tumor extends into muscularis, negative resection margins, 0/19 nodes, grade 2, no lymphovascular perineural invasion, normal mismatch repair protein expression,pT2pN0 Ileostomy takedown 07/31/2021 CTs 08/25/2021-no evidence of metastatic disease 08/29/2021 guardant reveal-negative Colonoscopy 11/07/2021: Healthy appearing mucosa at the anastomosis 7 cm from the anal verge, no specimen collected 11/29/2021 guardant reveal negative CTs 01/17/2022-no findings of recurrent or metastatic disease, 7 mm right renal lesion  CT 07/20/2022-no evidence of recurrent disease in the chest, abdomen, or pelvis CTs 01/19/2023-no evidence of recurrent disease 04/09/2023 sigmoidoscopy: Anal fissure 2.   rectal bleeding, change in bowel habits secondary to #1 3.   Uterine fibroids noted on MRI pelvis 09/02/2020 4.   Port-A-Cath placement 09/09/2020, Interventional Radiology, Port-A-Cath removed 04/04/2021 5.  Admission 12/14/2020 with febrile neutropenia, E. coli bacteremia-completed an outpatient course of Augmentin  6.  Right IJ DVT 02/09/2021-apixaban ; Right upper extremity Doppler 03/30/2021-improvement in right IJ  thrombus, no other 7.  Right upper extremity pain-03/31/2021 MRI cervical spine-no impingement, inflammation or mass to explain symptoms; disc bulging and ridging at C4-5 to C6-7.  C6-7 posterior cervical laminotomy and foraminotomy and microdiscectomy 04/17/2021 8.  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 Polhamus remains in clinical remission from rectal cancer.  She will continue colonoscopy surveillance with Dr. Nickey Barn.  Irregular bowel habits and the anal fissure have improved.  She will return for an office visit and surveillance CTs in 6 months.  Coni Deep, MD  07/25/2023  8:17 AM

## 2023-08-07 ENCOUNTER — Encounter: Payer: Self-pay | Admitting: Dermatology

## 2023-08-07 ENCOUNTER — Ambulatory Visit: Admitting: Dermatology

## 2023-08-07 VITALS — BP 132/85 | HR 85

## 2023-08-07 DIAGNOSIS — D2230 Melanocytic nevi of unspecified part of face: Secondary | ICD-10-CM

## 2023-08-07 DIAGNOSIS — D492 Neoplasm of unspecified behavior of bone, soft tissue, and skin: Secondary | ICD-10-CM | POA: Diagnosis not present

## 2023-08-07 DIAGNOSIS — R21 Rash and other nonspecific skin eruption: Secondary | ICD-10-CM

## 2023-08-07 DIAGNOSIS — R238 Other skin changes: Secondary | ICD-10-CM

## 2023-08-07 DIAGNOSIS — Z1283 Encounter for screening for malignant neoplasm of skin: Secondary | ICD-10-CM | POA: Diagnosis not present

## 2023-08-07 DIAGNOSIS — D2271 Melanocytic nevi of right lower limb, including hip: Secondary | ICD-10-CM | POA: Diagnosis not present

## 2023-08-07 DIAGNOSIS — L811 Chloasma: Secondary | ICD-10-CM

## 2023-08-07 DIAGNOSIS — D2272 Melanocytic nevi of left lower limb, including hip: Secondary | ICD-10-CM | POA: Diagnosis not present

## 2023-08-07 DIAGNOSIS — D225 Melanocytic nevi of trunk: Secondary | ICD-10-CM

## 2023-08-07 DIAGNOSIS — D229 Melanocytic nevi, unspecified: Secondary | ICD-10-CM

## 2023-08-07 DIAGNOSIS — L578 Other skin changes due to chronic exposure to nonionizing radiation: Secondary | ICD-10-CM

## 2023-08-07 DIAGNOSIS — L858 Other specified epidermal thickening: Secondary | ICD-10-CM | POA: Diagnosis not present

## 2023-08-07 DIAGNOSIS — W908XXA Exposure to other nonionizing radiation, initial encounter: Secondary | ICD-10-CM

## 2023-08-07 DIAGNOSIS — D485 Neoplasm of uncertain behavior of skin: Secondary | ICD-10-CM

## 2023-08-07 MED ORDER — TRIAMCINOLONE ACETONIDE 0.1 % EX CREA: 1.0000 | TOPICAL_CREAM | Freq: Two times a day (BID) | CUTANEOUS | 1 refills | Status: AC

## 2023-08-07 NOTE — Progress Notes (Signed)
 New Patient Visit   Subjective  Sharon Bennett is a 43 y.o. female who presents for the following: Total Body Skin Exam (TBSE)  Patient present today for new patient visit for TBSE.The patient reports she has spots, moles and lesions to be evaluated, some may be new or changing. Patient has previously been treated by a dermatologist.Patient reports she does not have hx of bx. Patient denies family history of skin cancers. Patient reports throughout her lifetime has had minimal sun exposure. Currently, patient reports if she has excessive sun exposure, she does apply sunscreen and/or wears protective coverings.  The following portions of the chart were reviewed this encounter and updated as appropriate: medications, allergies, medical history  Review of Systems:  No other skin or systemic complaints except as noted in HPI or Assessment and Plan.  Objective  Well appearing patient in no apparent distress; mood and affect are within normal limits.  A full examination was performed including scalp, head, eyes, ears, nose, lips, neck, chest, axillae, abdomen, back, buttocks, bilateral upper extremities, bilateral lower extremities, hands, feet, fingers, toes, fingernails, and toenails. All findings within normal limits unless otherwise noted below.   Relevant exam findings are noted in the Assessment and Plan.  Right Hallux Metatarsal Plantar 5 mm tan brown papule  Left posterior heel region 6 mm Tan Brown Macule   Assessment & Plan   MELANOCYTIC NEVI Located on the face and trunk - Tan-brown and/or pink-flesh-colored symmetric macules and papules - Benign appearing on exam today - Observation - Call clinic for new or changing moles - Recommend daily use of broad spectrum spf 30+ sunscreen to sun-exposed areas.   ACTINIC DAMAGE - Chronic condition, secondary to cumulative UV/sun exposure - diffuse scaly erythematous macules with underlying dyspigmentation - Recommend daily broad  spectrum sunscreen SPF 30+ to sun-exposed areas, reapply every 2 hours as needed.  - Staying in the shade or wearing long sleeves, sun glasses (UVA+UVB protection) and wide brim hats (4-inch brim around the entire circumference of the hat) are also recommended for sun protection.  - Call for new or changing lesions.  MELASMA Exam: reticulated hyperpigmented patches at face  Flared  Melasma is a chronic; persistent condition of hyperpigmented patches generally on the face, worse in summer due to higher UV exposure.    Heredity; thyroid  disease; sun exposure; pregnancy; birth control pills; epilepsy medication and darker skin may predispose to Melasma.   Recommendations include: - Sun avoidance and daily broad spectrum (UVA/UVB) tinted mineral sunscreen SPF 30+, with Zinc  or Titanium Dioxide.  Treatment Plan: - Recommended applying sunscreen daily for preventative measures  KERATOSIS PILARIS - Tiny follicular keratotic papules - Benign. Genetic in nature. No cure. - Observe. - If desired, patient can use an emollient (moisturizer) containing ammonium lactate (AmLactin), urea or salicylic acid once a day to smooth the area  Recommend starting moisturizer with exfoliant (Urea, Salicylic acid, or Lactic acid) one to two times daily to help smooth rough and bumpy skin.  OTC options include Cetaphil Rough and Bumpy lotion (Urea), Eucerin Roughness Relief lotion or spot treatment cream (Urea), CeraVe SA lotion/cream for Rough and Bumpy skin (Sal Acid), Gold Bond Rough and Bumpy cream (Sal Acid), and AmLactin 12% lotion/cream (Lactic Acid).  Monomorphic Red papules with vascular steal on posterior right thigh- Ddx arthropod reaction vs other - Triamcinolone  0.1 % cream BID and return in 1 month to recheck  SKIN CANCER SCREENING PERFORMED TODAY  NEOPLASM OF UNCERTAIN BEHAVIOR OF SKIN (2)  Right Hallux Metatarsal Plantar Epidermal / dermal shaving  Lesion diameter (cm):  0.5 Informed consent:  discussed and consent obtained   Timeout: patient name, date of birth, surgical site, and procedure verified   Instrument used: DermaBlade   Hemostasis achieved with: aluminum chloride   Outcome: patient tolerated procedure well   Post-procedure details: wound care instructions given    Specimen A - Surgical pathology Differential Diagnosis: R/O DN  Check Margins: No Left posterior heel region Epidermal / dermal shaving  Lesion diameter (cm):  0.6 Informed consent: discussed and consent obtained   Timeout: patient name, date of birth, surgical site, and procedure verified   Instrument used: DermaBlade   Outcome: patient tolerated procedure well   Post-procedure details: wound care instructions given    Specimen B - Surgical pathology Differential Diagnosis: R/O DN  Check Margins: No KERATOSIS PILARIS   MELASMA   ACTINIC SKIN DAMAGE   MULTIPLE BENIGN NEVI    Return in about 4 weeks (around 09/04/2023) for Rash Follow up.  I, Jetta Ager, am acting as scribe for Sharon CHRISTELLA HOLY, MD.  Documentation: I have reviewed the above documentation for accuracy and completeness, and I agree with the above.  Sharon CHRISTELLA HOLY, MD

## 2023-08-07 NOTE — Patient Instructions (Signed)

## 2023-08-13 LAB — SURGICAL PATHOLOGY

## 2023-08-18 ENCOUNTER — Ambulatory Visit: Payer: Self-pay | Admitting: Dermatology

## 2023-09-12 ENCOUNTER — Ambulatory Visit: Admitting: Dermatology

## 2023-10-10 ENCOUNTER — Encounter: Payer: Self-pay | Admitting: Dermatology

## 2023-10-10 ENCOUNTER — Ambulatory Visit: Admitting: Dermatology

## 2023-10-10 VITALS — BP 130/81 | HR 83

## 2023-10-10 DIAGNOSIS — R21 Rash and other nonspecific skin eruption: Secondary | ICD-10-CM

## 2023-10-10 DIAGNOSIS — L732 Hidradenitis suppurativa: Secondary | ICD-10-CM | POA: Diagnosis not present

## 2023-10-10 DIAGNOSIS — Z872 Personal history of diseases of the skin and subcutaneous tissue: Secondary | ICD-10-CM | POA: Diagnosis not present

## 2023-10-10 MED ORDER — DOXYCYCLINE HYCLATE 100 MG PO TABS: 100.0000 mg | ORAL_TABLET | Freq: Two times a day (BID) | ORAL | 4 refills | Status: AC

## 2023-10-10 MED ORDER — CLINDAMYCIN PHOSPHATE 1 % EX LOTN: TOPICAL_LOTION | Freq: Every day | CUTANEOUS | 3 refills | Status: AC

## 2023-10-10 NOTE — Progress Notes (Signed)
   Follow-Up Visit   Subjective  Sharon Bennett is a 43 y.o. female who presents for the following: Here to follow up on treatment for rash. Rash on left resolved with triamcinolone .   She mentions some tender lesions that come and go under her bilateral arms, R > L, and sometimes in her groin. She has been dealing with it for several years, not previously treated with anything prescription.   The following portions of the chart were reviewed this encounter and updated as appropriate: medications, allergies, medical history  Review of Systems:  No other skin or systemic complaints except as noted in HPI or Assessment and Plan.  Objective  Well appearing patient in no apparent distress; mood and affect are within normal limits.  A focused examination was performed of the following areas: Right thigh Right axillae  Relevant exam findings are noted in the Assessment and Plan.    Assessment & Plan   HIDRADENITIS SUPPURATIVA Exam:   flared   Hidradenitis Suppurativa is a chronic; persistent; non-curable, but treatable condition due to abnormal inflamed sweat glands in the body folds (axilla, inframammary, groin, medial thighs), causing recurrent painful draining cysts and scarring. It can be associated with severe scarring acne and cysts; also abscesses and scarring of scalp. The goal is control and prevention of flares, as it is not curable. Scars are permanent and can be thickened. Treatment may include daily use of topical medication and oral antibiotics.  Oral isotretinoin may also be helpful.  For some cases, Humira or Cosentyx (biologic injections) may be prescribed to decrease the inflammatory process and prevent flares.  When indicated, inflamed cysts may also be treated surgically.  Treatment Plan: -start using Hibiclens  to wash the areas. - Start using clindamycin  lotion to the area -Take 100 mg doxy BID for 10 days for flairs   Monomorphic Red papules with vascular steal  on posterior right thigh- Ddx arthropod reaction vs other - Resolved    Return in about 5 months (around 03/11/2024) for Follow up for HS.  I, Berwyn Lesches, Surg Tech III, am acting as scribe for [name], MD.   Documentation: I have reviewed the above documentation for accuracy and completeness, and I agree with the above.  [name], MD

## 2023-10-10 NOTE — Patient Instructions (Signed)

## 2023-11-20 ENCOUNTER — Other Ambulatory Visit: Payer: Self-pay | Admitting: *Deleted

## 2023-11-20 DIAGNOSIS — C20 Malignant neoplasm of rectum: Secondary | ICD-10-CM

## 2023-11-20 NOTE — Progress Notes (Signed)
 Notification from [facility] that next draw needs to be done by 01/10/24 or it will be canceled. Spoke w/Norelle and she agrees to come on 12/02 for draw.

## 2023-12-13 ENCOUNTER — Telehealth: Payer: Self-pay

## 2023-12-13 NOTE — Telephone Encounter (Signed)
 The patient called to inform us  that she has been experiencing frequent pain for several months and followed up with her primary care provider. The provider ordered a CT scan, which revealed some new findings. The patient was inquiring whether these findings might be the cause of her pain. I attempted to access the scan report through [facility]'s system but was unable to locate the image. An additional [facility] nurse practitioner also checked in the same system without success. The patient has stated she will bring her CD so we can upload the images to her chart.

## 2023-12-16 ENCOUNTER — Inpatient Hospital Stay: Attending: Oncology

## 2023-12-16 ENCOUNTER — Other Ambulatory Visit: Payer: Self-pay

## 2023-12-16 ENCOUNTER — Inpatient Hospital Stay
Admission: RE | Admit: 2023-12-16 | Discharge: 2023-12-16 | Disposition: A | Payer: Self-pay | Source: Ambulatory Visit | Attending: Oncology | Admitting: Oncology

## 2023-12-16 ENCOUNTER — Other Ambulatory Visit: Payer: Self-pay | Admitting: *Deleted

## 2023-12-16 ENCOUNTER — Ambulatory Visit: Payer: Self-pay | Admitting: Oncology

## 2023-12-16 ENCOUNTER — Emergency Department (HOSPITAL_COMMUNITY)
Admission: EM | Admit: 2023-12-16 | Discharge: 2023-12-17 | Disposition: A | Discharge status: Home / Self Care | Attending: Emergency Medicine | Admitting: Emergency Medicine

## 2023-12-16 DIAGNOSIS — R10A1 Flank pain, right side: Secondary | ICD-10-CM

## 2023-12-16 DIAGNOSIS — C20 Malignant neoplasm of rectum: Secondary | ICD-10-CM | POA: Insufficient documentation

## 2023-12-16 DIAGNOSIS — R079 Chest pain, unspecified: Secondary | ICD-10-CM | POA: Insufficient documentation

## 2023-12-16 DIAGNOSIS — K769 Liver disease, unspecified: Secondary | ICD-10-CM | POA: Insufficient documentation

## 2023-12-16 DIAGNOSIS — E876 Hypokalemia: Secondary | ICD-10-CM | POA: Insufficient documentation

## 2023-12-16 DIAGNOSIS — R030 Elevated blood-pressure reading, without diagnosis of hypertension: Secondary | ICD-10-CM | POA: Insufficient documentation

## 2023-12-16 DIAGNOSIS — Z85038 Personal history of other malignant neoplasm of large intestine: Secondary | ICD-10-CM | POA: Insufficient documentation

## 2023-12-16 LAB — URINALYSIS, ROUTINE W REFLEX MICROSCOPIC
Bilirubin Urine: NEGATIVE
Glucose, UA: NEGATIVE mg/dL
Hgb urine dipstick: NEGATIVE
Ketones, ur: 5 mg/dL — AB
Nitrite: NEGATIVE
Protein, ur: NEGATIVE mg/dL
Specific Gravity, Urine: 1.019 (ref 1.005–1.030)
pH: 6 (ref 5.0–8.0)

## 2023-12-16 LAB — CBC
HCT: 41 % (ref 36.0–46.0)
Hemoglobin: 13.2 g/dL (ref 12.0–15.0)
MCH: 28.5 pg (ref 26.0–34.0)
MCHC: 32.2 g/dL (ref 30.0–36.0)
MCV: 88.6 fL (ref 80.0–100.0)
Platelets: 242 K/uL (ref 150–400)
RBC: 4.63 MIL/uL (ref 3.87–5.11)
RDW: 14 % (ref 11.5–15.5)
WBC: 5.2 K/uL (ref 4.0–10.5)
nRBC: 0 % (ref 0.0–0.2)

## 2023-12-16 LAB — COMPREHENSIVE METABOLIC PANEL WITH GFR
ALT: 14 U/L (ref 0–44)
AST: 27 U/L (ref 15–41)
Albumin: 4.6 g/dL (ref 3.5–5.0)
Alkaline Phosphatase: 106 U/L (ref 38–126)
Anion gap: 14 (ref 5–15)
BUN: 6 mg/dL (ref 6–20)
CO2: 24 mmol/L (ref 22–32)
Calcium: 9.6 mg/dL (ref 8.9–10.3)
Chloride: 104 mmol/L (ref 98–111)
Creatinine, Ser: 0.74 mg/dL (ref 0.44–1.00)
GFR, Estimated: >60 mL/min (ref >60)
Glucose, Bld: 94 mg/dL (ref 70–99)
Potassium: 3.2 mmol/L — ABNORMAL LOW (ref 3.5–5.1)
Sodium: 142 mmol/L (ref 135–145)
Total Bilirubin: 0.5 mg/dL (ref 0.0–1.2)
Total Protein: 7.8 g/dL (ref 6.5–8.1)

## 2023-12-16 LAB — CEA (ACCESS): CEA (CHCC): 1.02 ng/mL (ref 0.00–5.00)

## 2023-12-16 LAB — TROPONIN T, HIGH SENSITIVITY: Troponin T High Sensitivity: <15 ng/L (ref 0–19)

## 2023-12-16 MED ORDER — POTASSIUM CHLORIDE CRYS ER 20 MEQ PO TBCR: 20.0000 meq | EXTENDED_RELEASE_TABLET | Freq: Two times a day (BID) | ORAL | 0 refills | Status: DC

## 2023-12-16 MED ORDER — POTASSIUM CHLORIDE CRYS ER 20 MEQ PO TBCR
40.0000 meq | EXTENDED_RELEASE_TABLET | Freq: Once | ORAL | Status: AC
  Administered 2023-12-17: 40 meq via ORAL
  Filled 2023-12-16: qty 2

## 2023-12-16 MED ORDER — PANTOPRAZOLE SODIUM 40 MG PO TBEC: 40.0000 mg | DELAYED_RELEASE_TABLET | Freq: Every day | ORAL | 3 refills | Status: AC

## 2023-12-16 NOTE — Discharge Instructions (Addendum)
 Please take either ibuprofen  or naproxen as needed for pain.  Add acetaminophen  if you need additional pain relief.  Please see your gastroenterologist regarding your feeling full and belching.  Also, discussed with him whether you need additional testing regarding the lesion of your liver and whether you need repeat colonoscopy.  Please discuss with your oncologist what to do regarding your CEA level and the lesion of the liver.  Your blood pressure was significantly elevated today.  Please have your blood pressure checked several times over the next 1-2 weeks to make sure that it is coming down outside of the stress of being in the hospital.  If your blood pressure is consistently elevated, you will need to be on medication to control it.  Inadequately controlled high blood pressure can lead to heart attacks, strokes, kidney failure.

## 2023-12-16 NOTE — ED Provider Notes (Signed)
 [place] EMERGENCY DEPARTMENT AT [facility] Provider Note   CSN: [id number] Arrival date & time: 12/16/23  2135     Patient presents with: Flank Pain and Chest Pain   Sharon Bennett is a 43 y.o. female.   The history is provided by the patient.  Flank Pain Associated symptoms include chest pain.  Chest Pain  She has history of colorectal cancer, GERD and comes in with several complaints.  She has been having pain in the right flank for at least a month, possibly more.  Pain starts in the right flank along the lower rib cage and does radiate around to the right upper abdomen/lower rib cage.  It is worse when she lays on that side and worse when she twists or turns.  She has not taken anything for it.  Symptoms have been stable over time.  However, she also endorses increased belching and early satiety.  These are symptoms that she had prior to her diagnosis of colorectal cancer and had resolved following treatment.  She denies epigastric pain, nausea, vomiting.  Another complaint is CT scan obtained at another facility showed a lesion in her liver and she is wondering whether an MRI scan should be obtained.  She also had her CEA level and it has increased over its baseline.  She expresses extreme concern that her gastroenterologist is only recommending colonoscopy every 3 years, feels that it needs to be done more often.    Prior to Admission medications   Medication Sig Start Date End Date Taking? Authorizing Provider  amitriptyline  (ELAVIL ) 25 MG tablet TAKE 1 TABLET BY MOUTH AT BEDTIME Patient not taking: Reported on 07/25/2023 04/22/23   Cloretta Arley NOVAK, MD  ASHWAGANDHA PO Take 1 tablet by mouth as needed.    [provider]  clindamycin  (CLEOCIN -T) 1 % lotion Apply topically daily. 10/10/23 10/09/24  Paci, Karina M, MD  cyclobenzaprine  (FLEXERIL ) 10 MG tablet Take 1 tablet (10 mg total) by mouth daily as needed. DO NOT DRIVE 3/83/74   Cloretta Arley NOVAK, MD   diclofenac  (VOLTAREN ) 75 MG EC tablet TAKE 1 TABLET BY MOUTH 2 TIMES DAILY AS NEEDED. 11/21/21   Vernetta Lonni GRADE, MD  fexofenadine (ALLEGRA) 180 MG tablet Take 180 mg by mouth daily as needed for allergies or rhinitis.    [provider]  gabapentin  (NEURONTIN ) 300 MG capsule Take 1 capsule (300 mg total) by mouth at bedtime. 10/22/22   Debby Olam POUR, NP  loperamide  (IMODIUM ) 2 MG capsule Take 1 capsule (2 mg total) by mouth 2 (two) times daily as needed for diarrhea or loose stools. 05/14/21   Tanda Locus, MD  pantoprazole  (PROTONIX ) 40 MG tablet Take 1 tablet (40 mg total) by mouth daily. 07/16/21   de [place], Quintin PARAS, MD  propranolol  (INDERAL ) 20 MG tablet Take 1 tablet (20 mg total) by mouth 2 (two) times daily as needed (heart rate at rest >120bpm, palpitations). 07/14/21   Walker, Caitlin S, NP  triamcinolone  cream (KENALOG ) 0.1 % Apply 1 Application topically 2 (two) times daily. Apply 2 times daily for 2 weeks then STOP if not resolved , restart after 2 weeks. Continue to apply 2 weeks on and taking a break for 2 week until follow up visit 08/07/23   Corey Rufus HERO, MD  TRULANCE 3 MG TABS Take 3 mg by mouth daily at 6 (six) AM. 05/15/23   [provider]    Allergies: Codeine, Dilaudid  [hydromorphone ], Mushroom extract complex (obsolete), Oxycodone ,  and Tegaderm ag mesh [silver ]    Review of Systems  Cardiovascular:  Positive for chest pain.  Genitourinary:  Positive for flank pain.  All other systems reviewed and are negative.   Updated Vital Signs BP (!) 159/116   Pulse (!) 108   Temp 98 F (36.7 C) (Oral)   Resp 19   SpO2 98%   Physical Exam Vitals and nursing note reviewed.   44 year old female, resting comfortably and in no acute distress. Vital signs are significant for elevated blood pressure. Oxygen saturation is 100%, which is normal. Head is normocephalic and atraumatic. PERRLA, EOMI. Oropharynx is clear. Neck is nontender and supple without  adenopathy. Back is nontender in the midline.  There is mild tenderness along the right lower costal margin posteriorly and laterally, there is no crepitus. Lungs are clear without rales, wheezes, or rhonchi. Chest is nontender. Heart has regular rate and rhythm without murmur. Abdomen is soft, flat, nontender. Skin is warm and dry without rash. Neurologic: Mental status is normal, cranial nerves are intact, moves all extremities equally.  (all labs ordered are listed, but only abnormal results are displayed) Labs Reviewed  COMPREHENSIVE METABOLIC PANEL WITH GFR - Abnormal; Notable for the following components:      Result Value   Potassium 3.2 (*)    All other components within normal limits  CBC  URINALYSIS, ROUTINE W REFLEX MICROSCOPIC  HCG, SERUM, QUALITATIVE  TROPONIN T, HIGH SENSITIVITY    EKG: EKG Interpretation Date/Time:  Monday December 16 2023 21:43:53 EST Ventricular Rate:  112 PR Interval:  156 QRS Duration:  88 QT Interval:  315 QTC Calculation: 430 R Axis:   89  Text Interpretation: Sinus tachycardia Right atrial enlargement Borderline repolarization abnormality Baseline wander in lead(s) V6 When compared with ECG of 12/31/2022, No significant change was found Confirmed by Raford Lenis ([id number]) on 12/16/2023 10:42:30 PM  Radiology: No results found.   Procedures   Medications Ordered in the ED - No data to display                                  Medical Decision Making Amount and/or Complexity of Data Reviewed Labs: ordered.  Risk Prescription drug management.   Right flank pain which seems to be musculoskeletal based on pain with movement and tenderness.  I see no red flags to suggest more serious pathology.  No rash to suggest herpes zoster.  Recent CT scan showed no nephrolithiasis or urolithiasis.  Early satiety and belching which likely is recurrence of GERD versus peptic ulcer disease.  I have reviewed her past records, and do note CEA on  12/16/2023 was 1.02, all prior CEA's have been less than 1.00 except on her presentation with cancer at which time it was 1.48.  Because the exact value of prior CEA's is not known, I cannot tell if this is a significant increase or not.  CT abdomen and pelvis done 12/12/2023 at [facility] is reported to show a 4 mm subtle hypodensity in the dome of the liver, also small amount of nonspecific pelvic fluid slightly increased from prior study.  Unfortunately, these images are not available for me to view, but I doubt that they are significant.  Prior CT of abdomen and pelvis on 01/19/2023 made no mention of any liver lesions.  I have reviewed her laboratory tests, my interpretation is normal CBC, normal troponin, mild hypokalemia, normal transaminases and  alkaline phosphatase and bilirubin, no UTI.  I have explained to the patient that I do not see any evidence of serious pathology at this point, but I understand her concerns given her history.  I am ordering a dose of oral potassium and I am discharging her with prescriptions for potassium and pantoprazole .  I have recommended that she use over-the-counter NSAIDs and acetaminophen  as needed for her flank pain.  I am referring her back to her gastroenterologist and oncologist to try to address her other concerns about belching, early satiety, liver lesion, increased nonspecific pelvic fluid, rising CEA.     Final diagnoses:  Flank pain, right side  Elevated blood pressure reading without diagnosis of hypertension  Hypokalemia  Liver lesion    ED Discharge Orders          Ordered    pantoprazole  (PROTONIX ) 40 MG tablet  Daily        12/16/23 2331    potassium chloride  SA (KLOR-CON  M) 20 MEQ tablet  2 times daily        12/16/23 2351               Raford Lenis, MD 12/17/23 0004

## 2023-12-16 NOTE — ED Triage Notes (Signed)
 Patient c/o on and off right flank pain x 1 month and chest pain tonight. Patient states she is anxious d/t her CT scan last week and might cause her chest pain tonight. Patient denies N/V. Patient denies dysuria.

## 2023-12-17 ENCOUNTER — Telehealth: Payer: Self-pay | Admitting: *Deleted

## 2023-12-17 ENCOUNTER — Encounter: Payer: Self-pay | Admitting: Oncology

## 2023-12-17 DIAGNOSIS — K769 Liver disease, unspecified: Secondary | ICD-10-CM | POA: Diagnosis not present

## 2023-12-17 LAB — TROPONIN T, HIGH SENSITIVITY: Troponin T High Sensitivity: <15 ng/L (ref 0–19)

## 2023-12-17 NOTE — Telephone Encounter (Signed)
 Sharon Bennett has been having persistent right flank pain and was seen at [facility] with CT scan and again in ER on 11/10. She is concerned her cancer has returned. CT scan report from [facility] reviewed by Dr. Cloretta as well as her recent [facility] test showed negative for cancer DNA. CEA is in normal range as well. Dr. Cloretta is not concerned. Encouraged her to reach out to her GI provider as the emergency room suggested due to some other GI symptoms she was having. CD has been uploaded by radiology and MD notified it is ready to review.

## 2023-12-25 ENCOUNTER — Other Ambulatory Visit: Payer: Self-pay | Admitting: *Deleted

## 2023-12-25 NOTE — Progress Notes (Signed)
 The proposed treatment discussed in conference is for discussion purpose only and is not a binding recommendation.  The patients have not been physically examined, or presented with their treatment options.  Therefore, final treatment plans cannot be decided.

## 2023-12-26 ENCOUNTER — Telehealth: Payer: Self-pay | Admitting: *Deleted

## 2023-12-26 NOTE — Telephone Encounter (Signed)
 Informed Sharon Bennett that GI conference discussed her case. It is thought that the liver lesion is likely benign. Radiology suggests a MRI abdomen/pelvis w/contrast in 6 months. Will still have the CT of her chest on 12/13 and see Dr. Cloretta as scheduled on 12/18. She agrees to this plan. Reports she is still having left abdominal discomfort. Dr. Rollin put her on prednisone  for few days w/no improvement.

## 2024-01-07 ENCOUNTER — Inpatient Hospital Stay

## 2024-01-13 ENCOUNTER — Other Ambulatory Visit

## 2024-01-18 ENCOUNTER — Ambulatory Visit (HOSPITAL_BASED_OUTPATIENT_CLINIC_OR_DEPARTMENT_OTHER): Admission: RE | Admit: 2024-01-18 | Discharge: 2024-01-18 | Attending: Oncology | Admitting: Oncology

## 2024-01-18 DIAGNOSIS — C20 Malignant neoplasm of rectum: Secondary | ICD-10-CM

## 2024-01-18 MED ORDER — IOHEXOL 300 MG/ML  SOLN
100.0000 mL | Freq: Once | INTRAMUSCULAR | Status: AC | PRN
  Administered 2024-01-18: 100 mL via INTRAVENOUS

## 2024-01-23 ENCOUNTER — Inpatient Hospital Stay
Admission: RE | Admit: 2024-01-23 | Discharge: 2024-01-23 | Disposition: A | Payer: Self-pay | Source: Ambulatory Visit | Attending: Oncology | Admitting: Oncology

## 2024-01-23 ENCOUNTER — Inpatient Hospital Stay: Attending: Oncology | Admitting: Oncology

## 2024-01-23 ENCOUNTER — Other Ambulatory Visit: Payer: Self-pay | Admitting: *Deleted

## 2024-01-23 ENCOUNTER — Other Ambulatory Visit

## 2024-01-23 VITALS — BP 114/80 | HR 86 | Temp 98.2°F | Resp 18 | Ht 66.0 in | Wt 163.6 lb

## 2024-01-23 DIAGNOSIS — C20 Malignant neoplasm of rectum: Secondary | ICD-10-CM | POA: Insufficient documentation

## 2024-01-23 DIAGNOSIS — K5900 Constipation, unspecified: Secondary | ICD-10-CM | POA: Diagnosis not present

## 2024-01-23 LAB — BASIC METABOLIC PANEL - CANCER CENTER ONLY
Anion gap: 8 (ref 5–15)
BUN: 9 mg/dL (ref 6–20)
CO2: 29 mmol/L (ref 22–32)
Calcium: 9.5 mg/dL (ref 8.9–10.3)
Chloride: 104 mmol/L (ref 98–111)
Creatinine: 0.66 mg/dL (ref 0.44–1.00)
GFR, Estimated: >60 mL/min (ref >60)
Glucose, Bld: 89 mg/dL (ref 70–99)
Potassium: 3.9 mmol/L (ref 3.5–5.1)
Sodium: 142 mmol/L (ref 135–145)

## 2024-01-23 NOTE — Progress Notes (Addendum)
 [facility] OFFICE PROGRESS NOTE   Diagnosis: Rectal cancer  INTERVAL HISTORY:   Sharon Bennett returns as scheduled.  She developed discomfort at the right lateral abdomen/flank last month.  A CT abdomen/pelvis at [facility] revealed an indeterminate 4 mm liver lesion.  She continues to have discomfort in this area.  No explanation for the pain has been identified.  An MRI of the abdomen/pelvis 01/16/2024 revealed no liver lesion.  She has constipation.  She has a bowel movement when she takes Trulance.  She continues to have an anal fissure.  No bleeding.  Good appetite.  She has persistent mild numbness in the hands and feet.  Gabapentin  helps the tingling in the feet at night.  Objective:  Vital signs in last 24 hours:  Blood pressure 114/80, pulse 86, temperature 98.2 F (36.8 C), temperature source Temporal, resp. rate 18, height 5' 6 (1.676 m), weight 163 lb 9.6 oz (74.2 kg), SpO2 100%.   Lymphatics: No cervical, supraclavicular, axillary, or inguinal nodes Resp: Lungs clear bilaterally Cardio: Regular rate and rhythm GI: No hepatosplenomegaly, nontender, no mass Vascular: No leg edema   Lab Results:  Lab Results  Component Value Date   WBC 5.2 12/16/2023   HGB 13.2 12/16/2023   HCT 41.0 12/16/2023   MCV 88.6 12/16/2023   PLT 242 12/16/2023   NEUTROABS 2.6 01/23/2023    CMP  Lab Results  Component Value Date   NA 142 01/23/2024   K 3.9 01/23/2024   CL 104 01/23/2024   CO2 29 01/23/2024   GLUCOSE 89 01/23/2024   BUN 9 01/23/2024   CREATININE 0.66 01/23/2024   CALCIUM  9.5 01/23/2024   PROT 7.8 12/16/2023   ALBUMIN  4.6 12/16/2023   AST 27 12/16/2023   ALT 14 12/16/2023   ALKPHOS 106 12/16/2023   BILITOT 0.5 12/16/2023   GFRNONAA >60 01/23/2024    Lab Results  Component Value Date   CEA 1.02 12/16/2023    Medications: I have reviewed the patient's current medications.   Assessment/Plan: Rectal cancer  CT abdomen/pelvis 08/21/2020-large  rectal mass measuring 4.4 x 2.6 x 5.5 cm, haziness in the mesorectal fat, obscuration of the fat plane between the mass and the adjacent posterior aspect of the uterus, numerous prominent mesorectal lymph nodes measuring up to 5 mm Colonoscopy 08/23/2020-fungating infiltrative nonobstructing large mass found in the rectum.  Mass was noncircumferential, measured 5 cm in length, 3 cm in diameter and was 7 cm from the anal verge.  No bleeding was present.   Pathology showed invasive colonic adenocarcinoma, moderately differentiated.  No evidence of mismatch repair deficiency. Chest CT 09/01/2020-no evidence of metastatic disease MRI pelvis 09/02/2020-tumor at 10.4 cm from anal verge, 5.9 cm from the internal anal sphincter, T3, approximately 15 greater than 5 mm lymph nodes, N2b, multiple uterine fibroids Cycle 1 FOLFOX 09/15/2020 Cycle 2 FOLFOX 09/29/2020 Cycle 3 FOLFOX 10/13/2020 Cycle 4 FOLFOX 10/27/2020 Cycle 5 FOLFOX 11/10/2020 Cycle 6 FOLFOX 11/24/2020 Cycle 7 FOLFOX 12/08/2020 Cycle 8 FOLFOX 01/05/2021, Ziextenzo  Radiation/Xeloda  01/23/2021-03/03/2021 Robotic assisted low anterior resection/loop ileostomy 05/10/2021-invasive adenocarcinoma, tumor extends into muscularis, negative resection margins, 0/19 nodes, grade 2, no lymphovascular perineural invasion, normal mismatch repair protein expression,[id number] Ileostomy takedown 07/31/2021 CTs 08/25/2021-no evidence of metastatic disease 08/29/2021 guardant reveal-negative Colonoscopy 11/07/2021: Healthy appearing mucosa at the anastomosis 7 cm from the anal verge, no specimen collected 11/29/2021 guardant reveal negative CTs 01/17/2022-no findings of recurrent or metastatic disease, 7 mm right renal lesion  CT 07/20/2022-no evidence of recurrent disease in  the chest, abdomen, or pelvis CTs 01/19/2023-no evidence of recurrent disease 04/09/2023 sigmoidoscopy: Anal fissure 06/10/2023 guardant reveal-negative 11/30/2023 guardant reveal-negative 12/12/2023 CT  abdomen/pelvis: 4 mm hypodensity in the dome of the liver-too small to characterize, small amount of pelvic fluid-slightly increased from December 2024 01/16/2024 MRI abdomen/pelvis: Small amount of pelvic free fluid-likely physiologic, fibroid uterus, no liver lesions 01/18/2024 CT chest: No metastatic disease 2.   rectal bleeding, change in bowel habits secondary to #1 3.   Uterine fibroids noted on MRI pelvis 09/02/2020 4.   Port-A-Cath placement 09/09/2020, Interventional Radiology, Port-A-Cath removed 04/04/2021 5.  Admission 12/14/2020 with febrile neutropenia, E. coli bacteremia-completed an outpatient course of Augmentin  6.  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  Right upper extremity pain-03/31/2021 MRI cervical spine-no impingement, inflammation or mass to explain symptoms; disc bulging and ridging at C4-5 to C6-7.  C6-7 posterior cervical laminotomy and foraminotomy and microdiscectomy 04/17/2021 8.  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  We will follow-up on the guardant reveal from today.  She has undergone multiple imaging studies over the past few months and there is no evidence of recurrent rectal cancer.  She will continue colonoscopy surveillance with Dr. Rollin.  She we will schedule appoint with Dr. Teresa to evaluate the anal fissure.  Sharon Bennett will return for an office and lab visit in 6 months.  Arley Hof, MD  01/23/2024  8:53 AM

## 2024-03-11 ENCOUNTER — Ambulatory Visit: Admitting: Dermatology

## 2024-07-23 ENCOUNTER — Inpatient Hospital Stay: Admitting: Oncology

## 2024-07-23 ENCOUNTER — Inpatient Hospital Stay
